# Patient Record
Sex: Male | Born: 1960 | Race: Black or African American | Hispanic: No | Smoking: Current every day smoker
Health system: Southern US, Community
[De-identification: ages and names within clinical notes are randomized; demographics above are authoritative.]

## PROBLEM LIST (undated history)

## (undated) DIAGNOSIS — G959 Disease of spinal cord, unspecified: Secondary | ICD-10-CM

## (undated) DIAGNOSIS — R945 Abnormal results of liver function studies: Secondary | ICD-10-CM

## (undated) DIAGNOSIS — K859 Acute pancreatitis without necrosis or infection, unspecified: Secondary | ICD-10-CM

## (undated) DIAGNOSIS — N281 Cyst of kidney, acquired: Secondary | ICD-10-CM

## (undated) DIAGNOSIS — K824 Cholesterolosis of gallbladder: Secondary | ICD-10-CM

## (undated) DIAGNOSIS — R4020 Unspecified coma: Secondary | ICD-10-CM

## (undated) DIAGNOSIS — F1010 Alcohol abuse, uncomplicated: Secondary | ICD-10-CM

## (undated) DIAGNOSIS — R7989 Other specified abnormal findings of blood chemistry: Secondary | ICD-10-CM

## (undated) HISTORY — DX: Abnormal results of liver function studies: R94.5

## (undated) HISTORY — DX: Other specified abnormal findings of blood chemistry: R79.89

## (undated) HISTORY — PX: NO PAST SURGERIES: SHX2092

---

## 1998-06-02 ENCOUNTER — Emergency Department (HOSPITAL_COMMUNITY): Admission: EM | Admit: 1998-06-02 | Discharge: 1998-06-02 | Payer: Self-pay | Admitting: Emergency Medicine

## 1998-06-02 ENCOUNTER — Encounter: Payer: Self-pay | Admitting: Emergency Medicine

## 1999-12-15 ENCOUNTER — Encounter: Payer: Self-pay | Admitting: Emergency Medicine

## 1999-12-15 ENCOUNTER — Emergency Department (HOSPITAL_COMMUNITY): Admission: EM | Admit: 1999-12-15 | Discharge: 1999-12-15 | Payer: Self-pay | Admitting: Emergency Medicine

## 2004-04-25 ENCOUNTER — Emergency Department (HOSPITAL_COMMUNITY): Admission: EM | Admit: 2004-04-25 | Discharge: 2004-04-25 | Payer: Self-pay | Admitting: Family Medicine

## 2004-06-22 ENCOUNTER — Emergency Department (HOSPITAL_COMMUNITY): Admission: EM | Admit: 2004-06-22 | Discharge: 2004-06-22 | Payer: Self-pay

## 2005-07-18 ENCOUNTER — Emergency Department (HOSPITAL_COMMUNITY): Admission: EM | Admit: 2005-07-18 | Discharge: 2005-07-18 | Payer: Self-pay | Admitting: Emergency Medicine

## 2007-04-27 ENCOUNTER — Inpatient Hospital Stay (HOSPITAL_COMMUNITY): Admission: EM | Admit: 2007-04-27 | Discharge: 2007-04-30 | Payer: Self-pay | Admitting: Emergency Medicine

## 2007-12-22 ENCOUNTER — Inpatient Hospital Stay (HOSPITAL_COMMUNITY): Admission: EM | Admit: 2007-12-22 | Discharge: 2007-12-25 | Payer: Self-pay | Admitting: Emergency Medicine

## 2007-12-22 ENCOUNTER — Ambulatory Visit: Payer: Self-pay | Admitting: Internal Medicine

## 2009-07-01 ENCOUNTER — Inpatient Hospital Stay (HOSPITAL_COMMUNITY): Admission: EM | Admit: 2009-07-01 | Discharge: 2009-07-02 | Payer: Self-pay | Admitting: Emergency Medicine

## 2009-07-04 ENCOUNTER — Emergency Department (HOSPITAL_COMMUNITY): Admission: EM | Admit: 2009-07-04 | Discharge: 2009-07-05 | Payer: Self-pay | Admitting: Emergency Medicine

## 2010-01-25 ENCOUNTER — Emergency Department (HOSPITAL_COMMUNITY): Admission: EM | Admit: 2010-01-25 | Discharge: 2010-01-25 | Payer: Self-pay | Admitting: Emergency Medicine

## 2010-02-19 ENCOUNTER — Emergency Department (HOSPITAL_COMMUNITY): Admission: EM | Admit: 2010-02-19 | Discharge: 2010-02-20 | Payer: Self-pay | Admitting: Emergency Medicine

## 2010-07-12 ENCOUNTER — Inpatient Hospital Stay (HOSPITAL_COMMUNITY): Admission: EM | Admit: 2010-07-12 | Discharge: 2010-07-14 | Payer: Self-pay | Admitting: Emergency Medicine

## 2010-07-12 DIAGNOSIS — K859 Acute pancreatitis without necrosis or infection, unspecified: Secondary | ICD-10-CM

## 2010-07-12 DIAGNOSIS — F1010 Alcohol abuse, uncomplicated: Secondary | ICD-10-CM

## 2010-07-12 DIAGNOSIS — E871 Hypo-osmolality and hyponatremia: Secondary | ICD-10-CM | POA: Insufficient documentation

## 2010-07-13 LAB — CONVERTED CEMR LAB
Calcium: 8.7 mg/dL
Creatinine, Ser: 0.61 mg/dL
HDL: 56 mg/dL
Hemoglobin: 13.2 g/dL
LDL Cholesterol: 61 mg/dL
Platelets: 238 10*3/uL
RDW: 12.9 %
Total CHOL/HDL Ratio: 2.3
Triglycerides: 56 mg/dL
VLDL: 11 mg/dL
WBC: 5.3 10*3/uL

## 2010-07-14 ENCOUNTER — Encounter (INDEPENDENT_AMBULATORY_CARE_PROVIDER_SITE_OTHER): Payer: Self-pay | Admitting: Nurse Practitioner

## 2010-07-28 ENCOUNTER — Ambulatory Visit: Payer: Self-pay | Admitting: Nurse Practitioner

## 2010-07-28 DIAGNOSIS — G589 Mononeuropathy, unspecified: Secondary | ICD-10-CM | POA: Insufficient documentation

## 2010-07-28 DIAGNOSIS — F17200 Nicotine dependence, unspecified, uncomplicated: Secondary | ICD-10-CM | POA: Insufficient documentation

## 2010-07-28 DIAGNOSIS — E876 Hypokalemia: Secondary | ICD-10-CM

## 2010-07-28 DIAGNOSIS — F1410 Cocaine abuse, uncomplicated: Secondary | ICD-10-CM | POA: Insufficient documentation

## 2010-07-28 LAB — CONVERTED CEMR LAB
ALT: 19 units/L (ref 0–53)
Amylase: 73 units/L (ref 0–105)
BUN: 14 mg/dL (ref 6–23)
Cholesterol, target level: 200 mg/dL
Creatinine, Ser: 0.92 mg/dL (ref 0.40–1.50)
HDL goal, serum: 40 mg/dL
LDL Goal: 130 mg/dL
Lipase: 90 units/L — ABNORMAL HIGH (ref 0–75)
PSA: 0.36 ng/mL (ref 0.10–4.00)
Sodium: 140 meq/L (ref 135–145)
Total Bilirubin: 0.5 mg/dL (ref 0.3–1.2)

## 2010-08-02 ENCOUNTER — Encounter (INDEPENDENT_AMBULATORY_CARE_PROVIDER_SITE_OTHER): Payer: Self-pay | Admitting: Nurse Practitioner

## 2010-11-09 NOTE — Letter (Signed)
Summary: Discharge Summary  Discharge Summary   Imported By: Arta Bruce 07/20/2010 12:23:10  _____________________________________________________________________  External Attachment:    Type:   Image     Comment:   External Document

## 2010-11-09 NOTE — Letter (Signed)
Summary: Handout Printed  Printed Handout:  - Pancreatitis, Acute 

## 2010-11-09 NOTE — Letter (Signed)
Summary: *HSN Results Follow up  [facility]  [address] [place], [place] [address]   Phone: [phone]  Fax: [phone]      08/02/2010   [name] [address] [place], [place]  [address]   Dear  [name],                            ____S.Drinkard,FNP   ____D. Gore,FNP       ____B. McPherson,MD   ____V. Rankins,MD    ____E. Mulberry,MD    __X__N. Daphine Deutscher, FNP  ____D. Reche Dixon, MD    ____K. Philipp Deputy, MD    ____Other     This letter is to inform you that your recent test(s):  _______Pap Smear    ___X____Lab Test     _______X-ray     Comments:  Labs done during recent office visit show that your lipase is still just a little elevated.  However this is much improved from when you were in the hospital.         _________________________________________________________ If you have any questions, please contact our office [phone].                    Sincerely,    Lehman Prom FNP [facility]

## 2010-11-09 NOTE — Letter (Signed)
Summary: PT INFORMATION SHEET  PT INFORMATION SHEET   Imported By: Arta Bruce 07/29/2010 10:03:16  _____________________________________________________________________  External Attachment:    Type:   Image     Comment:   External Document

## 2010-11-09 NOTE — Assessment & Plan Note (Signed)
Summary: NEW - Establish Care   Vital Signs:  Patient profile:   50 year old male Height:      65.50 inches Weight:      129.7 pounds BMI:     21.33 Temp:     97.1 degrees F oral Pulse rate:   72 / minute Pulse rhythm:   regular Resp:     16 per minute BP sitting:   110 / 70  (left arm) Cuff size:   regular  Vitals Entered By: Levon Hedger (July 28, 2010 2:28 PM) CC: follow-up visit [place], Lipid Management Is Patient Diabetic? No Pain Assessment Patient in pain? no       Does patient need assistance? Functional Status Self care Ambulation Normal   CC:  follow-up visit [place] and Lipid Management.  History of Present Illness:  Pt into the office to establish care. No previous [facility]  No signficant PMH No PSH  Recent hospitalization from 07/12/2010 to 07/14/2010 [facility] notes reviewed) Hx of pancreatitis and ETOH abuse who presented to [facility] with abdominal pain 2 weeks prior to admission.  Workup in ED revealed lipase of 1625 and decreased 464.  Pt presents today with medications from the hospital  1.  Acute pancreatitis secondary to ETOH consumption/dependency - pt was admitted and started on IVF, clear liquids and pain medications.  Lipase trended down.  Pt was able to tolerate a regular diet at time of discharge  2.  Hypokalemia and Hyponatremia - probably secondary to alcohol consumption.  pt was hydrated and repleted  3.  ETOH dependence - pt was monitored for delirium and withdrawl.  He was placed on thiamine, MVI and ativan protocal.  Pt did not go through withdrawls.  4. Cocaine use - Urine drug screen positive for cocaine use.    Lipid Management History:      Positive NCEP/ATP III risk factors include male age 65 years old or older and current tobacco user.     Habits & Providers  Alcohol-Tobacco-Diet     Alcohol drinks/day: 2     Alcohol Counseling: to decrease amount and/or frequency of alcohol intake     Alcohol type: beer  Tobacco Status: current     Tobacco Counseling: to quit use of tobacco products     Cigarette Packs/Day: 1/2 pack a day     Year Started: age 52  Exercise-Depression-Behavior     Drug Use: cocaine  Comments: ETOH has decreased since hospitalization on 07/12/2010  Current Medications (verified): 1)  Folic Acid 1 Mg Tabs (Folic Acid) .... Take One Tablet By Mouth Every Day *ripudeep Rap,md 2)  Promethazine Hcl 12.5 Mg Tabs (Promethazine Hcl) .... Take One Tablet By Mouth Every 6 Hours As Needed *ripudeep Rai, Md 3)  Thiamine Hcl 100 Mg Tabs (Thiamine Hcl) .... Take One Tablet By Mouth Everyday *ripudeep Rai, Md 4)  Oxycodone-Acetaminophen 5-325 Mg Tabs (Oxycodone-Acetaminophen) .... Take One Tablet By Mouth Every 6 Hours As Needed *ripudeep Rai,md 5)  Lorazepam 1 Mg Tabs (Lorazepam) .... Take One Tablet By Mouth Every 6 Hours As Needed For Anxiety *ripudeep Rai, Md 6)  Px Complete Senior Multivits  Tabs (Multiple Vitamins-Minerals) .... Take One Tablet By Mouth Every Day  Allergies (verified): No Known Drug Allergies  Family History: mother - diabetes father -  (deceased from MI) brother - diabetes  Social History: 2 children tobacco - 1/2ppd ETOH - 2 beers per day Drug - hx cocaine useSmoking Status:  current Packs/Day:  1/2 pack  a day Drug Use:  cocaine  Review of Systems General:  Denies fever. CV:  Denies chest pain or discomfort. Resp:  Denies cough. GI:  Denies abdominal pain, nausea, and vomiting. Neuro:  Complains of tingling; hands and feet.  Physical Exam  General:  alert.   Head:  normocephalic.   Eyes:  glasses Lungs:  normal breath sounds.   Heart:  normal rate and regular rhythm.   Abdomen:  non-tender and normal bowel sounds.     Impression & Recommendations:  Problem # 1:  Hosp for ACUTE PANCREATITIS (ICD-577.0) will check amylase and lipase today pt advised of the dx and why he should decrease his drinking  Problem # 2:  ALCOHOL ABUSE  (ICD-305.00) advised pt to decrease drinking as this is what causes pancreatitis to reoccur Orders: T-Amylase (16606-30160) T-Lipase (10932-35573) T-PSA (22025-42706)  Problem # 3:  TOBACCO ABUSE (ICD-305.1) advised cessation  Problem # 4:  NEED PROPHYLACTIC VACCINATION&INOCULATION FLU (ICD-V04.81) will given today  Problem # 5:  NEUROPATHY (ICD-355.9) likely from chronic ETOH abuse  Complete Medication List: 1)  Folic Acid 1 Mg Tabs (Folic acid) .... Take one tablet by mouth every day *ripudeep rap,md 2)  Thiamine Hcl 100 Mg Tabs (Thiamine hcl) .... Take one tablet by mouth everyday *ripudeep rai, md 3)  Px Complete Senior Multivits Tabs (Multiple vitamins-minerals) .... Take one tablet by mouth every day  Other Orders: T-Comprehensive Metabolic Panel (23762-83151) Flu Vaccine 83yrs + (76160) Admin 1st Vaccine (73710)  Lipid Assessment/Plan:      Based on NCEP/ATP III, the patient's risk factor category is "0-1 risk factors".  The patient's lipid goals are as follows: Total cholesterol goal is 200; LDL cholesterol goal is 130; HDL cholesterol goal is 40; Triglyceride goal is 150.    Patient Instructions: 1)  You have been given the flu vaccine today. 2)  You will be notified of any abnormal lab results. 3)  Keep up your efforts to limit your alcohol use.  This is the cause for lots of your chronic issues including numbness and tingling in your arms and legs. 4)  Be sure to get an "orange" card so you can continue to be seen here 5)  Continue to take your medications as ordered - vitamins 6)  Follow up as needed   Orders Added: 1)  Est. Patient Level III [62694] 2)  T-Comprehensive Metabolic Panel [80053-22900] 3)  T-Amylase [82150-23210] 4)  T-Lipase [83690-23215] 5)  T-PSA [85462-70350] 6)  Flu Vaccine 82yrs + [90658] 7)  Admin 1st Vaccine [90471]   Immunizations Administered:  Influenza Vaccine # 1:    Vaccine Type: Fluvax 3+    Site: right deltoid    Mfr:  [email or web address]    Dose: 0.5 ml    Route: IM    Given by: Levon Hedger    Exp. Date: 04/09/2011    Lot #: [id number]    VIS given: 05/04/10 version given July 28, 2010.  Flu Vaccine Consent Questions:    Do you have a history of severe allergic reactions to this vaccine? no    Any prior history of allergic reactions to egg and/or gelatin? no    Do you have a sensitivity to the preservative Thimersol? no    Do you have a past history of Guillan-Barre Syndrome? no    Do you currently have an acute febrile illness? no    Have you ever had a severe reaction to latex? no    Vaccine information given and explained to  patient? yes ndc  [phone]  Immunizations Administered:  Influenza Vaccine # 1:    Vaccine Type: Fluvax 3+    Site: right deltoid    Mfr: [email or web address]    Dose: 0.5 ml    Route: IM    Given by: Levon Hedger    Exp. Date: 04/09/2011    Lot #: [id number]    VIS given: 05/04/10 version given July 28, 2010.  Prevention & Chronic Care Immunizations   Influenza vaccine: Fluvax 3+  (07/28/2010)    Tetanus booster: Not documented    Pneumococcal vaccine: Not documented  Colorectal Screening   Hemoccult: Not documented    Colonoscopy: Not documented  Other Screening   PSA: Not documented   PSA ordered.   Smoking status: current  (07/28/2010)  Lipids   Total Cholesterol: 128  (07/13/2010)   LDL: 61  (07/13/2010)   LDL Direct: Not documented   HDL: 56  (07/13/2010)   Triglycerides: 56  (07/13/2010)   Nursing Instructions: Give Flu vaccine today

## 2010-12-23 LAB — BASIC METABOLIC PANEL
BUN: 3 mg/dL — ABNORMAL LOW (ref 6–23)
Creatinine, Ser: 0.61 mg/dL (ref 0.4–1.5)

## 2010-12-23 LAB — RAPID URINE DRUG SCREEN, HOSP PERFORMED
Amphetamines: NOT DETECTED
Barbiturates: NOT DETECTED
Benzodiazepines: NOT DETECTED
Tetrahydrocannabinol: NOT DETECTED

## 2010-12-23 LAB — LIPID PANEL
Cholesterol: 128 mg/dL (ref 0–200)
LDL Cholesterol: 61 mg/dL (ref 0–99)
Triglycerides: 56 mg/dL (ref <150)

## 2010-12-23 LAB — DIFFERENTIAL
Basophils Relative: 0 % (ref 0–1)
Eosinophils Absolute: 0 10*3/uL (ref 0.0–0.7)
Eosinophils Relative: 0 % (ref 0–5)
Lymphocytes Relative: 15 % (ref 12–46)
Lymphs Abs: 1.2 10*3/uL (ref 0.7–4.0)
Monocytes Absolute: 0.8 10*3/uL (ref 0.1–1.0)
Monocytes Relative: 10 % (ref 3–12)
Neutro Abs: 5.9 10*3/uL (ref 1.7–7.7)
Neutrophils Relative %: 75 % (ref 43–77)

## 2010-12-23 LAB — CBC
HCT: 40.2 % (ref 39.0–52.0)
Hemoglobin: 14.2 g/dL (ref 13.0–17.0)
MCH: 33 pg (ref 26.0–34.0)
MCHC: 34.9 g/dL (ref 30.0–36.0)
MCV: 93.5 fL (ref 78.0–100.0)
Platelets: 238 10*3/uL (ref 150–400)
RBC: 4.3 MIL/uL (ref 4.22–5.81)

## 2010-12-23 LAB — LIPASE, BLOOD: Lipase: 464 U/L — ABNORMAL HIGH (ref 11–59)

## 2010-12-23 LAB — COMPREHENSIVE METABOLIC PANEL
AST: 38 U/L — ABNORMAL HIGH (ref 0–37)
Albumin: 3.9 g/dL (ref 3.5–5.2)
Alkaline Phosphatase: 44 U/L (ref 39–117)
CO2: 23 mEq/L (ref 19–32)
Calcium: 8.6 mg/dL (ref 8.4–10.5)
Sodium: 128 mEq/L — ABNORMAL LOW (ref 135–145)

## 2010-12-28 LAB — CBC
Hemoglobin: 15.1 g/dL (ref 13.0–17.0)
MCHC: 35.2 g/dL (ref 30.0–36.0)
MCV: 93.5 fL (ref 78.0–100.0)
RBC: 4.6 MIL/uL (ref 4.22–5.81)

## 2010-12-28 LAB — COMPREHENSIVE METABOLIC PANEL
AST: 36 U/L (ref 0–37)
CO2: 24 mEq/L (ref 19–32)
Creatinine, Ser: 0.7 mg/dL (ref 0.4–1.5)
GFR calc Af Amer: >60 mL/min (ref >60)
Glucose, Bld: 110 mg/dL — ABNORMAL HIGH (ref 70–99)
Potassium: 3.9 mEq/L (ref 3.5–5.1)
Total Protein: 8.1 g/dL (ref 6.0–8.3)

## 2010-12-28 LAB — POCT I-STAT, CHEM 8
BUN: 7 mg/dL (ref 6–23)
Calcium, Ion: 1.11 mmol/L — ABNORMAL LOW (ref 1.12–1.32)
Creatinine, Ser: 0.9 mg/dL (ref 0.4–1.5)
Hemoglobin: 15.6 g/dL (ref 13.0–17.0)
Sodium: 141 mEq/L (ref 135–145)
TCO2: 25 mmol/L (ref 0–100)

## 2010-12-28 LAB — DIFFERENTIAL
Eosinophils Absolute: 0 10*3/uL (ref 0.0–0.7)
Eosinophils Relative: 1 % (ref 0–5)
Lymphs Abs: 2.4 10*3/uL (ref 0.7–4.0)
Monocytes Absolute: 0.5 10*3/uL (ref 0.1–1.0)

## 2011-01-14 LAB — DIFFERENTIAL
Basophils Absolute: 0 10*3/uL (ref 0.0–0.1)
Basophils Absolute: 0 10*3/uL (ref 0.0–0.1)
Basophils Relative: 1 % (ref 0–1)
Basophils Relative: 1 % (ref 0–1)
Eosinophils Absolute: 0 10*3/uL (ref 0.0–0.7)
Eosinophils Absolute: 0.1 10*3/uL (ref 0.0–0.7)
Eosinophils Relative: 0 % (ref 0–5)
Lymphocytes Relative: 12 % (ref 12–46)
Lymphs Abs: 0.8 10*3/uL (ref 0.7–4.0)
Monocytes Absolute: 0.4 10*3/uL (ref 0.1–1.0)
Monocytes Absolute: 0.6 10*3/uL (ref 0.1–1.0)
Monocytes Absolute: 0.7 10*3/uL (ref 0.1–1.0)
Monocytes Relative: 11 % (ref 3–12)
Monocytes Relative: 13 % — ABNORMAL HIGH (ref 3–12)
Neutro Abs: 5 10*3/uL (ref 1.7–7.7)
Neutrophils Relative %: 57 % (ref 43–77)
Neutrophils Relative %: 73 % (ref 43–77)

## 2011-01-14 LAB — URINE MICROSCOPIC-ADD ON

## 2011-01-14 LAB — RAPID URINE DRUG SCREEN, HOSP PERFORMED
Barbiturates: NOT DETECTED
Cocaine: NOT DETECTED
Cocaine: POSITIVE — AB
Opiates: NOT DETECTED
Tetrahydrocannabinol: NOT DETECTED

## 2011-01-14 LAB — BASIC METABOLIC PANEL
BUN: 3 mg/dL — ABNORMAL LOW (ref 6–23)
CO2: 22 mEq/L (ref 19–32)
CO2: 27 mEq/L (ref 19–32)
Calcium: 9.1 mg/dL (ref 8.4–10.5)
Chloride: 105 mEq/L (ref 96–112)
Chloride: 106 mEq/L (ref 96–112)
Creatinine, Ser: 0.64 mg/dL (ref 0.4–1.5)
Creatinine, Ser: 0.77 mg/dL (ref 0.4–1.5)
GFR calc Af Amer: >60 mL/min (ref >60)
GFR calc Af Amer: >60 mL/min (ref >60)
GFR calc Af Amer: >60 mL/min (ref >60)
GFR calc non Af Amer: >60 mL/min (ref >60)
Glucose, Bld: 115 mg/dL — ABNORMAL HIGH (ref 70–99)
Glucose, Bld: 98 mg/dL (ref 70–99)
Potassium: 3.4 mEq/L — ABNORMAL LOW (ref 3.5–5.1)
Sodium: 136 mEq/L (ref 135–145)

## 2011-01-14 LAB — URINALYSIS, ROUTINE W REFLEX MICROSCOPIC
Bilirubin Urine: NEGATIVE
Glucose, UA: NEGATIVE mg/dL
Hgb urine dipstick: NEGATIVE
Ketones, ur: 40 mg/dL — AB
Ketones, ur: NEGATIVE mg/dL
Nitrite: NEGATIVE
Specific Gravity, Urine: 1.021 (ref 1.005–1.030)
Specific Gravity, Urine: 1.025 (ref 1.005–1.030)
Urobilinogen, UA: 0.2 mg/dL (ref 0.0–1.0)
pH: 5 (ref 5.0–8.0)
pH: 5.5 (ref 5.0–8.0)

## 2011-01-14 LAB — CBC
HCT: 39 % (ref 39.0–52.0)
Hemoglobin: 12.6 g/dL — ABNORMAL LOW (ref 13.0–17.0)
Hemoglobin: 12.8 g/dL — ABNORMAL LOW (ref 13.0–17.0)
MCHC: 34.9 g/dL (ref 30.0–36.0)
MCHC: 35.2 g/dL (ref 30.0–36.0)
MCHC: 35.5 g/dL (ref 30.0–36.0)
MCV: 97 fL (ref 78.0–100.0)
MCV: 97.3 fL (ref 78.0–100.0)
MCV: 97.6 fL (ref 78.0–100.0)
Platelets: 174 10*3/uL (ref 150–400)
RBC: 3.63 MIL/uL — ABNORMAL LOW (ref 4.22–5.81)
RBC: 3.75 MIL/uL — ABNORMAL LOW (ref 4.22–5.81)
RBC: 3.78 MIL/uL — ABNORMAL LOW (ref 4.22–5.81)
RDW: 13.5 % (ref 11.5–15.5)
RDW: 13.5 % (ref 11.5–15.5)
WBC: 4.9 10*3/uL (ref 4.0–10.5)
WBC: 6.6 10*3/uL (ref 4.0–10.5)

## 2011-01-14 LAB — TROPONIN I
Troponin I: 0.01 ng/mL (ref 0.00–0.06)
Troponin I: 0.01 ng/mL (ref 0.00–0.06)
Troponin I: 0.02 ng/mL (ref 0.00–0.06)
Troponin I: 0.03 ng/mL (ref 0.00–0.06)

## 2011-01-14 LAB — POCT CARDIAC MARKERS
CKMB, poc: 4.2 ng/mL (ref 1.0–8.0)
Troponin i, poc: <0.05 ng/mL (ref 0.00–0.09)

## 2011-01-14 LAB — COMPREHENSIVE METABOLIC PANEL
AST: 91 U/L — ABNORMAL HIGH (ref 0–37)
Albumin: 4.1 g/dL (ref 3.5–5.2)
BUN: 9 mg/dL (ref 6–23)
Calcium: 9.6 mg/dL (ref 8.4–10.5)
Chloride: 102 mEq/L (ref 96–112)
Creatinine, Ser: 0.72 mg/dL (ref 0.4–1.5)
GFR calc Af Amer: >60 mL/min (ref >60)
Total Bilirubin: 1.5 mg/dL — ABNORMAL HIGH (ref 0.3–1.2)
Total Protein: 7.5 g/dL (ref 6.0–8.3)

## 2011-01-14 LAB — PROTIME-INR: INR: 0.9 (ref 0.00–1.49)

## 2011-01-14 LAB — PHOSPHORUS: Phosphorus: 3 mg/dL (ref 2.3–4.6)

## 2011-01-14 LAB — CK TOTAL AND CKMB (NOT AT ARMC)
CK, MB: 3.5 ng/mL (ref 0.3–4.0)
Relative Index: 0.8 (ref 0.0–2.5)
Relative Index: 0.9 (ref 0.0–2.5)
Total CK: 297 U/L — ABNORMAL HIGH (ref 7–232)

## 2011-01-14 LAB — APTT: aPTT: 29 seconds (ref 24–37)

## 2011-01-14 LAB — MAGNESIUM: Magnesium: 1.8 mg/dL (ref 1.5–2.5)

## 2011-01-14 LAB — LIPASE, BLOOD: Lipase: 66 U/L — ABNORMAL HIGH (ref 11–59)

## 2011-02-22 NOTE — Consult Note (Signed)
NAME:  Samuel [name], Samuel [name] NO.:  [id number]   MEDICAL RECORD NO.:  [id number]          PATIENT TYPE:  INP   LOCATION:  5125                         FACILITY:  [facility]   PHYSICIAN:  Lennie Muckle, MD      DATE OF BIRTH:  [birth year]   DATE OF CONSULTATION:  12/24/2007  DATE OF DISCHARGE:                                 CONSULTATION   TIME OF CONSULTATION:  1315.   REQUESTING PHYSICIAN:  Joaquin Courts, MD, with the medical teaching  service team B.   PRIMARY CARE PHYSICIAN:  None.   REASON FOR CONSULTATION:  Pancreatitis and gallbladder polyps.   HISTORY OF PRESENT ILLNESS:  Samuel [name] is a 46-year male patient with  known polysubstance abuse involving regular use of alcohol and tobacco.  He was admitted on December 22, 2007, after several days of epigastric  pain, very severe, associated with nausea.  He presented to the ER.  His  LFTs were normal with a total bilirubin of 0.5, alkaline phosphatase 48,  AST 13, ALT 20.  White count was 11,300.  He was having significant  epigastric pain.  Ultrasound done on December 24, 2007, revealed multiple  tiny nonshadowing foci along the gallbladder wall consistent with  numerous polyps.  No definite gallstones seen.  There was no common bile  duct dilatation.  No gallbladder wall thickening.  His initial lipase  was 2027 at admission and since admission the patient's white count has  decreased to 8500. last checked on March 15.  Triglycerides are normal  at 60.  Because of the ultrasound findings of gallbladder polyps,  internal medicine wanted evaluation for possible biliary etiology to the  pancreatitis.  The patient was hospitalized in July 2008 for problems  related to pancreatitis and associated alcohol abuse.   REVIEW OF SYSTEMS:  As per the history present of illness.  GI:  In  addition, the patient had similar symptoms in July 2008 with the  epigastric pain with that presentation and between these episodes, the  patient  has had no GI symptoms that he can recall such as reflux,  similar or recurrent epigastric pain, no postprandial pain in the  epigastric or right upper quadrant region.  Otherwise, all review of  systems is negative upon examination.   FAMILY MEDICAL HISTORY:  Noncontributory.   SOCIAL HISTORY:  Significant for alcohol and tobacco.  He admits to at  least two 40-ounce beers daily and at least four one time a week on the  weekends.   PAST MEDICAL HISTORY:  1. Alcoholic abuse, regular use.  2. Seizure disorder.  This is presumed secondary to alcohol withdrawal      symptoms since the patient is not on medications.  3. Pancreatitis, at least second episode within the past 12 months.  4. Tobacco abuse.   PAST SURGICAL HISTORY:  None.   ALLERGIES:  No known drug allergies.   MEDICATIONS:  The patient does not take any regular prescribed  medications at home, although he does take Alka-Seltzer, aspirin and  other over-the-counter medications as needed.   Medications while hospitalized  include Protonix IV, thiamine IV, folic  acid IV, nicotine patch.  He has received pneumococcal as well as  influenza vaccine.  He has been receiving Dilaudid for pain.  His  potassium has been repleted orally and he is on IV fluids.   PHYSICAL EXAM:  CONSTITUTIONAL GENERAL:  A pleasant male patient  complaining of recent severe epigastric pain, still somewhat tender but  marked improvement in symptoms since admission.  VITAL SIGNS:  Temperature 989, BP 122/73, pulse 59 and regular,  respirations 19.  EYES:  Sclerae are noninjected.  Conjunctivae are pink.  Pupils are  equal and react to light.  EAR, NOSE, MOUTH AND THROAT:  Ears are symmetrical in appearance without  lesions or deformity.  Nose is without rhinorrhea.  Mouth is pink and  moist.  NECK:  Trachea is midline.  Thyroid is nonpalpable.  There are no  masses.  Respiratory effort is normal.  Bilateral lung sounds are clear to   auscultation.  He is on room air.  CARDIOVASCULAR:  Heart sounds are S1 and S2 without rubs, murmurs,  thrills or gallops.  No JVD noted.  No peripheral edema.  Pulses are 2+  bilaterally, carotid, radial and pedal.  Chest or breast exam was deferred.  GI:  Abdomen is soft, essentially nontender except over the epigastrium.  No tenderness in the right upper quadrant or the left upper quadrant.  Bowel sounds are present in all four quadrants, normoactive.  LYMPHATIC:  Exam was deferred.  MUSCULOSKELETAL:  Extremities are symmetrical in appearance without  clubbing or cyanosis.  No joint effusion or erythema.  SKIN:  Normal appearance without any obvious rashes, lesions or masses.  NEUROLOGIC:  Cranial nerves II-XII are grossly intact.  Sensation in the  upper and lower extremities is grossly intact.  The patient is  ambulatory.  PSYCHIATRIC:  The patient is oriented to person, place, time and  situation.  His affect is appropriate to the situation.   LAB:  Sodium 136, potassium 3.4, CO2 23, glucose 89, BUN 1, creatinine  0.69.  LFTs are normal and have not been repeated since admission.  PTT  is 42, PT 13.7, INR 1.0.  White count on admission was 11,300, yesterday  was down to 8500, hemoglobin 13.1, platelets 184,000.  Lipase on  admission was 2027, has not been repeated but the patient has been  started on a diet with no apparent upper GI symptoms.   DIAGNOSTICS:  Ultrasound as noted.   IMPRESSION:  1. Pancreatitis, findings consistent with alcoholic etiology,      symptoms, resolving.  2. Biliary polyps, currently asymptomatic.  3. Polysubstance abuse involving alcohol and tobacco.   PLAN:  1. The patient appears be tolerating a diet without any GI symptoms      and pancreatitis appears be clinically resolving.  2. Do not feel at this time that the pancreatitis is from a biliary      etiology, more likely alcohol-related.  The patient does not have      any GI or biliary colic  symptoms in between attacks.  His LFTs and      common bile duct were normal on the ultrasound today and there are      no definite stones seen and he does have gallbladder polyps but,      again, he has no GI symptoms in between, no postprandial symptoms,      which means this is less likely any discomfort from a biliary polyp  etiology.  Small gallbladder polyps can be followed by repeat      ultrasound in approximately 6 months.  Usually these do not require      resection unless enlarging.  3. Patient counseled on alcohol cessation and to watch to see if he in      the future develops any GI symptoms that potentially could indicate      symptomatic polyps such as postprandial pain or nausea or      indigestion.  If the patient does develop these symptoms given his      history of alcohol use and use of Alka-Seltzer and other aspirin      products, it would be prudent to send the patient to GI for      evaluation for possible endoscopy first to rule out post problems      as gastritis, esophagitis or ulcer as the etiology of the upper GI      symptoms before pursuing gallbladder polyps as etiology and      potential surgical resection.     ______________________________  Denny Peon. Ulice Brilliant, D.P.M.      Lennie Muckle, MD  Electronically Signed    CMD/MEDQ  D:  12/24/2007  T:  12/24/2007  Job:  [id number]   cc:   Joaquin Courts, MD

## 2011-02-22 NOTE — H&P (Signed)
NAME:  Samuel [name], Samuel [name] NO.:  [id number]   MEDICAL RECORD NO.:  [id number]          PATIENT TYPE:  EMS   LOCATION:  MAJO                         FACILITY:  [facility]   PHYSICIAN:  Marcellus Scott, MD     DATE OF BIRTH:  1961/05/14   DATE OF ADMISSION:  04/27/2007  DATE OF DISCHARGE:                              HISTORY & PHYSICAL   CHIEF COMPLAINT:  Abdominal pain.   HISTORY OF PRESENT ILLNESS:  The patient is a pleasant, 50 year old  African-American male patient with no past medical history who was in  his usual state of health until approximately 4 days ago. Since 4 days  he has noted a gradual onset of sharp, mid-abdominal pain which is  intermittent, 9/10 at its worst, radiating to all quadrants of the  abdomen with no aggravating or relieving factors and associated with 2  episodes of vomiting since it all began. The vomitus did not contain any  coffee-grounds or blood. The patient has had regular BM(s) with soft  brown stools. The patient has, however, taken a laxative yesterday and  also has used Alka-Seltzer Plus.  The patient has also used over the  counter pain medications such as Advil, Tylenol and aspirin. The  patient's appetite, he says is good. He had episodes of feeling hot and  cold. Because the pain was not getting better, the patient has presented  to the emergency room where further evaluation has revealed an elevated  lipase of 660. Thereby, the patient is being admitted for further  evaluation and management.   PAST MEDICAL HISTORY:  None.   PAST SURGICAL HISTORY:  None.   PSYCHIATRIC HISTORY:  None.   ALLERGIES:  THERE ARE NO KNOWN DRUG ALLERGIES.   MEDICATIONS:  No prescription medications. However, the patient has  taken aspirin, Advil, Tylenol, ibuprofen, Alka-Seltzer Plus and a  laxative.   FAMILY HISTORY:  The patient's  mother and brother with history of  diabetes.   SOCIAL HISTORY:  The patient is married and his spouse is by  the  bedside. He is a Visual merchandiser. He has a 32-pack-year smoking history and a  current smoker. The patient consumes two 40-ounce beers daily and twice  a week he consumes four 40-ounce beers. This he has been doing for the  last 10 years. The CAGE questionnaire is positive. The patient rarely  uses hard liquor. The patient denies history of using any other drugs of  recreational use.   REVIEW OF SYSTEMS:  Over 10 systems reviewed and pertinent for  questionable weight loss but otherwise noncontributory.   PHYSICAL EXAMINATION:  GENERAL APPEARANCE: The patient is a moderately-  built African-American male patient in no obvious distress.  VITAL SIGNS: Temperature 97 degrees Fahrenheit, blood pressure 122/79  mmHg, pulse 60 per minute, respirations 16 per minute and saturating at  99% on room air.  HEENT: Normocephalic, atraumatic. Hair is braided. The patient has a  beard. Pupils are equally reacting to light and accommodation. Oral  cavity with a carious tooth on the left side. There is no oropharyngeal  erythema or thrush.  NECK:  Without JVD, carotid bruit, lymphadenopathy or goiter; supple.  GENERAL: No lymphadenopathy.  RESPIRATORY: clear to auscultation bilaterally.  CARDIOVASCULAR: first and second heart sounds heard, no third or fourth  heart sounds, murmurs, rubs, gallops or clicks.  ABDOMEN: not distended. There is tenderness mainly in the periumbilical  or the mid quadrant. However, minimal tenderness in the left quadrants  too. There is no rigidity, guarding or rebound. No organomegaly or mass  palpable. Bowel sounds are normally heard.  CENTRAL NERVOUS SYSTEM: patient is awake, alert, oriented x3 with no  focal neurological deficits.  EXTREMITIES: clubbed fingers. No cyanosis or edema. Peripheral pulses  are symmetrically felt.  SKIN: The skin is without any rashes.   LABORATORY DATA:  Lipase of 662. Blood alcohol level less than 5.  Complete comprehensive metabolic panel  with sodium of 133, potassium  3.1, glucose 164, BUN 6, creatinine 0.69. Hepatic panel remarkable for a  total bilirubin of 1.7, alkaline phosphatase of 47, AST 58, ALT 33,  albumin 3.7. CBC is with a hemoglobin of 12.5, hematocrit of 36, white  blood cells 7.4, platelets of 139.   Urine, microscopy negative for feature of urinary tract infection.   ASSESSMENT AND PLAN:  1. Acute pancreatitis -- probably secondary to alcohol abuse -- will      admit the patient to regular medical floor. Will make the patient      nil per oral except sips and ice chips. Will hydrate the patient      with IV fluids and provide pain medications. Will monitor serial      daily lipases. Will also check fasting lipids.  2. Alcohol dependence/abuse. Will monitor for delirium tremens. Will      provide thiamine, multivitamins, and place the patient on an Ativan      alcohol withdrawal protocol.  3. Hypokalemia. The patient has already received a dose of oral      potassium supplements. Will repeat that and will follow up the      patient's  basic metabolic panel and magnesium.  4. Hyperglycemia, questionable for newly-diagnosed, type 2 diabetes.      Will check hemoglobin A1c and place the patient on sliding-scale      NovoLog insulin.  5. Thrombocytopenia, probably secondary to problem #2. Will follow up      the patient's  CBCs and avoid heparin.  6. Tobacco abuse, for cessation counseling and nicotine patch.  7. Hyponatremia, secondary to alcohol abuse and dehydration - for      normal saline hydration.  8. Abnormal LFTs secondary to problem #2; to follow up hepatic panel.  9. Dental caries, for outpatient dental referral.  10.Anemia, which is mild. Probably will drop further with hydration.      Follow CBC's      Marcellus Scott, MD  Electronically Signed     AH/MEDQ  D:  04/27/2007  T:  04/27/2007  Job:  [id number]

## 2011-02-22 NOTE — Discharge Summary (Signed)
NAME:  Samuel [name], Samuel [name] NO.:  [id number]   MEDICAL RECORD NO.:  [id number]          PATIENT TYPE:  INP   LOCATION:  5729                         FACILITY:  [facility]   PHYSICIAN:  Hillery Aldo, M.D.   DATE OF BIRTH:  Nov 01, 1960   DATE OF ADMISSION:  04/27/2007  DATE OF DISCHARGE:  04/30/2007                               DISCHARGE SUMMARY   PRIMARY CARE PHYSICIAN:  None.   DISCHARGE DIAGNOSES:  1. Acute pancreatitis.  2. Alcohol abuse.  3. Hypokalemia, resolved.  4. Hyperglycemia, resolved.  5. Thrombocytopenia, resolved.  6. Tobacco abuse.  7. Hyponatremia, resolved.  8. Transaminitis/alcohol to alcoholic hepatitis, resolved.  9. Normocytic anemia of chronic disease.  10.Dental caries.  11.Ileus.   DISCHARGE MEDICATIONS:  Zantac 150 mg daily.   CONSULTATIONS:  None.   PROCEDURES AND DIAGNOSTIC STUDIES.:  1. Acute abdominal series on April 27, 2007, showed left lower lobe      atelectasis and an ileus.   BRIEF ADMISSION HISTORY OF PRESENT ILLNESS:  The patient is a 50-year-  old male with alcohol abuse who presented to the hospital for evaluation  secondary to abdominal pain.  On initial evaluation in the emergency  department, he was noted to have a lipase of 660 with active nausea and  vomiting and therefore was admitted for stabilization and evaluation.   DISCHARGE LABORATORY VALUES:  Sodium was 135, potassium 3.7, chloride  103, bicarb 24, BUN 5, creatinine 0.59, glucose 111.  White blood cell  count was 5.9, hemoglobin 10.8, hematocrit 30.8, platelets 165.  Lipase  was 111.   HOSPITAL COURSE:  #1 - ABDOMINAL PAIN SECONDARY TO ACUTE PANCREATITIS:  The patient's elevated lipase supported a diagnosis of acute  pancreatitis.  The patient had no evidence of cholecystitis and his  alkaline phosphatase levels were never elevated.  His pancreatitis was  thought to be secondary to alcohol abuse.  He was initially kept n.p.o.  and put on pain medications  p.r.n..  Received IV fluid rehydration as  well.  His abdominal pain gradually improved and his diet was advanced.  At this point, he is tolerating a regular diet and has not had any  complaints of abdominal pain or nausea for the past 24 hours.  He will  be discharged on a bland diet with instructions to limit his p.o. intake  for the next 2-3 days and to return to the emergency department if he  should develop any reoccurrence of his abdominal pain, nausea or  vomiting.   #2 - HYPOKALEMIA: The patient was appropriately repleted and potassium  level at discharge 3.7.   #3 - ALCOHOL ABUSE: The patient was put on an Ativan taper as per the  CIWA protocol.  He did not experience any delirium tremors.  He was  provided with the number of alcohol drug services on discharge for help  with quitting.  He was advised to discontinue alcohol intake.   #3 - HYPERGLYCEMIA: The patient's hyperglycemia was felt to be secondary  to his acute pancreatitis.  His sugars normalized over the course of his  hospitalization.   #  4 - THROMBOCYTOPENIA: The patient's thrombocytopenia was thought to be  secondary to alcohol abuse.  It was resolved on the time of discharge.   #5 - TOBACCO ABUSE: The patient was kept on nicotine patch.  He was  counseled on the importance of tobacco cessation.   #6 - HYPONATREMIA: The patient's hyponatremia was thought to be  secondary to alcoholism.  It was resolved on discharge.   #7 - TRANSAMINITIS/ALCOHOLIC HEPATITIS:  The patient's transaminases  normalized by discharge.   #8 - NORMOCYTIC ANEMIA: The patient did have an anemia panel done.  His  iron was 24 with a total iron binding capacity of 175, 14% saturation.  His ferritin was found to be 313.  These findings are consistent with  anemia of chronic disease.  His B12 and serum folate levels were within  normal limits.  Stools were guaiac and were negative x2.   #9 - DENTAL CARIES:  The patient was advised to follow  up with a  dentist.   DISPOSITION:  The patient is stable for discharge home.  He was provided  with number for [facility] to follow up with them by  obtaining a primary care physician through their office.      Hillery Aldo, M.D.  Electronically Signed     CR/MEDQ  D:  04/30/2007  T:  04/30/2007  Job:  [id number]

## 2011-02-22 NOTE — Discharge Summary (Signed)
NAME:  Samuel [name], Samuel [name] NO.:  [id number]   MEDICAL RECORD NO.:  [id number]          PATIENT TYPE:  INP   LOCATION:  5125                         FACILITY:  [facility]   PHYSICIAN:  Madaline Guthrie, M.D.    DATE OF BIRTH:  1961/09/05   DATE OF ADMISSION:  12/22/2007  DATE OF DISCHARGE:  12/25/2007                               DISCHARGE SUMMARY   DISCHARGE DIAGNOSES:  1. Acute pancreatitis.  2. Gallbladder polyps.  3. Alcohol use.  4. Hypokalemia.  5. Tobacco dependent.   DISCHARGE MEDICATIONS:  1. Cyomin 1.5 mg p.o. daily.  2. Folic acid 0.4 mg p.o. daily.   DISPOSITION:  The patient is to follow up with Joaquin Courts, M.D. at  [facility] on January 15, 2008, at 9:15 a.m.  The patient  also needs a repeat right upper quadrant ultrasound in six months after  discharge to evaluate gallbladder polyps.   PROCEDURE:  No procedures were performed.   CONSULTATIONS:  Surgery was consulted secondary to gallbladder polyps  and recommended repeat ultrasound of the right upper quadrant in six  months and did not feel like surgical intervention was indicated at this  time.   HISTORY OF PRESENT ILLNESS:  The patient is a 50 year old male with a  past medical history of alcohol abuse and pancreatitis presenting to the  emergency room with significant diffuse abdominal pain.  The pain  started approximately one week ago, but acutely worsened the morning of  admission.  The night before admission, he had two 40 ounce beers.  He  denies any nausea, vomiting, and diarrhea along with fever, chills,  shortness of breath, and chest pain.  He notes that the pain is similar  to what he had in July of 2008.   PHYSICAL EXAMINATION:  VITAL SIGNS:  Temperature 97.7, blood pressure  97/72, pulse 55, respiratory rate 16, O2 saturation 99% on room air.  GENERAL:  The patient is thin, pleasant, and in no distress.  HEENT:  Eyes; anicteric.  ENT; poor dentition.  NECK:   Supple.  LUNGS:  Clear to auscultation bilaterally with good air movement.  CARDIOVASCULAR:  Regular rate and rhythm with no murmurs, rubs, or  gallops.  ABDOMEN:  Good bowel sounds, soft, moderately tender to palpation  globally with voluntary guarding in the entire abdomen without rebound.  No hepatosplenomegaly.  EXTREMITIES:  Digital clubbing present and no cyanosis or edema.   ADMISSION LABS:  BMET; sodium 133, potassium 3.7, chloride 101, bicarb  24, BUN 5, creatinine 0.69, glucose 137, bilirubin 0.5, alkaline  phosphatase 98, AST 30, ALT 20, total protein 7.5, albumin 3.8, calcium  9.0. White count 11.3 with absolute neutrophil count of 9.2, hemoglobin  15.1 with MCV of 95, platelets 193.  Alcohol level less than 5.  Lipase  2027.  Urinalysis significant only for 30 of protein.   DIAGNOSTIC IMAGING:  Acute abdominal series without any acute cardiac or  pulmonary disease and a normal bowel gas pattern with no free air.   Right upper quadrant ultrasound showed multiple gallbladder polyps  without any evidence of gallstones,  negative for biliary dilatation and  pancreas is mildly hypoechoic diffusely and slightly prominent which is  consistent with pancreatitis and a simple cyst was noted in the lower  pole of the left kidney.   HOSPITAL COURSE:  Problem 1.  Acute pancreatitis.  Patient complained of  severe epigastric pain and his lipase was greater than 2,000 and this  was all consistent with acute pancreatitis.  This was felt to be  secondary to alcohol and the patient was counseled extensively on  quitting completely alcohol.  However, given the presence of gallbladder  polyps, surgery was consulted to evaluate the need for cholecystectomy  and they felt like it was safe to follow up with a right upper quadrant  ultrasound in six months to evaluate gallbladder polyps to make sure  they are not getting any larger.  The patient was kept NPO and given  intravenous pain  medicine.  The patient's diet was advanced and he was  switched to p.o. pain medicine and did not require any pain medicine at  the time of discharge.   Problem 2.  Gallbladder polyps.  Surgery was consulted and felt like no  surgical intervention was indicated at this time and recommended a  follow-up ultrasound in six months to evaluate polyps.   Problem 3.  Alcohol use.  The patient was started on thiamine and folic  acid and put on the CIWA protocol.  He did not demonstrate any evidence  of withdrawal during this hospitalization and extensive counseling on  cessation was provided.   Problem 4.  Hypokalemia.  The patient's potassium was 3.3 and he was  provided p.o. supplementation without any problems.   Problem 5.  Tobacco dependence.  The patient was counseled on tobacco  cessation.   DISCHARGE VITALS:  Temperature 97.6, blood pressure 98/74, pulse 72,  respiratory rate 20, O2 saturation 99% on room air.      Joaquin Courts, MD  Electronically Signed      Madaline Guthrie, M.D.  Electronically Signed    VW/MEDQ  D:  12/25/2007  T:  12/26/2007  Job:  [id number]

## 2011-05-17 IMAGING — CR DG ABDOMEN ACUTE W/ 1V CHEST
3 series · 3 of 3 positions shown · non-contrast
Comparison: Chest x-ray 07/01/2009

CLINICAL DATA: Abdominal pain.  History of pancreatitis.

ACUTE ABDOMEN SERIES (ABDOMEN 2 VIEW & CHEST 1 VIEW)

[w chest pa]
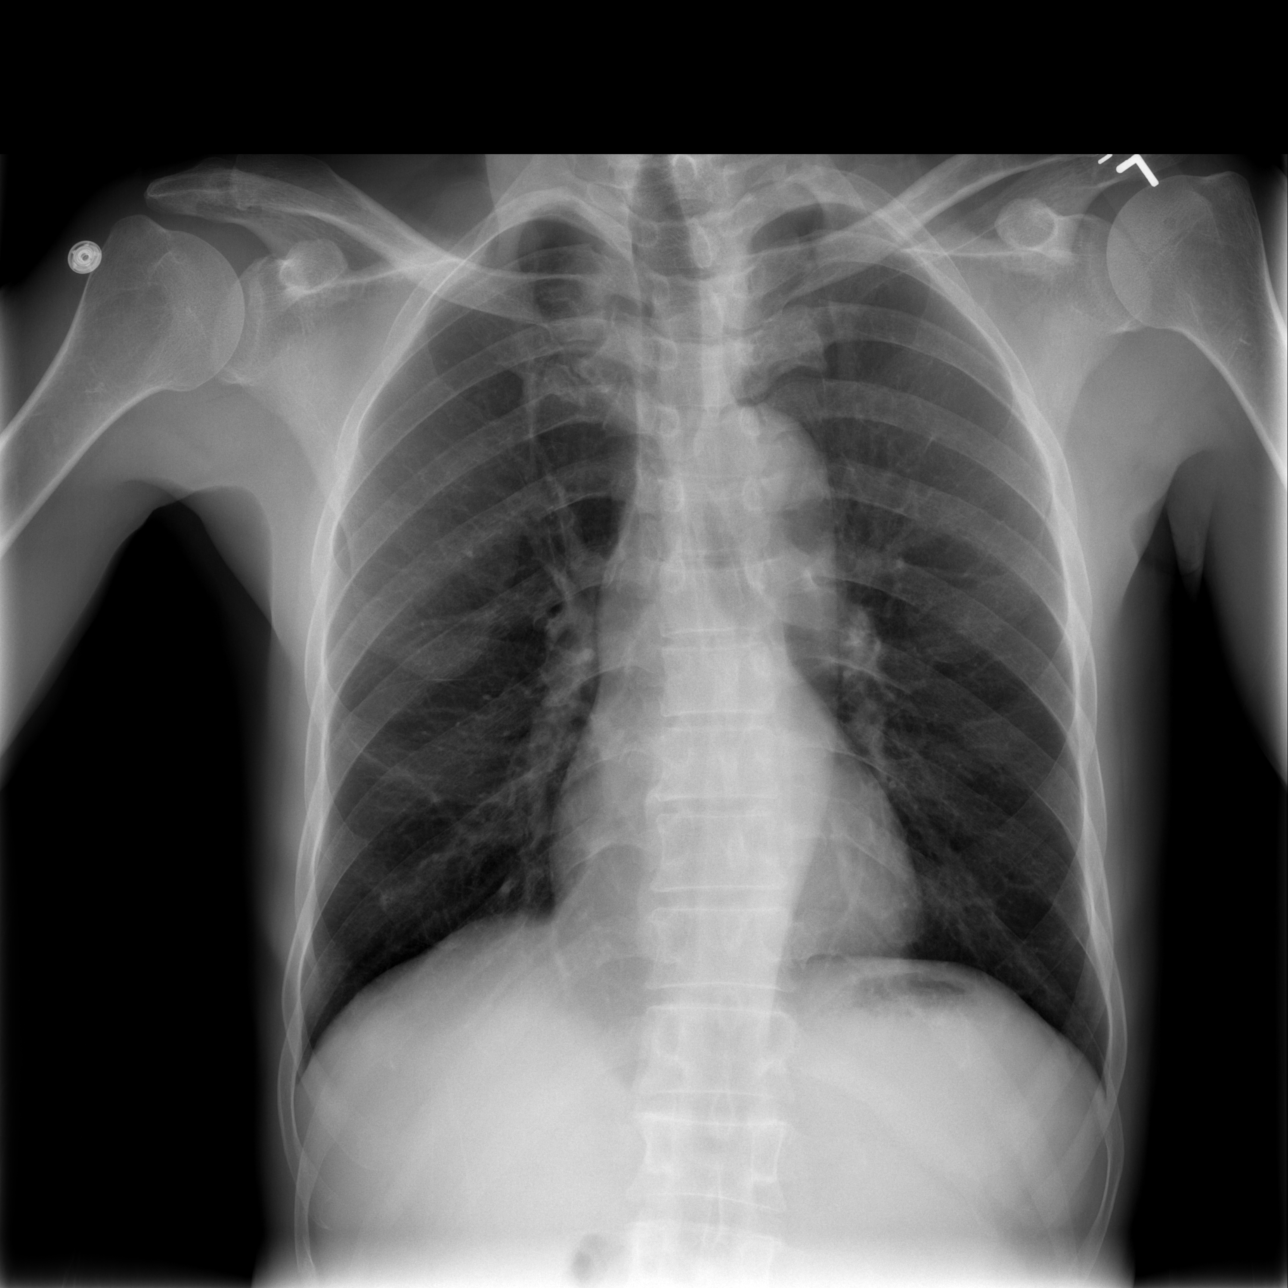

[w abdomen upright]
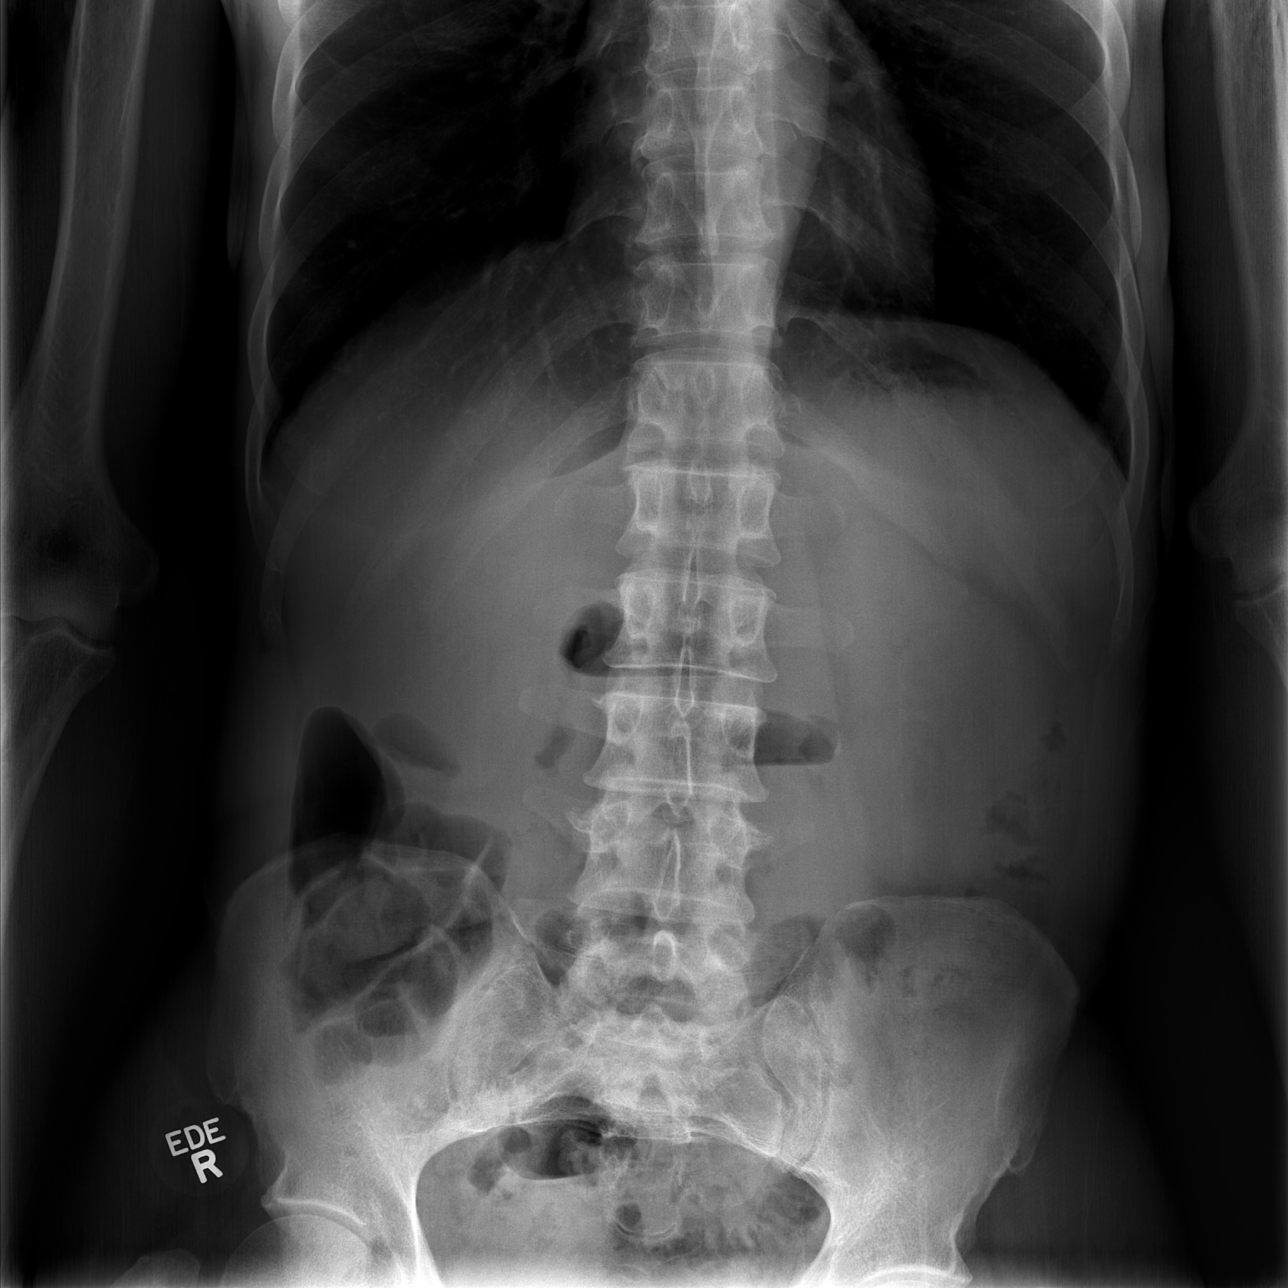

[t abdomen supine]
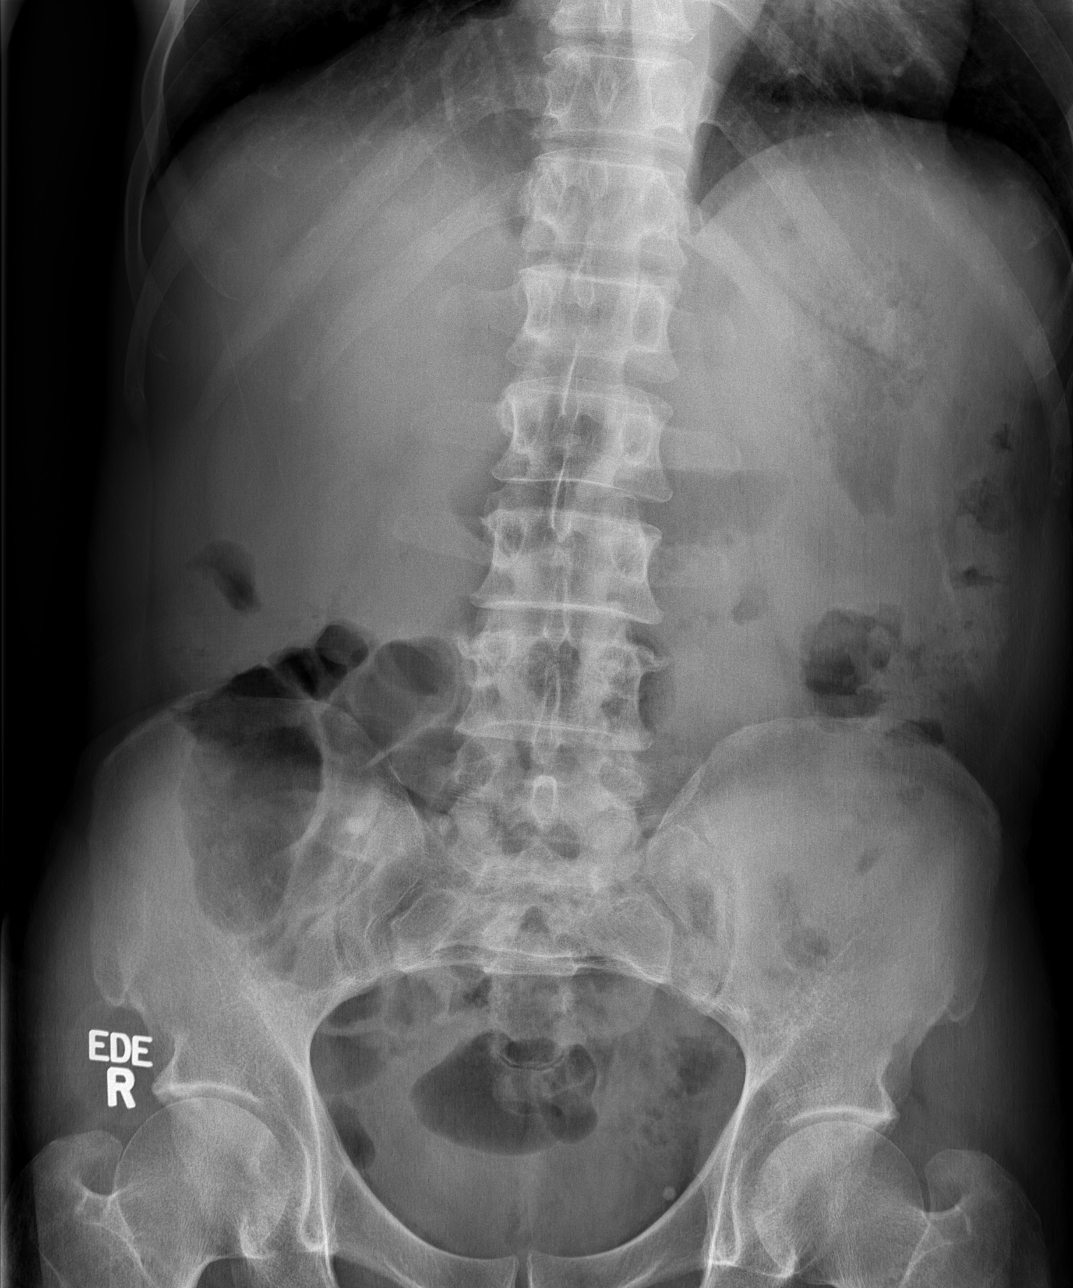

[3 of 3 positions shown; findings below may reference images not displayed]

FINDINGS: The upright chest x-ray demonstrates no acute
cardiopulmonary findings.

Two views of the abdomen demonstrate a nonspecific bowel gas
pattern.  There is scattered air and stool in the colon along with
nondilated air filled small bowel loops.  The soft tissue shadows
of the abdomen are grossly maintained.  No free air.  The bony
structures are intact.
IMPRESSION: 1.  No acute cardiopulmonary findings.
2.  Nonobstructive bowel gas pattern.

## 2011-05-17 IMAGING — US US ABDOMEN COMPLETE
1 series · 13 of 25 positions shown · non-contrast
Comparison: 12/23/2007 and CT dated 07/01/2009.

CLINICAL DATA: Abdominal pain.  Pancreatitis.

COMPLETE ABDOMINAL ULTRASOUND

[Series 1: us abdomen complete · 0.26mm/px · 13 of 95 slices shown]
[im 1/95]
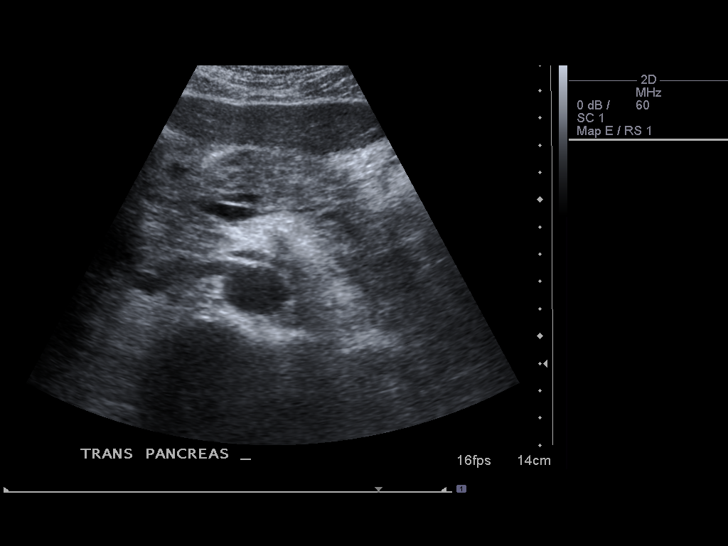
[im 8/95]
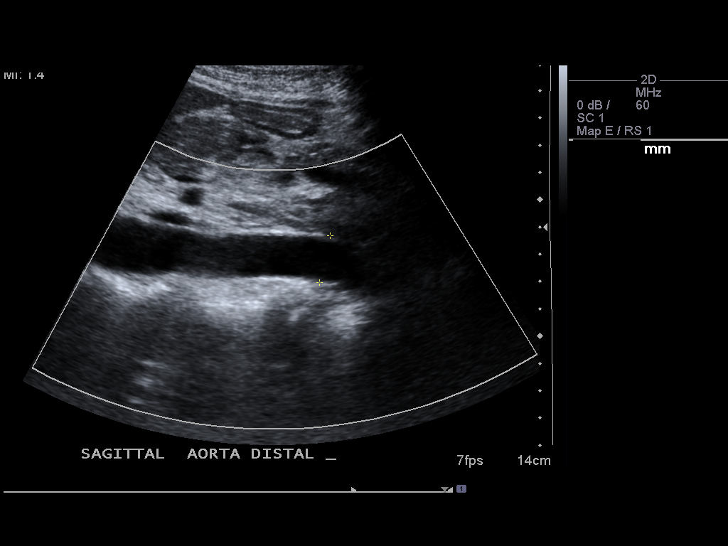
[im 16/95]
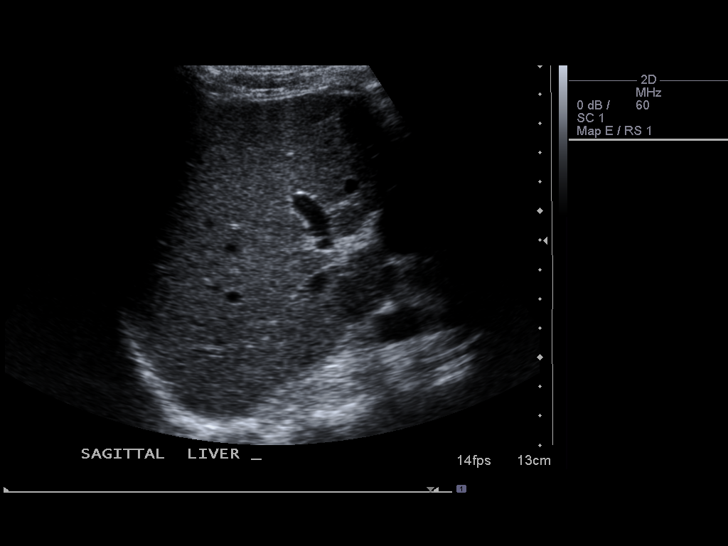
[im 24/95]
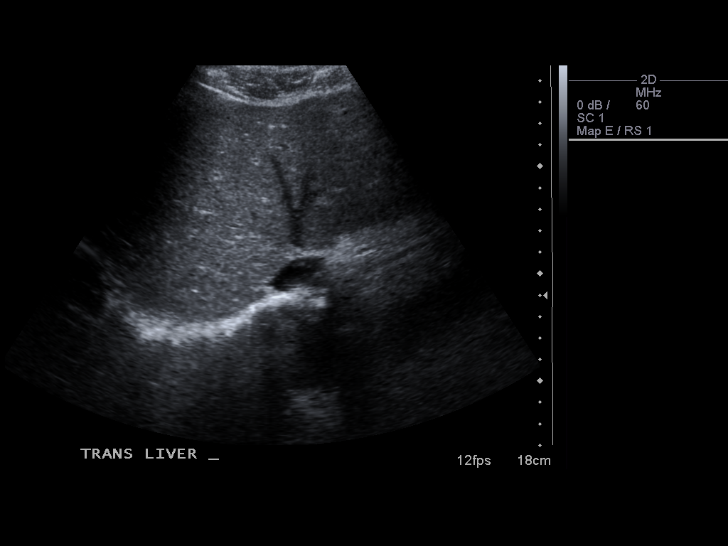
[im 32/95]
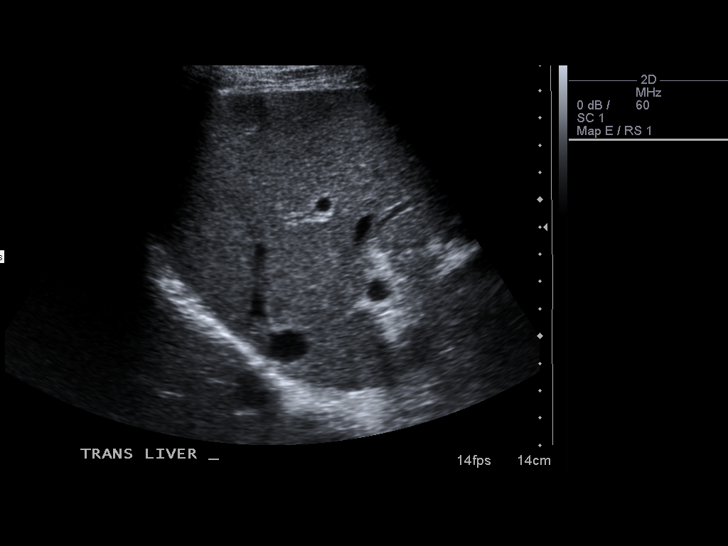
[im 40/95]
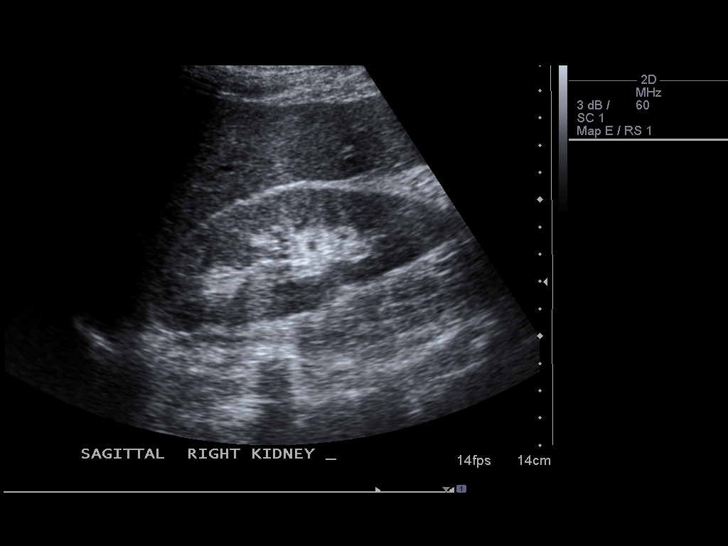
[im 48/95]
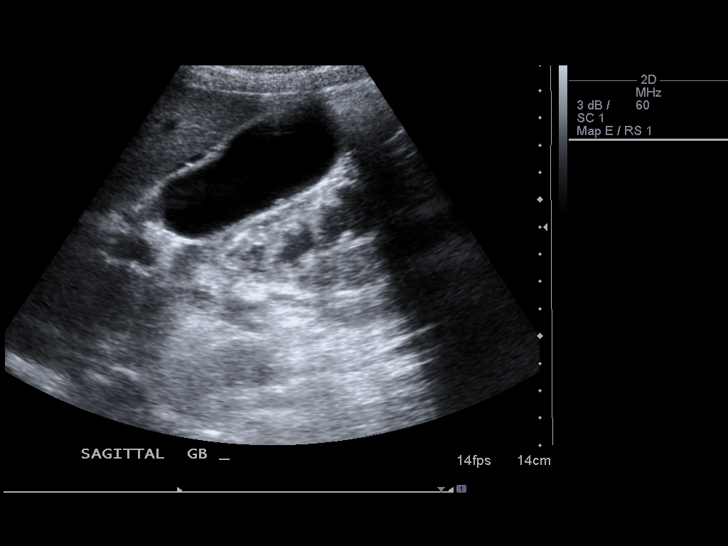
[im 55/95]
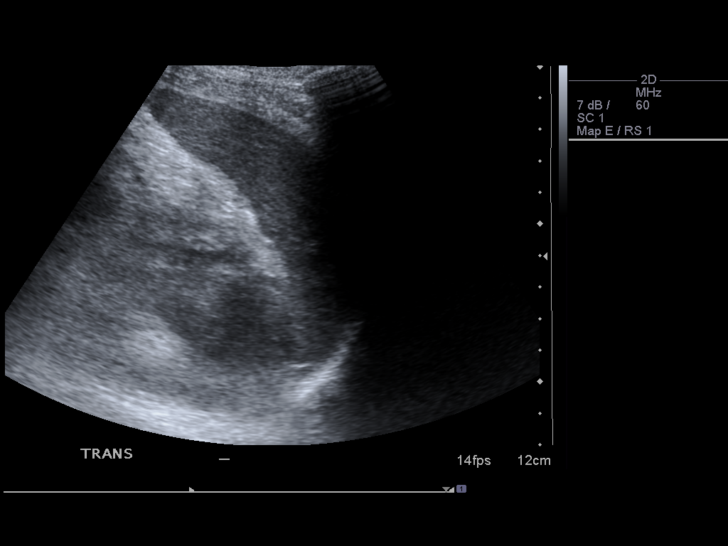
[im 63/95]
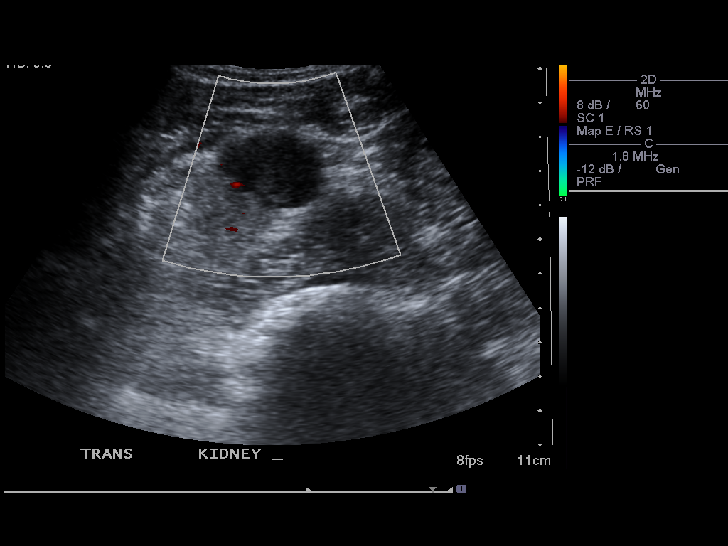
[im 71/95]
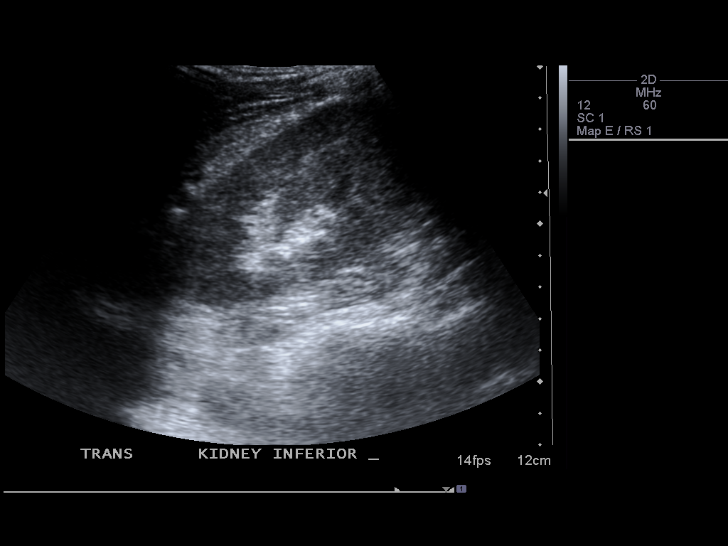
[im 79/95]
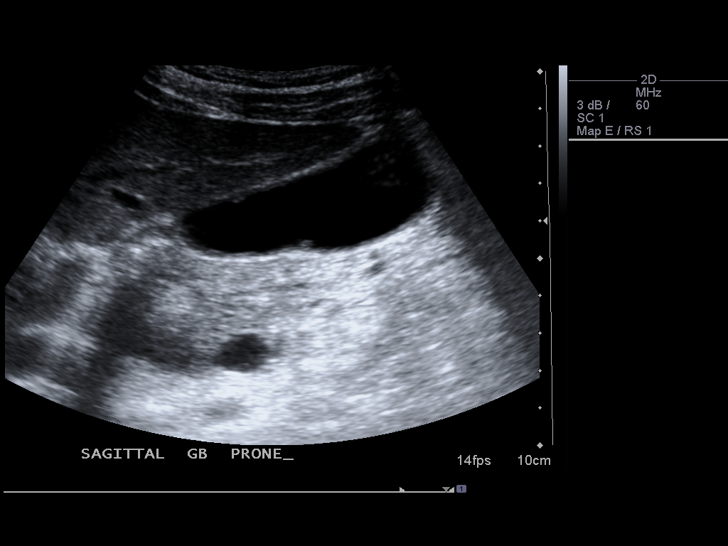
[im 87/95]
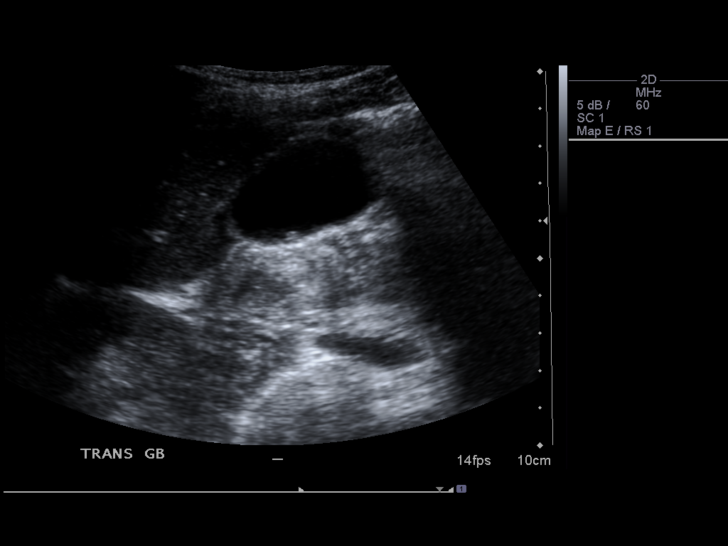
[im 95/95]
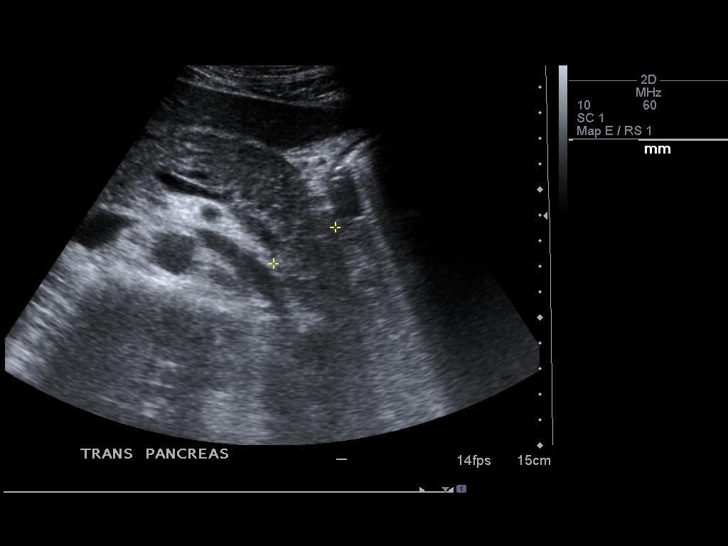

[13 of 25 positions shown; findings below may reference images not displayed]

FINDINGS: Gallbladder:  Small polyps.  No gallstones or wall thickening.
Minimal pericholecystic fluid.  The patient was not tender over the
gallbladder.

Common bile duct:  Normal, measuring 5.3 mm in diameter proximally.

Liver:  Normal.

IVC:  Normal.

Pancreas:  Mildly enlarged and hypo with a echoic, unchanged.

Spleen:  Normal, measuring 6.4 cm in length.

Right Kidney:  Normal, measuring 11.6 cm in length.

Left Kidney:  3.5 x 3.4 x 2.6 cm cyst with low-level internal
echoes in the lower pole.  No internal blood flow with color
Doppler. This measured 2.9 cm in maximum diameter on the CT on
07/01/2009 and 2.2 cm in maximum diameter on the ultrasound on
12/23/2007, simple at that time.

Abdominal aorta:  Normal in caliber.

No free peritoneal fluid seen other than the minimal
pericholecystic fluid.
IMPRESSION: 1.  Stable mildly enlarged and hypoechoic pancreas, compatible with
the history of pancreatitis.
2.  Minimal pericholecystic fluid, most likely related to the
patient's pancreatitis.
3.  Stable small gallbladder polyps without gallstones.
4.  Increased size of a left renal cyst with interval internal
echoes.

## 2011-05-26 ENCOUNTER — Emergency Department (HOSPITAL_COMMUNITY)
Admission: EM | Admit: 2011-05-26 | Discharge: 2011-05-26 | Disposition: A | Discharge status: Home / Self Care | Payer: Self-pay | Attending: Emergency Medicine | Admitting: Emergency Medicine

## 2011-05-26 DIAGNOSIS — K859 Acute pancreatitis without necrosis or infection, unspecified: Secondary | ICD-10-CM | POA: Insufficient documentation

## 2011-05-26 DIAGNOSIS — I498 Other specified cardiac arrhythmias: Secondary | ICD-10-CM | POA: Insufficient documentation

## 2011-05-26 DIAGNOSIS — F1010 Alcohol abuse, uncomplicated: Secondary | ICD-10-CM | POA: Insufficient documentation

## 2011-05-26 DIAGNOSIS — G40909 Epilepsy, unspecified, not intractable, without status epilepticus: Secondary | ICD-10-CM | POA: Insufficient documentation

## 2011-05-26 DIAGNOSIS — R12 Heartburn: Secondary | ICD-10-CM | POA: Insufficient documentation

## 2011-05-26 DIAGNOSIS — R1013 Epigastric pain: Secondary | ICD-10-CM | POA: Insufficient documentation

## 2011-05-26 LAB — DIFFERENTIAL
Eosinophils Absolute: 0 10*3/uL (ref 0.0–0.7)
Eosinophils Relative: 1 % (ref 0–5)
Lymphs Abs: 1.6 10*3/uL (ref 0.7–4.0)
Monocytes Absolute: 1 10*3/uL (ref 0.1–1.0)
Monocytes Relative: 16 % — ABNORMAL HIGH (ref 3–12)

## 2011-05-26 LAB — CBC
MCH: 33.5 pg (ref 26.0–34.0)
MCHC: 35.7 g/dL (ref 30.0–36.0)
MCV: 94 fL (ref 78.0–100.0)
Platelets: 183 10*3/uL (ref 150–400)
RBC: 3.82 MIL/uL — ABNORMAL LOW (ref 4.22–5.81)
RDW: 13.8 % (ref 11.5–15.5)

## 2011-05-26 LAB — URINALYSIS, ROUTINE W REFLEX MICROSCOPIC
Glucose, UA: NEGATIVE mg/dL
Hgb urine dipstick: NEGATIVE
Ketones, ur: 15 mg/dL — AB
Protein, ur: NEGATIVE mg/dL

## 2011-05-26 LAB — RAPID URINE DRUG SCREEN, HOSP PERFORMED
Amphetamines: NOT DETECTED
Opiates: NOT DETECTED

## 2011-05-26 LAB — ETHANOL: Alcohol, Ethyl (B): <11 mg/dL (ref 0–11)

## 2011-05-26 LAB — COMPREHENSIVE METABOLIC PANEL
AST: 69 U/L — ABNORMAL HIGH (ref 0–37)
CO2: 25 mEq/L (ref 19–32)
Calcium: 9.4 mg/dL (ref 8.4–10.5)
Creatinine, Ser: 0.5 mg/dL (ref 0.50–1.35)
GFR calc non Af Amer: >60 mL/min (ref >60)

## 2011-07-04 LAB — BASIC METABOLIC PANEL
BUN: 1 — ABNORMAL LOW
CO2: 23
Calcium: 8.5
Chloride: 107
Creatinine, Ser: 0.69
GFR calc Af Amer: >60
GFR calc non Af Amer: >60
Glucose, Bld: 122 — ABNORMAL HIGH

## 2011-07-04 LAB — CBC
HCT: 43.6
HCT: 37.8 — ABNORMAL LOW
MCV: 95.2
Platelets: 193
Platelets: 184
RBC: 4.58
RDW: 13.8
WBC: 11.3 — ABNORMAL HIGH

## 2011-07-04 LAB — COMPREHENSIVE METABOLIC PANEL
BUN: 5 — ABNORMAL LOW
CO2: 24
Chloride: 101
Creatinine, Ser: 0.69
GFR calc non Af Amer: >60
Glucose, Bld: 137 — ABNORMAL HIGH
Total Bilirubin: 0.5

## 2011-07-04 LAB — DIFFERENTIAL
Basophils Absolute: 0
Lymphocytes Relative: 10 — ABNORMAL LOW
Neutro Abs: 9.2 — ABNORMAL HIGH
Neutrophils Relative %: 82 — ABNORMAL HIGH

## 2011-07-04 LAB — URINALYSIS, ROUTINE W REFLEX MICROSCOPIC
Nitrite: NEGATIVE
Protein, ur: 30 — AB
Specific Gravity, Urine: 1.025
Urobilinogen, UA: 1

## 2011-07-04 LAB — LIPID PANEL
LDL Cholesterol: 81
Triglycerides: 60

## 2011-07-04 LAB — URINE MICROSCOPIC-ADD ON

## 2011-07-04 LAB — PROTIME-INR: INR: 1

## 2011-07-04 LAB — LIPASE, BLOOD: Lipase: 2027 — ABNORMAL HIGH

## 2011-07-25 LAB — LIPASE, BLOOD
Lipase: 111 — ABNORMAL HIGH
Lipase: 662 — ABNORMAL HIGH

## 2011-07-25 LAB — RETICULOCYTES
RBC.: 3.51 — ABNORMAL LOW
Retic Count, Absolute: 77.2

## 2011-07-25 LAB — CBC
HCT: 30.8 — ABNORMAL LOW
HCT: 32.4 — ABNORMAL LOW
HCT: 36 — ABNORMAL LOW
Hemoglobin: 11.3 — ABNORMAL LOW
Hemoglobin: 11.5 — ABNORMAL LOW
Hemoglobin: 12.5 — ABNORMAL LOW
MCHC: 34.6
MCHC: 35
MCV: 94.9
MCV: 95.3
Platelets: 137 — ABNORMAL LOW
Platelets: 165
RBC: 3.26 — ABNORMAL LOW
RBC: 3.4 — ABNORMAL LOW
RBC: 3.49 — ABNORMAL LOW
RBC: 3.8 — ABNORMAL LOW
RDW: 12.9
WBC: 5.7
WBC: 5.9

## 2011-07-25 LAB — COMPREHENSIVE METABOLIC PANEL
ALT: 22
AST: 40 — ABNORMAL HIGH
Albumin: 2.8 — ABNORMAL LOW
Alkaline Phosphatase: 48
Alkaline Phosphatase: 49
BUN: 4 — ABNORMAL LOW
BUN: 6
CO2: 23
CO2: 27
Calcium: 8.3 — ABNORMAL LOW
Calcium: 8.9
Chloride: 104
Chloride: 99
Creatinine, Ser: 0.52
Creatinine, Ser: 0.69
GFR calc Af Amer: >60
GFR calc non Af Amer: >60
GFR calc non Af Amer: >60
Glucose, Bld: 114 — ABNORMAL HIGH
Glucose, Bld: 164 — ABNORMAL HIGH
Glucose, Bld: 94
Potassium: 3.9
Potassium: 4
Sodium: 135
Total Bilirubin: 1
Total Bilirubin: 1.7 — ABNORMAL HIGH
Total Protein: 6.1

## 2011-07-25 LAB — VITAMIN B12: Vitamin B-12: 293 (ref 211–911)

## 2011-07-25 LAB — URINE MICROSCOPIC-ADD ON

## 2011-07-25 LAB — DIFFERENTIAL
Basophils Absolute: 0
Eosinophils Absolute: 0
Lymphocytes Relative: 8 — ABNORMAL LOW
Lymphs Abs: 0.6 — ABNORMAL LOW
Neutrophils Relative %: 83 — ABNORMAL HIGH

## 2011-07-25 LAB — URINALYSIS, ROUTINE W REFLEX MICROSCOPIC
Nitrite: POSITIVE — AB
Specific Gravity, Urine: 1.029
Urobilinogen, UA: 1

## 2011-07-25 LAB — LIPID PANEL
Cholesterol: 120
LDL Cholesterol: 63
Total CHOL/HDL Ratio: 2.6

## 2011-07-25 LAB — HEMOGLOBIN A1C: Hgb A1c MFr Bld: 5.6

## 2011-07-25 LAB — BASIC METABOLIC PANEL
BUN: 5 — ABNORMAL LOW
Chloride: 103
Creatinine, Ser: 0.59
GFR calc Af Amer: >60
GFR calc non Af Amer: >60
Potassium: 3.7

## 2011-07-25 LAB — FOLATE: Folate: 14.5

## 2011-07-25 LAB — MAGNESIUM: Magnesium: 2.1

## 2011-07-25 LAB — IRON AND TIBC
Saturation Ratios: 14 — ABNORMAL LOW
TIBC: 175 — ABNORMAL LOW

## 2011-07-25 LAB — APTT: aPTT: 35

## 2011-07-25 LAB — PHOSPHORUS: Phosphorus: 1.9 — ABNORMAL LOW

## 2011-07-25 LAB — PROTIME-INR
INR: 1
Prothrombin Time: 13.7

## 2013-04-13 ENCOUNTER — Emergency Department (HOSPITAL_COMMUNITY)
Admission: EM | Admit: 2013-04-13 | Discharge: 2013-04-13 | Disposition: A | Discharge status: Home / Self Care | Payer: Self-pay | Attending: Emergency Medicine | Admitting: Emergency Medicine

## 2013-04-13 ENCOUNTER — Encounter (HOSPITAL_COMMUNITY): Payer: Self-pay | Admitting: Emergency Medicine

## 2013-04-13 DIAGNOSIS — R110 Nausea: Secondary | ICD-10-CM | POA: Insufficient documentation

## 2013-04-13 DIAGNOSIS — F17200 Nicotine dependence, unspecified, uncomplicated: Secondary | ICD-10-CM | POA: Insufficient documentation

## 2013-04-13 DIAGNOSIS — K859 Acute pancreatitis without necrosis or infection, unspecified: Secondary | ICD-10-CM | POA: Insufficient documentation

## 2013-04-13 LAB — CBC WITH DIFFERENTIAL/PLATELET
Hemoglobin: 14.1 g/dL (ref 13.0–17.0)
Lymphocytes Relative: 34 % (ref 12–46)
Lymphs Abs: 1.3 10*3/uL (ref 0.7–4.0)
Monocytes Relative: 13 % — ABNORMAL HIGH (ref 3–12)
Neutrophils Relative %: 52 % (ref 43–77)
Platelets: 167 10*3/uL (ref 150–400)
RBC: 4.16 MIL/uL — ABNORMAL LOW (ref 4.22–5.81)
WBC: 3.9 10*3/uL — ABNORMAL LOW (ref 4.0–10.5)

## 2013-04-13 LAB — URINALYSIS, ROUTINE W REFLEX MICROSCOPIC
Bilirubin Urine: NEGATIVE
Ketones, ur: 15 mg/dL — AB
Nitrite: NEGATIVE
Protein, ur: NEGATIVE mg/dL
Urobilinogen, UA: 0.2 mg/dL (ref 0.0–1.0)
pH: 5 (ref 5.0–8.0)

## 2013-04-13 LAB — COMPREHENSIVE METABOLIC PANEL
ALT: 42 U/L (ref 0–53)
Alkaline Phosphatase: 69 U/L (ref 39–117)
CO2: 23 mEq/L (ref 19–32)
Chloride: 101 mEq/L (ref 96–112)
GFR calc Af Amer: >90 mL/min (ref >90)
GFR calc non Af Amer: >90 mL/min (ref >90)
Glucose, Bld: 98 mg/dL (ref 70–99)
Potassium: 3.7 mEq/L (ref 3.5–5.1)
Sodium: 140 mEq/L (ref 135–145)
Total Bilirubin: 0.5 mg/dL (ref 0.3–1.2)

## 2013-04-13 MED ORDER — OXYCODONE-ACETAMINOPHEN 5-325 MG PO TABS
1.0000 | ORAL_TABLET | Freq: Once | ORAL | Status: AC
  Administered 2013-04-13: 1 via ORAL
  Filled 2013-04-13: qty 1

## 2013-04-13 MED ORDER — HYDROMORPHONE HCL PF 1 MG/ML IJ SOLN
1.0000 mg | Freq: Once | INTRAMUSCULAR | Status: AC
  Administered 2013-04-13: 1 mg via INTRAVENOUS
  Filled 2013-04-13: qty 1

## 2013-04-13 MED ORDER — SODIUM CHLORIDE 0.9 % IV SOLN
INTRAVENOUS | Status: DC
  Administered 2013-04-13 (×2): via INTRAVENOUS

## 2013-04-13 MED ORDER — OXYCODONE-ACETAMINOPHEN 5-325 MG PO TABS: 1.0000 | ORAL_TABLET | Freq: Four times a day (QID) | ORAL | Status: DC | PRN

## 2013-04-13 MED ORDER — ONDANSETRON 4 MG PO TBDP: 4.0000 mg | ORAL_TABLET | Freq: Three times a day (TID) | ORAL | Status: DC | PRN

## 2013-04-13 MED ORDER — ONDANSETRON HCL 4 MG/2ML IJ SOLN
4.0000 mg | Freq: Once | INTRAMUSCULAR | Status: AC
  Administered 2013-04-13: 4 mg via INTRAVENOUS
  Filled 2013-04-13: qty 2

## 2013-04-13 NOTE — ED Notes (Signed)
Report to Allan, RN.

## 2013-04-13 NOTE — ED Provider Notes (Signed)
History    CSN: [id number] Arrival date & time 04/13/13  4098  First MD Initiated Contact with Patient 04/13/13 [phone]     Chief Complaint  Patient presents with  . Abdominal Pain   (Consider location/radiation/quality/duration/timing/severity/associated sxs/prior Treatment) HPI Comments: Patient with history of alcoholic pancreatitis presents with worsening upper abdominal pain since this morning. Patient admits to drinking alcohol for the past several days. He denies vomiting and states he has been keeping down fluids. Pain does not radiate. He denies fever, chest pain, shortness of breath, diarrhea, urinary symptoms. No treatments prior to arrival. Onset of symptoms acute. Course is constant. Palpation makes pain worse. Nothing makes it better.  The history is provided by the patient and medical records.   History reviewed. No pertinent past medical history. History reviewed. No pertinent past surgical history. No family history on file. History  Substance Use Topics  . Smoking status: Current Every Day Smoker    Types: Cigarettes  . Smokeless tobacco: Not on file  . Alcohol Use: Yes    Review of Systems  Constitutional: Negative for fever.  HENT: Negative for sore throat and rhinorrhea.   Eyes: Negative for redness.  Respiratory: Negative for cough.   Cardiovascular: Negative for chest pain.  Gastrointestinal: Positive for nausea and abdominal pain. Negative for vomiting and diarrhea.  Genitourinary: Negative for dysuria.  Musculoskeletal: Negative for myalgias.  Skin: Negative for rash.  Neurological: Negative for headaches.    Allergies  Review of patient's allergies indicates not on file.  Home Medications   Current Outpatient Rx  Name  Route  Sig  Dispense  Refill  . ibuprofen (ADVIL,MOTRIN) 200 MG tablet   Oral   Take 400 mg by mouth every 6 (six) hours as needed for pain.         . naproxen sodium (ANAPROX) 220 MG tablet   Oral   Take 440 mg by mouth 2  (two) times daily with a meal.         . ondansetron (ZOFRAN ODT) 4 MG disintegrating tablet   Oral   Take 1 tablet (4 mg total) by mouth every 8 (eight) hours as needed for nausea.   6 tablet   0   . oxyCODONE-acetaminophen (PERCOCET/ROXICET) 5-325 MG per tablet   Oral   Take 1-2 tablets by mouth every 6 (six) hours as needed for pain.   10 tablet   0    BP 122/73  Pulse 51  Temp(Src) 97.5 F (36.4 C) (Oral)  Resp 20  SpO2 98% Physical Exam  Nursing note and vitals reviewed. Constitutional: He appears well-developed and well-nourished.  HENT:  Head: Normocephalic and atraumatic.  Eyes: Conjunctivae are normal. Right eye exhibits no discharge. Left eye exhibits no discharge.  Neck: Normal range of motion. Neck supple.  Cardiovascular: Normal rate, regular rhythm and normal heart sounds.   Pulmonary/Chest: Effort normal and breath sounds normal.  Abdominal: Soft. Bowel sounds are normal. He exhibits no distension. There is tenderness in the epigastric area, periumbilical area and left upper quadrant. There is no rebound, no guarding and no CVA tenderness.    Neurological: He is alert.  Skin: Skin is warm and dry.  Psychiatric: He has a normal mood and affect.    ED Course  Procedures (including critical care time) Labs Reviewed  LIPASE, BLOOD - Abnormal; Notable for the following:    Lipase 417 (*)    All other components within normal limits  CBC WITH DIFFERENTIAL - Abnormal; Notable  for the following:    WBC 3.9 (*)    RBC 4.16 (*)    Monocytes Relative 13 (*)    All other components within normal limits  COMPREHENSIVE METABOLIC PANEL - Abnormal; Notable for the following:    AST 123 (*)    All other components within normal limits  URINALYSIS, ROUTINE W REFLEX MICROSCOPIC   No results found. 1. Pancreatitis     7:29 AM Patient seen and examined. Work-up initiated. Medications ordered.   Vital signs reviewed and are as follows: Filed Vitals:   04/13/13  0707  BP: 122/73  Pulse: 51  Temp: 97.5 F (36.4 C)  Resp: 20   10:21 AM Patient doing well. Pain to 2/10 after dilaudid. Percocet was ordered. Patient given some water which he has had without nausea or vomiting.   Patient d/w and seen by Dr. Radford Pax. Patient wants to go home and appears well. Will discharge.   The patient was urged to return to the Emergency Department immediately with worsening of current symptoms, worsening abdominal pain, persistent vomiting, blood noted in stools, fever, or any other concerns. The patient verbalized understanding.   Patient counseled on use of narcotic pain medications. Counseled not to combine these medications with others containing tylenol. Urged not to drink alcohol, drive, or perform any other activities that requires focus while taking these medications. The patient verbalizes understanding and agrees with the plan.    MDM  Patient with symptoms and history c/w acute pancreatitis 2/2 EtOH. Patient's symptoms are very well controlled. Tolerating fluids. No vomiting. He wants to go home. He is low risk per RANSON criteria. Feel outpt trial is warranted. Return instructions given.   Renne Crigler, PA-C 04/13/13 1024

## 2013-04-13 NOTE — ED Notes (Signed)
Pt. Stated, Pain after waking up in stomach.sharp , no N/V, and I'm thirsty a lot.

## 2013-04-13 NOTE — ED Notes (Signed)
Gave pt Urnial for a sample.8;12am JG.

## 2013-04-13 NOTE — ED Provider Notes (Signed)
Medical screening examination/treatment/procedure(s) were conducted as a shared visit with non-physician practitioner(s) and myself.  I personally evaluated the patient during the encounter   .Face to face Exam:  General:  A&Ox3 HEENT:  Atraumatic Resp:  Normal effort Abd:  Nondistended Neuro:No focal deficits     Nelia Shi, MD 04/13/13 1026

## 2014-05-24 ENCOUNTER — Encounter (HOSPITAL_COMMUNITY): Payer: Self-pay | Admitting: Emergency Medicine

## 2014-05-24 ENCOUNTER — Emergency Department (HOSPITAL_COMMUNITY): Payer: No Typology Code available for payment source

## 2014-05-24 ENCOUNTER — Emergency Department (HOSPITAL_COMMUNITY)
Admission: EM | Admit: 2014-05-24 | Discharge: 2014-05-24 | Disposition: A | Discharge status: Home / Self Care | Payer: No Typology Code available for payment source | Attending: Emergency Medicine | Admitting: Emergency Medicine

## 2014-05-24 DIAGNOSIS — F1010 Alcohol abuse, uncomplicated: Secondary | ICD-10-CM | POA: Insufficient documentation

## 2014-05-24 DIAGNOSIS — S0100XA Unspecified open wound of scalp, initial encounter: Secondary | ICD-10-CM | POA: Insufficient documentation

## 2014-05-24 DIAGNOSIS — S0101XA Laceration without foreign body of scalp, initial encounter: Secondary | ICD-10-CM

## 2014-05-24 DIAGNOSIS — S0003XA Contusion of scalp, initial encounter: Secondary | ICD-10-CM | POA: Diagnosis not present

## 2014-05-24 DIAGNOSIS — Z23 Encounter for immunization: Secondary | ICD-10-CM | POA: Insufficient documentation

## 2014-05-24 DIAGNOSIS — Y9289 Other specified places as the place of occurrence of the external cause: Secondary | ICD-10-CM | POA: Insufficient documentation

## 2014-05-24 DIAGNOSIS — W108XXA Fall (on) (from) other stairs and steps, initial encounter: Secondary | ICD-10-CM | POA: Insufficient documentation

## 2014-05-24 DIAGNOSIS — S1093XA Contusion of unspecified part of neck, initial encounter: Secondary | ICD-10-CM | POA: Diagnosis not present

## 2014-05-24 DIAGNOSIS — F17200 Nicotine dependence, unspecified, uncomplicated: Secondary | ICD-10-CM | POA: Insufficient documentation

## 2014-05-24 DIAGNOSIS — Y9389 Activity, other specified: Secondary | ICD-10-CM | POA: Diagnosis not present

## 2014-05-24 DIAGNOSIS — S0083XA Contusion of other part of head, initial encounter: Secondary | ICD-10-CM | POA: Insufficient documentation

## 2014-05-24 LAB — URINALYSIS, ROUTINE W REFLEX MICROSCOPIC
BILIRUBIN URINE: NEGATIVE
GLUCOSE, UA: NEGATIVE mg/dL
HGB URINE DIPSTICK: NEGATIVE
KETONES UR: NEGATIVE mg/dL
Leukocytes, UA: NEGATIVE
Nitrite: NEGATIVE
PROTEIN: NEGATIVE mg/dL
Specific Gravity, Urine: 1.007 (ref 1.005–1.030)
Urobilinogen, UA: 0.2 mg/dL (ref 0.0–1.0)
pH: 5 (ref 5.0–8.0)

## 2014-05-24 LAB — CBC WITH DIFFERENTIAL/PLATELET
Basophils Absolute: 0 10*3/uL (ref 0.0–0.1)
Basophils Relative: 1 % (ref 0–1)
Eosinophils Absolute: 0 10*3/uL (ref 0.0–0.7)
Eosinophils Relative: 0 % (ref 0–5)
HEMATOCRIT: 41.1 % (ref 39.0–52.0)
Hemoglobin: 14.7 g/dL (ref 13.0–17.0)
LYMPHS ABS: 1.8 10*3/uL (ref 0.7–4.0)
LYMPHS PCT: 38 % (ref 12–46)
MCH: 34 pg (ref 26.0–34.0)
MCHC: 35.8 g/dL (ref 30.0–36.0)
MCV: 95.1 fL (ref 78.0–100.0)
MONO ABS: 0.5 10*3/uL (ref 0.1–1.0)
Monocytes Relative: 12 % (ref 3–12)
NEUTROS ABS: 2.3 10*3/uL (ref 1.7–7.7)
Neutrophils Relative %: 49 % (ref 43–77)
PLATELETS: 167 10*3/uL (ref 150–400)
RBC: 4.32 MIL/uL (ref 4.22–5.81)
RDW: 13.8 % (ref 11.5–15.5)
WBC: 4.7 10*3/uL (ref 4.0–10.5)

## 2014-05-24 LAB — ETHANOL: ALCOHOL ETHYL (B): 385 mg/dL — AB (ref 0–11)

## 2014-05-24 LAB — COMPREHENSIVE METABOLIC PANEL
ALT: 52 U/L (ref 0–53)
AST: 137 U/L — ABNORMAL HIGH (ref 0–37)
Albumin: 4.2 g/dL (ref 3.5–5.2)
Alkaline Phosphatase: 84 U/L (ref 39–117)
Anion gap: 17 — ABNORMAL HIGH (ref 5–15)
BILIRUBIN TOTAL: 0.4 mg/dL (ref 0.3–1.2)
BUN: 5 mg/dL — ABNORMAL LOW (ref 6–23)
CHLORIDE: 99 meq/L (ref 96–112)
CO2: 24 meq/L (ref 19–32)
Calcium: 9.4 mg/dL (ref 8.4–10.5)
Creatinine, Ser: 0.53 mg/dL (ref 0.50–1.35)
GFR calc Af Amer: >90 mL/min (ref >90)
Glucose, Bld: 92 mg/dL (ref 70–99)
Potassium: 4.3 mEq/L (ref 3.7–5.3)
SODIUM: 140 meq/L (ref 137–147)
Total Protein: 8.2 g/dL (ref 6.0–8.3)

## 2014-05-24 MED ORDER — TETANUS-DIPHTH-ACELL PERTUSSIS 5-2.5-18.5 LF-MCG/0.5 IM SUSP
0.5000 mL | Freq: Once | INTRAMUSCULAR | Status: AC
  Administered 2014-05-24: 0.5 mL via INTRAMUSCULAR
  Filled 2014-05-24: qty 0.5

## 2014-05-24 NOTE — Discharge Instructions (Signed)
Concussion °A concussion is a brain injury. It is caused by: °· A hit to the head. °· A quick and sudden movement (jolt) of the head or neck. °A concussion is usually not life threatening. Even so, it can cause serious problems. If you had a concussion before, you may have concussion-like problems after a hit to your head. °HOME CARE °General Instructions °· Follow your doctor's directions carefully. °· Take medicines only as told by your doctor. °· Only take medicines your doctor says are safe. °· Do not drink alcohol until your doctor says it is okay. Alcohol and some drugs can slow down healing. They can also put you at risk for further injury. °· If you are having trouble remembering things, write them down. °· Try to do one thing at a time if you get distracted easily. For example, do not watch TV while making dinner. °· Talk to your family members or close friends when making important decisions. °· Follow up with your doctor as told. °· Watch your symptoms. Tell others to do the same. Serious problems can sometimes happen after a concussion. Older adults are more likely to have these problems. °· Tell your teachers, school nurse, school counselor, coach, athletic trainer, or work manager about your concussion. Tell them about what you can or cannot do. They should watch to see if: °¨ It gets even harder for you to pay attention or concentrate. °¨ It gets even harder for you to remember things or learn new things. °¨ You need more time than normal to finish things. °¨ You become annoyed (irritable) more than before. °¨ You are not able to deal with stress as well. °¨ You have more problems than before. °· Rest. Make sure you: °¨ Get plenty of sleep at night. °¨ Go to sleep early. °¨ Go to bed at the same time every day. Try to wake up at the same time. °¨ Rest during the day. °¨ Take naps when you feel tired. °· Limit activities where you have to think a lot or concentrate. These include: °¨ Doing  homework. °¨ Doing work related to a job. °¨ Watching TV. °¨ Using the computer. °Returning To Your Regular Activities °Return to your normal activities slowly, not all at once. You must give your body and brain enough time to heal.  °· Do not play sports or do other athletic activities until your doctor says it is okay. °· Ask your doctor when you can drive, ride a bicycle, or work other vehicles or machines. Never do these things if you feel dizzy. °· Ask your doctor about when you can return to work or school. °Preventing Another Concussion °It is very important to avoid another brain injury, especially before you have healed. In rare cases, another injury can lead to permanent brain damage, brain swelling, or death. The risk of this is greatest during the first 7-10 days after your injury. Avoid injuries by:  °· Wearing a seat belt when riding in a car. °· Not drinking too much alcohol. °· Avoiding activities that could lead to a second concussion (such as contact sports). °· Wearing a helmet when doing activities like: °¨ Biking. °¨ Skiing. °¨ Skateboarding. °¨ Skating. °· Making your home safer by: °¨ Removing things from the floor or stairways that could make you trip. °¨ Using grab bars in bathrooms and handrails by stairs. °¨ Placing non-slip mats on floors and in bathtubs. °¨ Improve lighting in dark areas. °GET HELP IF: °· It   gets even harder for you to pay attention or concentrate. °· It gets even harder for you to remember things or learn new things. °· You need more time than normal to finish things. °· You become annoyed (irritable) more than before. °· You are not able to deal with stress as well. °· You have more problems than before. °· You have problems keeping your balance. °· You are not able to react quickly when you should. °Get help if you have any of these problems for more than 2 weeks:  °· Lasting (chronic) headaches. °· Dizziness or trouble balancing. °· Feeling sick to your stomach  (nausea). °· Seeing (vision) problems. °· Being affected by noises or light more than normal. °· Feeling sad, low, down in the dumps, blue, gloomy, or empty (depressed). °· Mood changes (mood swings). °· Feeling of fear or nervousness about what may happen (anxiety). °· Feeling annoyed. °· Memory problems. °· Problems concentrating or paying attention. °· Sleep problems. °· Feeling tired all the time. °GET HELP RIGHT AWAY IF:  °· You have bad headaches or your headaches get worse. °· You have weakness (even if it is in one hand, leg, or part of the face). °· You have loss of feeling (numbness). °· You feel off balance. °· You keep throwing up (vomiting). °· You feel tired. °· One black center of your eye (pupil) is larger than the other. °· You twitch or shake violently (convulse). °· Your speech is not clear (slurred). °· You are more confused, easily angered (agitated), or annoyed than before. °· You have more trouble resting than before. °· You are unable to recognize people or places. °· You have neck pain. °· It is difficult to wake you up. °· You have unusual behavior changes. °· You pass out (lose consciousness). °MAKE SURE YOU:  °· Understand these instructions. °· Will watch your condition. °· Will get help right away if you are not doing well or get worse. °Document Released: 09/14/2009 Document Revised: 02/10/2014 Document Reviewed: 04/18/2013 °[employer]® Patient Information ©2015 [employer]. This information is not intended to replace advice given to you by your health care provider. Make sure you discuss any questions you have with your health care provider. ° °

## 2014-05-24 NOTE — ED Provider Notes (Signed)
CSN: [id number]     Arrival date & time 05/24/14  1629 History   First MD Initiated Contact with Patient 05/24/14 1633     Chief Complaint  Patient presents with  . Alcohol Intoxication  . Fall  . Laceration   Samuel [name] is a 53 yo AAM w/PMH of alcohol use who presents w/intoxication and head injury. Pt admits to drinking 3 beers today and slipping while walking downstairs. Hit the back of his head and sustained a scalp laceration. Denies any LOC. Has a mild headache. Denies neck pain, back pain, or extremity pain.  He denies NSAID or blood thinner use, CP, SOB, fever, chills, N/V, diarrhea, constipation, hematemesis, dysuria, hematuria, sick contacts, or recent travel. Tetanus unknown.    (Consider location/radiation/quality/duration/timing/severity/associated sxs/prior Treatment) Patient is a 53 y.o. male presenting with intoxication, fall, skin laceration, and scalp laceration. The history is provided by the patient.  Alcohol Intoxication This is a new problem. The current episode started today. The problem has been unchanged. Pertinent negatives include no abdominal pain, chest pain, chills, coughing, diaphoresis, fever, headaches, nausea, neck pain, numbness, vomiting or weakness.  Fall This is a new problem. The current episode started today. The problem has been unchanged. Pertinent negatives include no abdominal pain, chest pain, chills, coughing, diaphoresis, fever, headaches, nausea, neck pain, numbness, vomiting or weakness. Nothing aggravates the symptoms. He has tried nothing for the symptoms.  Laceration Head Laceration This is a new problem. The current episode started today. The problem has been unchanged. Pertinent negatives include no abdominal pain, chest pain, chills, coughing, diaphoresis, fever, headaches, nausea, neck pain, numbness, vomiting or weakness.    History reviewed. No pertinent past medical history. History reviewed. No pertinent past surgical  history. History reviewed. No pertinent family history. History  Substance Use Topics  . Smoking status: Current Every Day Smoker    Types: Cigarettes  . Smokeless tobacco: Not on file  . Alcohol Use: Yes    Review of Systems  Constitutional: Negative for fever, chills and diaphoresis.  Respiratory: Negative for cough and shortness of breath.   Cardiovascular: Negative for chest pain, palpitations and leg swelling.  Gastrointestinal: Negative for nausea, vomiting, abdominal pain, diarrhea, constipation and abdominal distention.  Genitourinary: Negative for dysuria, frequency, flank pain and decreased urine volume.  Musculoskeletal: Negative for neck pain.  Neurological: Negative for dizziness, speech difficulty, weakness, light-headedness, numbness and headaches.  All other systems reviewed and are negative.     Allergies  Review of patient's allergies indicates no known allergies.  Home Medications   Prior to Admission medications   Medication Sig Start Date End Date Taking? Authorizing Provider  acetaminophen (TYLENOL) 325 MG tablet Take 650 mg by mouth every 6 (six) hours as needed for mild pain, fever or headache.   Yes Historical Provider, MD   BP 118/83  Pulse 72  Temp(Src) 97.5 F (36.4 C) (Oral)  Resp 18  SpO2 99% Physical Exam  Nursing note and vitals reviewed. Constitutional: He appears well-developed and well-nourished. No distress.  HENT:  Head: Normocephalic and atraumatic.    Eyes: Pupils are equal, round, and reactive to light.  Neck: Normal range of motion.  Cardiovascular: Normal rate, regular rhythm, normal heart sounds and intact distal pulses.  Exam reveals no gallop and no friction rub.   No murmur heard. Pulmonary/Chest: Effort normal and breath sounds normal. No respiratory distress. He has no wheezes. He has no rales. He exhibits no tenderness.  Abdominal: Soft. Bowel sounds are normal.  He exhibits no distension and no mass. There is no  tenderness. There is no rebound and no guarding.  Musculoskeletal: Normal range of motion.  Lymphadenopathy:    He has no cervical adenopathy.  Skin: Skin is warm and dry. He is not diaphoretic.    ED Course  LACERATION REPAIR Date/Time: 05/24/2014 6:18 PM Performed by: Rachelle HoraSMITH, Holly Iannaccone Authorized by: Rachelle HoraSMITH, Lindberg Zenon Consent: Verbal consent obtained. Patient identity confirmed: verbally with patient Body area: head/neck Location details: scalp Laceration length: 1 cm Foreign bodies: no foreign bodies Tendon involvement: none Nerve involvement: none Vascular damage: no Irrigation solution: saline Irrigation method: syringe Amount of cleaning: standard Debridement: none Degree of undermining: none Number of sutures: 1 staple. Dressing: gauze roll Patient tolerance: Patient tolerated the procedure well with no immediate complications.   (including critical care time) Labs Review Labs Reviewed  ETHANOL - Abnormal; Notable for the following:    Alcohol, Ethyl (B) 385 (*)    All other components within normal limits  COMPREHENSIVE METABOLIC PANEL - Abnormal; Notable for the following:    BUN 5 (*)    Anion gap 17 (*)    All other components within normal limits  CBC WITH DIFFERENTIAL  URINALYSIS, ROUTINE W REFLEX MICROSCOPIC    Imaging Review Ct Head Wo Contrast  05/24/2014   CLINICAL DATA:  Status post fall. Laceration posterior aspect of the head.  EXAM: CT HEAD WITHOUT CONTRAST  CT CERVICAL SPINE WITHOUT CONTRAST  TECHNIQUE: Multidetector CT imaging of the head and cervical spine was performed following the standard protocol without intravenous contrast. Multiplanar CT image reconstructions of the cervical spine were also generated.  COMPARISON:  None.  FINDINGS: CT HEAD FINDINGS  Scalp laceration and extensive hematoma are seen about the posterior aspect of the calvarium. Although somewhat degraded by motion, no evidence of acute intracranial abnormality including infarct,  hemorrhage, mass lesion, mass effect, midline shift or abnormal extra-axial fluid collection is identified. The calvarium is intact. Imaged paranasal sinuses and mastoid air cells are clear.  CT CERVICAL SPINE FINDINGS  No fracture or malalignment of the cervical spine is identified. Intervertebral disc space height is maintained with some anterior endplate spurring noted. Lung apices are clear.  IMPRESSION: Scalp laceration without underlying fracture or acute intracranial abnormality.  No acute abnormality cervical spine.   Electronically Signed   By: Drusilla Kannerhomas  Dalessio M.D.   On: 05/24/2014 17:53   Ct Cervical Spine Wo Contrast  05/24/2014   CLINICAL DATA:  Status post fall. Laceration posterior aspect of the head.  EXAM: CT HEAD WITHOUT CONTRAST  CT CERVICAL SPINE WITHOUT CONTRAST  TECHNIQUE: Multidetector CT imaging of the head and cervical spine was performed following the standard protocol without intravenous contrast. Multiplanar CT image reconstructions of the cervical spine were also generated.  COMPARISON:  None.  FINDINGS: CT HEAD FINDINGS  Scalp laceration and extensive hematoma are seen about the posterior aspect of the calvarium. Although somewhat degraded by motion, no evidence of acute intracranial abnormality including infarct, hemorrhage, mass lesion, mass effect, midline shift or abnormal extra-axial fluid collection is identified. The calvarium is intact. Imaged paranasal sinuses and mastoid air cells are clear.  CT CERVICAL SPINE FINDINGS  No fracture or malalignment of the cervical spine is identified. Intervertebral disc space height is maintained with some anterior endplate spurring noted. Lung apices are clear.  IMPRESSION: Scalp laceration without underlying fracture or acute intracranial abnormality.  No acute abnormality cervical spine.   Electronically Signed   By: Drusilla Kannerhomas  Dalessio M.D.  On: 05/24/2014 17:53     EKG Interpretation None      MDM   53 yo AAM w/fall and scalp  bleed. Please see HPI for details. On exam, pt in NAD, appearingly intoxicated, AFVSS. He since hardboard and c-collar. Rolled off the board and no injuries noted over her spine or extremity is. Remains in c-collar as cannot be cleared as patient appears inebriated. Will obtain CT head and C-spine, as well as CBC, BMP, and EtOH level.   Pt adamant to take C-collar off. Explained to pt the risk of doing so, however, pt still chose to keep collar off. Pt compliant w/laying still on the bed, flat on his back.   CT head and C-spine negative for any injury or bleed. Pt's head cleaned and reveals a small laceration to right posterior scalp, no active bleeding. 1 staple placed. Please see procedure details above. Tdap given. Labs wnl aside from [facility] over 300. Pt able to ambulate easily despite intoxication. Conversing easily.  Stable for DC home. Follow up to PCP for wound check in 1 week. Strict return precautions include focal neural deficits, slurred speech, severe headache, or AMS.  Final diagnoses:  Scalp laceration, initial encounter  Hematoma of scalp, initial encounter    Pt was seen under the supervision of Dr. Rubin Payor.     Rachelle Hora, MD 05/24/14 1901  Rachelle Hora, MD 05/24/14 1902

## 2014-05-24 NOTE — ED Notes (Signed)
Pt presents to department via [facility]. Pt admits to heavy ETOH use today, states he accidentally fell down several steps today. Laceration noted to back of head, bleeding controlled. Pt cussing and yelling upon arrival. c-collar and LSB present per EMS. Pt is alert and oriented x4.

## 2014-05-24 NOTE — ED Provider Notes (Signed)
  Physical Exam  BP 118/83  Pulse 72  Temp(Src) 97.5 F (36.4 C) (Oral)  Resp 18  SpO2 99%  Physical Exam  ED Course  Procedures  MDM Patient with apparent alcohol intoxication and fall. Does have laceration of scalp. He was initially somewhat agitated but was able to allow the workup. Head CT reassuring. Will close laceration.      Juliet RudeNathan R. Rubin PayorPickering, MD 05/24/14 [phone]

## 2014-05-25 NOTE — ED Provider Notes (Signed)
I saw and evaluated the patient, reviewed the resident's note and I agree with the findings and plan.   EKG Interpretation None       Juliet RudeNathan R. Rubin PayorPickering, MD 05/25/14 [phone]

## 2014-06-03 ENCOUNTER — Emergency Department (HOSPITAL_COMMUNITY)
Admission: EM | Admit: 2014-06-03 | Discharge: 2014-06-03 | Disposition: A | Discharge status: Home / Self Care | Payer: No Typology Code available for payment source | Attending: Emergency Medicine | Admitting: Emergency Medicine

## 2014-06-03 DIAGNOSIS — Z4802 Encounter for removal of sutures: Secondary | ICD-10-CM | POA: Insufficient documentation

## 2014-06-03 DIAGNOSIS — F17200 Nicotine dependence, unspecified, uncomplicated: Secondary | ICD-10-CM | POA: Diagnosis not present

## 2014-06-03 NOTE — ED Notes (Signed)
States he is here today to have sutures removed from his head. No problems noted.

## 2014-06-03 NOTE — Discharge Instructions (Signed)
Suture Removal, Care After Refer to this sheet in the next few weeks. These instructions provide you with information on caring for yourself after your procedure. Your health care provider may also give you more specific instructions. Your treatment has been planned according to current medical practices, but problems sometimes occur. Call your health care provider if you have any problems or questions after your procedure. WHAT TO EXPECT AFTER THE PROCEDURE After your stitches (sutures) are removed, it is typical to have the following:  Some discomfort and swelling in the wound area.  Slight redness in the area. HOME CARE INSTRUCTIONS   If you have skin adhesive strips over the wound area, do not take the strips off. They will fall off on their own in a few days. If the strips remain in place after 14 days, you may remove them.  Change any bandages (dressings) at least once a day or as directed by your health care provider. If the bandage sticks, soak it off with warm, soapy water.  Apply cream or ointment only as directed by your health care provider. If using cream or ointment, wash the area with soap and water 2 times a day to remove all the cream or ointment. Rinse off the soap and pat the area dry with a clean towel.  Keep the wound area dry and clean. If the bandage becomes wet or dirty, or if it develops a bad smell, change it as soon as possible.  Continue to protect the wound from injury.  Use sunscreen when out in the sun. New scars become sunburned easily. SEEK MEDICAL CARE IF:  You have increasing redness, swelling, or pain in the wound.  You see pus coming from the wound.  You have a fever.  You notice a bad smell coming from the wound or dressing.  Your wound breaks open (edges not staying together). Document Released: 06/21/2001 Document Revised: 07/17/2013 Document Reviewed: 05/08/2013 [facility] Patient Information 2015 [place], [place]. This information is not  intended to replace advice given to you by your health care provider. Make sure you discuss any questions you have with your health care provider.  Staple Removal, Care After The staples that were used to close your skin have been removed. The care described here will need to continue until the wound is completely healed and your health care provider confirms that wound care can be stopped. HOME CARE INSTRUCTIONS   Keep the wound site dry and clean. Do not soak it in water.  If skin adhesive strips were applied after the staples were removed, they will begin to peel off in a few days. Allow them to remain in place until they fall off on their own.  If you still have a bandage (dressing), change it at least once a day or as directed by your health care provider. If the dressing sticks, pour warm, sterile water over it until it loosens and can be removed without pulling apart the wound edges. Pat dry with a clean towel.  Apply cream or ointment that stops the growth of bacteria (antibacterial cream or ointment) only if your health care provider has directed you to do so. Place a nonstick bandage over the wound to prevent the dressing from sticking.  Cover the nonstick bandage with a new dressing as directed by your health care provider.  If the bandage becomes wet, dirty, or develops a bad smell, change it as soon as possible.  New scars become sunburned easily. Use sunscreens with a sun protection  factor (SPF) of at least 15 when out in the sun. Reapply the SPF every 2 hours.  Only take medicines as directed by your health care provider. SEEK IMMEDIATE MEDICAL CARE IF:   You have redness, swelling, or increasing pain in the wound.  You have pus coming from the wound.  You have a fever.  You notice a bad smell coming from the wound or dressing.  Your wound edges open up after staples have been removed. MAKE SURE YOU:   Understand these instructions.  Will watch your  condition.  Will get help right away if you are not doing well or get worse. Document Released: 09/08/2008 Document Revised: 10/01/2013 Document Reviewed: 09/08/2008 [facility] Patient Information 2015 [place], [place]. This information is not intended to replace advice given to you by your health care provider. Make sure you discuss any questions you have with your health care provider.

## 2014-06-03 NOTE — ED Provider Notes (Signed)
CSN: [id number]     Arrival date & time 06/03/14  1225 History   First MD Initiated Contact with Patient 06/03/14 1357     Chief Complaint  Patient presents with  . Suture / Staple Removal     (Consider location/radiation/quality/duration/timing/severity/associated sxs/prior Treatment) HPI Comments: Patient is a 53 year old male who presents to the emergency department for staple removal. Patient was seen in the emergency department on August 15, 10 days ago after a fall when he had one staple placed in his scalp. Patient states he has not had any issues since staple placement. Denies any pain, fevers, chills or swelling.  The history is provided by the patient.    No past medical history on file. No past surgical history on file. No family history on file. History  Substance Use Topics  . Smoking status: Current Every Day Smoker    Types: Cigarettes  . Smokeless tobacco: Not on file  . Alcohol Use: Yes    Review of Systems  Constitutional: Negative for fever.  HENT: Negative.   Eyes: Negative.   Skin: Positive for wound.  Neurological: Negative for headaches.  All other systems reviewed and are negative.     Allergies  Review of patient's allergies indicates no known allergies.  Home Medications   Prior to Admission medications   Medication Sig Start Date End Date Taking? Authorizing Provider  acetaminophen (TYLENOL) 325 MG tablet Take 650 mg by mouth every 6 (six) hours as needed for mild pain, fever or headache.    Historical Provider, MD   BP 159/87  Pulse 57  Temp(Src) 98.1 F (36.7 C) (Oral)  Resp 18  SpO2 100% Physical Exam  Nursing note and vitals reviewed. Constitutional: He is oriented to person, place, and time. He appears well-developed and well-nourished. No distress.  HENT:  Head: Normocephalic and atraumatic.    Eyes: Conjunctivae and EOM are normal.  Neck: Normal range of motion. Neck supple.  Cardiovascular: Normal rate, regular rhythm and  normal heart sounds.   Pulmonary/Chest: Effort normal and breath sounds normal.  Musculoskeletal: Normal range of motion. He exhibits no edema.  Neurological: He is alert and oriented to person, place, and time.  Skin: Skin is warm and dry.  Psychiatric: He has a normal mood and affect. His behavior is normal.    ED Course  Procedures (including critical care time) Labs Review Labs Reviewed - No data to display  Imaging Review No results found.   EKG Interpretation None      MDM   Final diagnoses:  Encounter for staple removal   1 staple removed. Wound healing well. Stable for d/c. Return precautions given. Patient states understanding of treatment care plan and is agreeable.  Trevor Mace, PA-C 06/03/14 1430

## 2014-06-04 NOTE — ED Provider Notes (Signed)
Medical screening examination/treatment/procedure(s) were performed by non-physician practitioner and as supervising physician I was immediately available for consultation/collaboration.   EKG Interpretation None        Audree Camel, MD 06/04/14 [phone]

## 2015-05-01 ENCOUNTER — Inpatient Hospital Stay (HOSPITAL_COMMUNITY)
Admission: EM | Admit: 2015-05-01 | Discharge: 2015-05-04 | DRG: 439 | Disposition: A | Discharge status: Home / Self Care | Payer: No Typology Code available for payment source | Attending: Internal Medicine | Admitting: Internal Medicine

## 2015-05-01 ENCOUNTER — Encounter (HOSPITAL_COMMUNITY): Payer: Self-pay | Admitting: *Deleted

## 2015-05-01 ENCOUNTER — Emergency Department (HOSPITAL_COMMUNITY): Payer: No Typology Code available for payment source

## 2015-05-01 DIAGNOSIS — E876 Hypokalemia: Secondary | ICD-10-CM | POA: Diagnosis present

## 2015-05-01 DIAGNOSIS — F17210 Nicotine dependence, cigarettes, uncomplicated: Secondary | ICD-10-CM | POA: Diagnosis present

## 2015-05-01 DIAGNOSIS — K852 Alcohol induced acute pancreatitis without necrosis or infection: Secondary | ICD-10-CM

## 2015-05-01 DIAGNOSIS — E860 Dehydration: Secondary | ICD-10-CM | POA: Diagnosis present

## 2015-05-01 DIAGNOSIS — F1010 Alcohol abuse, uncomplicated: Secondary | ICD-10-CM | POA: Diagnosis not present

## 2015-05-01 DIAGNOSIS — E872 Acidosis: Secondary | ICD-10-CM | POA: Diagnosis present

## 2015-05-01 DIAGNOSIS — K859 Acute pancreatitis without necrosis or infection, unspecified: Secondary | ICD-10-CM | POA: Diagnosis present

## 2015-05-01 DIAGNOSIS — Z79899 Other long term (current) drug therapy: Secondary | ICD-10-CM | POA: Diagnosis not present

## 2015-05-01 DIAGNOSIS — F1020 Alcohol dependence, uncomplicated: Secondary | ICD-10-CM | POA: Diagnosis present

## 2015-05-01 DIAGNOSIS — D696 Thrombocytopenia, unspecified: Secondary | ICD-10-CM | POA: Diagnosis present

## 2015-05-01 DIAGNOSIS — Z791 Long term (current) use of non-steroidal anti-inflammatories (NSAID): Secondary | ICD-10-CM | POA: Diagnosis not present

## 2015-05-01 HISTORY — DX: Acute pancreatitis without necrosis or infection, unspecified: K85.90

## 2015-05-01 LAB — COMPREHENSIVE METABOLIC PANEL
ALT: 43 U/L (ref 17–63)
AST: 120 U/L — ABNORMAL HIGH (ref 15–41)
Albumin: 4.3 g/dL (ref 3.5–5.0)
Alkaline Phosphatase: 68 U/L (ref 38–126)
Anion gap: 19 — ABNORMAL HIGH (ref 5–15)
BILIRUBIN TOTAL: 1.6 mg/dL — AB (ref 0.3–1.2)
BUN: 7 mg/dL (ref 6–20)
CO2: 20 mmol/L — ABNORMAL LOW (ref 22–32)
Calcium: 10 mg/dL (ref 8.9–10.3)
Chloride: 94 mmol/L — ABNORMAL LOW (ref 101–111)
Creatinine, Ser: 0.75 mg/dL (ref 0.61–1.24)
GFR calc Af Amer: >60 mL/min (ref >60)
GLUCOSE: 97 mg/dL (ref 65–99)
Potassium: 3.9 mmol/L (ref 3.5–5.1)
SODIUM: 133 mmol/L — AB (ref 135–145)
Total Protein: 8.6 g/dL — ABNORMAL HIGH (ref 6.5–8.1)

## 2015-05-01 LAB — CBC
HCT: 37.3 % — ABNORMAL LOW (ref 39.0–52.0)
HEMOGLOBIN: 13 g/dL (ref 13.0–17.0)
MCH: 31.7 pg (ref 26.0–34.0)
MCHC: 34.9 g/dL (ref 30.0–36.0)
MCV: 91 fL (ref 78.0–100.0)
Platelets: 99 10*3/uL — ABNORMAL LOW (ref 150–400)
RBC: 4.1 MIL/uL — AB (ref 4.22–5.81)
RDW: 16.8 % — ABNORMAL HIGH (ref 11.5–15.5)
WBC: 6 10*3/uL (ref 4.0–10.5)

## 2015-05-01 LAB — URINALYSIS, ROUTINE W REFLEX MICROSCOPIC
Glucose, UA: NEGATIVE mg/dL
Leukocytes, UA: NEGATIVE
Nitrite: NEGATIVE
PH: 5 (ref 5.0–8.0)
Protein, ur: 100 mg/dL — AB
Specific Gravity, Urine: 1.037 — ABNORMAL HIGH (ref 1.005–1.030)
Urobilinogen, UA: 1 mg/dL (ref 0.0–1.0)

## 2015-05-01 LAB — URINE MICROSCOPIC-ADD ON

## 2015-05-01 LAB — LIPASE, BLOOD: LIPASE: 1067 U/L — AB (ref 22–51)

## 2015-05-01 MED ORDER — MORPHINE SULFATE 4 MG/ML IJ SOLN
4.0000 mg | Freq: Once | INTRAMUSCULAR | Status: AC
  Administered 2015-05-01: 4 mg via INTRAVENOUS
  Filled 2015-05-01: qty 1

## 2015-05-01 MED ORDER — IOHEXOL 300 MG/ML  SOLN
25.0000 mL | Freq: Once | INTRAMUSCULAR | Status: AC | PRN
  Administered 2015-05-01: 25 mL via ORAL

## 2015-05-01 MED ORDER — FENTANYL CITRATE (PF) 100 MCG/2ML IJ SOLN
50.0000 ug | Freq: Once | INTRAMUSCULAR | Status: AC
  Administered 2015-05-01: 50 ug via INTRAVENOUS
  Filled 2015-05-01: qty 2

## 2015-05-01 MED ORDER — SODIUM CHLORIDE 0.9 % IV BOLUS (SEPSIS)
1000.0000 mL | Freq: Once | INTRAVENOUS | Status: AC
  Administered 2015-05-01: 1000 mL via INTRAVENOUS

## 2015-05-01 MED ORDER — IOHEXOL 300 MG/ML  SOLN
100.0000 mL | Freq: Once | INTRAMUSCULAR | Status: AC | PRN
  Administered 2015-05-01: 100 mL via INTRAVENOUS

## 2015-05-01 NOTE — ED Notes (Signed)
Pt given urinal and instructed to provide urine sample; pt verbalized understanding 
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
## 2015-05-01 NOTE — ED Notes (Signed)
Patient transported to CT 

## 2015-05-01 NOTE — ED Provider Notes (Signed)
CSN: [id number]     Arrival date & time 05/01/15  1818 History   First MD Initiated Contact with Patient 05/01/15 1838     Chief Complaint  Patient presents with  . Abdominal Pain     (Consider location/radiation/quality/duration/timing/severity/associated sxs/prior Treatment) HPI Samuel [name] is a 54 y.o. male with a history of alcohol abuse and pancreatitis comes in for evaluation of abdominal pain. Patient states he has had abdominal pain for the past 2 days. States it feels similar to when he had pancreatitis. Pain was gradual in onset. Reports he will typically have one 40 every day, but yesterday only had half of a 40 and began to have abdominal discomfort. Denies fevers, chills, nausea, vomiting, diarrhea, bloody or dark stools, urinary symptoms. History reviewed. No pertinent past medical history. History reviewed. No pertinent past surgical history. History reviewed. No pertinent family history. History  Substance Use Topics  . Smoking status: Current Every Day Smoker    Types: Cigarettes  . Smokeless tobacco: Not on file  . Alcohol Use: Yes    Review of Systems A 10 point review of systems was completed and was negative except for pertinent positives and negatives as mentioned in the history of present illness     Allergies  Review of patient's allergies indicates no known allergies.  Home Medications   Prior to Admission medications   Medication Sig Start Date End Date Taking? Authorizing Provider  acetaminophen (TYLENOL) 325 MG tablet Take 650 mg by mouth every 6 (six) hours as needed for mild pain, fever or headache.   Yes Historical Provider, MD  ibuprofen (ADVIL,MOTRIN) 200 MG tablet Take 200 mg by mouth every 6 (six) hours as needed for moderate pain.   Yes Historical Provider, MD   BP 142/80 mmHg  Pulse 54  Temp(Src) 97.5 F (36.4 C) (Oral)  Resp 20  Ht  (1.651 m)  Wt 112 lb (50.803 kg)  BMI 18.64 kg/m2  SpO2 99% Physical Exam  Constitutional:  He is oriented to person, place, and time. He appears well-developed and well-nourished.  HENT:  Head: Normocephalic and atraumatic.  Mouth/Throat: Oropharynx is clear and moist.  Eyes: Conjunctivae are normal. Pupils are equal, round, and reactive to light. Right eye exhibits no discharge. Left eye exhibits no discharge. No scleral icterus.  Neck: Neck supple.  Cardiovascular: Normal rate, regular rhythm and normal heart sounds.   Pulmonary/Chest: Effort normal and breath sounds normal. No respiratory distress. He has no wheezes. He has no rales.  Abdominal: Soft. There is no tenderness.  Exquisite tenderness to palpation throughout abdomen. Abdomen is otherwise soft and nondistended. No lesions or deformities. No palpable masses.  Musculoskeletal: He exhibits no tenderness.  Neurological: He is alert and oriented to person, place, and time.  Cranial Nerves II-XII grossly intact  Skin: Skin is warm and dry. No rash noted.  Psychiatric: He has a normal mood and affect.  Nursing note and vitals reviewed.   ED Course  Procedures (including critical care time) Labs Review Labs Reviewed  LIPASE, BLOOD - Abnormal; Notable for the following:    Lipase 1067 (*)    All other components within normal limits  COMPREHENSIVE METABOLIC PANEL - Abnormal; Notable for the following:    Sodium 133 (*)    Chloride 94 (*)    CO2 20 (*)    Total Protein 8.6 (*)    AST 120 (*)    Total Bilirubin 1.6 (*)    Anion gap 19 (*)  All other components within normal limits  CBC - Abnormal; Notable for the following:    RBC 4.10 (*)    HCT 37.3 (*)    RDW 16.8 (*)    Platelets 99 (*)    All other components within normal limits  URINALYSIS, ROUTINE W REFLEX MICROSCOPIC (NOT AT [facility]) - Abnormal; Notable for the following:    Color, Urine AMBER (*)    Specific Gravity, Urine 1.037 (*)    Hgb urine dipstick SMALL (*)    Bilirubin Urine MODERATE (*)    Ketones, ur >80 (*)    Protein, ur 100 (*)    All  other components within normal limits  URINE MICROSCOPIC-ADD ON - Abnormal; Notable for the following:    Bacteria, UA FEW (*)    All other components within normal limits    Imaging Review Ct Abdomen Pelvis W Contrast  05/01/2015   CLINICAL DATA:  Diffuse abdominal pain for 2.5 days. Prior history of pancreatitis.  EXAM: CT ABDOMEN AND PELVIS WITH CONTRAST  TECHNIQUE: Multidetector CT imaging of the abdomen and pelvis was performed using the standard protocol following bolus administration of intravenous contrast.  CONTRAST:  OMNIPAQUE IOHEXOL 300 MG/ML  SOLN  COMPARISON:  07/01/2009, abdominal ultrasound 07/12/2010  FINDINGS: Lower chest: Motion degradation at the lung bases obscures detail. No gross evidence for focal abnormality.  Hepatobiliary: Motion artifact obscures detail throughout the upper abdomen. Hepatic hypodensity suggests steatosis without focal abnormality. Mild gallbladder distention without other abnormality identified.  Pancreas: There is fullness of the pancreatic distal body and tail with a small amount of peripancreatic fluid as well as peripancreatic stranding. No rim enhancing fluid collection to suggest pseudocyst or abscess. Pancreatic duct is normal in caliber. Coarse calcifications at the pancreatic head are compatible with previous pancreatitis.  Spleen: Grossly normal.  Adrenals/Urinary Tract: Adrenal glands are poorly visualized. Exophytic left mid renal cortical lesion is smaller in size than previously with rim calcification likely indicating interval decrease in size with possible internal hemorrhage. Too small to characterize bilateral renal cortical hypodense lesions are noted measuring smaller than 5 mm. No perinephric fluid or hydroureteronephrosis. No radiopaque ureteral or bladder calculus.  Stomach/Bowel: Appendix is normal. Mild stool burden. No bowel wall thickening or focal segmental dilatation is identified.  Vascular/Lymphatic: No lymphadenopathy. A few  prominent retroperitoneal nodes are identified including periaortic 0.6 cm short axis diameter lymph node image 26.  Other: Moderate free fluid is noted tracking to the pelvis.  Musculoskeletal: Lumbar spine disc degenerative change identified. Probable artifactual template deformity at L1 related to motion at this level.  IMPRESSION: Constellation of findings compatible with acute pancreatitis. Peripancreatic fluid is identified but no evidence for pseudocyst formation or other complications such as bowel obstruction.   Electronically Signed   By: Christiana Pellant M.D.   On: 05/01/2015 21:20     EKG Interpretation None     Meds given in ED:  Medications  iohexol (OMNIPAQUE) 300 MG/ML solution 25 mL (25 mLs Oral Contrast Given 05/01/15 1931)  fentaNYL (SUBLIMAZE) injection 50 mcg (50 mcg Intravenous Given 05/01/15 1934)  sodium chloride 0.9 % bolus 1,000 mL (0 mLs Intravenous Stopped 05/01/15 2208)  iohexol (OMNIPAQUE) 300 MG/ML solution 100 mL (100 mLs Intravenous Contrast Given 05/01/15 2045)  morphine 4 MG/ML injection 4 mg (4 mg Intravenous Given 05/01/15 2206)    New Prescriptions   No medications on file   Filed Vitals:   05/01/15 1825 05/01/15 1915 05/01/15 2104 05/01/15 2203  BP:  137/87 131/82 135/91 142/80  Pulse: 87 52 54 54  Temp: 97.5 F (36.4 C)     TempSrc: Oral     Resp: 20     Height: 5\' 5"  (1.651 m)     Weight: 112 lb (50.803 kg)     SpO2: 98% 100% 100% 99%    MDM  Vitals stable - WNL -afebrile Pt resting comfortably in ED. pain controlled in ED. PE--diffuse tenderness throughout abdomen, worse in epigastrium. Labwork--lipase 1067, anion gap 19, bicarbonate 20 Imaging--CT abdomen consistent with pancreatitis  Patient here for likely alcohol-induced pancreatitis. Pain is controlled and vitals are stable. No evidence of other acute infectious process Discussed patient presentation with my attending, Dr. Alena Bills who agrees with plan for admission to medical service  for further evaluation and treatment of pancreatitis. Consult to hospital service, patient admitted to MedSurg bed. .  Final diagnoses:  Alcohol-induced acute pancreatitis       Joycie Peek, PA-C 05/02/15 8295  Pricilla Loveless, MD 05/02/15 [phone]

## 2015-05-01 NOTE — Progress Notes (Signed)
New Admission Note:   Arrival Method: via stretcher from ED Mental Orientation: Alert and Oriented x4 Telemetry: N/A Assessment: Completed Skin: intact, warm, and dry IV: Left Antecubital Peripheral IV with Normal Saline at 125 mL/hr per MD order Pain: 2/10 Tubes: N/A Safety Measures: Educated on fall prevention safety plan, patient acknowledged and understood. Admission: Completed [address] [place] Orientation: Patient has been orientated to the room, unit and staff.  Family: N/A  Orders have been reviewed and implemented. Will continue to monitor the patient. Call light has been placed within reach and bed alarm has been activated.    Billy Fischer, RN  Phone number: [phone]

## 2015-05-02 ENCOUNTER — Encounter (HOSPITAL_COMMUNITY): Payer: Self-pay | Admitting: Internal Medicine

## 2015-05-02 DIAGNOSIS — F1010 Alcohol abuse, uncomplicated: Secondary | ICD-10-CM

## 2015-05-02 DIAGNOSIS — K852 Alcohol induced acute pancreatitis: Principal | ICD-10-CM

## 2015-05-02 LAB — COMPREHENSIVE METABOLIC PANEL
ALT: 31 U/L (ref 17–63)
AST: 79 U/L — AB (ref 15–41)
Albumin: 3.2 g/dL — ABNORMAL LOW (ref 3.5–5.0)
Alkaline Phosphatase: 57 U/L (ref 38–126)
Anion gap: 13 (ref 5–15)
BUN: 6 mg/dL (ref 6–20)
CALCIUM: 8.9 mg/dL (ref 8.9–10.3)
CO2: 21 mmol/L — ABNORMAL LOW (ref 22–32)
CREATININE: 0.56 mg/dL — AB (ref 0.61–1.24)
Chloride: 99 mmol/L — ABNORMAL LOW (ref 101–111)
Glucose, Bld: 77 mg/dL (ref 65–99)
Potassium: 3.1 mmol/L — ABNORMAL LOW (ref 3.5–5.1)
Sodium: 133 mmol/L — ABNORMAL LOW (ref 135–145)
TOTAL PROTEIN: 6.5 g/dL (ref 6.5–8.1)
Total Bilirubin: 1.3 mg/dL — ABNORMAL HIGH (ref 0.3–1.2)

## 2015-05-02 LAB — GLUCOSE, CAPILLARY
GLUCOSE-CAPILLARY: 85 mg/dL (ref 65–99)
Glucose-Capillary: 72 mg/dL (ref 65–99)
Glucose-Capillary: 70 mg/dL (ref 65–99)
Glucose-Capillary: 78 mg/dL (ref 65–99)
Glucose-Capillary: 103 mg/dL — ABNORMAL HIGH (ref 65–99)

## 2015-05-02 LAB — CBC WITH DIFFERENTIAL/PLATELET
BASOS PCT: 0 % (ref 0–1)
Basophils Absolute: 0 10*3/uL (ref 0.0–0.1)
EOS PCT: 1 % (ref 0–5)
Eosinophils Absolute: 0 10*3/uL (ref 0.0–0.7)
HEMATOCRIT: 31.7 % — AB (ref 39.0–52.0)
Hemoglobin: 10.9 g/dL — ABNORMAL LOW (ref 13.0–17.0)
Lymphocytes Relative: 17 % (ref 12–46)
Lymphs Abs: 1.1 10*3/uL (ref 0.7–4.0)
MCH: 31 pg (ref 26.0–34.0)
MCHC: 34.4 g/dL (ref 30.0–36.0)
MCV: 90.1 fL (ref 78.0–100.0)
MONO ABS: 0.9 10*3/uL (ref 0.1–1.0)
MONOS PCT: 14 % — AB (ref 3–12)
NEUTROS PCT: 69 % (ref 43–77)
Neutro Abs: 4.4 10*3/uL (ref 1.7–7.7)
Platelets: 84 10*3/uL — ABNORMAL LOW (ref 150–400)
RBC: 3.52 MIL/uL — AB (ref 4.22–5.81)
RDW: 17.1 % — ABNORMAL HIGH (ref 11.5–15.5)
WBC: 6.4 10*3/uL (ref 4.0–10.5)

## 2015-05-02 LAB — LIPASE, BLOOD: LIPASE: 651 U/L — AB (ref 22–51)

## 2015-05-02 MED ORDER — VITAMIN B-1 100 MG PO TABS
100.0000 mg | ORAL_TABLET | Freq: Every day | ORAL | Status: DC
Start: 2015-05-02 — End: 2015-05-04
  Administered 2015-05-02 – 2015-05-04 (×3): 100 mg via ORAL
  Filled 2015-05-02 (×3): qty 1

## 2015-05-02 MED ORDER — LORAZEPAM 2 MG/ML IJ SOLN: 0.0000 mg | Freq: Four times a day (QID) | INTRAMUSCULAR | Status: DC

## 2015-05-02 MED ORDER — ADULT MULTIVITAMIN W/MINERALS CH
1.0000 | ORAL_TABLET | Freq: Every day | ORAL | Status: DC
Start: 2015-05-02 — End: 2015-05-04
  Administered 2015-05-02 – 2015-05-04 (×3): 1 via ORAL
  Filled 2015-05-02 (×3): qty 1

## 2015-05-02 MED ORDER — SODIUM CHLORIDE 0.9 % IV SOLN
INTRAVENOUS | Status: AC
Start: 2015-05-02 — End: 2015-05-03
  Administered 2015-05-02: 18:00:00 via INTRAVENOUS
  Administered 2015-05-03: 125 mL/h via INTRAVENOUS

## 2015-05-02 MED ORDER — LORAZEPAM 1 MG PO TABS: 1.0000 mg | ORAL_TABLET | Freq: Four times a day (QID) | ORAL | Status: DC | PRN

## 2015-05-02 MED ORDER — FOLIC ACID 1 MG PO TABS
1.0000 mg | ORAL_TABLET | Freq: Every day | ORAL | Status: DC
  Administered 2015-05-02 – 2015-05-04 (×3): 1 mg via ORAL
  Filled 2015-05-02 (×3): qty 1

## 2015-05-02 MED ORDER — LORAZEPAM 2 MG/ML IJ SOLN: 1.0000 mg | Freq: Four times a day (QID) | INTRAMUSCULAR | Status: DC | PRN

## 2015-05-02 MED ORDER — ACETAMINOPHEN 325 MG PO TABS
650.0000 mg | ORAL_TABLET | Freq: Four times a day (QID) | ORAL | Status: DC | PRN
Start: 2015-05-02 — End: 2015-05-04

## 2015-05-02 MED ORDER — LORAZEPAM 2 MG/ML IJ SOLN: 0.0000 mg | Freq: Two times a day (BID) | INTRAMUSCULAR | Status: DC

## 2015-05-02 MED ORDER — ONDANSETRON HCL 4 MG/2ML IJ SOLN: 4.0000 mg | Freq: Four times a day (QID) | INTRAMUSCULAR | Status: DC | PRN

## 2015-05-02 MED ORDER — HYDROMORPHONE HCL 1 MG/ML IJ SOLN: 1.0000 mg | INTRAMUSCULAR | Status: DC | PRN

## 2015-05-02 MED ORDER — ONDANSETRON HCL 4 MG PO TABS: 4.0000 mg | ORAL_TABLET | Freq: Four times a day (QID) | ORAL | Status: DC | PRN

## 2015-05-02 MED ORDER — SODIUM CHLORIDE 0.9 % IV SOLN
INTRAVENOUS | Status: DC
Start: 2015-05-02 — End: 2015-05-02
  Administered 2015-05-02 (×2): via INTRAVENOUS

## 2015-05-02 MED ORDER — THIAMINE HCL 100 MG/ML IJ SOLN
100.0000 mg | Freq: Every day | INTRAMUSCULAR | Status: DC
  Filled 2015-05-02 (×3): qty 1

## 2015-05-02 MED ORDER — ACETAMINOPHEN 650 MG RE SUPP: 650.0000 mg | Freq: Four times a day (QID) | RECTAL | Status: DC | PRN

## 2015-05-02 MED ORDER — ENOXAPARIN SODIUM 40 MG/0.4ML ~~LOC~~ SOLN
40.0000 mg | Freq: Every day | SUBCUTANEOUS | Status: DC
  Filled 2015-05-02: qty 0.4

## 2015-05-02 NOTE — Progress Notes (Signed)
Pt admitted after midnight, seen and examined this AM. Please see earlier admission note by Dr. Toniann Fail. Pt admitted for management of alcohol induced acute pancreatitis. Conservative management with IVF, analgesia and antiemetics as needed. Wants to have diet advanced to clears. Repeat CMET, lipase in am.  Debbora Presto, MD  [facility] Pager [phone]  If 7PM-7AM, please contact night-coverage [email or web address] Password [email or web address]

## 2015-05-02 NOTE — Progress Notes (Signed)
Utilization Review completed. Kao Conry RN BSN CM 

## 2015-05-02 NOTE — H&P (Signed)
[facility] History and Physical  Samuel [name] UXN:[id number] DOB: 1960-11-15 DOA: 05/01/2015  Referring physician: Mr. Varney Baas. PCP: No PCP Per Patient none. Specialists: None.  Chief Complaint: Abdominal pain.  HPI: Samuel [name] is a 54 y.o. male with history of alcohol abuse presents to the ER because of abdominal pain. Patient has been experiencing abdominal pain over the last 2 days. Patient's pain is generalized. Denies any associated nausea vomiting or diarrhea. In the ER patient's lipase was elevated and CT abdomen and pelvis done shows features concerning for pancreatitis. On exam patient has tenderness over the epigastric area. Denies any chest pain or shortness of breath. Patient's last drink of alcohol was yesterday.   Review of Systems: As presented in the history of presenting illness, rest negative.  Past Medical History  Diagnosis Date  . Pancreatitis    Past Surgical History  Procedure Laterality Date  . No past surgeries     Social History:  reports that he has been smoking Cigarettes.  He does not have any smokeless tobacco history on file. He reports that he drinks alcohol. He reports that he does not use illicit drugs. Where does patient live home. Can patient participate in ADLs? Yes.  No Known Allergies  Family History:  Family History  Problem Relation Age of Onset  . Pancreatitis Neg Hx       Prior to Admission medications   Medication Sig Start Date End Date Taking? Authorizing Provider  acetaminophen (TYLENOL) 325 MG tablet Take 650 mg by mouth every 6 (six) hours as needed for mild pain, fever or headache.   Yes Historical Provider, MD  ibuprofen (ADVIL,MOTRIN) 200 MG tablet Take 200 mg by mouth every 6 (six) hours as needed for moderate pain.   Yes Historical Provider, MD    Physical Exam: Filed Vitals:   05/01/15 1915 05/01/15 2104 05/01/15 2203 05/01/15 2251  BP: 131/82 135/91 142/80 147/78  Pulse: 52 54 54 59  Temp:    98 F  (36.7 C)  TempSrc:    Oral  Resp:    18  Height:    5\' 5"  (1.651 m)  Weight:    50.758 kg (111 lb 14.4 oz)  SpO2: 100% 100% 99% 100%     General:  Moderately built and poorly nourished.  Eyes: Anicteric no pallor.  ENT: No discharge from the ears eyes nose or mouth.  Neck: No mass felt.  Cardiovascular: S1-S2 heard.  Respiratory: No rhonchi or crepitations.  Abdomen: Mild epigastric tenderness no guarding or rigidity.  Skin: No rash.  Musculoskeletal: No edema.  Psychiatric: Appears normal.  Neurologic: Alert awake oriented to time place and person. Moves all extremities.  Labs on Admission:  Basic Metabolic Panel:  Recent Labs Lab 05/01/15 1835  NA 133*  K 3.9  CL 94*  CO2 20*  GLUCOSE 97  BUN 7  CREATININE 0.75  CALCIUM 10.0   Liver Function Tests:  Recent Labs Lab 05/01/15 1835  AST 120*  ALT 43  ALKPHOS 68  BILITOT 1.6*  PROT 8.6*  ALBUMIN 4.3    Recent Labs Lab 05/01/15 1835  LIPASE 1067*   No results for input(s): AMMONIA in the last 168 hours. CBC:  Recent Labs Lab 05/01/15 1835  WBC 6.0  HGB 13.0  HCT 37.3*  MCV 91.0  PLT 99*   Cardiac Enzymes: No results for input(s): CKTOTAL, CKMB, CKMBINDEX, TROPONINI in the last 168 hours.  BNP (last 3 results) No results for input(s): BNP in  the last 8760 hours.  ProBNP (last 3 results) No results for input(s): PROBNP in the last 8760 hours.  CBG: No results for input(s): GLUCAP in the last 168 hours.  Radiological Exams on Admission: Ct Abdomen Pelvis W Contrast  05/01/2015   CLINICAL DATA:  Diffuse abdominal pain for 2.5 days. Prior history of pancreatitis.  EXAM: CT ABDOMEN AND PELVIS WITH CONTRAST  TECHNIQUE: Multidetector CT imaging of the abdomen and pelvis was performed using the standard protocol following bolus administration of intravenous contrast.  CONTRAST:  OMNIPAQUE IOHEXOL 300 MG/ML  SOLN  COMPARISON:  07/01/2009, abdominal ultrasound 07/12/2010  FINDINGS:  Lower chest: Motion degradation at the lung bases obscures detail. No gross evidence for focal abnormality.  Hepatobiliary: Motion artifact obscures detail throughout the upper abdomen. Hepatic hypodensity suggests steatosis without focal abnormality. Mild gallbladder distention without other abnormality identified.  Pancreas: There is fullness of the pancreatic distal body and tail with a small amount of peripancreatic fluid as well as peripancreatic stranding. No rim enhancing fluid collection to suggest pseudocyst or abscess. Pancreatic duct is normal in caliber. Coarse calcifications at the pancreatic head are compatible with previous pancreatitis.  Spleen: Grossly normal.  Adrenals/Urinary Tract: Adrenal glands are poorly visualized. Exophytic left mid renal cortical lesion is smaller in size than previously with rim calcification likely indicating interval decrease in size with possible internal hemorrhage. Too small to characterize bilateral renal cortical hypodense lesions are noted measuring smaller than 5 mm. No perinephric fluid or hydroureteronephrosis. No radiopaque ureteral or bladder calculus.  Stomach/Bowel: Appendix is normal. Mild stool burden. No bowel wall thickening or focal segmental dilatation is identified.  Vascular/Lymphatic: No lymphadenopathy. A few prominent retroperitoneal nodes are identified including periaortic 0.6 cm short axis diameter lymph node image 26.  Other: Moderate free fluid is noted tracking to the pelvis.  Musculoskeletal: Lumbar spine disc degenerative change identified. Probable artifactual template deformity at L1 related to motion at this level.  IMPRESSION: Constellation of findings compatible with acute pancreatitis. Peripancreatic fluid is identified but no evidence for pseudocyst formation or other complications such as bowel obstruction.   Electronically Signed   By: Christiana Pellant M.D.   On: 05/01/2015 21:20     Assessment/Plan Principal Problem:   Acute  pancreatitis Active Problems:   Alcohol abuse   1. Acute alcoholic pancreatitis - for now Patient nothing by mouth and in a.m. if patient's pain is improved and may try clear liquids and advance. Continue with daily medications and gently hydrate. Patient advised to quit alcohol. 2. Alcohol abuse - patient advised to quit alcohol and patient has been placed on CIWA protocol. 3. Thrombocytopenia probably from alcoholism. Follow CBC. 4. Metabolic acidosis probably from dehydration and alcoholism. Gently hydrate him closely follow metabolic panel.   DVT Prophylaxis SCDs due to thrombocytopenia. Code Status: Full code.  Family Communication: Discussed with patient.  Disposition Plan: Admit to inpatient.    Nature Vogelsang N. [facility] Pager [phone].  If 7PM-7AM, please contact night-coverage [email or web address] Password [email or web address] 05/02/2015, 1:04 AM

## 2015-05-03 LAB — COMPREHENSIVE METABOLIC PANEL
ALT: 24 U/L (ref 17–63)
ANION GAP: 8 (ref 5–15)
AST: 58 U/L — AB (ref 15–41)
Albumin: 2.8 g/dL — ABNORMAL LOW (ref 3.5–5.0)
Alkaline Phosphatase: 51 U/L (ref 38–126)
BILIRUBIN TOTAL: 1.2 mg/dL (ref 0.3–1.2)
BUN: <5 mg/dL — ABNORMAL LOW (ref 6–20)
CALCIUM: 8.3 mg/dL — AB (ref 8.9–10.3)
CHLORIDE: 99 mmol/L — AB (ref 101–111)
CO2: 23 mmol/L (ref 22–32)
CREATININE: 0.48 mg/dL — AB (ref 0.61–1.24)
GFR calc Af Amer: >60 mL/min (ref >60)
GFR calc non Af Amer: >60 mL/min (ref >60)
Glucose, Bld: 99 mg/dL (ref 65–99)
Potassium: 3.1 mmol/L — ABNORMAL LOW (ref 3.5–5.1)
Sodium: 130 mmol/L — ABNORMAL LOW (ref 135–145)
Total Protein: 6 g/dL — ABNORMAL LOW (ref 6.5–8.1)

## 2015-05-03 LAB — GLUCOSE, CAPILLARY
GLUCOSE-CAPILLARY: 109 mg/dL — AB (ref 65–99)
GLUCOSE-CAPILLARY: 110 mg/dL — AB (ref 65–99)
GLUCOSE-CAPILLARY: 177 mg/dL — AB (ref 65–99)
Glucose-Capillary: 107 mg/dL — ABNORMAL HIGH (ref 65–99)
Glucose-Capillary: 130 mg/dL — ABNORMAL HIGH (ref 65–99)
Glucose-Capillary: 148 mg/dL — ABNORMAL HIGH (ref 65–99)

## 2015-05-03 LAB — CBC
HCT: 31.4 % — ABNORMAL LOW (ref 39.0–52.0)
Hemoglobin: 11 g/dL — ABNORMAL LOW (ref 13.0–17.0)
MCH: 31.6 pg (ref 26.0–34.0)
MCHC: 35 g/dL (ref 30.0–36.0)
MCV: 90.2 fL (ref 78.0–100.0)
PLATELETS: 80 10*3/uL — AB (ref 150–400)
RBC: 3.48 MIL/uL — ABNORMAL LOW (ref 4.22–5.81)
RDW: 17 % — ABNORMAL HIGH (ref 11.5–15.5)
WBC: 5.5 10*3/uL (ref 4.0–10.5)

## 2015-05-03 LAB — LIPASE, BLOOD: LIPASE: 557 U/L — AB (ref 22–51)

## 2015-05-03 LAB — MAGNESIUM: Magnesium: 1.3 mg/dL — ABNORMAL LOW (ref 1.7–2.4)

## 2015-05-03 MED ORDER — LORAZEPAM 1 MG PO TABS: 0.0000 mg | ORAL_TABLET | Freq: Two times a day (BID) | ORAL | Status: DC

## 2015-05-03 MED ORDER — LORAZEPAM 1 MG PO TABS: 0.0000 mg | ORAL_TABLET | Freq: Four times a day (QID) | ORAL | Status: AC

## 2015-05-03 MED ORDER — POTASSIUM CHLORIDE CRYS ER 20 MEQ PO TBCR
40.0000 meq | EXTENDED_RELEASE_TABLET | Freq: Once | ORAL | Status: AC
  Administered 2015-05-03: 40 meq via ORAL
  Filled 2015-05-03: qty 2

## 2015-05-03 MED ORDER — MAGNESIUM SULFATE 2 GM/50ML IV SOLN
2.0000 g | Freq: Once | INTRAVENOUS | Status: AC
  Administered 2015-05-03: 2 g via INTRAVENOUS
  Filled 2015-05-03: qty 50

## 2015-05-03 MED ORDER — SODIUM CHLORIDE 0.9 % IV SOLN
INTRAVENOUS | Status: DC
  Administered 2015-05-03: 100 mL/h via INTRAVENOUS
  Administered 2015-05-03: 15:00:00 via INTRAVENOUS
  Administered 2015-05-04: 100 mL/h via INTRAVENOUS

## 2015-05-03 NOTE — Progress Notes (Signed)
Patient ID: Samuel [name], male   DOB: 1961/01/18, 54 y.o.   MRN: [id number]  [facility] PROGRESS NOTE  Samuel [name]:[id number] DOB: Jul 20, 1961 DOA: 05/01/2015 PCP: No PCP Per Patient   Brief narrative:    Pt admitted for evaluation of N/V, secondary to acute alcohol induced pancreatitis.   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  Family Communication:  plan of care discussed with the patient Disposition Plan: Home when stable.   IV access:  Peripheral IV  Procedures and diagnostic studies:    Ct Abdomen Pelvis W Contrast 05/01/2015  Constellation of findings compatible with acute pancreatitis. Peripancreatic fluid is identified but no evidence for pseudocyst formation or other complications such as bowel obstruction.     Medical Consultants:  None  Other Consultants:  None   IAnti-Infectives:   None   Debbora Presto, MD  [facility] Pager [phone]  If 7PM-7AM, please contact night-coverage [email or web address] Password [email or web address] 05/03/2015, 8:00 AM   LOS: 2 days   HPI/Subjective: No events overnight.   Objective: Filed Vitals:   05/02/15 2008 05/03/15 0413 05/03/15 0500 05/03/15 0731  BP: 128/85 126/77  131/76  Pulse: 76 52  65  Temp: 99.7 F (37.6 C) 98.8 F (37.1 C)  98.4 F (36.9 C)  TempSrc: Oral Oral  Oral  Resp: Height:      Weight: 86.57 kg (114 lb)  51.7 kg (113 lb 15.7 oz)   SpO2: 98% 99%  98%    Intake/Output Summary (Last 24 hours) at 05/03/15 0800 Last data filed at  05/03/15 8469  Gross per 24 hour  Intake 4731.25 ml  Output   1800 ml  Net 2931.25 ml    Exam:   General:  Pt is alert, follows commands appropriately, not in acute distress  Cardiovascular: Regular rate and rhythm, S1/S2, no murmurs, no rubs, no gallops  Respiratory: Clear to auscultation bilaterally, no wheezing, no crackles, no rhonchi  Abdomen: Soft, non tender, non distended, bowel sounds present, no guarding  Extremities: No edema, pulses DP and PT palpable bilaterally  Neuro: Grossly nonfocal  Data Reviewed: Basic Metabolic Panel:  Recent Labs Lab 05/01/15 1835 05/02/15 0317 05/03/15 0510  NA 133* 133* 130*  K 3.9 3.1* 3.1*  CL 94* 99* 99*  CO2 20* 21* 23  GLUCOSE 97 77 99  BUN 7 6 <5*  CREATININE 0.75 0.56* 0.48*  CALCIUM 10.0 8.9 8.3*  MG  --   --  1.3*   Liver Function Tests:  Recent Labs Lab 05/01/15 1835 05/02/15 0317 05/03/15 0510  AST 120* 79* 58*  ALT 43 31 24  ALKPHOS 68 57 51  BILITOT 1.6* 1.3* 1.2  PROT 8.6* 6.5 6.0*  ALBUMIN 4.3 3.2* 2.8*    Recent Labs Lab 05/01/15 1835 05/02/15 0317 05/03/15 0510  LIPASE 1067* 651* 557*   No results for input(s): AMMONIA in the last 168 hours. CBC:  Recent Labs Lab 05/01/15 1835 05/02/15 0317 05/03/15 0510  WBC 6.0 6.4 5.5  NEUTROABS  --  4.4  --   HGB 13.0 10.9* 11.0*  HCT 37.3* 31.7* 31.4*  MCV 91.0 90.1 90.2  PLT 99* 84* 80*   CBG:  Recent Labs Lab 05/02/15 1107 05/02/15 1626 05/02/15 2006 05/02/15 2356 05/03/15 0407  GLUCAP 78 85 103* 110* 107*    No results found for this or any previous visit (from the past 240 hour(s)).   Scheduled Meds: . folic acid  1 mg Oral Daily  . LORazepam  0-4 mg Intravenous 4 times per day   Followed by  . [START ON 05/04/2015] LORazepam  0-4 mg Intravenous Q12H  . multivitamin with minerals  1 tablet Oral Daily  . thiamine  100 mg Oral Daily   Or  . thiamine  100 mg Intravenous Daily   Continuous Infusions: . sodium chloride 125  mL/hr (05/03/15 0347)

## 2015-05-04 LAB — COMPREHENSIVE METABOLIC PANEL
ALBUMIN: 2.7 g/dL — AB (ref 3.5–5.0)
ALT: 26 U/L (ref 17–63)
ANION GAP: 7 (ref 5–15)
AST: 58 U/L — ABNORMAL HIGH (ref 15–41)
Alkaline Phosphatase: 57 U/L (ref 38–126)
BUN: <5 mg/dL — ABNORMAL LOW (ref 6–20)
CO2: 22 mmol/L (ref 22–32)
Calcium: 8.4 mg/dL — ABNORMAL LOW (ref 8.9–10.3)
Chloride: 103 mmol/L (ref 101–111)
Creatinine, Ser: 0.5 mg/dL — ABNORMAL LOW (ref 0.61–1.24)
GFR calc Af Amer: >60 mL/min (ref >60)
GFR calc non Af Amer: >60 mL/min (ref >60)
Glucose, Bld: 114 mg/dL — ABNORMAL HIGH (ref 65–99)
POTASSIUM: 3.3 mmol/L — AB (ref 3.5–5.1)
Sodium: 132 mmol/L — ABNORMAL LOW (ref 135–145)
Total Bilirubin: 1.1 mg/dL (ref 0.3–1.2)
Total Protein: 6 g/dL — ABNORMAL LOW (ref 6.5–8.1)

## 2015-05-04 LAB — CBC
HCT: 29.6 % — ABNORMAL LOW (ref 39.0–52.0)
Hemoglobin: 10.5 g/dL — ABNORMAL LOW (ref 13.0–17.0)
MCH: 32.3 pg (ref 26.0–34.0)
MCHC: 35.5 g/dL (ref 30.0–36.0)
MCV: 91.1 fL (ref 78.0–100.0)
Platelets: 95 10*3/uL — ABNORMAL LOW (ref 150–400)
RBC: 3.25 MIL/uL — ABNORMAL LOW (ref 4.22–5.81)
RDW: 16.9 % — ABNORMAL HIGH (ref 11.5–15.5)
WBC: 4.7 10*3/uL (ref 4.0–10.5)

## 2015-05-04 LAB — GLUCOSE, CAPILLARY
GLUCOSE-CAPILLARY: 103 mg/dL — AB (ref 65–99)
Glucose-Capillary: 121 mg/dL — ABNORMAL HIGH (ref 65–99)
Glucose-Capillary: 130 mg/dL — ABNORMAL HIGH (ref 65–99)
Glucose-Capillary: 92 mg/dL (ref 65–99)

## 2015-05-04 LAB — LIPASE, BLOOD: LIPASE: 720 U/L — AB (ref 22–51)

## 2015-05-04 MED ORDER — POTASSIUM CHLORIDE CRYS ER 20 MEQ PO TBCR
40.0000 meq | EXTENDED_RELEASE_TABLET | Freq: Once | ORAL | Status: AC
  Administered 2015-05-04: 40 meq via ORAL
  Filled 2015-05-04: qty 2

## 2015-05-04 MED ORDER — TRAMADOL HCL 50 MG PO TABS: 50.0000 mg | ORAL_TABLET | Freq: Four times a day (QID) | ORAL | Status: DC | PRN

## 2015-05-04 MED ORDER — ONDANSETRON HCL 4 MG PO TABS: 4.0000 mg | ORAL_TABLET | Freq: Four times a day (QID) | ORAL | Status: DC | PRN

## 2015-05-04 NOTE — Discharge Summary (Signed)
Physician Discharge Summary  Samuel [name] WJX:[id number] DOB: 1960/12/16 DOA: 05/01/2015  PCP: No PCP Per Patient  Admit date: 05/01/2015 Discharge date: 05/04/2015  Recommendations for Outpatient Follow-up:  1. Pt will need to follow up with PCP in 1 week post discharge 2. Please obtain BMP to evaluate electrolytes and kidney function, Mg and K level 3. Mg and K both supplemented prior to discharge and unable to check blood tests as pt insisted on going home 4. Pt made aware that lipase is trending up but he was very adamant  about going home despite being told he is at high risk for re hospitalization  5. Please also check CBC to evaluate Hg and Hct levels  Discharge Diagnoses:  Principal Problem:   Acute pancreatitis Active Problems:   Alcohol abuse    Discharge Condition: Stable  Diet recommendation: Heart healthy diet discussed in details    Brief narrative:    Pt admitted for evaluation of N/V, secondary to acute alcohol induced pancreatitis.   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  Family Communication: plan of care discussed with the patient Disposition Plan: Home    IV access:  Peripheral IV  Procedures and diagnostic studies:   Ct Abdomen Pelvis W Contrast 05/01/2015 Constellation of findings compatible with acute pancreatitis. Peripancreatic fluid is identified but no evidence for pseudocyst  formation or other complications such as bowel obstruction.   Medical Consultants:  None  Other Consultants:  None   IAnti-Infectives:   None       Discharge Exam: Filed Vitals:   05/04/15 0743  BP: 149/83  Pulse: 60  Temp: 98 F (36.7 C)  Resp: 17   Filed Vitals:   05/03/15 2017 05/04/15 0430 05/04/15 0500 05/04/15 0743  BP: 134/85 140/75  149/83  Pulse: 69 62  60  Temp: 99.2 F (37.3 C) 98.3 F (36.8 C)  98 F (36.7 C)  TempSrc: Oral Oral  Oral  Resp: 18 16  17   Height:      Weight: 52.1 kg (114 lb 13.8 oz)  52.1 kg (114 lb 13.8 oz)   SpO2: 99% 100%  100%    General: Pt is alert, follows commands appropriately, not in acute distress Cardiovascular: Regular rate and rhythm, S1/S2 +, no murmurs, no rubs, no gallops Respiratory: Clear to auscultation bilaterally, no wheezing, no crackles, no rhonchi Abdominal: Soft, non tender, non distended, bowel sounds +, no guarding Extremities: no edema, no cyanosis, pulses palpable bilaterally DP and PT Neuro: Grossly nonfocal  Discharge Instructions  Discharge Instructions    Diet - low sodium heart healthy    Complete by:  As directed      Increase activity slowly    Complete by:  As directed             Medication List    STOP taking these medications        ibuprofen 200 MG tablet  Commonly known as:  ADVIL,MOTRIN      TAKE these medications        acetaminophen 325 MG tablet  Commonly known as:  TYLENOL  Take 650 mg by mouth every 6 (six) hours as needed for mild pain, fever or headache.     ondansetron 4 MG tablet  Commonly known as:  ZOFRAN  Take 1 tablet (4 mg total) by mouth every 6 (six) hours as needed for nausea.     traMADol 50 MG tablet  Commonly known as:  ULTRAM  Take 1 tablet (50 mg total) by mouth every 6 (six) hours as needed.           Follow-up Information    Call Debbora Presto, MD.   Specialty:  Internal Medicine   Why:  As needed call my cell [phone]    Contact information:   [address] [place] [address] [phone]        The results of significant diagnostics from this hospitalization (including imaging, microbiology, ancillary and laboratory) are listed below for reference.     Microbiology: No results found for this or any previous visit (from the past 240 hour(s)).   Labs: Basic Metabolic Panel:  Recent Labs Lab 05/01/15 1835 05/02/15 0317 05/03/15 0510 05/04/15 0522  NA 133* 133* 130* 132*  K 3.9 3.1* 3.1* 3.3*  CL 94* 99* 99* 103  CO2 20* 21* 23 22  GLUCOSE 97 77 99 114*  BUN 7 6 <5* <5*  CREATININE 0.75 0.56* 0.48* 0.50*  CALCIUM 10.0 8.9 8.3* 8.4*  MG  --   --  1.3*  --    Liver Function Tests:  Recent Labs Lab 05/01/15 1835 05/02/15 0317 05/03/15 0510 05/04/15 0522  AST 120* 79* 58* 58*  ALT 43 31 24 26   ALKPHOS 68 57 51 57  BILITOT 1.6* 1.3* 1.2 1.1  PROT 8.6* 6.5 6.0* 6.0*  ALBUMIN 4.3 3.2* 2.8* 2.7*    Recent Labs Lab 05/01/15 1835 05/02/15 0317 05/03/15 0510 05/04/15 0522  LIPASE 1067* 651* 557* 720*   CBC:  Recent Labs Lab 05/01/15 1835 05/02/15 0317 05/03/15 0510 05/04/15 0522  WBC 6.0 6.4 5.5 4.7  NEUTROABS  --  4.4  --   --   HGB 13.0 10.9* 11.0* 10.5*  HCT 37.3* 31.7* 31.4* 29.6*  MCV 91.0 90.1 90.2 91.1  PLT 99* 84* 80* 95*    CBG:  Recent Labs Lab 05/03/15 1602 05/03/15 2006 05/03/15 2355 05/04/15 0401 05/04/15 0740  GLUCAP 130* 148* 130* 121* 103*     SIGNED: Time coordinating discharge: 30 minutes  MAGICK-Sowmya Partridge, MD  [facility] 05/04/2015, 9:36 AM Pager [phone]  If 7PM-7AM, please contact night-coverage [email or web address] Password [email or web address]

## 2015-05-04 NOTE — Progress Notes (Signed)
Pt d/c per MD order. Pt received d/c/packet and educational information on acute pancreatitis, etoh withdrawal, nutrition. IV removed. Patient left on foot with RN.

## 2015-05-04 NOTE — Discharge Instructions (Signed)

## 2015-09-22 ENCOUNTER — Encounter (HOSPITAL_COMMUNITY): Payer: Self-pay

## 2015-09-22 ENCOUNTER — Emergency Department (HOSPITAL_COMMUNITY): Payer: No Typology Code available for payment source

## 2015-09-22 ENCOUNTER — Emergency Department (HOSPITAL_COMMUNITY)
Admission: EM | Admit: 2015-09-22 | Discharge: 2015-09-22 | Disposition: A | Discharge status: Home / Self Care | Payer: No Typology Code available for payment source | Attending: Emergency Medicine | Admitting: Emergency Medicine

## 2015-09-22 DIAGNOSIS — M79641 Pain in right hand: Secondary | ICD-10-CM

## 2015-09-22 DIAGNOSIS — S6991XA Unspecified injury of right wrist, hand and finger(s), initial encounter: Secondary | ICD-10-CM | POA: Insufficient documentation

## 2015-09-22 DIAGNOSIS — X58XXXA Exposure to other specified factors, initial encounter: Secondary | ICD-10-CM | POA: Insufficient documentation

## 2015-09-22 DIAGNOSIS — Y9389 Activity, other specified: Secondary | ICD-10-CM | POA: Insufficient documentation

## 2015-09-22 DIAGNOSIS — F17210 Nicotine dependence, cigarettes, uncomplicated: Secondary | ICD-10-CM | POA: Insufficient documentation

## 2015-09-22 DIAGNOSIS — Y998 Other external cause status: Secondary | ICD-10-CM | POA: Insufficient documentation

## 2015-09-22 DIAGNOSIS — Z8719 Personal history of other diseases of the digestive system: Secondary | ICD-10-CM | POA: Insufficient documentation

## 2015-09-22 DIAGNOSIS — Y9289 Other specified places as the place of occurrence of the external cause: Secondary | ICD-10-CM | POA: Insufficient documentation

## 2015-09-22 DIAGNOSIS — Z7982 Long term (current) use of aspirin: Secondary | ICD-10-CM | POA: Insufficient documentation

## 2015-09-22 MED ORDER — TRAMADOL HCL 50 MG PO TABS
50.0000 mg | ORAL_TABLET | Freq: Four times a day (QID) | ORAL | Status: DC | PRN
Start: 2015-09-22 — End: 2016-11-24

## 2015-09-22 MED ORDER — INDOMETHACIN 25 MG PO CAPS: 25.0000 mg | ORAL_CAPSULE | Freq: Three times a day (TID) | ORAL | Status: DC | PRN

## 2015-09-22 MED ORDER — HYDROCODONE-ACETAMINOPHEN 5-325 MG PO TABS
2.0000 | ORAL_TABLET | Freq: Once | ORAL | Status: AC
  Administered 2015-09-22: 2 via ORAL
  Filled 2015-09-22: qty 2

## 2015-09-22 MED ORDER — NAPROXEN 500 MG PO TABS
500.0000 mg | ORAL_TABLET | Freq: Once | ORAL | Status: AC
  Administered 2015-09-22: 500 mg via ORAL
  Filled 2015-09-22: qty 1

## 2015-09-22 NOTE — ED Notes (Signed)
FAMILY to pick patient up from lobby

## 2015-09-22 NOTE — ED Provider Notes (Signed)
CSN: [id number]     Arrival date & time 09/22/15  2155 History  By signing my name below, I, Emmanuella Mensah, attest that this documentation has been prepared under the direction and in the presence of [employer], PA-C. Electronically Signed: Angelene Giovanni, ED Scribe. 09/22/2015. 10:51 PM.    Chief Complaint  Patient presents with  . Hand Injury   The history is provided by the patient. No language interpreter was used.   HPI Comments: Samuel [name] is a 54 y.o. male who presents to the Emergency Department complaining of gradually worsening constant moderate right hand and right wrist pain after waking up this am s/p possible fall that while he slept. He explains that he is unsure if he fell out of the bed while he was sleeping last night. He reports associated hand swelling and limited ROM secondary to pain. He denies any fever or numbness/tingling in his hand. No alleviating factors noted. 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 He adds that he is right hand dominant. He denies any previous injuries to his wrist hand or wrist. He also denies a hx of cancers or gout. He denies a hx of IV drug use.    Past Medical History  Diagnosis Date  . Pancreatitis    Past Surgical History  Procedure Laterality Date  . No past surgeries     Family History  Problem Relation Age of Onset  . Pancreatitis Neg Hx    Social History  Substance Use Topics  . Smoking status: Current Every Day Smoker    Types: Cigarettes  . Smokeless tobacco: None  . Alcohol Use: Yes    Review of Systems  Constitutional: Negative for fever.  Musculoskeletal: Positive for joint swelling and arthralgias.  Neurological: Negative for weakness and numbness.  All other systems reviewed and are negative.   Allergies  Review of patient's allergies indicates no known allergies.  Home Medications   Prior to Admission medications   Medication Sig  Start Date End Date Taking? Authorizing Provider  aspirin 325 MG tablet Take 975 mg by mouth every 6 (six) hours as needed for mild pain or headache.   Yes Historical Provider, MD  ondansetron (ZOFRAN) 4 MG tablet Take 1 tablet (4 mg total) by mouth every 6 (six) hours as needed for nausea. Patient not taking: Reported on 09/22/2015 05/04/15   Dorothea Ogle, MD  traMADol (ULTRAM) 50 MG tablet Take 1 tablet (50 mg total) by mouth every 6 (six) hours as needed. Patient not taking: Reported on 09/22/2015 05/04/15   Dorothea Ogle, MD   BP 139/76 mmHg  Pulse 66  Temp(Src) 98.2 F (36.8 C) (Oral)  Resp 20  SpO2 98%   Physical Exam  Constitutional: He is oriented to person, place, and time. He appears well-developed and well-nourished. No distress.  Nontoxic/nonseptic appearing  HENT:  Head: Normocephalic and atraumatic.  Eyes: Conjunctivae and EOM are normal. No scleral icterus.  Neck: Normal range of motion.  Cardiovascular: Normal rate, regular rhythm and intact distal pulses.   Distal radial pulse 2+ in the right upper extremity  Pulmonary/Chest: Effort normal. No respiratory distress.  Musculoskeletal: Normal range of motion. He exhibits tenderness.       Right hand: He exhibits tenderness and swelling (mild just proximal to the 2nd and 3rd MCP joints). He exhibits no bony tenderness, normal capillary refill and no deformity. Normal sensation noted. Normal strength noted.  Hands: No erythema or heat to touch noted in the R hand. No pitting edema.  Neurological: He is alert and oriented to person, place, and time. He exhibits normal muscle tone. Coordination normal.  Sensation to light touch intact. Patient able to wiggle all fingers.  Skin: Skin is warm and dry. No rash noted. He is not diaphoretic. No erythema. No pallor.  Psychiatric: He has a normal mood and affect. His behavior is normal.  Nursing note and vitals reviewed.   ED Course  Procedures (including critical care  time) DIAGNOSTIC STUDIES: Oxygen Saturation is 98% on RA, normal by my interpretation.    COORDINATION OF CARE: 10:48 PM- Pt advised of plan for treatment and pt agrees. Informed pt of x-ray results. Will prescribe Naprosyn and Vicodin.   Imaging Review Dg Hand Complete Right  09/22/2015  CLINICAL DATA:  Acute onset of diffuse right hand pain and swelling. Initial encounter. EXAM: RIGHT HAND - COMPLETE 3+ VIEW COMPARISON:  None. FINDINGS: There is no evidence of fracture or dislocation. The joint spaces are preserved. The carpal rows are intact, and demonstrate normal alignment. The soft tissues are unremarkable in appearance. IMPRESSION: No evidence of fracture or dislocation. Electronically Signed   By: Roanna RaiderJeffery  Chang M.D.   On: 09/22/2015 22:46     Antony MaduraKelly Ezel Vallone, PA-C has personally reviewed and evaluated these images and lab results as part of her medical decision-making.   MDM   Final diagnoses:  Right hand pain    Patient presents for right hand pain which occurred this evening. 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 Patient has no history of cancer for pathologic fracture. X-ray shows no fracture or dislocation today. Patient also has no swelling or erythema distinct cleat to a joint to suggest septic joint. He is also afebrile. No history of IV drug use. Doubt hemarthrosis. Patient is neurovascularly intact with good capillary refill. His sensation is grossly intact. Suspect osteoarthritis or occupational overuse. Given that most of his pain and tenderness is located to his thenar eminence and dorsal hand and wrist, will stabilize with thumb spica splint. Patient given NSAIDs as well as tramadol for pain. Patient referred to hand specialist for follow-up should symptoms persist. Return precautions discussed and provided. Patient agreeable to plan with no unaddressed concerns. Patient discharged in good condition.  I  personally performed the services described in this documentation, which was scribed in my presence. The recorded information has been reviewed and is accurate.    Filed Vitals:   09/22/15 2202  BP: 139/76  Pulse: 66  Temp: 98.2 F (36.8 C)  TempSrc: Oral  Resp: 20  SpO2: 98%     Antony MaduraKelly Jeryl Umholtz, PA-C 09/22/15 2313  Nelva Nayobert Beaton, MD 09/23/15 [phone]

## 2015-09-22 NOTE — ED Notes (Signed)
Pt presents with c/o right hand injury. Pt reports that he does not remember how he injured his hand but he "thinks he fell off of the bed".

## 2015-09-22 NOTE — Discharge Instructions (Signed)
Take indomethacin as prescribed and tramadol as needed for severe pain. Wear a wrist brace to try and prevent worsening symptoms. Ice your hands 3-4 times per day for 15-20 minutes each time. Follow-up with a hand specialist if symptoms persist. Return to the emergency Department if you develop fever or worsening pain or swelling despite recommend treatment.  Osteoarthritis Osteoarthritis is a disease that causes soreness and inflammation of a joint. It occurs when the cartilage at the affected joint wears down. Cartilage acts as a cushion, covering the ends of bones where they meet to form a joint. Osteoarthritis is the most common form of arthritis. It often occurs in older people. The joints affected most often by this condition include those in the:  Ends of the fingers.  Thumbs.  Neck.  Lower back.  Knees.  Hips. CAUSES  Over time, the cartilage that covers the ends of bones begins to wear away. This causes bone to rub on bone, producing pain and stiffness in the affected joints.  RISK FACTORS Certain factors can increase your chances of having osteoarthritis, including:  Older age.  Excessive body weight.  Overuse of joints.  Previous joint injury. SIGNS AND SYMPTOMS   Pain, swelling, and stiffness in the joint.  Over time, the joint may lose its normal shape.  Small deposits of bone (osteophytes) may grow on the edges of the joint.  Bits of bone or cartilage can break off and float inside the joint space. This may cause more pain and damage. DIAGNOSIS  Your health care provider will do a physical exam and ask about your symptoms. Various tests may be ordered, such as:  X-rays of the affected joint.  Blood tests to rule out other types of arthritis. Additional tests may be used to diagnose your condition. TREATMENT  Goals of treatment are to control pain and improve joint function. Treatment plans may include:  A prescribed exercise program that allows for rest  and joint relief.  A weight control plan.  Pain relief techniques, such as:  Properly applied heat and cold.  Electric pulses delivered to nerve endings under the skin (transcutaneous electrical nerve stimulation [TENS]).  Massage.  Certain nutritional supplements.  Medicines to control pain, such as:  Acetaminophen.  Nonsteroidal anti-inflammatory drugs (NSAIDs), such as naproxen.  Narcotic or central-acting agents, such as tramadol.  Corticosteroids. These can be given orally or as an injection.  Surgery to reposition the bones and relieve pain (osteotomy) or to remove loose pieces of bone and cartilage. Joint replacement may be needed in advanced states of osteoarthritis. HOME CARE INSTRUCTIONS   Take medicines only as directed by your health care provider.  Maintain a healthy weight. Follow your health care provider's instructions for weight control. This may include dietary instructions.  Exercise as directed. Your health care provider can recommend specific types of exercise. These may include:  Strengthening exercises. These are done to strengthen the muscles that support joints affected by arthritis. They can be performed with weights or with exercise bands to add resistance.  Aerobic activities. These are exercises, such as brisk walking or low-impact aerobics, that get your heart pumping.  Range-of-motion activities. These keep your joints limber.  Balance and agility exercises. These help you maintain daily living skills.  Rest your affected joints as directed by your health care provider.  Keep all follow-up visits as directed by your health care provider. SEEK MEDICAL CARE IF:   Your skin turns red.  You develop a rash in addition to  your joint pain.  You have worsening joint pain.  You have a fever along with joint or muscle aches. SEEK IMMEDIATE MEDICAL CARE IF:  You have a significant loss of weight or appetite.  You have night sweats. FOR  MORE INFORMATION   National Institute of Arthritis and Musculoskeletal and Skin Diseases: www.niams.http://www.myers.net/  [employer] on Aging: https://walker.com/  American College of Rheumatology: www.rheumatology.org   This information is not intended to replace advice given to you by your health care provider. Make sure you discuss any questions you have with your health care provider.   Document Released: 09/26/2005 Document Revised: 10/17/2014 Document Reviewed: 06/03/2013 Elsevier Interactive Patient Education 2016 Elsevier Inc.  Cryotherapy Cryotherapy means treatment with cold. Ice or gel packs can be used to reduce both pain and swelling. Ice is the most helpful within the first 24 to 48 hours after an injury or flare-up from overusing a muscle or joint. Sprains, strains, spasms, burning pain, shooting pain, and aches can all be eased with ice. Ice can also be used when recovering from surgery. Ice is effective, has very few side effects, and is safe for most people to use. PRECAUTIONS  Ice is not a safe treatment option for people with:  Raynaud phenomenon. This is a condition affecting small blood vessels in the extremities. Exposure to cold may cause your problems to return.  Cold hypersensitivity. There are many forms of cold hypersensitivity, including:  Cold urticaria. Red, itchy hives appear on the skin when the tissues begin to warm after being iced.  Cold erythema. This is a red, itchy rash caused by exposure to cold.  Cold hemoglobinuria. Red blood cells break down when the tissues begin to warm after being iced. The hemoglobin that carry oxygen are passed into the urine because they cannot combine with blood proteins fast enough.  Numbness or altered sensitivity in the area being iced. If you have any of the following conditions, do not use ice until you have discussed cryotherapy with your caregiver:  Heart conditions, such as arrhythmia, angina, or chronic heart  disease.  High blood pressure.  Healing wounds or open skin in the area being iced.  Current infections.  Rheumatoid arthritis.  Poor circulation.  Diabetes. Ice slows the blood flow in the region it is applied. This is beneficial when trying to stop inflamed tissues from spreading irritating chemicals to surrounding tissues. However, if you expose your skin to cold temperatures for too long or without the proper protection, you can damage your skin or nerves. Watch for signs of skin damage due to cold. HOME CARE INSTRUCTIONS Follow these tips to use ice and cold packs safely.  Place a dry or damp towel between the ice and skin. A damp towel will cool the skin more quickly, so you may need to shorten the time that the ice is used.  For a more rapid response, add gentle compression to the ice.  Ice for no more than 10 to 20 minutes at a time. The bonier the area you are icing, the less time it will take to get the benefits of ice.  Check your skin after 5 minutes to make sure there are no signs of a poor response to cold or skin damage.  Rest 20 minutes or more between uses.  Once your skin is numb, you can end your treatment. You can test numbness by very lightly touching your skin. The touch should be so light that you do not see the skin dimple  from the pressure of your fingertip. When using ice, most people will feel these normal sensations in this order: cold, burning, aching, and numbness.  Do not use ice on someone who cannot communicate their responses to pain, such as small children or people with dementia. HOW TO MAKE AN ICE PACK Ice packs are the most common way to use ice therapy. Other methods include ice massage, ice baths, and cryosprays. Muscle creams that cause a cold, tingly feeling do not offer the same benefits that ice offers and should not be used as a substitute unless recommended by your caregiver. To make an ice pack, do one of the following:  Place crushed  ice or a bag of frozen vegetables in a sealable plastic bag. Squeeze out the excess air. Place this bag inside another plastic bag. Slide the bag into a pillowcase or place a damp towel between your skin and the bag.  Mix 3 parts water with 1 part rubbing alcohol. Freeze the mixture in a sealable plastic bag. When you remove the mixture from the freezer, it will be slushy. Squeeze out the excess air. Place this bag inside another plastic bag. Slide the bag into a pillowcase or place a damp towel between your skin and the bag. SEEK MEDICAL CARE IF:  You develop white spots on your skin. This may give the skin a blotchy (mottled) appearance.  Your skin turns blue or pale.  Your skin becomes waxy or hard.  Your swelling gets worse. MAKE SURE YOU:   Understand these instructions.  Will watch your condition.  Will get help right away if you are not doing well or get worse.   This information is not intended to replace advice given to you by your health care provider. Make sure you discuss any questions you have with your health care provider.   Document Released: 05/23/2011 Document Revised: 10/17/2014 Document Reviewed: 05/23/2011 Elsevier Interactive Patient Education [employer]2016 Elsevier Inc.

## 2016-07-12 ENCOUNTER — Encounter (HOSPITAL_COMMUNITY): Payer: Self-pay | Admitting: *Deleted

## 2016-07-12 ENCOUNTER — Emergency Department (HOSPITAL_COMMUNITY)
Admission: EM | Admit: 2016-07-12 | Discharge: 2016-07-12 | Disposition: A | Discharge status: Home / Self Care | Payer: No Typology Code available for payment source | Attending: Emergency Medicine | Admitting: Emergency Medicine

## 2016-07-12 ENCOUNTER — Emergency Department (HOSPITAL_COMMUNITY): Payer: Self-pay

## 2016-07-12 DIAGNOSIS — Z7982 Long term (current) use of aspirin: Secondary | ICD-10-CM | POA: Insufficient documentation

## 2016-07-12 DIAGNOSIS — F17210 Nicotine dependence, cigarettes, uncomplicated: Secondary | ICD-10-CM | POA: Insufficient documentation

## 2016-07-12 DIAGNOSIS — R55 Syncope and collapse: Secondary | ICD-10-CM | POA: Insufficient documentation

## 2016-07-12 LAB — CBC WITH DIFFERENTIAL/PLATELET
BASOS PCT: 1 %
Basophils Absolute: 0.1 10*3/uL (ref 0.0–0.1)
EOS PCT: 1 %
Eosinophils Absolute: 0 10*3/uL (ref 0.0–0.7)
HEMATOCRIT: 40.5 % (ref 39.0–52.0)
Hemoglobin: 13.5 g/dL (ref 13.0–17.0)
Lymphocytes Relative: 27 %
Lymphs Abs: 1.6 10*3/uL (ref 0.7–4.0)
MCH: 31.4 pg (ref 26.0–34.0)
MCHC: 33.3 g/dL (ref 30.0–36.0)
MCV: 94.2 fL (ref 78.0–100.0)
MONO ABS: 0.6 10*3/uL (ref 0.1–1.0)
MONOS PCT: 10 %
NEUTROS ABS: 3.7 10*3/uL (ref 1.7–7.7)
Neutrophils Relative %: 61 %
Platelets: 197 10*3/uL (ref 150–400)
RBC: 4.3 MIL/uL (ref 4.22–5.81)
RDW: 13.1 % (ref 11.5–15.5)
WBC: 6 10*3/uL (ref 4.0–10.5)

## 2016-07-12 LAB — COMPREHENSIVE METABOLIC PANEL
ALK PHOS: 54 U/L (ref 38–126)
ALT: 30 U/L (ref 17–63)
ANION GAP: 5 (ref 5–15)
AST: 74 U/L — ABNORMAL HIGH (ref 15–41)
Albumin: 3.9 g/dL (ref 3.5–5.0)
BILIRUBIN TOTAL: 1.6 mg/dL — AB (ref 0.3–1.2)
BUN: 6 mg/dL (ref 6–20)
CO2: 28 mmol/L (ref 22–32)
CREATININE: 0.77 mg/dL (ref 0.61–1.24)
Calcium: 8.7 mg/dL — ABNORMAL LOW (ref 8.9–10.3)
Chloride: 106 mmol/L (ref 101–111)
GFR calc non Af Amer: >60 mL/min (ref >60)
GLUCOSE: 94 mg/dL (ref 65–99)
Potassium: 3.7 mmol/L (ref 3.5–5.1)
SODIUM: 139 mmol/L (ref 135–145)
TOTAL PROTEIN: 7.1 g/dL (ref 6.5–8.1)

## 2016-07-12 LAB — I-STAT TROPONIN, ED: Troponin i, poc: 0.01 ng/mL (ref 0.00–0.08)

## 2016-07-12 LAB — ETHANOL: Alcohol, Ethyl (B): 223 mg/dL — ABNORMAL HIGH (ref <5)

## 2016-07-12 NOTE — Care Management Note (Signed)
Case Management Note  Patient Details  Name: Samuel [name] MRN: [id number] Date of Birth: 1961-05-30  Subjective/Objective:                  55 y.o. male. He is here complaining of symptoms of lightheadedness. He endorses multiple episodes of lightheadedness and syncopal symptoms which seem to last about 2 hours each episode. From home.  Action/Plan: Follow for disposition needs. Fermin Schwab/Provide list of PCPs.   Expected Discharge Date:  07/12/16               Expected Discharge Plan:  Home/Self Care  In-House Referral:  NA  Discharge planning Services  CM Consult  Post Acute Care Choice:  NA Choice offered to:  NA  DME Arranged:  N/A DME Agency:  NA  HH Arranged:  NA HH Agency:  NA  Status of Service:  Completed, signed off  If discussed at Long Length of Stay Meetings, dates discussed:    Additional Comments: EDCM consulted to assist pt with PCP establishment.  Pt has [employer] insurance; NCM provided instructions on calling number on back of card to obtain list of PCPs in area accepting his insurance along with a list of some known physician's offices accepting new pts.  No further needs communicated at this time. Oletta CohnWood, Jamyron Redd, RN 07/12/2016, 10:30 AM

## 2016-07-12 NOTE — ED Triage Notes (Signed)
Patient comes in with c/o dizziness that started yesterday. Patient states he feels fine when he's sitting but feels dizzy when he stands. No significant hx. He states this has happened a few times before. He's unsure if he has HTN. Denies h/a. Only states he's got chronic right shoulder pain.

## 2016-07-12 NOTE — ED Provider Notes (Signed)
[facility]PT Provider Note   CSN: [id number] Arrival date & time: 07/12/16  0747     History   Chief Complaint Chief Complaint  Patient presents with  . Dizziness    HPI Samuel [name] is a 55 y.o. male. He is here complaining of symptoms of lightheadedness. He endorses multiple episodes of lightheadedness and syncopal symptoms which seem to last about 2 hours each episode. Episodes began approximately 2 months ago. Most recent episode occurred yesterday while he was at work. At that time he endorses lightheadedness and the feeling as though he was going to lose consciousness. He endorses some coldness and clamminess. He states his coworkers had informed him that he "looked a different color". He denies any nausea, vomiting, chest pain, or spinning sensation during these episodes. He states that he has some shortness of breath but this appears to be relatively normal for him. Episodes seem to subside after approximately 2 hours of laying down. Her symptoms worsen the longer he persists on his feet once symptoms initiate. He denies any recent fever, chills, weight loss, weight gain, peripheral edema, abdominal pain, diarrhea, constipation, vision changes, cough, sputum production, numbness, weakness, paresthesias, or headache.  Patient states that he currently is working 2 jobs and regularly works approximately 4 hours a day. He is also spending much time caring for his elderly parents. He endorses drinking approximately one 6 pack daily of beer. He is a current smoker with a ~30-pack-year history.   Dizziness  Associated symptoms: shortness of breath   Associated symptoms: no chest pain, no diarrhea, no headaches, no palpitations, no vomiting and no weakness     Past Medical History:  Diagnosis Date  . Pancreatitis     Patient Active Problem List   Diagnosis Date Noted  . Pancreatitis 05/01/2015  . HYPOKALEMIA 07/28/2010  . TOBACCO ABUSE 07/28/2010  . COCAINE ABUSE  07/28/2010  . NEUROPATHY 07/28/2010  . HYPONATREMIA 07/12/2010  . Alcohol abuse 07/12/2010  . Acute pancreatitis 07/12/2010    Past Surgical History:  Procedure Laterality Date  . NO PAST SURGERIES         Home Medications    Prior to Admission medications   Medication Sig Start Date End Date Taking? Authorizing Provider  aspirin 325 MG tablet Take 975 mg by mouth every 6 (six) hours as needed for mild pain or headache.    Historical Provider, MD  indomethacin (INDOCIN) 25 MG capsule Take 1 capsule (25 mg total) by mouth 3 (three) times daily as needed. 09/22/15   Antony Madura, PA-C  ondansetron (ZOFRAN) 4 MG tablet Take 1 tablet (4 mg total) by mouth every 6 (six) hours as needed for nausea. Patient not taking: Reported on 09/22/2015 05/04/15   Dorothea Ogle, MD  traMADol (ULTRAM) 50 MG tablet Take 1 tablet (50 mg total) by mouth every 6 (six) hours as needed. 09/22/15   Antony Madura, PA-C    Family History Family History  Problem Relation Age of Onset  . Pancreatitis Neg Hx     Social History Social History  Substance Use Topics  . Smoking status: Current Every Day Smoker    Packs/day: 1.00    Types: Cigarettes  . Smokeless tobacco: Current User  . Alcohol use Yes     Allergies   Review of patient's allergies indicates no known allergies.   Review of Systems Review of Systems  Constitutional: Positive for activity change and diaphoresis. Negative for chills, fever and unexpected weight change.  HENT: Negative  for congestion and rhinorrhea.   Eyes: Negative for photophobia and visual disturbance.  Respiratory: Positive for shortness of breath. Negative for cough, chest tightness and wheezing.   Cardiovascular: Negative for chest pain, palpitations and leg swelling.  Gastrointestinal: Negative for abdominal pain, diarrhea and vomiting.  Genitourinary: Negative for dysuria and flank pain.  Neurological: Positive for light-headedness. Negative for dizziness,  tremors, syncope, weakness, numbness and headaches.     Physical Exam Updated Vital Signs BP 116/81 (BP Location: Right Arm) Comment: Simultaneous filing. User may not have seen previous data.  Pulse 64 Comment: Simultaneous filing. User may not have seen previous data.  Resp 16 Comment: Simultaneous filing. User may not have seen previous data.  Ht 5\' 5"  (1.651 m)   Wt 61.2 kg   SpO2 100% Comment: Simultaneous filing. User may not have seen previous data.  BMI 22.47 kg/m   Physical Exam  Constitutional: He is oriented to person, place, and time. He appears well-developed. No distress.  HENT:  Head: Normocephalic and atraumatic.  Right Ear: External ear normal.  Left Ear: External ear normal.  Nose: Nose normal.  Mouth/Throat: Oropharynx is clear and moist.  Eyes: Conjunctivae and EOM are normal. Pupils are equal, round, and reactive to light. No scleral icterus.  Neck: Normal range of motion. No JVD present. No thyromegaly present.  Cardiovascular: Normal rate, regular rhythm, normal heart sounds and intact distal pulses.   No murmur heard. Distant heart sounds  Pulmonary/Chest: Effort normal and breath sounds normal. No stridor. No respiratory distress. He has no wheezes. He has no rales.  Prolonged expiratory phase  Abdominal: Soft. Bowel sounds are normal. He exhibits no distension. There is no tenderness. There is no rebound and no guarding.  Musculoskeletal: Normal range of motion. He exhibits no edema, tenderness or deformity.  Digital clubbing present bilaterally  Lymphadenopathy:    He has no cervical adenopathy.  Neurological: He is alert and oriented to person, place, and time. He has normal strength and normal reflexes. He is not disoriented. No cranial nerve deficit or sensory deficit. He exhibits normal muscle tone. He displays a negative Romberg sign. Coordination normal. GCS eye subscore is 4. GCS verbal subscore is 5. GCS motor subscore is 6.  Normal/symmetric  rapid alternating movements. Normal/symmetric finger to nose test.  Skin: Skin is warm and dry. Capillary refill takes less than 2 seconds. No rash noted. He is not diaphoretic.  Psychiatric: He has a normal mood and affect. His behavior is normal. Thought content normal.     ED Treatments / Results  Labs (all labs ordered are listed, but only abnormal results are displayed) Labs Reviewed  COMPREHENSIVE METABOLIC PANEL - Abnormal; Notable for the following:       Result Value   Calcium 8.7 (*)    AST 74 (*)    Total Bilirubin 1.6 (*)    All other components within normal limits  ETHANOL - Abnormal; Notable for the following:    Alcohol, Ethyl (B) 223 (*)    All other components within normal limits  CBC WITH DIFFERENTIAL/PLATELET  Rosezena Sensor, ED    EKG  EKG Interpretation  Date/Time:  Tuesday July 12 2016 10:00:07 EDT Ventricular Rate:  60 PR Interval:    QRS Duration: 86 QT Interval:  410 QTC Calculation: 410 R Axis:   -54 Text Interpretation:  Ectopic atrial rhythm Left anterior fascicular block Anterior infarct, old ST elevation,  early repolarization No significant change since last tracing Confirmed by [facility]  MD, Alphonzo LemmingsWHITNEY [phone]([id number]) on 07/12/2016 10:04:37 AM       Radiology Dg Chest 2 View  Result Date: 07/12/2016 CLINICAL DATA:  Pre-syncope.  Weakness. EXAM: CHEST  2 VIEW COMPARISON:  07/01/2009 chest radiograph. FINDINGS: Stable cardiomediastinal silhouette with normal heart size. No pneumothorax. No pleural effusion. Lungs appear clear, with no acute consolidative airspace disease and no pulmonary edema. IMPRESSION: No active cardiopulmonary disease. Electronically Signed   By: Delbert PhenixJason A Poff M.D.   On: 07/12/2016 09:24    Procedures Procedures (including critical care time)  Medications Ordered in ED Medications - No data to display   Initial Impression / Assessment and Plan / ED Course  I have reviewed the triage vital signs and the nursing  notes.  Pertinent labs & imaging results that were available during my care of the patient were reviewed by me and considered in my medical decision making (see chart for details).  Clinical Course   Patient's lightheadedness seems to be likely related to dehydration and exhaustion. Patient is currently asymptomatic and denies any symptoms concerning for cardiac etiology like chest pain/pressure, dyspnea, dyspnea on exertion, nausea/vomiting, or peripheral edema. Patient has been working 2 jobs and caring for his parents. Both jobs require significant physical activity. He endorses significant perspiration during work secondary to the activity required. He also endorses regular alcohol consumption and has a elevated ethanol level of 223 this morning. CMP yielded AST of 74 and total bilirubin of 1.6, suggesting underlying liver dysfunction. All other CMP values were normal. CBC and i-STAT troponin were unremarkable. EKG obtained today yielded some elevated electrical activity likely secondary to his thin body type, and had no evidence of acute ischemia. Chest x-ray showed significantly expanded pleural fields suggesting COPD, without signs of cardiomegaly or vascular congestion.  Patient instructed to increase fluid consumption with water. He was also informed of the dehydrating effects of alcohol consumption reducing his overall alcohol use may likely help prevent future episodes of dehydration. Case management provided information on available sites to establish with a PCP. Return precautions provided prior to discharge.    Final Clinical Impressions(s) / ED Diagnoses   Final diagnoses:  Near syncope    New Prescriptions New Prescriptions   No medications on file     Kathee DeltonIan D Fabion Gatson, MD 07/12/16 1032    Gwyneth SproutWhitney Plunkett, MD 07/12/16 2159

## 2016-10-30 ENCOUNTER — Encounter (HOSPITAL_COMMUNITY): Payer: Self-pay | Admitting: Emergency Medicine

## 2016-10-30 ENCOUNTER — Emergency Department (HOSPITAL_COMMUNITY): Payer: Self-pay

## 2016-10-30 ENCOUNTER — Emergency Department (HOSPITAL_COMMUNITY)
Admission: EM | Admit: 2016-10-30 | Discharge: 2016-10-30 | Disposition: A | Discharge status: Home / Self Care | Payer: Self-pay | Attending: Emergency Medicine | Admitting: Emergency Medicine

## 2016-10-30 DIAGNOSIS — Z720 Tobacco use: Secondary | ICD-10-CM

## 2016-10-30 DIAGNOSIS — Z79899 Other long term (current) drug therapy: Secondary | ICD-10-CM | POA: Insufficient documentation

## 2016-10-30 DIAGNOSIS — J209 Acute bronchitis, unspecified: Secondary | ICD-10-CM | POA: Insufficient documentation

## 2016-10-30 DIAGNOSIS — R197 Diarrhea, unspecified: Secondary | ICD-10-CM | POA: Insufficient documentation

## 2016-10-30 DIAGNOSIS — F17210 Nicotine dependence, cigarettes, uncomplicated: Secondary | ICD-10-CM | POA: Insufficient documentation

## 2016-10-30 DIAGNOSIS — R112 Nausea with vomiting, unspecified: Secondary | ICD-10-CM | POA: Insufficient documentation

## 2016-10-30 DIAGNOSIS — F1010 Alcohol abuse, uncomplicated: Secondary | ICD-10-CM | POA: Insufficient documentation

## 2016-10-30 LAB — COMPREHENSIVE METABOLIC PANEL
ALK PHOS: 48 U/L (ref 38–126)
ALT: 46 U/L (ref 17–63)
ANION GAP: 10 (ref 5–15)
AST: 50 U/L — ABNORMAL HIGH (ref 15–41)
Albumin: 3.8 g/dL (ref 3.5–5.0)
BUN: 10 mg/dL (ref 6–20)
CALCIUM: 8.7 mg/dL — AB (ref 8.9–10.3)
CO2: 23 mmol/L (ref 22–32)
Chloride: 103 mmol/L (ref 101–111)
Creatinine, Ser: 0.72 mg/dL (ref 0.61–1.24)
GFR calc non Af Amer: >60 mL/min (ref >60)
Glucose, Bld: 102 mg/dL — ABNORMAL HIGH (ref 65–99)
POTASSIUM: 4 mmol/L (ref 3.5–5.1)
SODIUM: 136 mmol/L (ref 135–145)
TOTAL PROTEIN: 7.2 g/dL (ref 6.5–8.1)
Total Bilirubin: 0.7 mg/dL (ref 0.3–1.2)

## 2016-10-30 LAB — RAPID URINE DRUG SCREEN, HOSP PERFORMED
AMPHETAMINES: NOT DETECTED
BENZODIAZEPINES: NOT DETECTED
Barbiturates: NOT DETECTED
COCAINE: NOT DETECTED
OPIATES: NOT DETECTED
Tetrahydrocannabinol: NOT DETECTED

## 2016-10-30 LAB — URINALYSIS, ROUTINE W REFLEX MICROSCOPIC
Bilirubin Urine: NEGATIVE
Glucose, UA: NEGATIVE mg/dL
Hgb urine dipstick: NEGATIVE
Ketones, ur: NEGATIVE mg/dL
Leukocytes, UA: NEGATIVE
NITRITE: NEGATIVE
Protein, ur: NEGATIVE mg/dL
SPECIFIC GRAVITY, URINE: 1.015 (ref 1.005–1.030)
pH: 5 (ref 5.0–8.0)

## 2016-10-30 LAB — CBC
HCT: 37.3 % — ABNORMAL LOW (ref 39.0–52.0)
HEMOGLOBIN: 13.1 g/dL (ref 13.0–17.0)
MCH: 32 pg (ref 26.0–34.0)
MCHC: 35.1 g/dL (ref 30.0–36.0)
MCV: 91.2 fL (ref 78.0–100.0)
Platelets: 195 10*3/uL (ref 150–400)
RBC: 4.09 MIL/uL — AB (ref 4.22–5.81)
RDW: 12.9 % (ref 11.5–15.5)
WBC: 5.8 10*3/uL (ref 4.0–10.5)

## 2016-10-30 LAB — ETHANOL: Alcohol, Ethyl (B): 202 mg/dL — ABNORMAL HIGH (ref <5)

## 2016-10-30 LAB — LIPASE, BLOOD: Lipase: 24 U/L (ref 11–51)

## 2016-10-30 MED ORDER — AEROCHAMBER PLUS W/MASK MISC
1.0000 | Freq: Once | Status: AC
  Administered 2016-10-30: 1
  Filled 2016-10-30: qty 1

## 2016-10-30 MED ORDER — KETOROLAC TROMETHAMINE 30 MG/ML IJ SOLN
30.0000 mg | Freq: Once | INTRAMUSCULAR | Status: AC
  Administered 2016-10-30: 30 mg via INTRAVENOUS
  Filled 2016-10-30: qty 1

## 2016-10-30 MED ORDER — ALBUTEROL SULFATE HFA 108 (90 BASE) MCG/ACT IN AERS
1.0000 | INHALATION_SPRAY | RESPIRATORY_TRACT | Status: DC | PRN
  Administered 2016-10-30: 2 via RESPIRATORY_TRACT
  Filled 2016-10-30: qty 6.7

## 2016-10-30 MED ORDER — SODIUM CHLORIDE 0.9 % IV BOLUS (SEPSIS)
1000.0000 mL | Freq: Once | INTRAVENOUS | Status: AC
  Administered 2016-10-30: 1000 mL via INTRAVENOUS

## 2016-10-30 MED ORDER — IPRATROPIUM-ALBUTEROL 0.5-2.5 (3) MG/3ML IN SOLN
3.0000 mL | Freq: Once | RESPIRATORY_TRACT | Status: AC
  Administered 2016-10-30: 3 mL via RESPIRATORY_TRACT
  Filled 2016-10-30: qty 3

## 2016-10-30 MED ORDER — PROMETHAZINE HCL 25 MG PO TABS: 25.0000 mg | ORAL_TABLET | Freq: Four times a day (QID) | ORAL | 0 refills | Status: DC | PRN

## 2016-10-30 NOTE — ED Provider Notes (Signed)
[facility] Provider Note   CSN: [id number] Arrival date & time: 10/30/16  1444     History   Chief Complaint Chief Complaint  Patient presents with  . Diarrhea  . Generalized Body Aches  . Cough    HPI Samuel [name] is a 56 y.o. male.  Pt presents to the ED with a 1 week hx of cough, sob, fevers, body aches, and diarrhea.  Pt has not taken any of his meds for sx.  He has not been eating or drinking much.  What he does eat, goes right through him.  He denies any recent abx.      Past Medical History:  Diagnosis Date  . Pancreatitis     Patient Active Problem List   Diagnosis Date Noted  . Pancreatitis 05/01/2015  . HYPOKALEMIA 07/28/2010  . TOBACCO ABUSE 07/28/2010  . COCAINE ABUSE 07/28/2010  . NEUROPATHY 07/28/2010  . HYPONATREMIA 07/12/2010  . Alcohol abuse 07/12/2010  . Acute pancreatitis 07/12/2010    Past Surgical History:  Procedure Laterality Date  . NO PAST SURGERIES         Home Medications    Prior to Admission medications   Medication Sig Start Date End Date Taking? Authorizing Provider  aspirin 325 MG tablet Take 975 mg by mouth every 6 (six) hours as needed for mild pain or headache.    Historical Provider, MD  indomethacin (INDOCIN) 25 MG capsule Take 1 capsule (25 mg total) by mouth 3 (three) times daily as needed. 09/22/15   Antony Madura, PA-C  ondansetron (ZOFRAN) 4 MG tablet Take 1 tablet (4 mg total) by mouth every 6 (six) hours as needed for nausea. Patient not taking: Reported on 09/22/2015 05/04/15   Dorothea Ogle, MD  promethazine (PHENERGAN) 25 MG tablet Take 1 tablet (25 mg total) by mouth every 6 (six) hours as needed for nausea or vomiting. 10/30/16   Jacalyn Lefevre, MD  traMADol (ULTRAM) 50 MG tablet Take 1 tablet (50 mg total) by mouth every 6 (six) hours as needed. 09/22/15   Antony Madura, PA-C    Family History Family History  Problem Relation Age of Onset  . Pancreatitis Neg Hx     Social History Social  History  Substance Use Topics  . Smoking status: Current Every Day Smoker    Packs/day: 1.00    Types: Cigarettes  . Smokeless tobacco: Current User  . Alcohol use Yes     Allergies   Patient has no known allergies.   Review of Systems Review of Systems  Constitutional: Positive for appetite change, chills and fever.  Respiratory: Positive for cough and wheezing.   Gastrointestinal: Positive for diarrhea.  Neurological: Positive for weakness.  All other systems reviewed and are negative.    Physical Exam Updated Vital Signs BP 118/82   Pulse (!) 55   Temp 97.9 F (36.6 C) (Oral)   Resp 18   Ht 5\' 5"  (1.651 m)   Wt 142 lb (64.4 kg)   SpO2 98%   BMI 23.63 kg/m   Physical Exam  Constitutional: He is oriented to person, place, and time. He appears well-developed and well-nourished.  HENT:  Head: Normocephalic and atraumatic.  Right Ear: External ear normal.  Left Ear: External ear normal.  Nose: Nose normal.  Mouth/Throat: Oropharynx is clear and moist.  Eyes: Conjunctivae and EOM are normal. Pupils are equal, round, and reactive to light.  Neck: Normal range of motion. Neck supple.  Cardiovascular: Normal rate, regular rhythm,  normal heart sounds and intact distal pulses.   Pulmonary/Chest: Effort normal. He has wheezes.  Abdominal: Soft. Bowel sounds are normal.  Musculoskeletal: Normal range of motion.  Neurological: He is alert and oriented to person, place, and time.  Skin: Skin is warm.  Psychiatric: He has a normal mood and affect. His behavior is normal. Judgment and thought content normal.  Nursing note and vitals reviewed.    ED Treatments / Results  Labs (all labs ordered are listed, but only abnormal results are displayed) Labs Reviewed  COMPREHENSIVE METABOLIC PANEL - Abnormal; Notable for the following:       Result Value   Glucose, Bld 102 (*)    Calcium 8.7 (*)    AST 50 (*)    All other components within normal limits  CBC - Abnormal;  Notable for the following:    RBC 4.09 (*)    HCT 37.3 (*)    All other components within normal limits  ETHANOL - Abnormal; Notable for the following:    Alcohol, Ethyl (B) 202 (*)    All other components within normal limits  LIPASE, BLOOD  URINALYSIS, ROUTINE W REFLEX MICROSCOPIC  RAPID URINE DRUG SCREEN, HOSP PERFORMED    EKG  EKG Interpretation None       Radiology Dg Chest 2 View  Result Date: 10/30/2016 CLINICAL DATA:  Productive cough EXAM: CHEST  2 VIEW COMPARISON:  07/12/2016 FINDINGS: Hyperinflated lungs. Tortuous ectatic thoracic aorta without aneurysm. Probable tiny pulmonary vessel seen on end versus granuloma at the right lung base. All Normal sized cardiac silhouette. No acute osseous abnormality. IMPRESSION: Hyperinflated lungs without acute pneumonic consolidation or effusion. Electronically Signed   By: Tollie Ethavid  Kwon M.D.   On: 10/30/2016 19:47    Procedures Procedures (including critical care time)  Medications Ordered in ED Medications  albuterol (PROVENTIL HFA;VENTOLIN HFA) 108 (90 Base) MCG/ACT inhaler 1-2 puff (not administered)  AEROCHAMBER PLUS FLO-VU MEDIUM MISC 1 each (not administered)  sodium chloride 0.9 % bolus 1,000 mL (0 mLs Intravenous Stopped 10/30/16 2002)  ipratropium-albuterol (DUONEB) 0.5-2.5 (3) MG/3ML nebulizer solution 3 mL (3 mLs Nebulization Given 10/30/16 1833)  ketorolac (TORADOL) 30 MG/ML injection 30 mg (30 mg Intravenous Given 10/30/16 1833)     Initial Impression / Assessment and Plan / ED Course  I have reviewed the triage vital signs and the nursing notes.  Pertinent labs & imaging results that were available during my care of the patient were reviewed by me and considered in my medical decision making (see chart for details).    Pt is feeling much better.  He is given an albuterol inhaler prior to d/c.  He is told to try to stop smoking and drinking.  He knows to return if worse.  Final Clinical Impressions(s) / ED  Diagnoses   Final diagnoses:  Acute bronchitis, unspecified organism  Diarrhea, unspecified type  Non-intractable vomiting with nausea, unspecified vomiting type  Tobacco abuse  Alcohol abuse    New Prescriptions New Prescriptions   PROMETHAZINE (PHENERGAN) 25 MG TABLET    Take 1 tablet (25 mg total) by mouth every 6 (six) hours as needed for nausea or vomiting.     Jacalyn LefevreJulie Shenell Rogalski, MD 10/30/16 2051

## 2016-10-30 NOTE — ED Notes (Signed)
Patient taken to Xray  

## 2016-10-30 NOTE — Discharge Instructions (Signed)
Try to stop smoking.  Try to stop drinking alcohol.

## 2016-10-30 NOTE — ED Triage Notes (Signed)
C/o productive cough with yellow phlegm, generalized body aches, nausea, diarrhea, decreased appetite, fever, and chills x 1 week.

## 2016-11-13 ENCOUNTER — Emergency Department (HOSPITAL_COMMUNITY): Payer: Self-pay

## 2016-11-13 ENCOUNTER — Emergency Department (HOSPITAL_COMMUNITY)
Admission: EM | Admit: 2016-11-13 | Discharge: 2016-11-13 | Disposition: A | Discharge status: Home / Self Care | Payer: Self-pay | Attending: Emergency Medicine | Admitting: Emergency Medicine

## 2016-11-13 ENCOUNTER — Encounter (HOSPITAL_COMMUNITY): Payer: Self-pay | Admitting: Emergency Medicine

## 2016-11-13 DIAGNOSIS — F17210 Nicotine dependence, cigarettes, uncomplicated: Secondary | ICD-10-CM | POA: Insufficient documentation

## 2016-11-13 DIAGNOSIS — R197 Diarrhea, unspecified: Secondary | ICD-10-CM | POA: Insufficient documentation

## 2016-11-13 DIAGNOSIS — R5383 Other fatigue: Secondary | ICD-10-CM | POA: Insufficient documentation

## 2016-11-13 DIAGNOSIS — Z79899 Other long term (current) drug therapy: Secondary | ICD-10-CM | POA: Insufficient documentation

## 2016-11-13 DIAGNOSIS — Z7982 Long term (current) use of aspirin: Secondary | ICD-10-CM | POA: Insufficient documentation

## 2016-11-13 LAB — COMPREHENSIVE METABOLIC PANEL WITH GFR
ALT: 29 U/L (ref 17–63)
AST: 35 U/L (ref 15–41)
Albumin: 3.8 g/dL (ref 3.5–5.0)
Alkaline Phosphatase: 49 U/L (ref 38–126)
Anion gap: 11 (ref 5–15)
BUN: 8 mg/dL (ref 6–20)
CO2: 21 mmol/L — ABNORMAL LOW (ref 22–32)
Calcium: 8.7 mg/dL — ABNORMAL LOW (ref 8.9–10.3)
Chloride: 106 mmol/L (ref 101–111)
Creatinine, Ser: 0.63 mg/dL (ref 0.61–1.24)
GFR calc Af Amer: >60 mL/min
GFR calc non Af Amer: >60 mL/min
Glucose, Bld: 87 mg/dL (ref 65–99)
Potassium: 3.7 mmol/L (ref 3.5–5.1)
Sodium: 138 mmol/L (ref 135–145)
Total Bilirubin: 0.5 mg/dL (ref 0.3–1.2)
Total Protein: 7.1 g/dL (ref 6.5–8.1)

## 2016-11-13 LAB — RAPID URINE DRUG SCREEN, HOSP PERFORMED
AMPHETAMINES: NOT DETECTED
BENZODIAZEPINES: NOT DETECTED
Barbiturates: NOT DETECTED
COCAINE: NOT DETECTED
OPIATES: NOT DETECTED
Tetrahydrocannabinol: NOT DETECTED

## 2016-11-13 LAB — URINALYSIS, ROUTINE W REFLEX MICROSCOPIC
BILIRUBIN URINE: NEGATIVE
Glucose, UA: NEGATIVE mg/dL
Hgb urine dipstick: NEGATIVE
Ketones, ur: NEGATIVE mg/dL
LEUKOCYTES UA: NEGATIVE
NITRITE: NEGATIVE
PH: 5 (ref 5.0–8.0)
Protein, ur: NEGATIVE mg/dL
SPECIFIC GRAVITY, URINE: 1.003 — AB (ref 1.005–1.030)

## 2016-11-13 LAB — CBC
HCT: 36.9 % — ABNORMAL LOW (ref 39.0–52.0)
Hemoglobin: 12.5 g/dL — ABNORMAL LOW (ref 13.0–17.0)
MCH: 31.6 pg (ref 26.0–34.0)
MCHC: 33.9 g/dL (ref 30.0–36.0)
MCV: 93.2 fL (ref 78.0–100.0)
Platelets: 178 10*3/uL (ref 150–400)
RBC: 3.96 MIL/uL — ABNORMAL LOW (ref 4.22–5.81)
RDW: 13.2 % (ref 11.5–15.5)
WBC: 5 10*3/uL (ref 4.0–10.5)

## 2016-11-13 LAB — I-STAT TROPONIN, ED: Troponin i, poc: 0 ng/mL (ref 0.00–0.08)

## 2016-11-13 LAB — LIPASE, BLOOD: Lipase: 19 U/L (ref 11–51)

## 2016-11-13 NOTE — ED Notes (Signed)
Pt understood dc material. NAD noted. Work note given

## 2016-11-13 NOTE — ED Triage Notes (Signed)
Pt. Stated, I've had diarrhea and vomiting for over a month

## 2016-11-13 NOTE — ED Notes (Signed)
Patient transported to X-ray 

## 2016-11-13 NOTE — ED Provider Notes (Signed)
[facility] Provider Note   CSN: [id number] Arrival date & time: 11/13/16  1324     History   Chief Complaint Chief Complaint  Patient presents with  . Diarrhea  . Emesis  . Nausea    HPI Samuel [name] is a 56 y.o. male.  Patient with past medical history remarkable for pancreatitis, alcohol abuse, presents to the emergency department with chief complaint of diarrhea. In triage, the patient also complained of nausea and vomiting 1 month, however he denies vomiting on my history. He states that he has had "plenty of diarrhea." He reports also feeling feverish and having chills. He states that he has had a cough, and reports some right side pain and some shortness of breath. He denies any other associated symptoms. He states that he has been drinking several cans of beer per day.   The history is provided by the patient. No language interpreter was used.    Past Medical History:  Diagnosis Date  . Pancreatitis     Patient Active Problem List   Diagnosis Date Noted  . Pancreatitis 05/01/2015  . HYPOKALEMIA 07/28/2010  . TOBACCO ABUSE 07/28/2010  . COCAINE ABUSE 07/28/2010  . NEUROPATHY 07/28/2010  . HYPONATREMIA 07/12/2010  . Alcohol abuse 07/12/2010  . Acute pancreatitis 07/12/2010    Past Surgical History:  Procedure Laterality Date  . NO PAST SURGERIES         Home Medications    Prior to Admission medications   Medication Sig Start Date End Date Taking? Authorizing Provider  aspirin 325 MG tablet Take 975 mg by mouth every 6 (six) hours as needed for mild pain or headache.    Historical Provider, MD  indomethacin (INDOCIN) 25 MG capsule Take 1 capsule (25 mg total) by mouth 3 (three) times daily as needed. 09/22/15   Antony MaduraKelly Humes, PA-C  ondansetron (ZOFRAN) 4 MG tablet Take 1 tablet (4 mg total) by mouth every 6 (six) hours as needed for nausea. Patient not taking: Reported on 09/22/2015 05/04/15   Dorothea OgleIskra M Myers, MD  promethazine (PHENERGAN) 25 MG  tablet Take 1 tablet (25 mg total) by mouth every 6 (six) hours as needed for nausea or vomiting. 10/30/16   Jacalyn LefevreJulie Haviland, MD  traMADol (ULTRAM) 50 MG tablet Take 1 tablet (50 mg total) by mouth every 6 (six) hours as needed. 09/22/15   Antony MaduraKelly Humes, PA-C    Family History Family History  Problem Relation Age of Onset  . Pancreatitis Neg Hx     Social History Social History  Substance Use Topics  . Smoking status: Current Every Day Smoker    Packs/day: 1.00    Types: Cigarettes  . Smokeless tobacco: Current User  . Alcohol use Yes     Allergies   Patient has no known allergies.   Review of Systems Review of Systems  Respiratory: Positive for cough and shortness of breath.   Gastrointestinal: Positive for diarrhea.  All other systems reviewed and are negative.    Physical Exam Updated Vital Signs BP 108/82 (BP Location: Right Arm)   Pulse 61   Temp 97.7 F (36.5 C) (Oral)   Resp 17   Ht 5\' 4"  (1.626 m)   Wt 60.8 kg   SpO2 100%   BMI 23.00 kg/m   Physical Exam  Constitutional: He is oriented to person, place, and time. He appears well-developed and well-nourished.  HENT:  Head: Normocephalic and atraumatic.  Eyes: Conjunctivae and EOM are normal. Pupils are equal, round, and  reactive to light. Right eye exhibits no discharge. Left eye exhibits no discharge. No scleral icterus.  Neck: Normal range of motion. Neck supple. No JVD present.  Cardiovascular: Normal rate, regular rhythm and normal heart sounds.  Exam reveals no gallop and no friction rub.   No murmur heard. Pulmonary/Chest: Effort normal and breath sounds normal. No respiratory distress. He has no wheezes. He has no rales. He exhibits no tenderness.  Abdominal: Soft. He exhibits no distension and no mass. There is no tenderness. There is no rebound and no guarding.  No focal abdominal tenderness, no RLQ tenderness or pain at McBurney's point, no RUQ tenderness or Murphy's sign, no left-sided abdominal  tenderness, no fluid wave, or signs of peritonitis   Musculoskeletal: Normal range of motion. He exhibits no edema or tenderness.  Neurological: He is alert and oriented to person, place, and time.  Skin: Skin is warm and dry.  Psychiatric: He has a normal mood and affect. His behavior is normal. Judgment and thought content normal.  Nursing note and vitals reviewed.    ED Treatments / Results  Labs (all labs ordered are listed, but only abnormal results are displayed) Labs Reviewed  COMPREHENSIVE METABOLIC PANEL - Abnormal; Notable for the following:       Result Value   CO2 21 (*)    Calcium 8.7 (*)    All other components within normal limits  CBC - Abnormal; Notable for the following:    RBC 3.96 (*)    Hemoglobin 12.5 (*)    HCT 36.9 (*)    All other components within normal limits  LIPASE, BLOOD  URINALYSIS, ROUTINE W REFLEX MICROSCOPIC  ETHANOL  ACETAMINOPHEN LEVEL  SALICYLATE LEVEL  RAPID URINE DRUG SCREEN, HOSP PERFORMED  I-STAT TROPOININ, ED    EKG  EKG Interpretation None       Radiology No results found.  Procedures Procedures (including critical care time)  Medications Ordered in ED Medications - No data to display   Initial Impression / Assessment and Plan / ED Course  I have reviewed the triage vital signs and the nursing notes.  Pertinent labs & imaging results that were available during my care of the patient were reviewed by me and considered in my medical decision making (see chart for details).     Patient with complaints of fatigue, and diarrhea 1 month. He initially told the triage nurse that he had some nausea and vomiting, but he denied any vomiting on my history. Vital signs are stable. The patient does not appear toxic. He does not have a primary care doctor, I have urged him that he needs to seek primary care follow-up. I have encouraged him to drink more fluids. His abdominal exam is benign, he has no focal tenderness. Laboratory  workup is reassuring.  Return precautions given.  Final Clinical Impressions(s) / ED Diagnoses   Final diagnoses:  Diarrhea, unspecified type  Fatigue, unspecified type    New Prescriptions New Prescriptions   No medications on file     Roxy Horseman, PA-C 11/13/16 2004    Tilden Fossa, MD 11/15/16 1044

## 2016-11-24 ENCOUNTER — Ambulatory Visit (HOSPITAL_COMMUNITY)
Admission: EM | Admit: 2016-11-24 | Discharge: 2016-11-24 | Disposition: A | Discharge status: Home / Self Care | Payer: Self-pay | Attending: Internal Medicine | Admitting: Internal Medicine

## 2016-11-24 DIAGNOSIS — Z720 Tobacco use: Secondary | ICD-10-CM

## 2016-11-24 DIAGNOSIS — B349 Viral infection, unspecified: Secondary | ICD-10-CM

## 2016-11-24 DIAGNOSIS — B9789 Other viral agents as the cause of diseases classified elsewhere: Secondary | ICD-10-CM

## 2016-11-24 DIAGNOSIS — J069 Acute upper respiratory infection, unspecified: Secondary | ICD-10-CM

## 2016-11-24 MED ORDER — ALBUTEROL SULFATE HFA 108 (90 BASE) MCG/ACT IN AERS: 2.0000 | INHALATION_SPRAY | RESPIRATORY_TRACT | 0 refills | Status: DC | PRN

## 2016-11-24 NOTE — Discharge Instructions (Signed)
Sudafed PE 10 mg every 4 to 6 hours as needed for congestion Allegra or Zyrtec daily as needed for drainage and runny nose. For stronger antihistamine may take Chlor-Trimeton 2 to 4 mg every 4 to 6 hours, may cause drowsiness. Saline nasal spray used frequently. Ibuprofen 600 mg every 6 hours as needed for pain, discomfort or fever. Drink plenty of fluids and stay well-hydrated. Stop smoking, No alcohol.

## 2016-11-24 NOTE — ED Provider Notes (Signed)
CSN: [id number]     Arrival date & time 11/24/16  1311 History   First MD Initiated Contact with Patient 11/24/16 1509     Chief Complaint  Patient presents with  . Influenza   (Consider location/radiation/quality/duration/timing/severity/associated sxs/prior Treatment) 56 year old male presents to the urgent care with complaints of no energy cough and occasional chest pain associated with cough. Also has a runny nose, sore throat, PND and chronic diarrhea. Denies fever or chills. He has been taking ibuprofen. Patient is well-known to the emergency department for frequent visits over the past 3 years for pancreatitis, alcohol abuse and intoxication, cocaine abuse, peripheral neuropathy with a history of smoking one pack per day, abdominal pain nausea and vomiting. Patient is requesting a note for being out of work this week and to be out for another couple of days.      Past Medical History:  Diagnosis Date  . Pancreatitis    Past Surgical History:  Procedure Laterality Date  . NO PAST SURGERIES     Family History  Problem Relation Age of Onset  . Pancreatitis Neg Hx    Social History  Substance Use Topics  . Smoking status: Current Every Day Smoker    Packs/day: 1.00    Types: Cigarettes  . Smokeless tobacco: Current User  . Alcohol use Yes    Review of Systems  Constitutional: Positive for activity change and fatigue. Negative for diaphoresis and fever.  HENT: Positive for congestion, postnasal drip and sore throat. Negative for ear pain, facial swelling, rhinorrhea and trouble swallowing.   Eyes: Negative for pain, discharge and redness.  Respiratory: Positive for cough. Negative for choking, chest tightness, shortness of breath and stridor.   Cardiovascular:       Anterior chest soreness and pain associated with coughing only.  Gastrointestinal: Positive for diarrhea. Negative for abdominal pain.  Genitourinary: Negative.   Musculoskeletal: Negative.  Negative for neck  pain and neck stiffness.  Skin: Negative.   Neurological: Negative.  Negative for facial asymmetry and speech difficulty.  All other systems reviewed and are negative.   Allergies  Patient has no known allergies.  Home Medications   Prior to Admission medications   Medication Sig Start Date End Date Taking? Authorizing Provider  aspirin 325 MG tablet Take 975 mg by mouth every 6 (six) hours as needed for mild pain or headache.   Yes Historical Provider, MD  promethazine (PHENERGAN) 25 MG tablet Take 1 tablet (25 mg total) by mouth every 6 (six) hours as needed for nausea or vomiting. 10/30/16  Yes Jacalyn LefevreJulie Haviland, MD  albuterol (PROVENTIL HFA;VENTOLIN HFA) 108 (90 Base) MCG/ACT inhaler Inhale 2 puffs into the lungs every 4 (four) hours as needed for wheezing or shortness of breath. 11/24/16   Hayden Rasmussenavid Blaine Hari, NP  indomethacin (INDOCIN) 25 MG capsule Take 1 capsule (25 mg total) by mouth 3 (three) times daily as needed. 09/22/15   Antony MaduraKelly Humes, PA-C   Meds Ordered and Administered this Visit  Medications - No data to display  BP 106/77 (BP Location: Right Arm)   Pulse 64   Temp 98.1 F (36.7 C) (Oral)   Resp 16   SpO2 100%  No data found.   Physical Exam  Constitutional: He is oriented to person, place, and time. He appears well-developed and well-nourished. No distress.  HENT:  Right Ear: External ear normal.  Left Ear: External ear normal.  Oropharynx is red with much cobblestoning and clear PND. No exudates or swelling. Lateral TMs are  mildly retracted otherwise normal.  Eyes: EOM are normal. Pupils are equal, round, and reactive to light.  Neck: Normal range of motion. Neck supple.  Cardiovascular: Normal rate, regular rhythm, normal heart sounds and intact distal pulses.   Pulmonary/Chest: Effort normal and breath sounds normal. No respiratory distress. He exhibits tenderness.  Lungs are clear. Fair expansion and air movement. Minimal coarseness with expiration only. No wheezes or  rales are heard.  Abdominal: Soft. There is no tenderness.  Musculoskeletal: Normal range of motion. He exhibits no edema.  Lymphadenopathy:    He has no cervical adenopathy.  Neurological: He is alert and oriented to person, place, and time.  Skin: Skin is warm and dry.  Psychiatric: He has a normal mood and affect.  Nursing note and vitals reviewed.   Urgent Care Course     Procedures (including critical care time)  Labs Review Labs Reviewed - No data to display  Imaging Review No results found.   Visual Acuity Review  Right Eye Distance:   Left Eye Distance:   Bilateral Distance:    Right Eye Near:   Left Eye Near:    Bilateral Near:         MDM   1. Viral syndrome   2. Viral upper respiratory tract infection   3. Tobacco abuse    Sudafed PE 10 mg every 4 to 6 hours as needed for congestion Allegra or Zyrtec daily as needed for drainage and runny nose. For stronger antihistamine may take Chlor-Trimeton 2 to 4 mg every 4 to 6 hours, may cause drowsiness. Saline nasal spray used frequently. Ibuprofen 600 mg every 6 hours as needed for pain, discomfort or fever. Drink plenty of fluids and stay well-hydrated. Stop smoking, No alcohol. Meds ordered this encounter  Medications  . albuterol (PROVENTIL HFA;VENTOLIN HFA) 108 (90 Base) MCG/ACT inhaler    Sig: Inhale 2 puffs into the lungs every 4 (four) hours as needed for wheezing or shortness of breath.    Dispense:  1 Inhaler    Refill:  0    Order Specific Question:   Supervising Provider    Answer:   Eustace Moore [[id number]]       Hayden Rasmussen, NP 11/24/16 [phone]

## 2016-11-24 NOTE — ED Triage Notes (Signed)
C/o flu like sx for three weeks  States he is fatigue, diarrhea, coughing up brown greenish mucous and body ache Ibuprofen taking as tx

## 2017-03-17 ENCOUNTER — Encounter (HOSPITAL_COMMUNITY): Payer: Self-pay | Admitting: Neurology

## 2017-03-17 ENCOUNTER — Emergency Department (HOSPITAL_COMMUNITY)
Admission: EM | Admit: 2017-03-17 | Discharge: 2017-03-17 | Disposition: A | Discharge status: Home / Self Care | Payer: Self-pay | Attending: Emergency Medicine | Admitting: Emergency Medicine

## 2017-03-17 DIAGNOSIS — R112 Nausea with vomiting, unspecified: Secondary | ICD-10-CM | POA: Insufficient documentation

## 2017-03-17 DIAGNOSIS — Z7982 Long term (current) use of aspirin: Secondary | ICD-10-CM | POA: Insufficient documentation

## 2017-03-17 DIAGNOSIS — R109 Unspecified abdominal pain: Secondary | ICD-10-CM | POA: Insufficient documentation

## 2017-03-17 DIAGNOSIS — R630 Anorexia: Secondary | ICD-10-CM | POA: Insufficient documentation

## 2017-03-17 DIAGNOSIS — Z79899 Other long term (current) drug therapy: Secondary | ICD-10-CM | POA: Insufficient documentation

## 2017-03-17 DIAGNOSIS — R197 Diarrhea, unspecified: Secondary | ICD-10-CM | POA: Insufficient documentation

## 2017-03-17 DIAGNOSIS — F17210 Nicotine dependence, cigarettes, uncomplicated: Secondary | ICD-10-CM | POA: Insufficient documentation

## 2017-03-17 LAB — CBC
HCT: 44.9 % (ref 39.0–52.0)
Hemoglobin: 16 g/dL (ref 13.0–17.0)
MCH: 33.1 pg (ref 26.0–34.0)
MCHC: 35.6 g/dL (ref 30.0–36.0)
MCV: 92.8 fL (ref 78.0–100.0)
PLATELETS: 180 10*3/uL (ref 150–400)
RBC: 4.84 MIL/uL (ref 4.22–5.81)
RDW: 12.4 % (ref 11.5–15.5)
WBC: 4.7 10*3/uL (ref 4.0–10.5)

## 2017-03-17 LAB — COMPREHENSIVE METABOLIC PANEL
ALT: 55 U/L (ref 17–63)
ANION GAP: 11 (ref 5–15)
AST: 150 U/L — ABNORMAL HIGH (ref 15–41)
Albumin: 4 g/dL (ref 3.5–5.0)
Alkaline Phosphatase: 68 U/L (ref 38–126)
BUN: 13 mg/dL (ref 6–20)
CALCIUM: 9 mg/dL (ref 8.9–10.3)
CO2: 24 mmol/L (ref 22–32)
CREATININE: 0.8 mg/dL (ref 0.61–1.24)
Chloride: 101 mmol/L (ref 101–111)
Glucose, Bld: 92 mg/dL (ref 65–99)
Potassium: 4.2 mmol/L (ref 3.5–5.1)
Sodium: 136 mmol/L (ref 135–145)
Total Bilirubin: 1.2 mg/dL (ref 0.3–1.2)
Total Protein: 8 g/dL (ref 6.5–8.1)

## 2017-03-17 LAB — LIPASE, BLOOD: LIPASE: 17 U/L (ref 11–51)

## 2017-03-17 MED ORDER — ONDANSETRON HCL 4 MG/2ML IJ SOLN
4.0000 mg | Freq: Once | INTRAMUSCULAR | Status: AC
  Administered 2017-03-17: 4 mg via INTRAVENOUS
  Filled 2017-03-17: qty 2

## 2017-03-17 MED ORDER — DIPHENOXYLATE-ATROPINE 2.5-0.025 MG PO TABS: 1.0000 | ORAL_TABLET | Freq: Four times a day (QID) | ORAL | 0 refills | Status: DC | PRN

## 2017-03-17 MED ORDER — SODIUM CHLORIDE 0.9 % IV BOLUS (SEPSIS)
1000.0000 mL | Freq: Once | INTRAVENOUS | Status: AC
  Administered 2017-03-17: 1000 mL via INTRAVENOUS

## 2017-03-17 MED ORDER — ONDANSETRON 4 MG PO TBDP: 4.0000 mg | ORAL_TABLET | Freq: Three times a day (TID) | ORAL | 0 refills | Status: DC | PRN

## 2017-03-17 NOTE — ED Triage Notes (Signed)
Pt reports since Sunday, n/v/d, muscle cramps, spasms all over body. He thinks something might have bitten him on his right lower leg?

## 2017-03-17 NOTE — ED Notes (Signed)
Care handoff to Jamie, RN. 

## 2017-03-17 NOTE — Discharge Instructions (Signed)
Take Zofran for nausea Take Lomotil for diarrhea Follow up with primary care and GI

## 2017-03-17 NOTE — ED Provider Notes (Signed)
[facility]-EMERGENCY DEPT Provider Note   CSN: [id number] Arrival date & time: 03/17/17  7829     History   Chief Complaint Chief Complaint  Patient presents with  . Emesis  . Diarrhea  . Spasms    HPI Samuel [name] is a 56 y.o. male who presents with diffuse body cramping and spasms, nausea, vomiting, diarrhea. Past medical history significant for pancreatitis and alcohol abuse, and chronic diarrhea. He states that he has had issues with diarrhea for several years. It usually occurs right after he eats he'll need to go to the bathroom several times a day. About 5 days ago his diarrhea has worsened and occurs several times a day no matter whether he eats or not. It is sometimes watery sometimes loose. No blood in or mucus in the stool. He has tried Pepto-Bismol and Imodium with no relief. He denies any fever, chills, chest pain or shortness of breath. He has diffuse abdominal cramping and nausea with several episodes of vomiting as well. He endorses daily alcohol use - mostly beer (he tells me half a six pack daily). He denies prior abdominal surgeries or history of colonoscopy. No recent antibiotics or travel. He also notes that he's noticed several bites on his legs. They're not painful or itchy but wonders if this is related. He does not have a PCP.  HPI  Past Medical History:  Diagnosis Date  . Pancreatitis     Patient Active Problem List   Diagnosis Date Noted  . HYPOKALEMIA 07/28/2010  . TOBACCO ABUSE 07/28/2010  . COCAINE ABUSE 07/28/2010  . NEUROPATHY 07/28/2010  . HYPONATREMIA 07/12/2010  . Alcohol abuse 07/12/2010  . Acute pancreatitis 07/12/2010    Past Surgical History:  Procedure Laterality Date  . NO PAST SURGERIES         Home Medications    Prior to Admission medications   Medication Sig Start Date End Date Taking? Authorizing Provider  albuterol (PROVENTIL HFA;VENTOLIN HFA) 108 (90 Base) MCG/ACT inhaler Inhale 2 puffs into the lungs every 4 (four) hours  as needed for wheezing or shortness of breath. 11/24/16   Hayden Rasmussen, NP  aspirin 325 MG tablet Take 975 mg by mouth every 6 (six) hours as needed for mild pain or headache.    [provider]  indomethacin (INDOCIN) 25 MG capsule Take 1 capsule (25 mg total) by mouth 3 (three) times daily as needed. 09/22/15   Antony Madura, PA-C  promethazine (PHENERGAN) 25 MG tablet Take 1 tablet (25 mg total) by mouth every 6 (six) hours as needed for nausea or vomiting. 10/30/16   Jacalyn Lefevre, MD    Family History Family History  Problem Relation Age of Onset  . Pancreatitis Neg Hx     Social History Social History  Substance Use Topics  . Smoking status: Current Every Day Smoker    Packs/day: 1.00    Types: Cigarettes  . Smokeless tobacco: Current User  . Alcohol use Yes     Allergies   Patient has no known allergies.   Review of Systems Review of Systems  Constitutional: Positive for appetite change. Negative for chills and fever.  Respiratory: Negative for shortness of breath.   Cardiovascular: Negative for chest pain.  Gastrointestinal: Positive for abdominal pain, diarrhea, nausea and vomiting. Negative for blood in stool.  Musculoskeletal: Positive for myalgias (+cramping and spasms).  Skin:       +bite marks  All other systems reviewed and are negative.    Physical Exam Updated  Vital Signs BP (!) 143/93 (BP Location: Left Arm)   Pulse 83   Temp 97.8 F (36.6 C) (Oral)   Resp 16   Ht 5\' 5"  (1.651 m)   Wt 61.2 kg (135 lb)   SpO2 99%   BMI 22.47 kg/m   Physical Exam  Constitutional: He is oriented to person, place, and time. He appears well-developed and well-nourished. No distress.  Thin, calm, cooperative  HENT:  Head: Normocephalic and atraumatic.  Eyes: Conjunctivae are normal. Pupils are equal, round, and reactive to light. Right eye exhibits no discharge. Left eye exhibits no discharge. No scleral icterus.  Neck: Normal range of motion.    Cardiovascular: Normal rate and regular rhythm.  Exam reveals no gallop and no friction rub.   No murmur heard. Pulmonary/Chest: Effort normal and breath sounds normal. No respiratory distress. He has no wheezes. He has no rales. He exhibits no tenderness.  Abdominal: Soft. Bowel sounds are normal. He exhibits no distension and no mass. There is tenderness (diffuse). There is guarding (voluntary). There is no rebound. No hernia.  Neurological: He is alert and oriented to person, place, and time.  Skin: Skin is warm and dry.  Psychiatric: He has a normal mood and affect. His behavior is normal.  Nursing note and vitals reviewed.    ED Treatments / Results  Labs (all labs ordered are listed, but only abnormal results are displayed) Labs Reviewed  COMPREHENSIVE METABOLIC PANEL - Abnormal; Notable for the following:       Result Value   AST 150 (*)    All other components within normal limits  LIPASE, BLOOD  CBC  URINALYSIS, ROUTINE W REFLEX MICROSCOPIC    EKG  EKG Interpretation None       Radiology No results found.  Procedures Procedures (including critical care time)  Medications Ordered in ED Medications  ondansetron (ZOFRAN) injection 4 mg (4 mg Intravenous Given 03/17/17 0943)  sodium chloride 0.9 % bolus 1,000 mL (0 mLs Intravenous Stopped 03/17/17 1027)     Initial Impression / Assessment and Plan / ED Course  I have reviewed the triage vital signs and the nursing notes.  Pertinent labs & imaging results that were available during my care of the patient were reviewed by me and considered in my medical decision making (see chart for details).  56 year old with diffuse abdominal cramping, N/V/D. Vitals are normal. Abdomen is non-tender with voluntary guarding. Will give fluids and nausea medicine and reassess.  CBC unremarkable. CMP remarkable for elevated AST - likely from ETOH use. Discussed cessation of alcohol. Lipase is normal. He tolerated PO and feels mildly  better on reexamination. We talked about establishing care with a PCP to further evaluate his chronic diarrhea and setting up appt with GI for a colonoscopy. Rx for Zofran and Lomotil given. He verbalized understanding. Return precautions given.  Final Clinical Impressions(s) / ED Diagnoses   Final diagnoses:  Nausea vomiting and diarrhea    New Prescriptions New Prescriptions   DIPHENOXYLATE-ATROPINE (LOMOTIL) 2.5-0.025 MG TABLET    Take 1 tablet by mouth 4 (four) times daily as needed for diarrhea or loose stools.   ONDANSETRON (ZOFRAN ODT) 4 MG DISINTEGRATING TABLET    Take 1 tablet (4 mg total) by mouth every 8 (eight) hours as needed for nausea or vomiting.     Bethel BornGekas, Krysteena Stalker Marie, PA-C 03/17/17 1114    Linwood DibblesKnapp, Jon, MD 03/18/17 [phone]

## 2017-03-17 NOTE — ED Notes (Signed)
Pa  at bedside. 

## 2017-05-28 ENCOUNTER — Encounter (HOSPITAL_COMMUNITY): Payer: Self-pay | Admitting: Emergency Medicine

## 2017-05-28 ENCOUNTER — Emergency Department (HOSPITAL_COMMUNITY): Payer: Self-pay

## 2017-05-28 ENCOUNTER — Emergency Department (HOSPITAL_COMMUNITY)
Admission: EM | Admit: 2017-05-28 | Discharge: 2017-05-28 | Disposition: A | Discharge status: Home / Self Care | Payer: Self-pay | Attending: Emergency Medicine | Admitting: Emergency Medicine

## 2017-05-28 DIAGNOSIS — M79641 Pain in right hand: Secondary | ICD-10-CM | POA: Insufficient documentation

## 2017-05-28 DIAGNOSIS — F17210 Nicotine dependence, cigarettes, uncomplicated: Secondary | ICD-10-CM | POA: Insufficient documentation

## 2017-05-28 NOTE — ED Provider Notes (Signed)
[facility] Provider Note   CSN: [id number] Arrival date & time: 05/28/17  4098     History   Chief Complaint Chief Complaint  Patient presents with  . Arm Swelling    HPI Samuel [name] is a 56 y.o. 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 Patient believes that he may have gotten bitten by something. He reports diffuse swelling and tenderness. He reports similar episode in the past and was told that he had arthritis. He has been taking ibuprofen with mild relief. Denies any numbness, fever, chills or other symptoms.    HPI  Past Medical History:  Diagnosis Date  . Pancreatitis     Patient Active Problem List   Diagnosis Date Noted  . HYPOKALEMIA 07/28/2010  . TOBACCO ABUSE 07/28/2010  . COCAINE ABUSE 07/28/2010  . NEUROPATHY 07/28/2010  . HYPONATREMIA 07/12/2010  . Alcohol abuse 07/12/2010  . Acute pancreatitis 07/12/2010    Past Surgical History:  Procedure Laterality Date  . NO PAST SURGERIES         Home Medications    Prior to Admission medications   Medication Sig Start Date End Date Taking? Authorizing Provider  albuterol (PROVENTIL HFA;VENTOLIN HFA) 108 (90 Base) MCG/ACT inhaler Inhale 2 puffs into the lungs every 4 (four) hours as needed for wheezing or shortness of breath. 11/24/16   Hayden Rasmussen, NP  aspirin 325 MG tablet Take 975 mg by mouth every 6 (six) hours as needed for mild pain or headache.    [provider]  diphenoxylate-atropine (LOMOTIL) 2.5-0.025 MG tablet Take 1 tablet by mouth 4 (four) times daily as needed for diarrhea or loose stools. 03/17/17   Bethel Born, PA-C  ibuprofen (ADVIL,MOTRIN) 200 MG tablet Take 200 mg by mouth every 6 (six) hours as needed for mild pain.    [provider]  indomethacin (INDOCIN) 25 MG capsule Take 1 capsule (25 mg total) by mouth 3 (three) times daily as needed. Patient not taking: Reported on 03/17/2017 09/22/15    Antony Madura, PA-C  ondansetron (ZOFRAN ODT) 4 MG disintegrating tablet Take 1 tablet (4 mg total) by mouth every 8 (eight) hours as needed for nausea or vomiting. 03/17/17   Bethel Born, PA-C  promethazine (PHENERGAN) 25 MG tablet Take 1 tablet (25 mg total) by mouth every 6 (six) hours as needed for nausea or vomiting. Patient not taking: Reported on 03/17/2017 10/30/16   Jacalyn Lefevre, MD    Family History Family History  Problem Relation Age of Onset  . Pancreatitis Neg Hx     Social History Social History  Substance Use Topics  . Smoking status: Current Every Day Smoker    Packs/day: 1.00    Types: Cigarettes  . Smokeless tobacco: Current User  . Alcohol use Yes     Allergies   Patient has no known allergies.   Review of Systems Review of Systems  Constitutional: Negative for chills and fever.  Respiratory: Negative for cough, shortness of breath, wheezing and stridor.   Cardiovascular: Negative for chest pain and palpitations.  Gastrointestinal: Negative for abdominal pain, nausea and vomiting.  Musculoskeletal: Positive for arthralgias, joint swelling and myalgias. Negative for back pain, neck pain and neck stiffness.  Skin: Negative for color change, pallor, rash and wound.  Neurological: Negative for seizures, syncope, weakness and numbness.     Physical Exam Updated Vital Signs BP 109/80 (BP Location: Right Arm)   Pulse 62  Temp 97.7 F (36.5 C) (Oral)   Resp 18   Ht 5\' 4"  (1.626 m)   Wt 62.6 kg (138 lb)   SpO2 99%   BMI 23.69 kg/m   Physical Exam  Constitutional: He appears well-developed and well-nourished. No distress.  Afebrile, nontoxic-appearing, lying comfortably in bed in no acute distress.  HENT:  Head: Normocephalic and atraumatic.  Eyes: Conjunctivae are normal.  Neck: Normal range of motion.  Cardiovascular: Normal rate, regular rhythm and normal heart sounds.   No murmur heard. Pulmonary/Chest: Effort normal and breath sounds  normal. No respiratory distress. He has no wheezes. He has no rales.  Abdominal: He exhibits no distension.  Musculoskeletal: Normal range of motion. He exhibits edema and tenderness. He exhibits no deformity.  Mild diffuse edema the right hand. Diffuse tenderness to the palm of the hand and dorsum over the first and second metacarpals. No single whole digit swelling or erythema tracking  Neurological: He is alert. No sensory deficit.  Skin: Skin is warm and dry. Capillary refill takes less than 2 seconds. No rash noted. He is not diaphoretic. No erythema. No pallor.  Psychiatric: He has a normal mood and affect.  Nursing note and vitals reviewed.    ED Treatments / Results  Labs (all labs ordered are listed, but only abnormal results are displayed) Labs Reviewed - No data to display  EKG  EKG Interpretation None       Radiology Dg Hand Complete Right  Result Date: 05/28/2017 CLINICAL DATA:  Right hand pain and swelling. EXAM: RIGHT HAND - COMPLETE 3+ VIEW COMPARISON:  09/22/2015 FINDINGS: There is no evidence of fracture or dislocation. There is no evidence of arthropathy or other focal bone abnormality. Soft tissues are unremarkable. IMPRESSION: No acute osseous injury of the right hand. Electronically Signed   By: Elige Ko   On: 05/28/2017 12:00    Procedures Procedures (including critical care time)  Medications Ordered in ED Medications - No data to display   Initial Impression / Assessment and Plan / ED Course  I have reviewed the triage vital signs and the nursing notes.  Pertinent labs & imaging results that were available during my care of the patient were reviewed by me and considered in my medical decision making (see chart for details).    Patient presenting with vague complaints and not cooperative with history gathering.   Inconsistent exam findings. He reports hx of similar symptoms and arthritis of the right hand. No evidence of cellulitis or  tenosynovitis. No systemic symptoms. She is otherwise well-appearing with normal vital signs and stable. Xray: negative  Will discharge home with rice protocol and over-the-counter anti-inflammatories. Advised follow up with a primary care provider and provided resources.  Discussed strict return precautions and advised to return to the emergency department if experiencing any new or worsening symptoms. Instructions were understood and patient agreed with discharge plan. Final Clinical Impressions(s) / ED Diagnoses   Final diagnoses:  Hand pain, right    New Prescriptions New Prescriptions   No medications on file     Gregary Cromer 05/28/17 1235    Arby Barrette, MD 06/06/17 1740

## 2017-05-28 NOTE — ED Triage Notes (Signed)
Pt states x2 days c/o R hand swelling. No redness noted, R hand is visibly more swollen than left. Pt states "i think something bit". Sensation intact, cap refill <3 seconds.

## 2017-05-28 NOTE — ED Notes (Signed)
Declined W/C at D/C and was escorted to lobby by RN. 

## 2017-05-28 NOTE — Discharge Instructions (Signed)
As discussed, apply ice to the area and ibuprofen, tylenol or naproxen for pain. Follow up with the wellness center as soon as possible to establish primary care and follow up.  Return if swelling returns, redness, warmth, fever, chills or other concerning symptoms in the meantime.

## 2017-11-21 ENCOUNTER — Emergency Department (HOSPITAL_COMMUNITY): Payer: Self-pay

## 2017-11-21 ENCOUNTER — Encounter (HOSPITAL_COMMUNITY): Payer: Self-pay | Admitting: *Deleted

## 2017-11-21 ENCOUNTER — Emergency Department (HOSPITAL_COMMUNITY)
Admission: EM | Admit: 2017-11-21 | Discharge: 2017-11-21 | Disposition: A | Discharge status: Home / Self Care | Payer: Self-pay | Attending: Emergency Medicine | Admitting: Emergency Medicine

## 2017-11-21 DIAGNOSIS — Y998 Other external cause status: Secondary | ICD-10-CM | POA: Insufficient documentation

## 2017-11-21 DIAGNOSIS — Y939 Activity, unspecified: Secondary | ICD-10-CM | POA: Insufficient documentation

## 2017-11-21 DIAGNOSIS — W19XXXA Unspecified fall, initial encounter: Secondary | ICD-10-CM | POA: Insufficient documentation

## 2017-11-21 DIAGNOSIS — Y929 Unspecified place or not applicable: Secondary | ICD-10-CM | POA: Insufficient documentation

## 2017-11-21 DIAGNOSIS — F17210 Nicotine dependence, cigarettes, uncomplicated: Secondary | ICD-10-CM | POA: Insufficient documentation

## 2017-11-21 DIAGNOSIS — S20212A Contusion of left front wall of thorax, initial encounter: Secondary | ICD-10-CM | POA: Insufficient documentation

## 2017-11-21 MED ORDER — OXYCODONE-ACETAMINOPHEN 5-325 MG PO TABS: 1.0000 | ORAL_TABLET | Freq: Four times a day (QID) | ORAL | 0 refills | Status: DC | PRN

## 2017-11-21 NOTE — ED Provider Notes (Signed)
[facility] Provider Note   CSN: [id number] Arrival date & time: 11/21/17  0840     History   Chief Complaint Chief Complaint  Patient presents with  . Fall    HPI Samuel [name] is a 57 y.o. male.  HPI Patient presents with left-sided chest pain.  States he fell Sunday night with today being Tuesday.  Has pain in his left chest since then.  States he broke some ribs.  Worse with movement and worse with deep breaths.  No cough.  No hemoptysis.  No fevers.  He smokes about 10 cigarettes a day.  States he is never been diagnosed with lung problems.  No fevers.  No hemoptysis. Past Medical History:  Diagnosis Date  . Pancreatitis     Patient Active Problem List   Diagnosis Date Noted  . HYPOKALEMIA 07/28/2010  . TOBACCO ABUSE 07/28/2010  . COCAINE ABUSE 07/28/2010  . NEUROPATHY 07/28/2010  . HYPONATREMIA 07/12/2010  . Alcohol abuse 07/12/2010  . Acute pancreatitis 07/12/2010    Past Surgical History:  Procedure Laterality Date  . NO PAST SURGERIES         Home Medications    Prior to Admission medications   Medication Sig Start Date End Date Taking? Authorizing Provider  albuterol (PROVENTIL HFA;VENTOLIN HFA) 108 (90 Base) MCG/ACT inhaler Inhale 2 puffs into the lungs every 4 (four) hours as needed for wheezing or shortness of breath. 11/24/16   Hayden RasmussenMabe, David, NP  aspirin 325 MG tablet Take 975 mg by mouth every 6 (six) hours as needed for mild pain or headache.    [provider]  diphenoxylate-atropine (LOMOTIL) 2.5-0.025 MG tablet Take 1 tablet by mouth 4 (four) times daily as needed for diarrhea or loose stools. 03/17/17   Bethel BornGekas, Kelly Marie, PA-C  ibuprofen (ADVIL,MOTRIN) 200 MG tablet Take 200 mg by mouth every 6 (six) hours as needed for mild pain.    [provider]  indomethacin (INDOCIN) 25 MG capsule Take 1 capsule (25 mg total) by mouth 3 (three) times daily as needed. Patient not taking: Reported on  03/17/2017 09/22/15   Antony MaduraHumes, Kelly, PA-C  ondansetron (ZOFRAN ODT) 4 MG disintegrating tablet Take 1 tablet (4 mg total) by mouth every 8 (eight) hours as needed for nausea or vomiting. 03/17/17   Bethel BornGekas, Kelly Marie, PA-C  oxyCODONE-acetaminophen (PERCOCET/ROXICET) 5-325 MG tablet Take 1-2 tablets by mouth every 6 (six) hours as needed for severe pain. 11/21/17   Benjiman CorePickering, Cerra Eisenhower, MD  promethazine (PHENERGAN) 25 MG tablet Take 1 tablet (25 mg total) by mouth every 6 (six) hours as needed for nausea or vomiting. Patient not taking: Reported on 03/17/2017 10/30/16   Jacalyn LefevreHaviland, Julie, MD    Family History Family History  Problem Relation Age of Onset  . Pancreatitis Neg Hx     Social History Social History   Tobacco Use  . Smoking status: Current Every Day Smoker    Packs/day: 1.00    Types: Cigarettes  . Smokeless tobacco: Current User  Substance Use Topics  . Alcohol use: Yes  . Drug use: No     Allergies   Patient has no known allergies.   Review of Systems Review of Systems  Constitutional: Negative for appetite change.  HENT: Negative for congestion.   Respiratory: Positive for shortness of breath.   Cardiovascular: Positive for chest pain.  Gastrointestinal: Negative for abdominal pain and anal bleeding.  Genitourinary: Negative for flank pain.  Musculoskeletal: Negative for back pain.  Neurological: Negative for weakness.  Psychiatric/Behavioral: Negative for confusion.     Physical Exam Updated Vital Signs BP 123/78 (BP Location: Right Arm)   Pulse (!) 57   Temp 98.1 F (36.7 C) (Oral)   Resp 18   SpO2 100%   Physical Exam  Constitutional: He appears well-developed.  HENT:  Head: Normocephalic.  Neck: Neck supple.  Cardiovascular: Normal rate.  Pulmonary/Chest: Effort normal. He has no wheezes. He exhibits tenderness.  No tenderness to left lateral chest wall.  No subcu emphysema.  No crepitance but does have point tenderness over several lateral mid ribs.      Abdominal: Soft.  Musculoskeletal: He exhibits no edema.  Neurological: He is alert.  Skin: Skin is warm. Capillary refill takes less than 2 seconds.     ED Treatments / Results  Labs (all labs ordered are listed, but only abnormal results are displayed) Labs Reviewed - No data to display  EKG  EKG Interpretation None       Radiology Dg Ribs Unilateral W/chest Left  Result Date: 11/21/2017 CLINICAL DATA:  Pain following fall EXAM: LEFT RIBS AND CHEST - 3+ VIEW COMPARISON:  Chest radiograph November 13, 2016 FINDINGS: Frontal chest as well as oblique and cone-down rib images were obtained. There is no edema or consolidation. Heart size and pulmonary vascularity are normal. No adenopathy. No pneumothorax or pleural effusion evident. No evident rib fracture. IMPRESSION: No rib fracture.  No edema or consolidation. Electronically Signed   By: Bretta Bang III M.D.   On: 11/21/2017 09:36    Procedures Procedures (including critical care time)  Medications Ordered in ED Medications - No data to display   Initial Impression / Assessment and Plan / ED Course  I have reviewed the triage vital signs and the nursing notes.  Pertinent labs & imaging results that were available during my care of the patient were reviewed by me and considered in my medical decision making (see chart for details).    Patient with left-sided chest pain.  She likely rib fractures but none seen on x-ray.  No pneumonia or pneumothorax no respiratory distress.  Will discharge home with pain medicine and inspiratory spirometer. Final Clinical Impressions(s) / ED Diagnoses   Final diagnoses:  Chest wall contusion, left, initial encounter    ED Discharge Orders        Ordered    oxyCODONE-acetaminophen (PERCOCET/ROXICET) 5-325 MG tablet  Every 6 hours PRN     11/21/17 1038       Benjiman Core, MD 11/21/17 1042

## 2017-11-21 NOTE — ED Triage Notes (Signed)
Pt reprots a fall on Sunday night. Pt states that he has left rib pain since. Pt states that he has some difficulty breathing. Bilateral lung sounds present

## 2018-05-12 ENCOUNTER — Other Ambulatory Visit: Payer: Self-pay

## 2018-05-12 ENCOUNTER — Encounter (HOSPITAL_COMMUNITY): Payer: Self-pay | Admitting: Emergency Medicine

## 2018-05-12 ENCOUNTER — Emergency Department (HOSPITAL_COMMUNITY): Payer: Self-pay

## 2018-05-12 ENCOUNTER — Emergency Department (HOSPITAL_COMMUNITY)
Admission: EM | Admit: 2018-05-12 | Discharge: 2018-05-13 | Disposition: A | Discharge status: Home / Self Care | Payer: Self-pay | Attending: Emergency Medicine | Admitting: Emergency Medicine

## 2018-05-12 DIAGNOSIS — Z7982 Long term (current) use of aspirin: Secondary | ICD-10-CM | POA: Insufficient documentation

## 2018-05-12 DIAGNOSIS — Z79899 Other long term (current) drug therapy: Secondary | ICD-10-CM | POA: Insufficient documentation

## 2018-05-12 DIAGNOSIS — B349 Viral infection, unspecified: Secondary | ICD-10-CM | POA: Insufficient documentation

## 2018-05-12 DIAGNOSIS — M79672 Pain in left foot: Secondary | ICD-10-CM | POA: Insufficient documentation

## 2018-05-12 DIAGNOSIS — M79675 Pain in left toe(s): Secondary | ICD-10-CM

## 2018-05-12 DIAGNOSIS — F17210 Nicotine dependence, cigarettes, uncomplicated: Secondary | ICD-10-CM | POA: Insufficient documentation

## 2018-05-12 LAB — CBC WITH DIFFERENTIAL/PLATELET
Abs Immature Granulocytes: 0 10*3/uL (ref 0.0–0.1)
Basophils Absolute: 0.1 10*3/uL (ref 0.0–0.1)
Basophils Relative: 1 %
EOS ABS: 0.1 10*3/uL (ref 0.0–0.7)
EOS PCT: 1 %
HCT: 42.9 % (ref 39.0–52.0)
Hemoglobin: 14.5 g/dL (ref 13.0–17.0)
Immature Granulocytes: 0 %
Lymphocytes Relative: 34 %
Lymphs Abs: 2.6 10*3/uL (ref 0.7–4.0)
MCH: 33.2 pg (ref 26.0–34.0)
MCHC: 33.8 g/dL (ref 30.0–36.0)
MCV: 98.2 fL (ref 78.0–100.0)
MONOS PCT: 8 %
Monocytes Absolute: 0.6 10*3/uL (ref 0.1–1.0)
Neutro Abs: 4.3 10*3/uL (ref 1.7–7.7)
Neutrophils Relative %: 56 %
Platelets: 192 10*3/uL (ref 150–400)
RBC: 4.37 MIL/uL (ref 4.22–5.81)
RDW: 12.6 % (ref 11.5–15.5)
WBC: 7.7 10*3/uL (ref 4.0–10.5)

## 2018-05-12 LAB — URINALYSIS, ROUTINE W REFLEX MICROSCOPIC
Bilirubin Urine: NEGATIVE
Glucose, UA: NEGATIVE mg/dL
Hgb urine dipstick: NEGATIVE
Ketones, ur: NEGATIVE mg/dL
LEUKOCYTES UA: NEGATIVE
Nitrite: NEGATIVE
Protein, ur: NEGATIVE mg/dL
Specific Gravity, Urine: 1.006 (ref 1.005–1.030)
pH: 5 (ref 5.0–8.0)

## 2018-05-12 LAB — BASIC METABOLIC PANEL
Anion gap: 11 (ref 5–15)
BUN: 8 mg/dL (ref 6–20)
CO2: 26 mmol/L (ref 22–32)
Calcium: 9.2 mg/dL (ref 8.9–10.3)
Chloride: 103 mmol/L (ref 98–111)
Creatinine, Ser: 0.7 mg/dL (ref 0.61–1.24)
GFR calc Af Amer: >60 mL/min (ref >60)
GFR calc non Af Amer: >60 mL/min (ref >60)
Glucose, Bld: 110 mg/dL — ABNORMAL HIGH (ref 70–99)
Potassium: 3.8 mmol/L (ref 3.5–5.1)
Sodium: 140 mmol/L (ref 135–145)

## 2018-05-12 NOTE — ED Triage Notes (Signed)
Pt states he had a "black widow" in his shoe last week that bit his left second toe. Toenail is black.  Pt has had fatigue, diarrhea, and general feeling unwell since Monday.  Has not been able to work.

## 2018-05-12 NOTE — ED Notes (Signed)
Patient transported to X-ray 

## 2018-05-12 NOTE — ED Notes (Signed)
ED Provider at bedside. 

## 2018-05-13 NOTE — ED Provider Notes (Signed)
[facility] EMERGENCY DEPARTMENT Provider Note  CSN: [id number] Arrival date & time: 05/12/18  1924    History   Chief Complaint Chief Complaint  Patient presents with  . Insect Bite    HPI AISON [name] is a 57 y.o. male with a medical history of alcohol use, substance and pancreatitis who presented to the ED with concerns of black widow spider bite. Patient states he thinks he was bitten 1 week ago because he saw the spider in his shoe and states that a toenail on his left foot is black. He also endorses fatigue and diarrhea. Denies fever, chills, chest pain, SOB, upper respiratory symptoms, abdominal pain, skin rashes/lesions or arthralgias. Patient has tried nothing prior to coming to the ED.   Past Medical History:  Diagnosis Date  . Pancreatitis     Patient Active Problem List   Diagnosis Date Noted  . HYPOKALEMIA 07/28/2010  . TOBACCO ABUSE 07/28/2010  . COCAINE ABUSE 07/28/2010  . NEUROPATHY 07/28/2010  . HYPONATREMIA 07/12/2010  . Alcohol abuse 07/12/2010  . Acute pancreatitis 07/12/2010    Past Surgical History:  Procedure Laterality Date  . NO PAST SURGERIES          Home Medications    Prior to Admission medications   Medication Sig Start Date End Date Taking? Authorizing Provider  albuterol (PROVENTIL HFA;VENTOLIN HFA) 108 (90 Base) MCG/ACT inhaler Inhale 2 puffs into the lungs every 4 (four) hours as needed for wheezing or shortness of breath. 11/24/16   Hayden Rasmussen, NP  aspirin 325 MG tablet Take 975 mg by mouth every 6 (six) hours as needed for mild pain or headache.    [provider]  diphenoxylate-atropine (LOMOTIL) 2.5-0.025 MG tablet Take 1 tablet by mouth 4 (four) times daily as needed for diarrhea or loose stools. 03/17/17   Bethel Born, PA-C  ibuprofen (ADVIL,MOTRIN) 200 MG tablet Take 200 mg by mouth every 6 (six) hours as needed for mild pain.    [provider]  indomethacin (INDOCIN) 25 MG capsule  Take 1 capsule (25 mg total) by mouth 3 (three) times daily as needed. Patient not taking: Reported on 03/17/2017 09/22/15   Antony Madura, PA-C  ondansetron (ZOFRAN ODT) 4 MG disintegrating tablet Take 1 tablet (4 mg total) by mouth every 8 (eight) hours as needed for nausea or vomiting. 03/17/17   Bethel Born, PA-C  oxyCODONE-acetaminophen (PERCOCET/ROXICET) 5-325 MG tablet Take 1-2 tablets by mouth every 6 (six) hours as needed for severe pain. 11/21/17   Benjiman Core, MD  promethazine (PHENERGAN) 25 MG tablet Take 1 tablet (25 mg total) by mouth every 6 (six) hours as needed for nausea or vomiting. Patient not taking: Reported on 03/17/2017 10/30/16   Jacalyn Lefevre, MD    Family History Family History  Problem Relation Age of Onset  . Pancreatitis Neg Hx     Social History Social History   Tobacco Use  . Smoking status: Current Every Day Smoker    Packs/day: 1.00    Types: Cigarettes  . Smokeless tobacco: Current User  Substance Use Topics  . Alcohol use: Yes  . Drug use: No     Allergies   Patient has no known allergies.   Review of Systems Review of Systems  Constitutional: Positive for fatigue. Negative for activity change, appetite change, chills, diaphoresis and fever.  HENT: Negative.   Eyes: Negative.   Respiratory: Negative for cough, chest tightness and shortness of breath.   Cardiovascular: Negative  for chest pain, palpitations and leg swelling.  Gastrointestinal: Positive for diarrhea. Negative for abdominal pain, constipation, nausea and vomiting.  Genitourinary: Negative.   Musculoskeletal: Negative for arthralgias, gait problem and joint swelling.  Skin: Negative for color change, rash and wound.  Neurological: Negative for dizziness, weakness, light-headedness, numbness and headaches.  Hematological: Negative.      Physical Exam Updated Vital Signs BP 133/83 (BP Location: Right Arm)   Pulse 62   Temp 97.7 F (36.5 C) (Oral)   Resp 18    SpO2 99%   Physical Exam  Constitutional: Vital signs are normal. He appears well-developed and well-nourished. He is cooperative. He does not have a sickly appearance.  HENT:  Head: Normocephalic.  Eyes: Pupils are equal, round, and reactive to light. EOM and lids are normal.  Neck: Normal range of motion and full passive range of motion without pain. Neck supple.  Cardiovascular: Normal rate, regular rhythm, normal heart sounds and normal pulses.  Pulses:      Dorsalis pedis pulses are 2+ on the right side, and 2+ on the left side.       Posterior tibial pulses are 2+ on the right side, and 2+ on the left side.  Pulmonary/Chest: Effort normal and breath sounds normal.  Abdominal: Soft. Normal appearance and bowel sounds are normal. There is no tenderness.  Musculoskeletal: Normal range of motion.       Left foot: There is bony tenderness. There is normal range of motion, no tenderness, no swelling and no deformity.  Ecchymosis seen under left 2nd toe. Bony tenderness along the 2nd toe. Able to bear weight and ambulate without issue or pain. No bite marks appreciated on left lower extremities. Feet are non-erythematous and equal sizes bilaterally.  Feet:  Left Foot:  Skin Integrity: Negative for ulcer, blister, erythema or warmth.  Neurological: He is alert. He has normal strength. No cranial nerve deficit or sensory deficit. He exhibits normal muscle tone.  Patient staggers when he stands.  Skin: Skin is warm. Capillary refill takes 2 to 3 seconds. Ecchymosis noted. No rash noted.  Psychiatric: He has a normal mood and affect. Thought content normal. His speech is slurred. He is slowed.  Appears intoxicated. Slurred speech and delay movements.     ED Treatments / Results  Labs (all labs ordered are listed, but only abnormal results are displayed) Labs Reviewed  BASIC METABOLIC PANEL - Abnormal; Notable for the following components:      Result Value   Glucose, Bld 110 (*)    All  other components within normal limits  CBC WITH DIFFERENTIAL/PLATELET  URINALYSIS, ROUTINE W REFLEX MICROSCOPIC    EKG None  Radiology Dg Foot Complete Left  Result Date: 05/12/2018 CLINICAL DATA:  57 y/o  M; left second toe insect bite. EXAM: LEFT FOOT - COMPLETE 3+ VIEW COMPARISON:  None. FINDINGS: There is no evidence of fracture or dislocation. There is no evidence of arthropathy or other focal bone abnormality. Soft tissues are unremarkable. IMPRESSION: No acute osseous abnormality identified. Electronically Signed   By: Mitzi Hansen M.D.   On: 05/12/2018 22:50    Procedures Procedures (including critical care time)  Medications Ordered in ED Medications - No data to display   Initial Impression / Assessment and Plan / ED Course  Triage vital signs and the nursing notes have been reviewed.  Pertinent labs & imaging results that were available during care of the patient were reviewed and considered in medical decision making (see chart  for details).  Patient presents afebrile and with normal vital signs. It is suspected that patient is intoxicated as the odor of EtOH is detectable in the room and patient's speech is slurred and has delayed responses to questions. No focal neuro deficits on exam. Patient is concerned about spider bite, but there are no derm abnormalities on exam. No rashes, erythema, edema or bite marks appreciated. Upper and lower extremities have full active and passive ROM and are generally non-tender to palpation and with movement.  Labs evaluated due to patient's complaints of fatigue and diarrhea. No abnormalities seen there. This is possibly to EtOH use. He reports daily alcohol intake and reports increased stress at work and with caring for his ill mother which could be contributing to fatigue as well.    Clinical Course as of May 13 1714  Sat May 12, 2018  2300 No acute findings on foot x-ray. No fractures, dislocations or signs of erosion.    [GM]  2300 Prelim labs normal.   [GM]  2320 UA not suggestive of UTI.   [GM]    Clinical Course User Index [GM] Mortis, Sharyon MedicusGabrielle I, PA-C   Final Clinical Impressions(s) / ED Diagnoses  1. Left Foot Pain. Ecchymosis underneath 2nd toe indicative of past trauma or injury given pt's occupation. No signs of insect bite or skin/soft tissue infection. Education on OTC and supportive relief for pain given. 2. Viral Illness vs Alcohol Intoxication. Patient intoxicated in the ED as evidenced by presentation and the detectable odor of alcohol.   Dispo: Home. After thorough clinical evaluation, this patient is determined to be medically stable and can be safely discharged with the previously mentioned treatment and/or outpatient follow-up/referral(s). At this time, there are no other apparent medical conditions that require further screening, evaluation or treatment.   Final diagnoses:  Pain of toe of left foot  Viral illness    ED Discharge Orders    None        Reva BoresMortis, Gabrielle I, PA-C 05/13/18 1722    Linwood DibblesKnapp, Jon, MD 05/15/18 [id number]

## 2018-05-13 NOTE — Discharge Instructions (Signed)
Your foot x-ray was normal. There are no fractures or dislocations. Also the area around the toe is not consistent with a spider or insect bite. It is a good thing that there is not any rashes, redness, ulcers or bite marks seen.  I believe your fatigue and diarrhea could be symptoms from a viral illness. Blood work looked good today. Continue to rest and stay hydrated!

## 2018-09-10 ENCOUNTER — Observation Stay (HOSPITAL_COMMUNITY)
Admission: EM | Admit: 2018-09-10 | Discharge: 2018-09-12 | Disposition: A | Discharge status: Home / Self Care | Payer: BLUE CROSS/BLUE SHIELD | Attending: Family Medicine | Admitting: Family Medicine

## 2018-09-10 ENCOUNTER — Other Ambulatory Visit: Payer: Self-pay

## 2018-09-10 ENCOUNTER — Encounter (HOSPITAL_COMMUNITY): Payer: Self-pay | Admitting: *Deleted

## 2018-09-10 DIAGNOSIS — R7401 Elevation of levels of liver transaminase levels: Secondary | ICD-10-CM | POA: Diagnosis present

## 2018-09-10 DIAGNOSIS — E876 Hypokalemia: Secondary | ICD-10-CM | POA: Insufficient documentation

## 2018-09-10 DIAGNOSIS — K760 Fatty (change of) liver, not elsewhere classified: Secondary | ICD-10-CM | POA: Diagnosis not present

## 2018-09-10 DIAGNOSIS — D696 Thrombocytopenia, unspecified: Secondary | ICD-10-CM | POA: Diagnosis not present

## 2018-09-10 DIAGNOSIS — R935 Abnormal findings on diagnostic imaging of other abdominal regions, including retroperitoneum: Secondary | ICD-10-CM

## 2018-09-10 DIAGNOSIS — K860 Alcohol-induced chronic pancreatitis: Secondary | ICD-10-CM | POA: Insufficient documentation

## 2018-09-10 DIAGNOSIS — Z23 Encounter for immunization: Secondary | ICD-10-CM | POA: Insufficient documentation

## 2018-09-10 DIAGNOSIS — E871 Hypo-osmolality and hyponatremia: Secondary | ICD-10-CM | POA: Diagnosis not present

## 2018-09-10 DIAGNOSIS — R05 Cough: Secondary | ICD-10-CM

## 2018-09-10 DIAGNOSIS — E875 Hyperkalemia: Secondary | ICD-10-CM | POA: Insufficient documentation

## 2018-09-10 DIAGNOSIS — J111 Influenza due to unidentified influenza virus with other respiratory manifestations: Secondary | ICD-10-CM

## 2018-09-10 DIAGNOSIS — K8689 Other specified diseases of pancreas: Secondary | ICD-10-CM | POA: Diagnosis not present

## 2018-09-10 DIAGNOSIS — R059 Cough, unspecified: Secondary | ICD-10-CM

## 2018-09-10 DIAGNOSIS — F17210 Nicotine dependence, cigarettes, uncomplicated: Secondary | ICD-10-CM | POA: Insufficient documentation

## 2018-09-10 DIAGNOSIS — R52 Pain, unspecified: Secondary | ICD-10-CM | POA: Diagnosis present

## 2018-09-10 DIAGNOSIS — R197 Diarrhea, unspecified: Secondary | ICD-10-CM | POA: Diagnosis not present

## 2018-09-10 DIAGNOSIS — K861 Other chronic pancreatitis: Secondary | ICD-10-CM | POA: Insufficient documentation

## 2018-09-10 DIAGNOSIS — D649 Anemia, unspecified: Secondary | ICD-10-CM | POA: Diagnosis not present

## 2018-09-10 DIAGNOSIS — F1010 Alcohol abuse, uncomplicated: Secondary | ICD-10-CM | POA: Diagnosis not present

## 2018-09-10 DIAGNOSIS — R69 Illness, unspecified: Secondary | ICD-10-CM

## 2018-09-10 DIAGNOSIS — R740 Nonspecific elevation of levels of transaminase and lactic acid dehydrogenase [LDH]: Secondary | ICD-10-CM | POA: Diagnosis not present

## 2018-09-10 DIAGNOSIS — N2889 Other specified disorders of kidney and ureter: Secondary | ICD-10-CM | POA: Insufficient documentation

## 2018-09-10 NOTE — ED Triage Notes (Signed)
Pt reports feeling feverish, body aches and cough for 2 days, took asa for symptoms just PTA.

## 2018-09-11 ENCOUNTER — Encounter (HOSPITAL_COMMUNITY): Payer: Self-pay | Admitting: General Practice

## 2018-09-11 ENCOUNTER — Observation Stay (HOSPITAL_COMMUNITY): Payer: BLUE CROSS/BLUE SHIELD

## 2018-09-11 ENCOUNTER — Emergency Department (HOSPITAL_COMMUNITY): Payer: BLUE CROSS/BLUE SHIELD

## 2018-09-11 DIAGNOSIS — R7401 Elevation of levels of liver transaminase levels: Secondary | ICD-10-CM | POA: Diagnosis present

## 2018-09-11 DIAGNOSIS — R945 Abnormal results of liver function studies: Secondary | ICD-10-CM

## 2018-09-11 DIAGNOSIS — F1020 Alcohol dependence, uncomplicated: Secondary | ICD-10-CM

## 2018-09-11 DIAGNOSIS — K861 Other chronic pancreatitis: Secondary | ICD-10-CM

## 2018-09-11 DIAGNOSIS — R740 Nonspecific elevation of levels of transaminase and lactic acid dehydrogenase [LDH]: Secondary | ICD-10-CM | POA: Diagnosis not present

## 2018-09-11 LAB — INFLUENZA PANEL BY PCR (TYPE A & B)
INFLAPCR: NEGATIVE
Influenza B By PCR: NEGATIVE

## 2018-09-11 LAB — CBC WITH DIFFERENTIAL/PLATELET
Abs Immature Granulocytes: 0.02 10*3/uL (ref 0.00–0.07)
Basophils Absolute: 0.1 10*3/uL (ref 0.0–0.1)
Basophils Relative: 1 %
EOS ABS: 0 10*3/uL (ref 0.0–0.5)
EOS PCT: 0 %
HEMATOCRIT: 37.7 % — AB (ref 39.0–52.0)
HEMOGLOBIN: 13 g/dL (ref 13.0–17.0)
Immature Granulocytes: 0 %
LYMPHS ABS: 1.4 10*3/uL (ref 0.7–4.0)
LYMPHS PCT: 26 %
MCH: 31.4 pg (ref 26.0–34.0)
MCHC: 34.5 g/dL (ref 30.0–36.0)
MCV: 91.1 fL (ref 80.0–100.0)
MONO ABS: 0.4 10*3/uL (ref 0.1–1.0)
MONOS PCT: 7 %
NRBC: 0 % (ref 0.0–0.2)
Neutro Abs: 3.5 10*3/uL (ref 1.7–7.7)
Neutrophils Relative %: 66 %
Platelets: 129 10*3/uL — ABNORMAL LOW (ref 150–400)
RBC: 4.14 MIL/uL — ABNORMAL LOW (ref 4.22–5.81)
RDW: 14.7 % (ref 11.5–15.5)
WBC: 5.3 10*3/uL (ref 4.0–10.5)

## 2018-09-11 LAB — URINALYSIS, ROUTINE W REFLEX MICROSCOPIC
BILIRUBIN URINE: NEGATIVE
Bacteria, UA: NONE SEEN
Glucose, UA: NEGATIVE mg/dL
HGB URINE DIPSTICK: NEGATIVE
KETONES UR: 5 mg/dL — AB
Nitrite: NEGATIVE
Protein, ur: NEGATIVE mg/dL
Specific Gravity, Urine: >1.046 — ABNORMAL HIGH (ref 1.005–1.030)
pH: 5 (ref 5.0–8.0)

## 2018-09-11 LAB — CBC
HCT: 34.6 % — ABNORMAL LOW (ref 39.0–52.0)
Hemoglobin: 12 g/dL — ABNORMAL LOW (ref 13.0–17.0)
MCH: 31.9 pg (ref 26.0–34.0)
MCHC: 34.7 g/dL (ref 30.0–36.0)
MCV: 92 fL (ref 80.0–100.0)
PLATELETS: 87 10*3/uL — AB (ref 150–400)
RBC: 3.76 MIL/uL — ABNORMAL LOW (ref 4.22–5.81)
RDW: 15.1 % (ref 11.5–15.5)
WBC: 4.9 10*3/uL (ref 4.0–10.5)
nRBC: 0 % (ref 0.0–0.2)

## 2018-09-11 LAB — COMPREHENSIVE METABOLIC PANEL
ALT: 256 U/L — ABNORMAL HIGH (ref 0–44)
AST: 699 U/L — ABNORMAL HIGH (ref 15–41)
Albumin: 3 g/dL — ABNORMAL LOW (ref 3.5–5.0)
Alkaline Phosphatase: 125 U/L (ref 38–126)
Anion gap: 11 (ref 5–15)
BILIRUBIN TOTAL: 4 mg/dL — AB (ref 0.3–1.2)
BUN: 10 mg/dL (ref 6–20)
CHLORIDE: 97 mmol/L — AB (ref 98–111)
CO2: 23 mmol/L (ref 22–32)
CREATININE: 0.68 mg/dL (ref 0.61–1.24)
Calcium: 8.4 mg/dL — ABNORMAL LOW (ref 8.9–10.3)
GFR calc Af Amer: >60 mL/min (ref >60)
GLUCOSE: 103 mg/dL — AB (ref 70–99)
POTASSIUM: 5.2 mmol/L — AB (ref 3.5–5.1)
Sodium: 131 mmol/L — ABNORMAL LOW (ref 135–145)
Total Protein: 6.6 g/dL (ref 6.5–8.1)

## 2018-09-11 LAB — PROTIME-INR
INR: 1.36
Prothrombin Time: 16.6 seconds — ABNORMAL HIGH (ref 11.4–15.2)

## 2018-09-11 LAB — RAPID URINE DRUG SCREEN, HOSP PERFORMED
Amphetamines: NOT DETECTED
Barbiturates: NOT DETECTED
Benzodiazepines: NOT DETECTED
Cocaine: NOT DETECTED
Opiates: NOT DETECTED
Tetrahydrocannabinol: NOT DETECTED

## 2018-09-11 LAB — CREATININE, SERUM
Creatinine, Ser: 0.84 mg/dL (ref 0.61–1.24)
GFR calc Af Amer: >60 mL/min (ref >60)
GFR calc non Af Amer: >60 mL/min (ref >60)

## 2018-09-11 LAB — LIPASE, BLOOD: LIPASE: 20 U/L (ref 11–51)

## 2018-09-11 LAB — ACETAMINOPHEN LEVEL: Acetaminophen (Tylenol), Serum: <10 ug/mL — ABNORMAL LOW (ref 10–30)

## 2018-09-11 MED ORDER — VITAMIN B-1 100 MG PO TABS
100.0000 mg | ORAL_TABLET | Freq: Every day | ORAL | Status: DC
  Administered 2018-09-11 – 2018-09-12 (×2): 100 mg via ORAL
  Filled 2018-09-11 (×2): qty 1

## 2018-09-11 MED ORDER — PANCRELIPASE (LIP-PROT-AMYL) 12000-38000 UNITS PO CPEP
24000.0000 [IU] | ORAL_CAPSULE | Freq: Three times a day (TID) | ORAL | Status: DC
  Administered 2018-09-11 – 2018-09-12 (×2): 24000 [IU] via ORAL
  Filled 2018-09-11 (×4): qty 2

## 2018-09-11 MED ORDER — IOHEXOL 300 MG/ML  SOLN
100.0000 mL | Freq: Once | INTRAMUSCULAR | Status: AC | PRN
  Administered 2018-09-11: 100 mL via INTRAVENOUS

## 2018-09-11 MED ORDER — OSELTAMIVIR PHOSPHATE 75 MG PO CAPS
75.0000 mg | ORAL_CAPSULE | Freq: Once | ORAL | Status: AC
  Administered 2018-09-11: 75 mg via ORAL
  Filled 2018-09-11: qty 1

## 2018-09-11 MED ORDER — ENOXAPARIN SODIUM 40 MG/0.4ML ~~LOC~~ SOLN
40.0000 mg | SUBCUTANEOUS | Status: DC
  Administered 2018-09-11: 40 mg via SUBCUTANEOUS
  Filled 2018-09-11: qty 0.4

## 2018-09-11 MED ORDER — ALBUTEROL SULFATE (2.5 MG/3ML) 0.083% IN NEBU: 3.0000 mL | INHALATION_SOLUTION | RESPIRATORY_TRACT | Status: DC | PRN

## 2018-09-11 MED ORDER — ONDANSETRON HCL 4 MG/2ML IJ SOLN
4.0000 mg | Freq: Once | INTRAMUSCULAR | Status: AC
  Administered 2018-09-11: 4 mg via INTRAVENOUS
  Filled 2018-09-11: qty 2

## 2018-09-11 MED ORDER — INFLUENZA VAC SPLIT QUAD 0.5 ML IM SUSY
0.5000 mL | PREFILLED_SYRINGE | INTRAMUSCULAR | Status: AC
  Administered 2018-09-12: 0.5 mL via INTRAMUSCULAR
  Filled 2018-09-11 (×2): qty 0.5

## 2018-09-11 MED ORDER — ONDANSETRON HCL 4 MG/2ML IJ SOLN: 4.0000 mg | Freq: Four times a day (QID) | INTRAMUSCULAR | Status: DC | PRN

## 2018-09-11 MED ORDER — SODIUM CHLORIDE 0.9 % IV SOLN
INTRAVENOUS | Status: DC
  Administered 2018-09-11: 18:00:00 via INTRAVENOUS

## 2018-09-11 MED ORDER — ONDANSETRON HCL 4 MG PO TABS: 4.0000 mg | ORAL_TABLET | Freq: Four times a day (QID) | ORAL | Status: DC | PRN

## 2018-09-11 MED ORDER — SODIUM CHLORIDE 0.9 % IV BOLUS
1000.0000 mL | Freq: Once | INTRAVENOUS | Status: AC
  Administered 2018-09-11: 1000 mL via INTRAVENOUS

## 2018-09-11 MED ORDER — FOLIC ACID 1 MG PO TABS
1.0000 mg | ORAL_TABLET | Freq: Every day | ORAL | Status: DC
  Administered 2018-09-11 – 2018-09-12 (×2): 1 mg via ORAL
  Filled 2018-09-11 (×2): qty 1

## 2018-09-11 NOTE — ED Provider Notes (Signed)
Patient signed out to me by Fayrene HelperBowie Tran, PA-C with consult to GI pending.  Patient with transaminitis and a obstructed pancreatic stone on CT.  Briefly this is a 57 year old male with a history of substance abuse, alcohol abuse, recurrent pancreatitis presenting for subjective fevers, chills, body aches, headache, sinus congestion, sneezing, coughing, nausea, vomiting, diarrhea, abdominal discomfort.  Lab work was obtained and flu is negative.  Patient without leukocytosis.  His lipase was within normal limits.  CMP was significant for transaminitis with AST of 629, ALT of 256 and elevated bilirubin at 4.  Tylenol, PT-INR, and hepatitis panel were obtained.  CT scan was ordered.  GI was consulted and recommended following up after results.  CT scan shows a diffusely dilated main pancreatic duct with moderate atrophy of the gland likely secondary to a obstructing stone at the ampulla.  MRI is recommended.  Will consult GI. Has been NPO since last night.   Gen: afebrile, VSS HEENT: Atraumatic, EOMI. No scleral icterus Resp: no resp distress CV: RRR Abd: soft with mild tenderness to the epigastric area.  No right upper quadrant tenderness.  Negative Murphy sign.  No rebound, rigidity or guarding.  No significant jaundice MsK: moving all extremities well Neuro: A&O x4  7:41 AM Discussed case with GI, Dr. Rhea BeltonPyrtle who will come see the patient. Patient made aware and states understanding. Pain and nausea controlled at this time.   1:26 PM Reconsulted GI for update.  2:17 PM I discussed case with GI.  They do not think the patient's transaminitis is related to the stone.  Likely an incidental finding.  Recommend admission for observation to observe transaminitis (trend) and also to make sure symptoms do not worsen.  Will admit to medicine.  2:39 PM I appreciate family medicine for agreeing to admit the patient to their service.   Jacinto HalimMaczis, Korby Ratay M, PA-C 09/11/18 1439    Gilda CreasePollina, Christopher J,  MD 09/12/18 [phone]

## 2018-09-11 NOTE — ED Notes (Signed)
Jennye MoccasinSarah gribbin GI  Pa into sxee pt  Pt may eat diet ordered

## 2018-09-11 NOTE — Progress Notes (Signed)
I saw and examined Mr. Weldin.  I discussed with the resident team and we agreed upon a plan.  I will co-sign the H&PE when it is available.  Briefly 57 yo male with a story that is difficult to put together fully. 6 months of diarrhea 1 month of increased cough 3-4 days of anorexia, decreased PO intake and generalized weakness. No fever documented.  Has been taking ASA so fever could be masked Known EtOH abuse Known tobacco abuse. Eval in ER reveals sig transaminitis and 7 mm pancreatic duct and or CBD duct stone.  Lipase normal  Issues: Unclear to me how much of this is an acute versus subacute illness. Transaminitis may be from EtOHism or some acute viral illnesses including the typical infectious hepatitises.  An obstructing CBD stone could also explain the sx of the last 3-4 days. Cough x 1 month - I favor previously undiagnosed COPD.  No acute wheezing now.  Needs CXR.  Likely benefit from LAMA at DC.  Don't feel he needs steroids or antibiotics (do not treat as COPD exac) unless the CXR surprises me. Diarrhea x 6 months.  Pancreatic insufficiency?  Agree with PO pancreatic enzymes.    Admit for Obs.  If tolerating PO and no additional surprises, likely DC tomorrow to continue WU as outpatient.

## 2018-09-11 NOTE — ED Notes (Signed)
Pt eating meal tray 

## 2018-09-11 NOTE — Consult Note (Addendum)
[facility] Consult: 9:38 AM 09/11/2018  LOS: 0 days    Referring Provider: Dr Betsey Holiday  Primary Care Physician: Patient does not have a PCP. Primary Gastroenterologist:  unassigned   Reason for Consultation:  Pancreatic duct stone, obstruction.    HPI: Samuel [name] is a 57 y.o. male.  PMH cocaine and alcohol abuse.  Occurrences of alcoholic pancreatitis eating to previous hospital admissions the latest was in 04/2015.   In 04/2015 he had peripancreatic fluid without clear pseudocyst. 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  Trauma related to intoxication induced falls.  Fatty liver.   For a few to several months patient's been having loose stools which he treats with bismuth and Imodium.  He does not have abdominal pain.  Appetite is good.  Weight stable.  No abdominal distention or swelling. Over the weekend the patient developed acute respiratory symptoms: Cough with thick yellow sputum, left-sided trapezius neck pain malaise.  More frequent watery, nonbloody bowel movements.  Felt some fevers but no sweats or Rigors.  He self treated at home with a couple of aspirin a day and has taken a couple of "Airborne" OTC cold medications.  Presented to the ED today for evaluation.  Incidental findings of elevated LFTs noted which led to a CT scan.  Lipase 20.  T bili 4.0.  Alk phos 125.  AST/ALT 699/256. Sodium 131.  Potassium 5.2.  BUN/creatinine normal. PT/INR 16.6/1.3. Pap level less than 10. CBC normal Flu test negative CT of the abdomen pelvis shows calcification at the pancreatic uncinate, likely sequela of chronic pancreatitis.  7 mm stone in the pancreatic duct at the level of the ampulla.  Main pancreatic duct up to 9 mm diameter.  MRI suggested for further evaluation.  Fatty  liver.  Prostamegaly.  Patient drinks about 36 ounces of beer a day.  On his days off, usually once a week, he drinks 6 beers a day area no additional alcoholic consumption.  No use of Tylenol/acetaminophen Works on the [employer] at [employer] and other products.  Lives with his longtime girlfriend.  Past Medical History:  Diagnosis Date  . Pancreatitis     Past Surgical History:  Procedure Laterality Date  . NO PAST SURGERIES      Prior to Admission medications   Medication Sig Start Date End Date Taking? Authorizing Provider  albuterol (PROVENTIL HFA;VENTOLIN HFA) 108 (90 Base) MCG/ACT inhaler Inhale 2 puffs into the lungs every 4 (four) hours as needed for wheezing or shortness of breath. 11/24/16  Yes Janne Napoleon, NP  aspirin 325 MG tablet Take 325 mg by mouth every 6 (six) hours as needed for mild pain or headache.    Yes [provider]  diphenoxylate-atropine (LOMOTIL) 2.5-0.025 MG tablet Take 1 tablet by mouth 4 (four) times daily as needed for diarrhea or loose stools. Patient not taking: Reported on 09/11/2018 03/17/17   Recardo Evangelist, PA-C  indomethacin (INDOCIN) 25 MG capsule Take 1 capsule (25 mg total) by mouth 3 (three) times daily as needed.  Patient not taking: Reported on 03/17/2017 09/22/15   Antonietta Breach, PA-C  ondansetron (ZOFRAN ODT) 4 MG disintegrating tablet Take 1 tablet (4 mg total) by mouth every 8 (eight) hours as needed for nausea or vomiting. Patient not taking: Reported on 09/11/2018 03/17/17   Recardo Evangelist, PA-C  oxyCODONE-acetaminophen (PERCOCET/ROXICET) 5-325 MG tablet Take 1-2 tablets by mouth every 6 (six) hours as needed for severe pain. Patient not taking: Reported on 09/11/2018 11/21/17   Davonna Belling, MD  promethazine (PHENERGAN) 25 MG tablet Take 1 tablet (25 mg total) by mouth every 6 (six) hours as needed for nausea or vomiting. Patient not taking: Reported on 03/17/2017 10/30/16   Isla Pence, MD      Scheduled Meds:  Infusions:  PRN Meds:    Allergies as of 09/10/2018  . (No Known Allergies)    Family History  Problem Relation Age of Onset  . Pancreatitis Neg Hx     Social History   Socioeconomic History  . Marital status: Divorced    Spouse name: Not on file  . Number of children: Not on file  . Years of education: Not on file  . Highest education level: Not on file  Occupational History  . Not on file  Social Needs  . Financial resource strain: Not on file  . Food insecurity:    Worry: Not on file    Inability: Not on file  . Transportation needs:    Medical: Not on file    Non-medical: Not on file  Tobacco Use  . Smoking status: Current Every Day Smoker    Packs/day: 1.00    Types: Cigarettes  . Smokeless tobacco: Current User  Substance and Sexual Activity  . Alcohol use: Yes  . Drug use: No  . Sexual activity: Not on file  Lifestyle  . Physical activity:    Days per week: Not on file    Minutes per session: Not on file  . Stress: Not on file  Relationships  . Social connections:    Talks on phone: Not on file    Gets together: Not on file    Attends religious service: Not on file    Active member of club or organization: Not on file    Attends meetings of clubs or organizations: Not on file    Relationship status: Not on file  . Intimate partner violence:    Fear of current or ex partner: Not on file    Emotionally abused: Not on file    Physically abused: Not on file    Forced sexual activity: Not on file  Other Topics Concern  . Not on file  Social History Narrative  . Not on file    REVIEW OF SYSTEMS: Constitutional: Fatigue, malaise. ENT:  No nose bleeds Pulm: Cough with sputum as per HPI. CV:  No palpitations, no LE edema.  No chest pain GU:  No hematuria, no frequency GI: No dysphagia, no pyrosis, no nausea or vomiting. Heme: No excessive or unusual bleeding or bruising. Transfusions: No previous blood transfusions or  anemia. Neuro:  No headaches, no peripheral tingling or numbness Derm:  No itching, no rash or sores.  Endocrine:  No sweats or chills.  No polyuria or dysuria Immunization: No recent flu shot. Travel:  None beyond local counties in last few months.    PHYSICAL EXAM: Vital signs in last 24 hours: Vitals:   09/11/18 0800 09/11/18 0830  BP: (!) 137/97 128/73  Pulse: (!) 53 (!) 49  Resp: (!) 22 19  Temp:    SpO2: 100% 98%   Wt Readings from Last 3 Encounters:  09/10/18 60.8 kg  05/28/17 62.6 kg  03/17/17 61.2 kg    General: Patient looks well.  He is comfortable, alert. Head: No facial asymmetry or swelling.  No signs of head trauma. Eyes: Scleral icterus.  No conjunctival pallor.  EOMI. Ears: Not hard of hearing. Nose: No congestion or discharge. Mouth: Oropharynx moist, clear, pink.  Tongue midline. Neck: JVD, no masses, no thyromegaly. Lungs: Productive cough.  Lungs clear bilaterally.  No respiratory distress. Heart: RRR.  No MRG.  S1, S2 present Abdomen: Soft without masses.  No distention, bruits, hernias, organomegaly.  Active bowel sounds..   Rectal: Deferred rectal exam. Musc/Skeltl: No joint redness or swelling.  No gross abnormalities Extremities: CCE. Neurologic: Alert.  Oriented x3.  Moves all 4 limbs with full strength.  No tremors.  No gross deficits. Skin: No rashes, no sores Tattoos: None observed. Nodes: No cervical adenopathy. Psych: Pleasant, calm, cooperative.  Fluid speech.  Intake/Output from previous day: No intake/output data recorded. Intake/Output this shift: Total I/O In: -  Out: 350 [Urine:350]  LAB RESULTS: Recent Labs    09/11/18 0330  WBC 5.3  HGB 13.0  HCT 37.7*  PLT 129*   BMET Lab Results  Component Value Date   NA 131 (L) 09/11/2018   NA 140 05/12/2018   NA 136 03/17/2017   K 5.2 (H) 09/11/2018   K 3.8 05/12/2018   K 4.2 03/17/2017   CL 97 (L) 09/11/2018   CL 103 05/12/2018   CL 101 03/17/2017   CO2 23 09/11/2018     CO2 26 05/12/2018   CO2 24 03/17/2017   GLUCOSE 103 (H) 09/11/2018   GLUCOSE 110 (H) 05/12/2018   GLUCOSE 92 03/17/2017   BUN 10 09/11/2018   BUN 8 05/12/2018   BUN 13 03/17/2017   CREATININE 0.68 09/11/2018   CREATININE 0.70 05/12/2018   CREATININE 0.80 03/17/2017   CALCIUM 8.4 (L) 09/11/2018   CALCIUM 9.2 05/12/2018   CALCIUM 9.0 03/17/2017   LFT Recent Labs    09/11/18 0330  PROT 6.6  ALBUMIN 3.0*  AST 699*  ALT 256*  ALKPHOS 125  BILITOT 4.0*   PT/INR Lab Results  Component Value Date   INR 1.36 09/11/2018   INR 0.9 07/01/2009   INR 1.0 12/24/2007   Hepatitis Panel No results for input(s): HEPBSAG, HCVAB, HEPAIGM, HEPBIGM in the last 72 hours. C-Diff No components found for: CDIFF Lipase     Component Value Date/Time   LIPASE 20 09/11/2018 0330    Drugs of Abuse     Component Value Date/Time   LABOPIA NONE DETECTED 11/13/2016 1734   COCAINSCRNUR NONE DETECTED 11/13/2016 1734   LABBENZ NONE DETECTED 11/13/2016 1734   AMPHETMU NONE DETECTED 11/13/2016 1734   THCU NONE DETECTED 11/13/2016 1734   LABBARB NONE DETECTED 11/13/2016 1734     RADIOLOGY STUDIES: Ct Abdomen Pelvis W Contrast  Result Date: 09/11/2018 CLINICAL DATA:  57 year old male with abdominal pain. EXAM: CT ABDOMEN AND PELVIS WITH CONTRAST TECHNIQUE: Multidetector CT imaging of the abdomen and pelvis was performed using the standard protocol following bolus administration of intravenous contrast. CONTRAST:  147m OMNIPAQUE IOHEXOL 300 MG/ML  SOLN COMPARISON:  CT of the abdomen pelvis dated 05/01/2015 FINDINGS: Lower chest: The visualized lung bases are clear. No intra-abdominal free air or free fluid. Hepatobiliary: There is diffuse fatty infiltration of the liver. No intrahepatic biliary  ductal dilatation. The gallbladder is distended otherwise unremarkable. Pancreas: There are coarse calcifications of the uncinate process of the pancreas, likely sequela of chronic pancreatitis. There is  diffuse dilatation of the main pancreatic duct measuring up to 9 mm in diameter. There is a 7 mm stone in the pancreatic duct at the ampulla. No definite active inflammatory changes. Further evaluation with MRI without and with contrast is recommended to exclude an obstructing mass. Spleen: Normal in size without focal abnormality. Adrenals/Urinary Tract: The adrenal glands are unremarkable. There is no hydronephrosis on either side. There is symmetric enhancement and excretion of contrast by both kidneys. A 1 cm exophytic hypodense lesion with layering curvilinear high attenuating content along the inferolateral aspect of the left kidney is not well characterized. Additional subcentimeter bilateral renal hypodense lesions are not characterized. The visualized ureters and urinary bladder appear unremarkable. Stomach/Bowel: There is no bowel obstruction or active inflammation. Normal appendix. Vascular/Lymphatic: The abdominal aorta and IVC appear unremarkable. No portal venous gas. There is no adenopathy. Reproductive: Enlarged prostate gland measuring 4.8 cm in transverse axial diameter. Other: None Musculoskeletal: No acute or significant osseous findings. IMPRESSION: 1. Diffusely dilatation of the main pancreatic duct with moderate atrophy of the gland likely sequela of an obstructing stone at the ampulla. Further evaluation with MRI without and with contrast is recommended to exclude underlying lesion. No definite active inflammatory changes. 2. Fatty liver. 3. No bowel obstruction or active inflammation. Normal appendix. Electronically Signed   By: Anner Crete M.D.   On: 09/11/2018 06:35     IMPRESSION:   *   Chronic pancreatitis.  Now with pancreatic duct stone The CBD and intrahepatic ducts are not dilated/obstructed,  No symptoms other than diarrhea which are referrable to chronic pancreatitis.  Suspect his diarrhea is from malabsorption.  *   Elevated LFTs.  The AST is greater than ALT but  the transaminase numbers are quite high for simply alcoholic hepatitis.  Suspect there is a component of alcoholic hepatitis. Acute hepatitis serologies drawn and results pending. Airborne over-the-counter supplement contains multiple vitamins and minerals as well as several herbal extracts, amino acids.  Possibly some of the ingredients in this medication could have caused drug-induced liver injury.  *   Hyperkalemia.    *    Chronic alcoholism.  *    Bilateral renal lesions.  Not well defined by CT scan.  *   Noncritical thrombocytopenia.  History of same, probably due to alcohol.   PLAN:     *   No plans for further work-up of the pancreatic duct stone. Would admit the patient for observation and recheck of his LFTs in the morning.  Await the results of hepatitis serologies. Should can eat and I ordered a regular diet.  Patient might benefit from Creon so have added this to see if it helps the diarrhea.  *   Watch for signs of alcohol withdrawal.  *   CMET in AM.     Azucena Freed  09/11/2018, 9:38 AM Phone [phone]  GI ATTENDING  History, laboratories, x-rays reviewed.  Patient seen and examined.  Agree with comprehensive consultation note as outlined above.  Patient has chronic alcoholism and drug abuse.  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  Pancreatic ductal stone noted.  This does not need addressed at this time.  Abnormal liver tests are likely combination of alcohol and an unidentified toxin (was taking some over-the-counter anti-flu or cold medicine).  Agree with checking  for acute viral etiologies.  Trend liver test.  Watch for alcohol withdrawal.  STOP DRINKING ALCOHOL FOREVER.  Docia Chuck. Geri Seminole., M.D. [facility] Division of Gastroenterology

## 2018-09-11 NOTE — H&P (Addendum)
[facility] Admission History and Physical Service Pager: [phone]  Patient name: Samuel [name] Medical record number: [id number] Date of birth: 12-22-1960 Age: 57 y.o. Gender: male  Primary Care Provider: Patient, No Pcp Per Consultants: GI  Code Status: Full   Chief Complaint: malaise, cough   Assessment and Plan: Samuel [name] is a 57 y.o. male presenting with a few day history of malaise, subjective fever, and found to have a transaminitis and a 69m pancreatitic stone. PMH is significant for alcohol use, previous cocaine use, and chronic/recurrent pancreatitis.   Transaminitis  In setting of alcohol use and PD stone: Unknown chronicity, likely acute.    Etiology includes chronic vs acute on chronic insult to liver. Hepatitis vs chronic pancreatitis with viral insult also in differential. Patient reports a 3-day history of general malaise, subjective fever, and poor appetite.  No endorsement abdominal pain, however has some minimal diffuse "soreness" with palpation of his abdomen. VSS, afebrile, mentating well.  AST 699, ALT 2093in an alcoholic pattern, bilirubin 4.0, alk phos WNL. No leukocytosis. Synthetic function preserved, however platelets 129, thought to be may be secondary to alcoholic suppression. CT abdomen showing fatty infiltration of the liver and a 9 mm dilation in the pancreatic duct, with a 7 mm stone at the ampulla. Concern for hepatobiliary obstruction with the pancreatic duct stone in the ampulla given the hepatocellular pattern of his liver function abnormalities, however would suspect dilation in his CBD as well. Also high on the differential includes alcoholic hepatitis given his long-standing alcohol use and the alcoholic pattern of his LFTs with AST 2x > ALT. Could consider additional toxins within the OTC medications he has been taking Considered Tylenol use, however acetaminophen level <10.  Unlikely viral hepatitis, but panel pending.   --Admit to observation, attending Dr. HAndria Frames- GI consulted in the ED, no surgical intervention at this time and recommending trending liver tests - Follow-up viral hepatitis panel - Trend LFTs, monitor CMP and CBC - mIVF NS 100 ml/hr  - Vitals per routine - Monitor for signs of alcohol withdrawal as below - Consult to case management for PCP  Pancreatic Duct Stone: 7 mm stone within the ampulla of the main pancreatic duct.  Coarse calcifications noted on the pancreas consistent with chronic pancreatitis.  GI has already evaluated and not recommending any surgical intervention at this point. The stone was likely incidental, as would expect acute inflammation within his pancreas and an elevation in his lipase, however this is within normal limits (20).  However we will continue to monitor, especially given transaminitis as above. -GI on board, appreciate recommendations -Obtain lipase in a.m. -CMP and CBC -Monitor symptoms  Thrombocytopenia: Chronic.  Platelets 129 on admit. Intermittently low in the past.  Question bone marrow suppression from alcoholism vs lowering hepatic function.  No splenomegaly noted on exam or CT to suggest sequestration.  -Monitor CBC   Productive Cough: Subacute.  6-week history of cough with clear-yellow sputum production without any shortness of breath.  Extensive smoking history, continues to smoke 3 packs/week per report.  Lungs are generally clear on exam, some coarse breath sounds in the anterior fields.  Satting well on RA.  Influenza negative. could consider URI versus bronchitis versus undiagnosed COPD.  Unlikely pneumonia as this is subacute in nature and he is afebrile without a leukocytosis. -Obtain CXR  -Consider PFT's outpatient  -Albuterol neb every 4 PRN  -May add cough suppressant if becomes bothersome   Hypokalemia: Acute  Most likely due to poor hydration status and decreased po intake over several days - monitor BMP daily - If >5.5 give  Kayexalate 15g and recheck BMP  Hyponatremia: Acute Most likely due to poor hydration status - IV fluids NS at maintenance rate - daily BMP  Chronic pancreatitis, in setting of long-standing alcohol use: Patient reports a 67-monthhistory of diarrhea that has been unchanged in severity or frequency since onset. This is most likely consistent with poor absorption given his history of chronic pancreatitis without supplementation.  Unlikely viral or bacterial etiology given chronicity.  -Started on Creon per GI -Monitor BM  Alcohol abuse: Chronic. Reports drinking 3 beers daily, " since I was a teenager."  No history of hospitalizations for withdrawal.  -CIWA protocol -Provide folate and thiamine  Cocaine abuse: Reported on problem list, however patient denies any current or previous use of cocaine. -UDS  FEN/GI: Regular diet, IV fluids Prophylaxis: Lovenox  Disposition: Admit to observation, Attending Dr. HAndria Frames  History of Present Illness:  Samuel STEERSis a 57y.o. male presenting with a few day history of generalized malaise, subjective fever, and poor appetite.  He also endorses a 641-monthistory of diarrhea and a few week history of productive cough with yellow sputum.  He denies progressive generalized fatigue starting this past Sunday and is felt more hot than usual at home.  Has not eaten much since Sunday as well, minimally drinking some fluids.  He is tried Airborne and cough syrup over-the-counter to help with his symptoms, however this is not helped.  Was not able to go to work yesterday due to feeling sick.  He has had a productive cough for several weeks now, that is not progressed in frequency or severity.  He denies any chills, sore throat, sinus congestion, rhinorrhea.  In terms of his 6-40-monthstory of diarrhea, he states this is not progressed since onset.  He denies any melena, however states his bowel movements have been anywhere from orange, green, to brown.  Denies  any concurrent nausea or vomiting.  He usually has a very good appetite and diet, and it is only been the last few days that he has not felt like eating as above.  He endorses drinking three 12 ounce cans of beer daily since he was a teenager and smokes about 3 packs of cigarettes a week.  He denies any current or previous use of illicit drugs including marijuana, cocaine, or heroin.  Has no primary care physician or specialist that he sees outpatient.  ED Course: On arrival, he was hemodynamically stable and in no acute distress.  Labs notable sodium 131, potassium 5.2, chloride 97, glucose 103, creatinine 0.68, alk phos 125, albumin 3, lipase 20, AST 699 and ALT 256, bilirubin 4, platelets 129.  Tylenol level under 10.  CT abdomen showing diffusely dilated main pancreatic duct with obstructing stone at the ampulla and fatty liver.  While in the ED he received 1 L normal saline bolus and Tamiflu.  GI evaluated him while in the ED, recommending no surgical intervention at this time.  Review Of Systems: Per HPI with the following additions:   Review of Systems  Constitutional: Positive for malaise/fatigue. Negative for chills, fever and weight loss.  HENT: Negative for congestion, sinus pain and sore throat.   Eyes: Negative for blurred vision.  Respiratory: Positive for cough and sputum production. Negative for hemoptysis, shortness of breath and wheezing.   Cardiovascular: Negative for chest pain, palpitations, orthopnea, claudication  and leg swelling.  Gastrointestinal: Positive for diarrhea. Negative for abdominal pain, blood in stool, constipation, melena, nausea and vomiting.  Genitourinary: Negative for dysuria.  Skin: Negative for rash.  Neurological: Negative for dizziness, focal weakness and seizures.    Patient Active Problem List   Diagnosis Date Noted  . HYPOKALEMIA 07/28/2010  . TOBACCO ABUSE 07/28/2010  . COCAINE ABUSE 07/28/2010  . NEUROPATHY 07/28/2010  . HYPONATREMIA  07/12/2010  . Alcohol abuse 07/12/2010  . Acute pancreatitis 07/12/2010    Past Medical History: Past Medical History:  Diagnosis Date  . Pancreatitis     Past Surgical History: Past Surgical History:  Procedure Laterality Date  . NO PAST SURGERIES      Social History: Social History   Tobacco Use  . Smoking status: Current Every Day Smoker    Packs/day: 1.00    Types: Cigarettes  . Smokeless tobacco: Current User  Substance Use Topics  . Alcohol use: Yes  . Drug use: No   Please also refer to relevant sections of EMR.  Family History: Family History  Problem Relation Age of Onset  . Pancreatitis Neg Hx     Allergies and Medications: No Known Allergies No current facility-administered medications on file prior to encounter.    Current Outpatient Medications on File Prior to Encounter  Medication Sig Dispense Refill  . albuterol (PROVENTIL HFA;VENTOLIN HFA) 108 (90 Base) MCG/ACT inhaler Inhale 2 puffs into the lungs every 4 (four) hours as needed for wheezing or shortness of breath. 1 Inhaler 0  . aspirin 325 MG tablet Take 325 mg by mouth every 6 (six) hours as needed for mild pain or headache.     . diphenoxylate-atropine (LOMOTIL) 2.5-0.025 MG tablet Take 1 tablet by mouth 4 (four) times daily as needed for diarrhea or loose stools. (Patient not taking: Reported on 09/11/2018) 30 tablet 0  . indomethacin (INDOCIN) 25 MG capsule Take 1 capsule (25 mg total) by mouth 3 (three) times daily as needed. (Patient not taking: Reported on 03/17/2017) 30 capsule 0  . ondansetron (ZOFRAN ODT) 4 MG disintegrating tablet Take 1 tablet (4 mg total) by mouth every 8 (eight) hours as needed for nausea or vomiting. (Patient not taking: Reported on 09/11/2018) 8 tablet 0  . oxyCODONE-acetaminophen (PERCOCET/ROXICET) 5-325 MG tablet Take 1-2 tablets by mouth every 6 (six) hours as needed for severe pain. (Patient not taking: Reported on 09/11/2018) 15 tablet 0  . promethazine (PHENERGAN)  25 MG tablet Take 1 tablet (25 mg total) by mouth every 6 (six) hours as needed for nausea or vomiting. (Patient not taking: Reported on 03/17/2017) 10 tablet 0    Objective: BP 113/66   Pulse (!) 48   Temp 98.2 F (36.8 C) (Oral)   Resp 15   Ht 5' 5"  (1.651 m)   Wt 60.8 kg   SpO2 98%   BMI 22.30 kg/m  Exam: General: Alert, NAD, thin older appearing than stated age gentleman HEENT: NCAT, MM dry, no scleral icterus Cardiac: RRR no m/g/r Lungs: Clear bilaterally with the exception of few coarse breath sounds heard anteriorly, no increased WOB, on RA Abdomen: soft, non-distended, normoactive BS, voluntary guarding with palpation of abdomen however states from cold hands, mentions some diffuse soreness with palpation.  No hepatosplenomegaly palpated. Msk: Moves all extremities spontaneously  Ext: Warm, dry, 2+ distal pulses, no edema  Derm: No rash or jaundice noted, no spider angiomas noted Neuro: Alert and oriented x3, speech appropriate, no asterixis   Labs and Imaging:  CBC BMET  Recent Labs  Lab 09/11/18 0330  WBC 5.3  HGB 13.0  HCT 37.7*  PLT 129*   Recent Labs  Lab 09/11/18 0330  NA 131*  K 5.2*  CL 97*  CO2 23  BUN 10  CREATININE 0.68  GLUCOSE 103*  CALCIUM 8.4*     Ct Abdomen Pelvis W Contrast  Result Date: 09/11/2018 CLINICAL DATA:  57 year old male with abdominal pain. EXAM: CT ABDOMEN AND PELVIS WITH CONTRAST TECHNIQUE: Multidetector CT imaging of the abdomen and pelvis was performed using the standard protocol following bolus administration of intravenous contrast. CONTRAST:  181m OMNIPAQUE IOHEXOL 300 MG/ML  SOLN COMPARISON:  CT of the abdomen pelvis dated 05/01/2015 FINDINGS: Lower chest: The visualized lung bases are clear. No intra-abdominal free air or free fluid. Hepatobiliary: There is diffuse fatty infiltration of the liver. No intrahepatic biliary ductal dilatation. The gallbladder is distended otherwise unremarkable. Pancreas: There are coarse  calcifications of the uncinate process of the pancreas, likely sequela of chronic pancreatitis. There is diffuse dilatation of the main pancreatic duct measuring up to 9 mm in diameter. There is a 7 mm stone in the pancreatic duct at the ampulla. No definite active inflammatory changes. Further evaluation with MRI without and with contrast is recommended to exclude an obstructing mass. Spleen: Normal in size without focal abnormality. Adrenals/Urinary Tract: The adrenal glands are unremarkable. There is no hydronephrosis on either side. There is symmetric enhancement and excretion of contrast by both kidneys. A 1 cm exophytic hypodense lesion with layering curvilinear high attenuating content along the inferolateral aspect of the left kidney is not well characterized. Additional subcentimeter bilateral renal hypodense lesions are not characterized. The visualized ureters and urinary bladder appear unremarkable. Stomach/Bowel: There is no bowel obstruction or active inflammation. Normal appendix. Vascular/Lymphatic: The abdominal aorta and IVC appear unremarkable. No portal venous gas. There is no adenopathy. Reproductive: Enlarged prostate gland measuring 4.8 cm in transverse axial diameter. Other: None Musculoskeletal: No acute or significant osseous findings. IMPRESSION: 1. Diffusely dilatation of the main pancreatic duct with moderate atrophy of the gland likely sequela of an obstructing stone at the ampulla. Further evaluation with MRI without and with contrast is recommended to exclude underlying lesion. No definite active inflammatory changes. 2. Fatty liver. 3. No bowel obstruction or active inflammation. Normal appendix. Electronically Signed   By: AAnner CreteM.D.   On: 09/11/2018 06:35    BPatriciaann Clan DO 09/11/2018, 2:39 PM PGY-1, [place] City[place] pager: [phone] text pages welcome  Resident Attestation  I saw and evaluated the patient, performing the key  elements of the service. I personally performed or re-performed the history, physical exam, and medical decision making activities of this service and have verified that the service and findings are accurately documented in the note.I developed the management plan that is described in the note, and I agree with the content, with my edits above in red.   THarolyn Rutherford DO PGY-2, [place]

## 2018-09-11 NOTE — ED Provider Notes (Signed)
[facility] Provider Note   CSN: [id number] Arrival date & time: 09/10/18  2229     History   Chief Complaint Chief Complaint  Patient presents with  . Generalized Body Aches    HPI Samuel [name] is a 56 y.o. male.  The history is provided by the patient. No language interpreter was used.     57 year old male with history of cocaine abuse, alcohol abuse, recurrent pancreatitis presenting complaining of flulike symptoms.  Patient report for the past 2 days he has had subjective fever, chills, body aches, headache, sinus congestion, sneezing, coughing, nausea, vomiting, diarrhea, abdominal discomfort and chest achiness.  He felt that he may have the flu.  He has not had his flu shot.  He denies any recent sick contact.  He has no appetite, he report feeling weak and tired.  Denies any recent travel.  Past Medical History:  Diagnosis Date  . Pancreatitis     Patient Active Problem List   Diagnosis Date Noted  . HYPOKALEMIA 07/28/2010  . TOBACCO ABUSE 07/28/2010  . COCAINE ABUSE 07/28/2010  . NEUROPATHY 07/28/2010  . HYPONATREMIA 07/12/2010  . Alcohol abuse 07/12/2010  . Acute pancreatitis 07/12/2010    Past Surgical History:  Procedure Laterality Date  . NO PAST SURGERIES          Home Medications    Prior to Admission medications   Medication Sig Start Date End Date Taking? Authorizing Provider  albuterol (PROVENTIL HFA;VENTOLIN HFA) 108 (90 Base) MCG/ACT inhaler Inhale 2 puffs into the lungs every 4 (four) hours as needed for wheezing or shortness of breath. 11/24/16   Janne Napoleon, NP  aspirin 325 MG tablet Take 975 mg by mouth every 6 (six) hours as needed for mild pain or headache.    [provider]  diphenoxylate-atropine (LOMOTIL) 2.5-0.025 MG tablet Take 1 tablet by mouth 4 (four) times daily as needed for diarrhea or loose stools. 03/17/17   Recardo Evangelist, PA-C  ibuprofen (ADVIL,MOTRIN) 200 MG tablet Take  200 mg by mouth every 6 (six) hours as needed for mild pain.    [provider]  indomethacin (INDOCIN) 25 MG capsule Take 1 capsule (25 mg total) by mouth 3 (three) times daily as needed. Patient not taking: Reported on 03/17/2017 09/22/15   Antonietta Breach, PA-C  ondansetron (ZOFRAN ODT) 4 MG disintegrating tablet Take 1 tablet (4 mg total) by mouth every 8 (eight) hours as needed for nausea or vomiting. 03/17/17   Recardo Evangelist, PA-C  oxyCODONE-acetaminophen (PERCOCET/ROXICET) 5-325 MG tablet Take 1-2 tablets by mouth every 6 (six) hours as needed for severe pain. 11/21/17   Davonna Belling, MD  promethazine (PHENERGAN) 25 MG tablet Take 1 tablet (25 mg total) by mouth every 6 (six) hours as needed for nausea or vomiting. Patient not taking: Reported on 03/17/2017 10/30/16   Isla Pence, MD    Family History Family History  Problem Relation Age of Onset  . Pancreatitis Neg Hx     Social History Social History   Tobacco Use  . Smoking status: Current Every Day Smoker    Packs/day: 1.00    Types: Cigarettes  . Smokeless tobacco: Current User  Substance Use Topics  . Alcohol use: Yes  . Drug use: No     Allergies   Patient has no known allergies.   Review of Systems Review of Systems  All other systems reviewed and are negative.    Physical Exam Updated Vital Signs  BP 136/90 (BP Location: Left Arm)   Pulse 93   Temp 97.8 F (36.6 C) (Oral)   Resp 14   Ht _0  (1.651 m)   Wt 60.8 kg   SpO2 99%   BMI 22.30 kg/m   Physical Exam  Constitutional: He is oriented to person, place, and time. He appears well-developed and well-nourished. No distress.  HENT:  Head: Atraumatic.  Ears: Normal TMs bilaterally Nose: Normal nares Throat: Uvula midline no tonsillar enlargement or exudate  Eyes: Conjunctivae are normal.  Neck: Normal range of motion. Neck supple.  No nuchal rigidity  Cardiovascular: Normal rate and regular rhythm.  Pulmonary/Chest: Effort  normal and breath sounds normal.  Abdominal: Soft. Bowel sounds are normal. He exhibits no distension. There is no tenderness.  Lymphadenopathy:    He has no cervical adenopathy.  Neurological: He is alert and oriented to person, place, and time.  Skin: No rash noted.  Psychiatric: He has a normal mood and affect.  Nursing note and vitals reviewed.    ED Treatments / Results  Labs (all labs ordered are listed, but only abnormal results are displayed) Labs Reviewed  CBC WITH DIFFERENTIAL/PLATELET - Abnormal; Notable for the following components:      Result Value   RBC 4.14 (*)    HCT 37.7 (*)    Platelets 129 (*)    All other components within normal limits  COMPREHENSIVE METABOLIC PANEL - Abnormal; Notable for the following components:   Sodium 131 (*)    Potassium 5.2 (*)    Chloride 97 (*)    Glucose, Bld 103 (*)    Calcium 8.4 (*)    Albumin 3.0 (*)    AST 699 (*)    ALT 256 (*)    Total Bilirubin 4.0 (*)    All other components within normal limits  LIPASE, BLOOD  INFLUENZA PANEL BY PCR (TYPE A & B)  URINALYSIS, ROUTINE W REFLEX MICROSCOPIC  ACETAMINOPHEN LEVEL  HEPATITIS PANEL, ACUTE  PROTIME-INR    EKG None  Radiology No results found.  Procedures Procedures (including critical care time)  Medications Ordered in ED Medications  sodium chloride 0.9 % bolus 1,000 mL (0 mLs Intravenous Stopped 09/11/18 0351)  oseltamivir (TAMIFLU) capsule 75 mg (75 mg Oral Given 09/11/18 0351)  ondansetron (ZOFRAN) injection 4 mg (4 mg Intravenous Given 09/11/18 0344)  iohexol (OMNIPAQUE) 300 MG/ML solution 100 mL (100 mLs Intravenous Contrast Given 09/11/18 0557)     Initial Impression / Assessment and Plan / ED Course  I have reviewed the triage vital signs and the nursing notes.  Pertinent labs & imaging results that were available during my care of the patient were reviewed by me and considered in my medical decision making (see chart for details).     BP 139/82    Pulse (!) 48   Temp 97.8 F (36.6 C) (Oral)   Resp (!) 22   Ht _1  (1.651 m)   Wt 60.8 kg   SpO2 100%   BMI 22.30 kg/m  Bradycardia, pt is not symptomatic from that stand point.   Final Clinical Impressions(s) / ED Diagnoses   Final diagnoses:  Influenza-like illness  Transaminitis    ED Discharge Orders    None      Patient here with flulike symptoms.  He is still within the window of treatment for influenza.  Will obtain flu PCR.  Since patient also endorsed nausea vomiting diarrhea, basic labs will be checked and IV fluid given.  Lab is remarkable for evidence of transaminitis with AST 6099, ALT 256, total bili of 4, normal alk phos at 125.  Lipase is normal, influenza test is normal, normal WBC and normal H&H.  On reexamination, no significant abdominal tenderness.  Patient does drink alcohol on a regular basis, quantified as 4 beers a day which may contribute to his transaminitis however he does not have evidence of transaminitis from prior lab values.  He will need further evaluation of his condition.  He denies taking Tylenol on a regular basis, and also denies IV drug use.   5:47 AM Appreciate consultation with GI specialist, Dr. Hilarie Fredrickson, who agrees with hepatitis panel, and Tylenol level.  He also recommend checking INR.  If abdominal pelvis CT scan and labs are unremarkable, patient will need close follow-up with GI for further care.  6:40 AM Abd/pelvis CT shows obstructive pancreatic stone, recommend MRI for further evaluation.  Pt sign out to Alferd Apa, PA-C who agrees to consult GI and help determine appropriate disposition.     Domenic Moras, PA-C 09/11/18 3734    Orpah Greek, MD 09/12/18 [phone]

## 2018-09-11 NOTE — Progress Notes (Signed)
CSW acknowledges consult  for PCP. CSW will speak with RNCM to see pt can be established with a PCP.    Claude MangesKierra S. Lonzy Mato, MSW, LCSW-A Emergency Department Clinical Social Worker [phone]

## 2018-09-12 ENCOUNTER — Other Ambulatory Visit: Payer: Self-pay | Admitting: Physician Assistant

## 2018-09-12 ENCOUNTER — Encounter (HOSPITAL_COMMUNITY): Payer: Self-pay | Admitting: Emergency Medicine

## 2018-09-12 ENCOUNTER — Encounter: Payer: Self-pay | Admitting: Nurse Practitioner

## 2018-09-12 DIAGNOSIS — R945 Abnormal results of liver function studies: Secondary | ICD-10-CM

## 2018-09-12 DIAGNOSIS — R740 Nonspecific elevation of levels of transaminase and lactic acid dehydrogenase [LDH]: Secondary | ICD-10-CM | POA: Diagnosis not present

## 2018-09-12 DIAGNOSIS — D649 Anemia, unspecified: Secondary | ICD-10-CM

## 2018-09-12 DIAGNOSIS — R7989 Other specified abnormal findings of blood chemistry: Secondary | ICD-10-CM

## 2018-09-12 LAB — COMPREHENSIVE METABOLIC PANEL
ALBUMIN: 2.3 g/dL — AB (ref 3.5–5.0)
ALT: 154 U/L — ABNORMAL HIGH (ref 0–44)
AST: 317 U/L — ABNORMAL HIGH (ref 15–41)
Alkaline Phosphatase: 172 U/L — ABNORMAL HIGH (ref 38–126)
Anion gap: 7 (ref 5–15)
BUN: <5 mg/dL — ABNORMAL LOW (ref 6–20)
CO2: 28 mmol/L (ref 22–32)
Calcium: 8.2 mg/dL — ABNORMAL LOW (ref 8.9–10.3)
Chloride: 100 mmol/L (ref 98–111)
Creatinine, Ser: 0.76 mg/dL (ref 0.61–1.24)
GFR calc Af Amer: >60 mL/min (ref >60)
GFR calc non Af Amer: >60 mL/min (ref >60)
Glucose, Bld: 139 mg/dL — ABNORMAL HIGH (ref 70–99)
Potassium: 3.7 mmol/L (ref 3.5–5.1)
Sodium: 135 mmol/L (ref 135–145)
Total Bilirubin: 3.4 mg/dL — ABNORMAL HIGH (ref 0.3–1.2)
Total Protein: 5.7 g/dL — ABNORMAL LOW (ref 6.5–8.1)

## 2018-09-12 LAB — HIV ANTIBODY (ROUTINE TESTING W REFLEX): HIV Screen 4th Generation wRfx: NONREACTIVE

## 2018-09-12 LAB — CBC WITH DIFFERENTIAL/PLATELET
Abs Immature Granulocytes: 0.01 10*3/uL (ref 0.00–0.07)
Basophils Absolute: 0 10*3/uL (ref 0.0–0.1)
Basophils Relative: 1 %
Eosinophils Absolute: 0.1 10*3/uL (ref 0.0–0.5)
Eosinophils Relative: 1 %
HCT: 29.1 % — ABNORMAL LOW (ref 39.0–52.0)
HEMOGLOBIN: 10.1 g/dL — AB (ref 13.0–17.0)
Immature Granulocytes: 0 %
Lymphocytes Relative: 31 %
Lymphs Abs: 1.6 10*3/uL (ref 0.7–4.0)
MCH: 31.6 pg (ref 26.0–34.0)
MCHC: 34.7 g/dL (ref 30.0–36.0)
MCV: 90.9 fL (ref 80.0–100.0)
MONO ABS: 0.5 10*3/uL (ref 0.1–1.0)
Monocytes Relative: 9 %
Neutro Abs: 3 10*3/uL (ref 1.7–7.7)
Neutrophils Relative %: 58 %
Platelets: 74 10*3/uL — ABNORMAL LOW (ref 150–400)
RBC: 3.2 MIL/uL — AB (ref 4.22–5.81)
RDW: 14.9 % (ref 11.5–15.5)
WBC: 5.1 10*3/uL (ref 4.0–10.5)
nRBC: 0 % (ref 0.0–0.2)

## 2018-09-12 LAB — HEPATITIS PANEL, ACUTE
HCV Ab: <0.1 s/co ratio (ref 0.0–0.9)
HEP B C IGM: NEGATIVE
Hep A IgM: NEGATIVE
Hepatitis B Surface Ag: NEGATIVE

## 2018-09-12 LAB — LIPASE, BLOOD: Lipase: 20 U/L (ref 11–51)

## 2018-09-12 MED ORDER — PANCRELIPASE (LIP-PROT-AMYL) 24000-76000 UNITS PO CPEP: 24000.0000 [IU] | ORAL_CAPSULE | Freq: Three times a day (TID) | ORAL | 0 refills | Status: DC

## 2018-09-12 NOTE — Discharge Summary (Signed)
Family Medicine Teaching [facility] Discharge Summary  Patient name: Samuel [name] Medical record number: [id number] Date of birth: [birth year] Age: 57 y.o. Gender: male Date of Admission: 09/11/2018  Date of Discharge: 09/12/2018 Admitting Physician: Moses Manners, MD  Primary Care Provider: Patient, No Pcp Per Consultants: GI   Indication for Hospitalization: Transaminitis  Discharge Diagnoses/Problem List:  Transaminitis, improving Pancreatic duct stone Chronic thrombocytopenia Productive cough, ?COPD Hypokalemia, resolved Hyponatremia, resolved Chronic pancreatitis, in the setting of longstanding alcohol use Alcohol abuse Previous cocaine abuse, reported in problem list only  Disposition: Home  Discharge Condition: Stable  Discharge Exam: General: Alert, NAD HEENT: NCAT, MMM, oropharynx nonerythematous  Cardiac: RRR no m/g/r Lungs: Clear bilaterally, no increased WOB on room air Abdomen: soft, some tenderness to his abdomen, mainly in the right upper quadrant and right lower quadrant.  No rebound tenderness. non-distended, normoactive BS Msk: Moves all extremities spontaneously  Ext: Warm, dry, 2+ distal pulses, no edema   Brief Hospital Course:  Mr. Reynaga is a 57 year old gentleman with a history of alcohol abuse and chronic pancreatitis that presented with a few day history of generalized malaise, subjective fever, and poor appetite that was ultimately found to have a transaminitis of AST 699, ALT 256 and a 7 mm pancreatic duct stone in the ampulla.  GI was consulted, recommended no surgical intervention at this time for the duct stone (felt it was incidental and not related to transaminitis) and close monitoring of LFTs, suspected combination of alcohol injury and unidentified toxins precipitating inflammation. Lipase 20.  Influenza negative. Tylenol level<10, acute hepatitis panel negative.  Following fluid rehydration, he endorsed resolution of his malaise and  poor appetite.  At discharge, he was hemodynamically stable with an AST at 317, ALT 154, bilirubin 3.4.  He will follow-up closely with GI outpatient.  His remaining medical conditions were well managed.  Issues for Follow Up:  1. Please follow-up with a CMP to monitor LFTs, bilirubin, alk phos.  2. Also obtain a CBC on follow-up, platelets 74 at discharge.  Appears to have a chronic thrombocytopenia likely from bone marrow suppression with alcoholism, however would monitor this closely. 3. Please ensure he follows up with GI for these concerns, has appointment 12/18.  Started on Creon due to his chronic pancreatitis and history of consistent diarrhea. 4.   Productive cough: Endorse this for few weeks, with extensive pack-year history.  Some minor       coarse breath sounds on admit. CXR without acute pulmonary disease.  Consider further       evaluation for COPD if appropriate.   Significant Procedures: None   Significant Labs and Imaging:  Recent Labs  Lab 09/11/18 1603 09/12/18 0527 09/13/18 1936  WBC 4.9 5.1 5.2  HGB 12.0* 10.1* 11.1*  HCT 34.6* 29.1* 32.6*  PLT 87* 74* 62*   Recent Labs  Lab 09/11/18 0330 09/11/18 1603 09/12/18 0527 09/13/18 1936  NA 131*  --  135 133*  K 5.2*  --  3.7 3.4*  CL 97*  --  100 98  CO2 23  --  28 27  GLUCOSE 103*  --  139* 157*  BUN 10  --  <5* <5*  CREATININE 0.68 0.84 0.76 0.63  CALCIUM 8.4*  --  8.2* 8.6*  ALKPHOS 125  --  172* 132*  AST 699*  --  317* 162*  ALT 256*  --  154* 117*  ALBUMIN 3.0*  --  2.3* 2.9*    Results/Tests Pending at  Time of Discharge: None   Discharge Medications:  Allergies as of 09/12/2018   No Known Allergies     Medication List    STOP taking these medications   diphenoxylate-atropine 2.5-0.025 MG tablet Commonly known as:  LOMOTIL   ondansetron 4 MG disintegrating tablet Commonly known as:  ZOFRAN-ODT   oxyCODONE-acetaminophen 5-325 MG tablet Commonly known as:  PERCOCET/ROXICET    promethazine 25 MG tablet Commonly known as:  PHENERGAN     TAKE these medications   albuterol 108 (90 Base) MCG/ACT inhaler Commonly known as:  PROVENTIL HFA;VENTOLIN HFA Inhale 2 puffs into the lungs every 4 (four) hours as needed for wheezing or shortness of breath.   aspirin 325 MG tablet Take 325 mg by mouth every 6 (six) hours as needed for mild pain or headache.   indomethacin 25 MG capsule Commonly known as:  INDOCIN Take 1 capsule (25 mg total) by mouth 3 (three) times daily as needed.   Pancrelipase (Lip-Prot-Amyl) 24000-76000 units Cpep Take 1 capsule (24,000 Units total) by mouth 3 (three) times daily before meals.       Discharge Instructions: Please refer to Patient Instructions section of EMR for full details.  Patient was counseled important signs and symptoms that should prompt return to medical care, changes in medications, dietary instructions, activity restrictions, and follow up appointments.   Follow-Up Appointments: Follow-up Information    Meredith Pel, NP Follow up on 09/26/2018.   Specialty:  Gastroenterology Why:  9 AM.  follow up with PA regarding liver tests.   Contact information: [address] [place] [address] [phone]        [facility]. Go on 09/24/2018.   Why:  go to lab anytime the previous week or on 12/15 or 12/16 for blood draw.   Hours are 730AM to 530PM Contact information: [address] [place] [address]       [facility]. Go on 09/20/2018.   Why:  at 10:30 am for your hospital follow-up appointment Contact information: [address] [place] [address]          Allayne Stack, [place] 09/14/2018, 5:32 PM PGY-1, [facility]

## 2018-09-12 NOTE — Discharge Instructions (Signed)
You were admitted to [name] for further evaluation of your liver function and your general tiredness.  Following admission, you were given plenty of fluids that helped you feel back to yourself.  You were negative for the flu.  Please make sure you keep yourself hydrated and eat well while you are at home.  Please continue working towards cutting back your alcoholic drinking, your beers nightly.  This is going to help your liver stay healthy.  Please make sure you follow-up with the stomach doctors for further monitoring of your liver function and your new primary care doctor for hospital follow-up.  For your continued cough, you may use cough suppressant over-the-counter.

## 2018-09-12 NOTE — Progress Notes (Addendum)
Daily Rounding Note  09/12/2018, 9:24 AM  LOS: 0 days   SUBJECTIVE:   Chief complaint: hepatitis.  cough    Fitful sleep due to cough.  No abd pain.  + loose stools.  No N/V, eating well of solid food  OBJECTIVE:         Vital signs in last 24 hours:    Temp:  [97.4 F (36.3 C)-98.2 F (36.8 C)] 97.8 F (36.6 C) (12/04 0628) Pulse Rate:  [46-131] 58 (12/04 0628) Resp:  [14-20] 14 (12/04 0628) BP: (93-132)/(60-81) 114/67 (12/04 0628) SpO2:  [95 %-100 %] 99 % (12/04 0628)   Filed Weights   09/10/18 2236  Weight: 60.8 kg   General: looks well, comfortable, NAD   Heart: RRR Chest: clear but reduced BS at bases.  No dyspnea or cough Abdomen: soft, NT, ND.  No mass or HSM  Extremities: no CCE. Neuro/Psych:  Oriented x 3.  No gross tremor, weakness, deficits.  Very pleasant.    Intake/Output from previous day: 12/03 0701 - 12/04 0700 In: 1015.8 [I.V.:1015.8] Out: 350 [Urine:350]  Intake/Output this shift: No intake/output data recorded.  Lab Results: Recent Labs    09/11/18 0330 09/11/18 1603 09/12/18 0527  WBC 5.3 4.9 5.1  HGB 13.0 12.0* 10.1*  HCT 37.7* 34.6* 29.1*  PLT 129* 87* 74*   BMET Recent Labs    09/11/18 0330 09/11/18 1603 09/12/18 0527  NA 131*  --  135  K 5.2*  --  3.7  CL 97*  --  100  CO2 23  --  28  GLUCOSE 103*  --  139*  BUN 10  --  <5*  CREATININE 0.68 0.84 0.76  CALCIUM 8.4*  --  8.2*   LFT Recent Labs    09/11/18 0330 09/12/18 0527  PROT 6.6 5.7*  ALBUMIN 3.0* 2.3*  AST 699* 317*  ALT 256* 154*  ALKPHOS 125 172*  BILITOT 4.0* 3.4*   PT/INR Recent Labs    09/11/18 0800  LABPROT 16.6*  INR 1.36   Hepatitis Panel Recent Labs    09/11/18 0537  HEPBSAG Negative  HCVAB <0.1  HEPAIGM Negative  HEPBIGM Negative    Studies/Results: Dg Chest 2 View  Result Date: 09/11/2018 CLINICAL DATA:  Cough. EXAM: CHEST - 2 VIEW COMPARISON:  Radiographs of November 21, 2017. FINDINGS: The heart size and mediastinal contours are within normal limits. Both lungs are clear. The visualized skeletal structures are unremarkable. IMPRESSION: No active cardiopulmonary disease. Electronically Signed   By: Lupita RaiderJames  Green Jr, M.D.   On: 09/11/2018 17:29   Ct Abdomen Pelvis W Contrast  Result Date: 09/11/2018 CLINICAL DATA:  57 year old male with abdominal pain. EXAM: CT ABDOMEN AND PELVIS WITH CONTRAST TECHNIQUE: Multidetector CT imaging of the abdomen and pelvis was performed using the standard protocol following bolus administration of intravenous contrast. CONTRAST:  100mL OMNIPAQUE IOHEXOL 300 MG/ML  SOLN COMPARISON:  CT of the abdomen pelvis dated 05/01/2015 FINDINGS: Lower chest: The visualized lung bases are clear. No intra-abdominal free air or free fluid. Hepatobiliary: There is diffuse fatty infiltration of the liver. No intrahepatic biliary ductal dilatation. The gallbladder is distended otherwise unremarkable. Pancreas: There are coarse calcifications of the uncinate process of the pancreas, likely sequela of chronic pancreatitis. There is diffuse dilatation of the main pancreatic duct measuring up to 9 mm in diameter. There is a 7 mm stone in the pancreatic duct at the ampulla. No definite active inflammatory  changes. Further evaluation with MRI without and with contrast is recommended to exclude an obstructing mass. Spleen: Normal in size without focal abnormality. Adrenals/Urinary Tract: The adrenal glands are unremarkable. There is no hydronephrosis on either side. There is symmetric enhancement and excretion of contrast by both kidneys. A 1 cm exophytic hypodense lesion with layering curvilinear high attenuating content along the inferolateral aspect of the left kidney is not well characterized. Additional subcentimeter bilateral renal hypodense lesions are not characterized. The visualized ureters and urinary bladder appear unremarkable. Stomach/Bowel: There is no bowel  obstruction or active inflammation. Normal appendix. Vascular/Lymphatic: The abdominal aorta and IVC appear unremarkable. No portal venous gas. There is no adenopathy. Reproductive: Enlarged prostate gland measuring 4.8 cm in transverse axial diameter. Other: None Musculoskeletal: No acute or significant osseous findings. IMPRESSION: 1. Diffusely dilatation of the main pancreatic duct with moderate atrophy of the gland likely sequela of an obstructing stone at the ampulla. Further evaluation with MRI without and with contrast is recommended to exclude underlying lesion. No definite active inflammatory changes. 2. Fatty liver. 3. No bowel obstruction or active inflammation. Normal appendix. Electronically Signed   By: Elgie Collard M.D.   On: 09/11/2018 06:35   Scheduled Meds: . enoxaparin (LOVENOX) injection  40 mg Subcutaneous Q24H  . folic acid  1 mg Oral Daily  . Influenza vac split quadrivalent PF  0.5 mL Intramuscular Tomorrow-1000  . lipase/protease/amylase  24,000 Units Oral TID AC  . thiamine  100 mg Oral Daily   Continuous Infusions: . sodium chloride 100 mL/hr at 09/12/18 0357   PRN Meds:.albuterol, ondansetron **OR** ondansetron (ZOFRAN) IV   ASSESMENT:   *   Elevated LFTs.  Due to ETOH and possibly DILI from OTC cold remedy.  LFTs improved.  Fatty liver, no cirrhosis on CT.   Coags fine.    *   Pancreatic duct stone, chronic calcific pancreatitis from ETOH abuse.  Asymptomatic.  *  Loose stools, non-bloody.  Creon added for suspicion of pancreatic enzyme deficiency related malabsorption, 2 doses so far.    *   URI, tested negative for flu.    *   Normocytic anemia.    *   Thrombocytopenia.       PLAN   *  Ok for discharge. He has ROV with NP at GI office on 12/18 and previsit lab (hepatic fx profile and CBC) work ordered within a week of the visit  *   Stop all ETOH for ever.  dw the patient.    *  Continue Rx with Creon 24,000 units AC and see if it helps the  diarrhea.    *   Needs to establish with a PMD so he can avoid visits to ED for routiine and non-emergency urgent care.    Jennye Moccasin  09/12/2018, 9:24 AM Phone [phone]  GI ATTENDING  Interval history and data reviewed.  Patient seen and examined.  No evidence for acute viral hepatitis.  Suspect combination of acute illness, alcohol, and drug.  Liver tests improving.  These should be trended.  My physician assistant has made arrangements for office follow-up with one of our GI advanced practitioners.  Patient understands the importance of stopping all alcohol, avoiding over-the-counter agents for the time being, and following up.  Wilhemina Bonito. Eda Keys., M.D. [facility] Division of Gastroenterology

## 2018-09-12 NOTE — Care Management Note (Signed)
Case Management Note  Patient Details  Name: CLERENCE GUBSER MRN: [id number] Date of Birth: April 04, 1961  Subjective/Objective: 57 yo male presented with malaise, cough. PMH: ETOH use, previous cocaine use, and chronic/recurrent pancreatitis.  Action/Plan: CM consult acknowledged. CM met with patient to discuss transitional needs. Patient lives at home, independent with ADLs PTA. Patient reported having no established PCP, but agreeable to assistance by CM. Hospital f/u appointment arranged at: [facility] on 09/20/18 @ 1030; AVS updated. Patient has active [employer] to cover pharmacy needs. No further needs from CM.  Expected Discharge Date:                  Expected Discharge Plan:  Home/Self Care  In-House Referral:  NA  Discharge planning Services  CM Consult, Follow-up appt scheduled  Post Acute Care Choice:  NA Choice offered to:  NA  DME Arranged:  N/A DME Agency:  NA  HH Arranged:  NA HH Agency:  NA  Status of Service:  In process, will continue to follow  If discussed at Long Length of Stay Meetings, dates discussed:    Additional Comments:  Midge Minium RN, BSN, NCM-BC, ACM-RN [phone] 09/12/2018, 11:10 AM

## 2018-09-12 NOTE — Progress Notes (Signed)
Family Medicine Teaching Service Daily Progress Note Intern Pager: [phone]  Patient name: Samuel [name] Medical record number: [id number] Date of birth: 05-25-1961 Age: 57 y.o. Gender: male  Primary Care Provider: Patient, No Pcp Per Consultants: GI Code Status: Full  Assessment and Plan: Samuel [name] is a 57 y.o. male presenting with a few day history of malaise, subjective fever, and found to have a transaminitis and a 46m pancreatitic stone. PMH is significant for alcohol use, previous cocaine use, and chronic/recurrent pancreatitis.   Transaminitis  In setting of alcohol use and PD stone: Unknown chronicity. Feeling much better today.  Continues to have voluntary guarding and soreness mainly in his RUQ. AST 317 from 699, and ALT 154 from 256.  Alk phos 172 this a.m., previously W and L.  Bili 3.4, from 4.0.  UDS negative.  Acute hepatitis panel negative.  Differential continues to include intermittent hepatobiliary obstruction due to stone, vs alcohol hepatitis, vs unidentified toxin via OTC medications.  CT abdomen obtained on 12/30 recommending MRI for further exclusion of an obstructing mass. -GI on board, appreciate further recommendations of care, will discuss necessity for ?MRI abdomen  - Monitor CMP and CBC - Continue IVF NS 100 mL/hour -Vitals per routine - Continue monitoring for signs of alcohol withdrawal as below - Case management on board for PCP  Pancreatic Duct Stone: 7 mm stone within the ampulla of the main pancreatic duct.  Coarse calcifications noted on the pancreas consistent with chronic pancreatitis.  Question intermittent obstruction as above.  -GI on board, appreciate recommendations -Obtain lipase this a.m. -CMP and CBC -Monitor symptoms  Thrombocytopenia: Acute on Chronic.  Platelets 74, from 129 on admit. Intermittently low in the past.  Chronically low likely from alcoholism, however question new toxin causing a acute drop vs hemodilution with  fluid resuscitation (this is most likely).  HIV and acute hepatitis negative.  -Monitor CBC, already verified via smear  Productive Cough: Subacute, stable.  Continues to have minor cough without any increased WOB.  Breathing well on RA.  Lungs are clear on exam.  CXR without any acute cardiopulmonary disease.  Continue to consider URI VS bronchitis VS undiagnosed COPD.  -Continue to monitor symptoms -Consider PFTs outpatient - Albuterol neb every 4 PRN  Hyperkalemia: Resolved K3.7 this a.m. -Monitor BMP  Hyponatremia: Resolved NA 135 this a.m. -Daily BMP  Chronic pancreatitis, in setting of long-standing alcohol use:  -Started on Creon per GI -Monitor BM  Alcohol abuse: Chronic. Reports drinking 3 beers daily.  -CIWA protocol -Provide folate and thiamine  Cocaine abuse: Reported on problem list, however patient denies any current or previous use of cocaine. -UDS negative  FEN/GI: Regular diet, IV fluids Prophylaxis: Lovenox  Disposition: Continued care with GI evaluation, likely d/c home with follow up   Subjective:  Doing well this morning, no longer feels tired or fatigued.  However he does continue to have a cough, nonproductive this morning.  Eating and drinking well without concern.  Denying any nausea, vomiting, or abdominal pain.  Objective: Temp:  [97.4 F (36.3 C)-98.2 F (36.8 C)] 97.8 F (36.6 C) (12/04 0628) Pulse Rate:  [44-131] 58 (12/04 0628) Resp:  [14-22] 14 (12/04 0628) BP: (93-142)/(60-97) 114/67 (12/04 0628) SpO2:  [95 %-100 %] 99 % (12/04 01027 Physical Exam: General: Alert, NAD HEENT: NCAT, MMM Cardiac: RRR no m/g/r Lungs: Clear bilaterally, no increased WOB  Abdomen: soft, normoactive BS, tenderness to the RUQ and RLQ with voluntary guarding, states it is sore with  palpation.  No hepato-splenomegaly detected. Msk: Moves all extremities spontaneously  Ext: Warm, dry, 2+ distal pulses, no edema   Laboratory: Recent Labs  Lab  09/11/18 0330 09/11/18 1603  WBC 5.3 4.9  HGB 13.0 12.0*  HCT 37.7* 34.6*  PLT 129* 87*   Recent Labs  Lab 09/11/18 0330 09/11/18 1603  NA 131*  --   K 5.2*  --   CL 97*  --   CO2 23  --   BUN 10  --   CREATININE 0.68 0.84  CALCIUM 8.4*  --   PROT 6.6  --   BILITOT 4.0*  --   ALKPHOS 125  --   ALT 256*  --   AST 699*  --   GLUCOSE 103*  --     Imaging/Diagnostic Tests: Dg Chest 2 View  Result Date: 09/11/2018 CLINICAL DATA:  Cough. EXAM: CHEST - 2 VIEW COMPARISON:  Radiographs of November 21, 2017. FINDINGS: The heart size and mediastinal contours are within normal limits. Both lungs are clear. The visualized skeletal structures are unremarkable. IMPRESSION: No active cardiopulmonary disease. Electronically Signed   By: Marijo Conception, M.D.   On: 09/11/2018 17:29      Patriciaann Clan, DO 09/12/2018, 6:48 AM PGY-1, [facility] Intern pager: [phone], text pages welcome

## 2018-09-13 ENCOUNTER — Emergency Department (HOSPITAL_COMMUNITY)
Admission: EM | Admit: 2018-09-13 | Discharge: 2018-09-13 | Disposition: A | Discharge status: Home / Self Care | Payer: BLUE CROSS/BLUE SHIELD | Attending: Emergency Medicine | Admitting: Emergency Medicine

## 2018-09-13 ENCOUNTER — Encounter (HOSPITAL_COMMUNITY): Payer: Self-pay | Admitting: *Deleted

## 2018-09-13 ENCOUNTER — Emergency Department (HOSPITAL_COMMUNITY): Payer: BLUE CROSS/BLUE SHIELD

## 2018-09-13 DIAGNOSIS — R569 Unspecified convulsions: Secondary | ICD-10-CM

## 2018-09-13 DIAGNOSIS — F17210 Nicotine dependence, cigarettes, uncomplicated: Secondary | ICD-10-CM | POA: Insufficient documentation

## 2018-09-13 LAB — COMPREHENSIVE METABOLIC PANEL
ALT: 117 U/L — ABNORMAL HIGH (ref 0–44)
AST: 162 U/L — ABNORMAL HIGH (ref 15–41)
Albumin: 2.9 g/dL — ABNORMAL LOW (ref 3.5–5.0)
Alkaline Phosphatase: 132 U/L — ABNORMAL HIGH (ref 38–126)
Anion gap: 8 (ref 5–15)
BUN: <5 mg/dL — ABNORMAL LOW (ref 6–20)
CALCIUM: 8.6 mg/dL — AB (ref 8.9–10.3)
CO2: 27 mmol/L (ref 22–32)
Chloride: 98 mmol/L (ref 98–111)
Creatinine, Ser: 0.63 mg/dL (ref 0.61–1.24)
GFR calc non Af Amer: >60 mL/min (ref >60)
Glucose, Bld: 157 mg/dL — ABNORMAL HIGH (ref 70–99)
Potassium: 3.4 mmol/L — ABNORMAL LOW (ref 3.5–5.1)
SODIUM: 133 mmol/L — AB (ref 135–145)
TOTAL PROTEIN: 6.5 g/dL (ref 6.5–8.1)
Total Bilirubin: 2.9 mg/dL — ABNORMAL HIGH (ref 0.3–1.2)

## 2018-09-13 LAB — CBC
HCT: 32.6 % — ABNORMAL LOW (ref 39.0–52.0)
Hemoglobin: 11.1 g/dL — ABNORMAL LOW (ref 13.0–17.0)
MCH: 31.8 pg (ref 26.0–34.0)
MCHC: 34 g/dL (ref 30.0–36.0)
MCV: 93.4 fL (ref 80.0–100.0)
PLATELETS: 62 10*3/uL — AB (ref 150–400)
RBC: 3.49 MIL/uL — ABNORMAL LOW (ref 4.22–5.81)
RDW: 15.3 % (ref 11.5–15.5)
WBC: 5.2 10*3/uL (ref 4.0–10.5)
nRBC: 0 % (ref 0.0–0.2)

## 2018-09-13 LAB — ETHANOL: Alcohol, Ethyl (B): <10 mg/dL (ref <10)

## 2018-09-13 LAB — LIPASE, BLOOD: Lipase: 19 U/L (ref 11–51)

## 2018-09-13 MED ORDER — LORAZEPAM 1 MG PO TABS: 1.0000 mg | ORAL_TABLET | Freq: Once | ORAL | Status: DC

## 2018-09-13 NOTE — ED Notes (Signed)
ED Provider at bedside. 

## 2018-09-13 NOTE — ED Notes (Signed)
Patient verbalizes understanding of discharge instructions. Opportunity for questioning and answers were provided. Armband removed by staff, pt discharged from ED.  

## 2018-09-13 NOTE — ED Triage Notes (Signed)
Per EMS- pt was at work on ground smoking. Pt was witnessed to have a seizure by coworkers. Pt was reported to fall and hit right side of head. postictal. Pt was more responsive upon EMS arrival. Unsure of past seizures. Recent hospital stay. Pt was able to stand and no neuro deficits with EMS

## 2018-09-14 NOTE — ED Provider Notes (Signed)
[facility] Provider Note   CSN: [id number] Arrival date & time: 09/13/18  1848     History   Chief Complaint Chief Complaint  Patient presents with  . Seizures    HPI Samuel [name] is a 57 y.o. male.  HPI 57 year old male with no prior history of seizure disorders presents after seizure-like activity while at work.  He was witnessed by coworkers to have a tonic-clonic seizure.  He reportedly hit his head.  He was postictal for EMS.  Patient reports no tongue biting or urinary incontinence.  No significant headache at this time.  Baseline mental status per family.  Family is concerned that this could be secondary to alcohol withdrawal.  Patient was recently hospitalized in before that he was a daily drinker.  He has not had alcohol since his hospitalization.   Past Medical History:  Diagnosis Date  . Pancreatitis     Patient Active Problem List   Diagnosis Date Noted  . Transaminitis 09/11/2018  . HYPOKALEMIA 07/28/2010  . TOBACCO ABUSE 07/28/2010  . COCAINE ABUSE 07/28/2010  . NEUROPATHY 07/28/2010  . HYPONATREMIA 07/12/2010  . Alcohol abuse 07/12/2010  . Acute pancreatitis 07/12/2010    Past Surgical History:  Procedure Laterality Date  . NO PAST SURGERIES          Home Medications    Prior to Admission medications   Medication Sig Start Date End Date Taking? Authorizing Provider  aspirin 325 MG tablet Take 325 mg by mouth every 6 (six) hours as needed for mild pain or headache.    Yes [provider]  lipase/protease/amylase 24000-76000 units CPEP Take 1 capsule (24,000 Units total) by mouth 3 (three) times daily before meals. 09/12/18  Yes Beard, Samantha N, DO  albuterol (PROVENTIL HFA;VENTOLIN HFA) 108 (90 Base) MCG/ACT inhaler Inhale 2 puffs into the lungs every 4 (four) hours as needed for wheezing or shortness of breath. Patient not taking: Reported on 09/13/2018 11/24/16   Hayden RasmussenMabe, David, NP  indomethacin  (INDOCIN) 25 MG capsule Take 1 capsule (25 mg total) by mouth 3 (three) times daily as needed. Patient not taking: Reported on 03/17/2017 09/22/15   Antony MaduraHumes, Kelly, PA-C    Family History Family History  Problem Relation Age of Onset  . Pancreatitis Neg Hx     Social History Social History   Tobacco Use  . Smoking status: Current Every Day Smoker    Packs/day: 1.00    Years: 45.00    Pack years: 45.00    Types: Cigarettes  . Smokeless tobacco: Current User  Substance Use Topics  . Alcohol use: Yes    Alcohol/week: 3.0 standard drinks    Types: 3 Cans of beer per week    Comment: DAILY  . Drug use: Not Currently     Allergies   Patient has no known allergies.   Review of Systems Review of Systems  All other systems reviewed and are negative.    Physical Exam Updated Vital Signs BP 129/72   Pulse (!) 53   Temp 98.3 F (36.8 C) (Oral)   Resp 16   Ht 5\' 5"  (1.651 m)   Wt 65.8 kg   SpO2 99%   BMI 24.13 kg/m   Physical Exam  Constitutional: He is oriented to person, place, and time. He appears well-developed and well-nourished.  HENT:  Head: Normocephalic and atraumatic.  Eyes: Pupils are equal, round, and reactive to light. EOM are normal.  Neck: Normal range of motion.  Cardiovascular: Normal rate, regular rhythm, normal heart sounds and intact distal pulses.  Pulmonary/Chest: Effort normal and breath sounds normal. No respiratory distress.  Abdominal: Soft. He exhibits no distension. There is no tenderness.  Musculoskeletal: Normal range of motion.  Neurological: He is alert and oriented to person, place, and time.  5/5 strength in major muscle groups of  bilateral upper and lower extremities. Speech normal. No facial asymetry.   Skin: Skin is warm and dry.  Psychiatric: He has a normal mood and affect. Judgment normal.  Nursing note and vitals reviewed.    ED Treatments / Results  Labs (all labs ordered are listed, but only abnormal results are  displayed) Labs Reviewed  CBC - Abnormal; Notable for the following components:      Result Value   RBC 3.49 (*)    Hemoglobin 11.1 (*)    HCT 32.6 (*)    Platelets 62 (*)    All other components within normal limits  COMPREHENSIVE METABOLIC PANEL - Abnormal; Notable for the following components:   Sodium 133 (*)    Potassium 3.4 (*)    Glucose, Bld 157 (*)    BUN <5 (*)    Calcium 8.6 (*)    Albumin 2.9 (*)    AST 162 (*)    ALT 117 (*)    Alkaline Phosphatase 132 (*)    Total Bilirubin 2.9 (*)    All other components within normal limits  ETHANOL  LIPASE, BLOOD    EKG EKG Interpretation  Date/Time:  Thursday September 13 2018 19:15:02 EST Ventricular Rate:  62 PR Interval:    QRS Duration: 95 QT Interval:  414 QTC Calculation: 421 R Axis:   -41 Text Interpretation:  Sinus rhythm Left axis deviation Anterior infarct, old No significant change was found Confirmed by Azalia Bilis ([id number]) on 09/13/2018 10:27:54 PM   Radiology Ct Head Wo Contrast  Result Date: 09/13/2018 CLINICAL DATA:  Seizure EXAM: CT HEAD WITHOUT CONTRAST TECHNIQUE: Contiguous axial images were obtained from the base of the skull through the vertex without intravenous contrast. COMPARISON:  05/24/2014 FINDINGS: Brain: No acute intracranial abnormality. Specifically, no hemorrhage, hydrocephalus, mass lesion, acute infarction, or significant intracranial injury. Vascular: No hyperdense vessel or unexpected calcification. Skull: No acute calvarial abnormality. Sinuses/Orbits: Visualized paranasal sinuses and mastoids clear. Orbital soft tissues unremarkable. Other: None IMPRESSION: No acute intracranial abnormality. Electronically Signed   By: Charlett Nose M.D.   On: 09/13/2018 19:42    Procedures Procedures (including critical care time)  Medications Ordered in ED Medications - No data to display   Initial Impression / Assessment and Plan / ED Course  I have reviewed the triage vital signs and the  nursing notes.  Pertinent labs & imaging results that were available during my care of the patient were reviewed by me and considered in my medical decision making (see chart for details).     Work-up in the emergency department is without significant ab normality.  He is ambulatory.  He feels good at this time.  Standard seizure precautions given.  Outpatient neurology follow-up.  Final Clinical Impressions(s) / ED Diagnoses   Final diagnoses:  Seizure [facility])    ED Discharge Orders    None       Azalia Bilis, MD 09/14/18 0104

## 2018-09-20 ENCOUNTER — Ambulatory Visit: Payer: BLUE CROSS/BLUE SHIELD | Admitting: Family Medicine

## 2018-09-26 ENCOUNTER — Ambulatory Visit: Payer: BLUE CROSS/BLUE SHIELD | Admitting: Nurse Practitioner

## 2018-10-04 ENCOUNTER — Ambulatory Visit (INDEPENDENT_AMBULATORY_CARE_PROVIDER_SITE_OTHER): Payer: BLUE CROSS/BLUE SHIELD | Admitting: Family Medicine

## 2018-10-04 ENCOUNTER — Encounter: Payer: Self-pay | Admitting: Family Medicine

## 2018-10-04 VITALS — BP 123/85 | HR 91 | Resp 17 | Ht 65.0 in | Wt 114.4 lb

## 2018-10-04 DIAGNOSIS — Z7689 Persons encountering health services in other specified circumstances: Secondary | ICD-10-CM

## 2018-10-04 DIAGNOSIS — F17210 Nicotine dependence, cigarettes, uncomplicated: Secondary | ICD-10-CM

## 2018-10-04 DIAGNOSIS — R740 Nonspecific elevation of levels of transaminase and lactic acid dehydrogenase [LDH]: Secondary | ICD-10-CM | POA: Diagnosis not present

## 2018-10-04 DIAGNOSIS — Z13220 Encounter for screening for lipoid disorders: Secondary | ICD-10-CM

## 2018-10-04 DIAGNOSIS — E876 Hypokalemia: Secondary | ICD-10-CM

## 2018-10-04 DIAGNOSIS — R748 Abnormal levels of other serum enzymes: Secondary | ICD-10-CM

## 2018-10-04 DIAGNOSIS — R7401 Elevation of levels of liver transaminase levels: Secondary | ICD-10-CM

## 2018-10-04 DIAGNOSIS — Z1329 Encounter for screening for other suspected endocrine disorder: Secondary | ICD-10-CM

## 2018-10-04 DIAGNOSIS — F1010 Alcohol abuse, uncomplicated: Secondary | ICD-10-CM | POA: Diagnosis not present

## 2018-10-04 DIAGNOSIS — Z131 Encounter for screening for diabetes mellitus: Secondary | ICD-10-CM

## 2018-10-04 MED ORDER — ALBUTEROL SULFATE 108 (90 BASE) MCG/ACT IN AEPB: 2.0000 | INHALATION_SPRAY | RESPIRATORY_TRACT | 1 refills | Status: DC | PRN

## 2018-10-04 NOTE — Patient Instructions (Addendum)
Thank you for choosing Primary Care at [facility] to be your medical home!    Samuel [name] was seen by Joaquin Courts, FNP today.   Samuel [name]'s primary care provider is Bing Neighbors, FNP.   For the best care possible, you should try to see Joaquin Courts, FNP-C whenever you come to the clinic.   We look forward to seeing you again soon!  If you have any questions about your visit today, please call us at [phone] or feel free to reach your primary care provider via MyChart.      Pancreatitis Eating Plan Pancreatitis is when your pancreas becomes irritated and swollen (inflamed). The pancreas is a small organ located behind your stomach. It helps your body digest food and regulate your blood sugar. Pancreatitis can affect how your body digests food, especially foods with fat. You may also have other symptoms such as abdominal pain or nausea. When you have pancreatitis, following a low-fat eating plan may help you manage symptoms and recover more quickly. Work with your health care provider or a diet and nutrition specialist (dietitian) to create an eating plan that is right for you. What are tips for following this plan? Reading food labels Use the information on food labels to help keep track of how much fat you eat:  Check the serving size.  Look for the amount of total fat in grams (g) in one serving. ? Low-fat foods have 3 g of fat or less per serving. ? Fat-free foods have 0.5 g of fat or less per serving.  Keep track of how much fat you eat based on how many servings you eat. ? For example, if you eat two servings, the amount of fat you eat will be two times what is listed on the label. Shopping   Buy low-fat or nonfat foods, such as: ? Fresh, frozen, or canned fruits and vegetables. ? Grains, including pasta, bread, and rice. ? Lean meat, poultry, fish, and other protein foods. ? Low-fat or nonfat dairy.  Avoid buying bakery products and other  sweets made with whole milk, butter, and eggs.  Avoid buying snack foods with added fat, such as anything with butter or cheese flavoring. Cooking  Remove skin from poultry, and remove extra fat from meat.  Limit the amount of fat and oil you use to 6 teaspoons or less per day.  Cook using low-fat methods, such as boiling, broiling, grilling, steaming, or baking.  Use spray oil to cook. Add fat-free chicken broth to add flavor and moisture.  Avoid adding cream to thicken soups or sauces. Use other thickeners such as corn starch or tomato paste. Meal planning   Eat a low-fat diet as told by your dietitian. For most people, this means having no more than 55-65 grams of fat each day.  Eat small, frequent meals throughout the day. For example, you may have 5-6 small meals instead of 3 large meals.  Drink enough fluid to keep your urine pale yellow.  Do not drink alcohol. Talk to your health care provider if you need help stopping.  Limit how much caffeine you have, including black coffee, black and green tea, caffeinated soft drinks, and energy drinks. General information  Let your health care provider or dietitian know if you have unplanned weight loss on this eating plan.  You may be instructed to follow a clear liquid diet during a flare of symptoms. Talk with your health care provider about how to manage your  diet during symptoms of a flare.  Take any vitamins or supplements as told by your health care provider.  Work with a Data processing managerdietitian, especially if you have other conditions such as obesity or diabetes mellitus. What foods should I avoid? Fruits Fried fruits. Fruits served with butter or cream. Vegetables Fried vegetables. Vegetables cooked with butter, cheese, or cream. Grains Biscuits, waffles, donuts, pastries, and croissants. Pies and cookies. Butter-flavored popcorn. Regular crackers. Meats and other protein foods Fatty cuts of meat. Poultry with skin. Organ meats.  Bacon, sausage, and cold cuts. Whole eggs. Nuts and nut butters. Dairy Whole and 2% milk. Whole milk yogurt. Whole milk ice cream. Cream and half-and-half. Cream cheese. Sour cream. Cheese. Beverages Wine, beer, and liquor. The items listed above may not be a complete list of foods and beverages to avoid. Contact a dietitian for more information. Summary  Pancreatitis can affect how your body digests food, especially foods with fat.  When you have pancreatitis, it is recommended that you follow a low-fat eating plan to help you recover more quickly and manage symptoms. For most people, this means limiting fat to no more than 55-65 grams per day.  Do not drink alcohol. Limit the amount of caffeine you have, and drink enough fluid to keep your urine pale yellow. This information is not intended to replace advice given to you by your health care provider. Make sure you discuss any questions you have with your health care provider. Document Released: 01/02/2018 Document Revised: 01/02/2018 Document Reviewed: 01/02/2018 Elsevier Interactive Patient Education  2019 [employer]  Liver Function Tests Why am I having this test? Liver function tests are done to see how well your liver is working. The proteins and enzymes measured in the test can alert your health care provider to inflammation, damage, or disease in your liver. It is common to have liver function tests:  When you are taking certain medicines.  If you have liver disease.  If you drink a lot of alcohol.  When you are not feeling well.  When you have other conditions that may affect your liver.  During annual physical exams.  If you have symptoms such as yellowing of the skin (jaundice), abdominal pain, or nausea and vomiting. What is being tested? These tests measure various substances in your blood. This may include:  Alanine transaminase (ALT). This is an enzyme in the liver.  Aspartate transaminase (AST). This is an  enzyme in the liver, heart, and muscles.  Alkaline phosphatase (ALP). This is a protein in the liver, bile ducts, bone, and other body tissues.  Total bilirubin. This is a yellow pigment in bile.  Albumin. This is a protein in the liver.  Prothrombin time and international normalized ratio (PT and INR). PT measures the time it takes for your blood to clot. INR is a calculation of blood clotting time based on your PT result. It is also calculated based on normal ranges defined by the lab that processed your test.  Total protein. This includes two proteins, albumin and globulin, found in the blood. What kind of sample is taken?  A blood sample is required for this test. It is usually collected by inserting a needle into a blood vessel. How do I prepare for this test? How you prepare will depend on which tests are being done and the reason for doing them. You may need to:  Avoid eating for 4-6 hours before the test, or as told by your health care provider.  Stop taking  certain medicines before your blood test, as told by your health care provider. Tell a health care provider about:  All medicines you are taking, including vitamins, herbs, eye drops, creams, and over-the-counter medicines.  Any medical conditions you have.  Whether you are pregnant or may be pregnant. How are the results reported? Your test results will be reported as values. Your health care provider will compare your results to normal ranges that were established after testing a large group of people (reference ranges). Reference ranges may vary among labs and hospitals. For the substances measured in liver function tests, common reference ranges are: ALT  Infant: 10-40 international units/L.  Child or adult: 4-36 international units/L at 37C or 4-36 units/L (SI units).  Reference ranges may be higher for older adults. AST  Newborn 50-155 days old: 35-140 units/L.  Child younger than 30665 years old: 15-60  units/L.  313-59 years old: 15-50 units/L.  736-59 years old: 10-50 units/L.  8312-32665 years old: 10-40 units/L.  Adult: 0-35 units/L or 0-0.58 ?kat/L (SI units).  Reference ranges may be higher for older adults. ALP  Child younger than 59 years old: 85-235 units/L.  352-59 years old: 65-210 units/L.  739-59 years old: 60-300 units/L.  5316-29 years old: 30-200 units/L.  Adult: 30-120 units/L or 0.5-2.0 ?kat/L (SI units).  Reference ranges may be higher for older adults. Total bilirubin  Newborn: 1.0-12.0 mg/dL or 40.9-81117.1-205 ?mol/L (SI units).  Child or adult: 0.3-1.0 mg/dL or 9.1-475.1-17 ?mol/L. Albumin  Premature infant: 3.0-4.2 g/dL.  Newborn: 3.5-5.4 g/dL.  Infant: 4.4-5.4 g/dL.  Child: 4.0-5.9 g/dL.  Adult: 3.5-5.0 g/dL or 82-9535-50 g/L (SI units). PT  11.0-12.5 seconds; 85%-100%. INR  0.8-1.1. Total protein  Premature infant: 4.2-7.6 g/dL.  Newborn: 4.6-7.4 g/dL.  Infant: 6.0-6.7 g/dL.  Child: 6.2-8.0 g/dL.  Adult: 6.4-8.3 g/dL or 62-1364-83 g/L (SI units). What do the results mean? Results that are within the reference ranges are considered normal. For each substance measured, results outside the reference range can indicate various health issues. ALT  Levels above the normal range may indicate liver disease. AST  Levels above the normal range may indicate liver disease. Sometimes levels also increase after burns, surgery, heart attack, muscle damage, or seizure. ALP  Levels above the normal range may be seen in biliary obstruction, liver diseases, bone disease, thyroid disease, tumors, fractures, leukemia, lymphoma, or several other conditions. People with blood type O or B may show higher levels after a fatty meal.  Levels below the normal range may indicate bone and teeth conditions, malnutrition, protein deficiency, or Wilson's disease. Total bilirubin  Levels above the normal range may indicate problems with the liver, gallbladder, or bile ducts. Albumin  Levels  above the normal range may indicate dehydration. They may also be caused by a diet that is high in protein.  Levels below the normal range may indicate kidney disease, liver disease, or malabsorption of nutrients. PT and INR  Levels above the normal range mean that your blood is clotting slower than normal. This may be due to blood disorders, liver disorders, or low levels of vitamin K. Total protein  Levels above the normal range may be due to infection or other diseases.  Levels below the normal range may be due to an immune system disorder, bleeding, burns, kidney disorder, liver disease, trouble absorbing or getting nutrients, or other conditions that affect the intestines. Talk with your health care provider about what your results mean. Questions to ask your health care provider Ask your  health care provider, or the department that is doing the test:  When will my results be ready?  How will I get my results?  What are my treatment options?  What other tests do I need?  What are my next steps? Summary  Liver function tests are done to see how well your liver is working.  These tests measure various proteins and enzymes in your blood. The results can alert your health care provider to inflammation, damage, or disease in your liver.  Talk with your health care provider about what your results mean. This information is not intended to replace advice given to you by your health care provider. Make sure you discuss any questions you have with your health care provider. Document Released: 10/29/2004 Document Revised: 07/11/2017 Document Reviewed: 07/11/2017 Elsevier Interactive Patient Education  2019 [employer].

## 2018-10-04 NOTE — Progress Notes (Signed)
Samuel [name], is a 57 y.o. male  ZOX:[id number]:[id number]  [id number]:[id number]  DOB - 15-Nov-1960  CC:  Chief Complaint  Patient presents with  . Establish Care  . Follow-up    ED->Hosp 12/2-12/4: transaminitis, chronic pancreatitis, alcohol abuse. returned to ED on 12/5 for seizures due to alcohol withdrawal       HPI: Samuel [name] is a 57 y.o. male , with history chronic smoking, is here today to establish care.   Samuel [name] has HYPONATREMIA; HYPOKALEMIA; Alcohol abuse; TOBACCO ABUSE; COCAINE ABUSE; NEUROPATHY; Acute pancreatitis; and Transaminitis on their problem list.    Today's visit:  Patient presents today for hospital follow-up. History is significant for polysubstance abuse. He was admitted to the hospital for transaminitis on 12/2 through 12/4 secondary to chronic pancreatitis and alcohol abuse. He has not had a primary care provider and has h received primary care mostly during encounters at the ED. During his recent hospital admission his liver enzymes were significantly elevated with a max increase of AST 162,  ALT 117 with albumin decreased at 2.9.  He was scheduled to follow-up at [facility] gastro[facility], however no showed schedule appointment.  Today he reports he was unaware of a follow-up that has been scheduled. He continues to drink alcohol in spite of diagnosis. He also subsequently presented to the ER on 09/13/2018 after experiencing seizure-like activity witnessed by coworkers. He reports that he had been without alcohol for a period of approximately 3 to 4 days and only remembers passing out. He underwent a head CT which is negative of in any abnormal findings. Patient denies new headaches, chest pain, abdominal pain, nausea, new weakness , numbness or tingling, SOB, edema, or worrisome cough. Reports good appetite, regular bowel movements, and negative of nausea or vomiting.  Current medications: Current Outpatient Medications:  .  aspirin 325 MG tablet, Take 325 mg by mouth every  6 (six) hours as needed for mild pain or headache. , Disp: , Rfl:  .  lipase/protease/amylase 24000-76000 units CPEP, Take 1 capsule (24,000 Units total) by mouth 3 (three) times daily before meals., Disp: 90 capsule, Rfl: 0   Pertinent family medical history: family history is not on file.   No Known Allergies  Social History   Socioeconomic History  . Marital status: Divorced    Spouse name: Not on file  . Number of children: Not on file  . Years of education: Not on file  . Highest education level: Not on file  Occupational History  . Not on file  Social Needs  . Financial resource strain: Not on file  . Food insecurity:    Worry: Not on file    Inability: Not on file  . Transportation needs:    Medical: Not on file    Non-medical: Not on file  Tobacco Use  . Smoking status: Current Every Day Smoker    Packs/day: 1.00    Years: 45.00    Pack years: 45.00    Types: Cigarettes  . Smokeless tobacco: Current User  Substance and Sexual Activity  . Alcohol use: Yes    Alcohol/week: 3.0 standard drinks    Types: 3 Cans of beer per week    Comment: DAILY  . Drug use: Not Currently  . Sexual activity: Not on file  Lifestyle  . Physical activity:    Days per week: Not on file    Minutes per session: Not on file  . Stress: Not on file  Relationships  . Social connections:  Talks on phone: Not on file    Gets together: Not on file    Attends religious service: Not on file    Active member of club or organization: Not on file    Attends meetings of clubs or organizations: Not on file    Relationship status: Not on file  . Intimate partner violence:    Fear of current or ex partner: Not on file    Emotionally abused: Not on file    Physically abused: Not on file    Forced sexual activity: Not on file  Other Topics Concern  . Not on file  Social History Narrative  . Not on file    Review of Systems: Constitutional: Negative for fever, chills, diaphoresis,  activity change, appetite change and fatigue. HENT: Negative for ear pain, nosebleeds, congestion, facial swelling, rhinorrhea, neck pain, neck stiffness and ear discharge.  Eyes: Negative for pain, discharge, redness, itching and visual disturbance. Respiratory: Negative for cough, choking, chest tightness, shortness of breath, wheezing and stridor.  Cardiovascular: Negative for chest pain, palpitations and leg swelling. Gastrointestinal: Negative for abdominal distention. Genitourinary: Negative for dysuria, urgency, frequency, hematuria, flank pain, decreased urine volume, difficulty urinating. Musculoskeletal: Negative for back pain, joint swelling, arthralgia and gait problem. Neurological: Negative for dizziness, tremors, seizures, syncope, facial asymmetry, speech difficulty, weakness, light-headedness, numbness and headaches.  Hematological: Negative for adenopathy. Does not bruise/bleed easily. Psychiatric/Behavioral: Negative for hallucinations, behavioral problems, confusion, dysphoric mood, decreased concentration and agitation.    Objective:   Vitals:   10/04/18 0844  BP: 123/85  Pulse: 91  Resp: 17  SpO2: 98%    BP Readings from Last 3 Encounters:  10/04/18 123/85  09/13/18 129/72  09/12/18 (!) 149/89    Filed Weights   10/04/18 0844  Weight: 114 lb 6.4 oz (51.9 kg)      Physical Exam: Constitutional: Patient appears very thin, poorly nourished, and cooperative. HENT: Normocephalic, atraumatic, External right and left ear normal. Oropharynx is clear and moist.  Eyes: Conjunctivae and EOM are normal. PERRLA, no scleral icterus. Neck: Normal ROM. Neck supple. No JVD. No tracheal deviation. No thyromegaly. CVS: RRR, S1/S2 +, no murmurs, no gallops, no carotid bruit.  Pulmonary: Effort and breath sounds normal, no stridor, rhonchi, wheezes, rales.  Abdominal: Soft. BS +, no distension, tenderness, rebound or guarding.  Musculoskeletal: Normal range of motion. No  edema and no tenderness.  Neuro: Alert. Normal muscle tone coordination. Normal gait. BUE and BLE strength 5/5. Bilateral hand grips symmetrical. No cranial nerve deficit. Skin: Skin is warm and dry. No rash noted. Not diaphoretic. No erythema. No pallor. Psychiatric: Normal mood and affect. Behavior, judgment, thought content normal.  Lab Results (prior encounters)  Lab Results  Component Value Date   WBC 5.2 09/13/2018   HGB 11.1 (L) 09/13/2018   HCT 32.6 (L) 09/13/2018   MCV 93.4 09/13/2018   PLT 62 (L) 09/13/2018   Lab Results  Component Value Date   CREATININE 0.63 09/13/2018   BUN <5 (L) 09/13/2018   NA 133 (L) 09/13/2018   K 3.4 (L) 09/13/2018   CL 98 09/13/2018   CO2 27 09/13/2018    Lab Results  Component Value Date   HGBA1C  04/28/2007    5.6 (NOTE)   The ADA recommends the following therapeutic goals for glycemic   control related to Hgb A1C measurement:   Goal of Therapy:   < 7.0% Hgb A1C   Action Suggested:  > 8.0% Hgb A1C   Ref:  Diabetes Care, 22, Suppl. 1, 1999       Component Value Date/Time   CHOL  07/13/2010 0342    128        ATP III CLASSIFICATION:  <200     mg/dL   Desirable  161-096  mg/dL   Borderline High  >=045    mg/dL   High          TRIG 56 07/13/2010 0342   HDL 56 07/13/2010 0342   CHOLHDL 2.3 07/13/2010 0342   VLDL 11 07/13/2010 0342   LDLCALC  07/13/2010 0342    61        Total Cholesterol/HDL:CHD Risk Coronary Heart Disease Risk Table                     Men   Women  1/2 Average Risk   3.4   3.3  Average Risk       5.0   4.4  2 X Average Risk   9.6   7.1  3 X Average Risk  23.4   11.0        Use the calculated Patient Ratio above and the CHD Risk Table to determine the patient's CHD Risk.        ATP III CLASSIFICATION (LDL):  <100     mg/dL   Optimal  409-811  mg/dL   Near or Above                    Optimal  130-159  mg/dL   Borderline  914-782  mg/dL   High  >956     mg/dL   Very High        Assessment and plan:   1. Encounter to establish care  2. Transaminitis, secondary to alcohol abuse and chronic pancreatitis  Work-up negative for  Hepatitis  Repeating: - Comprehensive metabolic panel - CBC with Differential Referring: - Ambulatory referral to Gastroenterology  3. Alcohol abuse Counseled on cessation, patient is interested in cutting back.  4. Hypokalemia -rechecking CMP today  5. Screening for diabetes mellitus - Hemoglobin A1c  6. Screening for thyroid disorder - Thyroid Panel With TSH  7. Screening, lipid - Lipid panel; Future  8. Elevated liver enzymes, hx of pancreatitis  - Ambulatory referral to Gastroenterology   Orders Placed This Encounter  Procedures  . Hemoglobin A1c  . Comprehensive metabolic panel  . CBC with Differential  . Thyroid Panel With TSH  . Lipid panel  . Ambulatory referral to Gastroenterology    Meds ordered this encounter  Medications  . Albuterol Sulfate 108 (90 Base) MCG/ACT AEPB    Sig: Inhale 2 puffs into the lungs every 4 (four) hours as needed.    Dispense:  1 each    Refill:  1      Return in about 3 months (around 01/03/2019).   The patient was given clear instructions to go to ER or return to medical center if symptoms don't improve, worsen or new problems develop. The patient verbalized understanding. The patient was advised  to call and obtain lab results if they haven't heard anything from out office within 7-10 business days.  Joaquin Courts, FNP Primary Care at [facility] [address] [place] [address] [phone]: [phone]    This note has been created with Dragon speech recognition software and Paediatric nurse. Any transcriptional errors are unintentional.

## 2018-10-05 LAB — CBC WITH DIFFERENTIAL/PLATELET
Basophils Absolute: 0.1 10*3/uL (ref 0.0–0.2)
Basos: 1 %
EOS (ABSOLUTE): 0.1 10*3/uL (ref 0.0–0.4)
Eos: 2 %
Hematocrit: 37.3 % — ABNORMAL LOW (ref 37.5–51.0)
Hemoglobin: 12.9 g/dL — ABNORMAL LOW (ref 13.0–17.7)
Immature Grans (Abs): 0 10*3/uL (ref 0.0–0.1)
Immature Granulocytes: 0 %
LYMPHS: 28 %
Lymphocytes Absolute: 1.5 10*3/uL (ref 0.7–3.1)
MCH: 33.8 pg — ABNORMAL HIGH (ref 26.6–33.0)
MCHC: 34.6 g/dL (ref 31.5–35.7)
MCV: 98 fL — AB (ref 79–97)
Monocytes Absolute: 0.5 10*3/uL (ref 0.1–0.9)
Monocytes: 9 %
NEUTROS ABS: 3.2 10*3/uL (ref 1.4–7.0)
Neutrophils: 60 %
Platelets: 236 10*3/uL (ref 150–450)
RBC: 3.82 x10E6/uL — ABNORMAL LOW (ref 4.14–5.80)
RDW: 14.4 % (ref 12.3–15.4)
WBC: 5.3 10*3/uL (ref 3.4–10.8)

## 2018-10-05 LAB — COMPREHENSIVE METABOLIC PANEL
ALT: 31 IU/L (ref 0–44)
AST: 45 IU/L — ABNORMAL HIGH (ref 0–40)
Albumin/Globulin Ratio: 1.4 (ref 1.2–2.2)
Albumin: 4.4 g/dL (ref 3.5–5.5)
Alkaline Phosphatase: 82 IU/L (ref 39–117)
BUN/Creatinine Ratio: 11 (ref 9–20)
BUN: 10 mg/dL (ref 6–24)
Bilirubin Total: 0.7 mg/dL (ref 0.0–1.2)
CO2: 18 mmol/L — ABNORMAL LOW (ref 20–29)
Calcium: 9.5 mg/dL (ref 8.7–10.2)
Chloride: 103 mmol/L (ref 96–106)
Creatinine, Ser: 0.92 mg/dL (ref 0.76–1.27)
GFR calc Af Amer: 105 mL/min/{1.73_m2} (ref >59)
GFR calc non Af Amer: 91 mL/min/{1.73_m2} (ref >59)
Globulin, Total: 3.2 g/dL (ref 1.5–4.5)
Glucose: 164 mg/dL — ABNORMAL HIGH (ref 65–99)
Potassium: 3.9 mmol/L (ref 3.5–5.2)
Sodium: 136 mmol/L (ref 134–144)
Total Protein: 7.6 g/dL (ref 6.0–8.5)

## 2018-10-05 LAB — HEMOGLOBIN A1C
Est. average glucose Bld gHb Est-mCnc: 103 mg/dL
Hgb A1c MFr Bld: 5.2 % (ref 4.8–5.6)

## 2018-10-05 LAB — THYROID PANEL WITH TSH
Free Thyroxine Index: 1.9 (ref 1.2–4.9)
T3 Uptake Ratio: 25 % (ref 24–39)
T4, Total: 7.5 ug/dL (ref 4.5–12.0)
TSH: 1.71 u[IU]/mL (ref 0.450–4.500)

## 2018-10-08 ENCOUNTER — Encounter: Payer: Self-pay | Admitting: Family Medicine

## 2018-10-08 NOTE — Progress Notes (Signed)
Mail lab letter  

## 2018-10-09 ENCOUNTER — Other Ambulatory Visit (HOSPITAL_COMMUNITY): Payer: Self-pay | Admitting: Family Medicine

## 2018-10-11 ENCOUNTER — Encounter: Payer: Self-pay | Admitting: Gastroenterology

## 2018-11-02 ENCOUNTER — Ambulatory Visit (HOSPITAL_COMMUNITY)
Admission: EM | Admit: 2018-11-02 | Discharge: 2018-11-02 | Disposition: A | Discharge status: Home / Self Care | Payer: BLUE CROSS/BLUE SHIELD | Attending: Internal Medicine | Admitting: Internal Medicine

## 2018-11-02 ENCOUNTER — Encounter (HOSPITAL_COMMUNITY): Payer: Self-pay | Admitting: Emergency Medicine

## 2018-11-02 DIAGNOSIS — R1032 Left lower quadrant pain: Secondary | ICD-10-CM | POA: Diagnosis not present

## 2018-11-02 NOTE — ED Provider Notes (Signed)
[facility]    CSN: [id number] Arrival date & time: 11/02/18  [id number]     History   Chief Complaint Chief Complaint  Patient presents with  . Appointment    930  . Arm Pain    HPI Samuel [name] is a 58 y.o. male who has a history of chronic alcohol abuse and recurrent pancreatitis comes to the urgent care department on account of diarrhea and generalized weakness.  Diarrhea started a couple of days ago.  He has had several episodes of loose bowel movements.  Bowel movements were watery, nonbloody and without mucus.  No abdominal pain or cramps.  No relieving factors.  Patient denies any nausea, vomiting, increased thirst and dizziness.  Patient continues to drink alcohol.  His drink of choice is beer.  He drinks about 3 cans of 12 ounce beers.  HPI  Past Medical History:  Diagnosis Date  . Pancreatitis     Patient Active Problem List   Diagnosis Date Noted  . Transaminitis 09/11/2018  . HYPOKALEMIA 07/28/2010  . TOBACCO ABUSE 07/28/2010  . COCAINE ABUSE 07/28/2010  . NEUROPATHY 07/28/2010  . HYPONATREMIA 07/12/2010  . Alcohol abuse 07/12/2010  . Acute pancreatitis 07/12/2010    Past Surgical History:  Procedure Laterality Date  . NO PAST SURGERIES         Home Medications    Prior to Admission medications   Medication Sig Start Date End Date Taking? Authorizing Provider  Albuterol Sulfate 108 (90 Base) MCG/ACT AEPB Inhale 2 puffs into the lungs every 4 (four) hours as needed. 10/04/18   Bing NeighborsHarris, Kimberly S, FNP  aspirin 325 MG tablet Take 325 mg by mouth every 6 (six) hours as needed for mild pain or headache.     [provider]  lipase/protease/amylase 24000-76000 units CPEP Take 1 capsule (24,000 Units total) by mouth 3 (three) times daily before meals. 09/12/18   Allayne StackBeard, Samantha N, DO    Family History Family History  Problem Relation Age of Onset  . Pancreatitis Neg Hx     Social History Social History   Tobacco Use  . Smoking  status: Current Every Day Smoker    Packs/day: 1.00    Years: 45.00    Pack years: 45.00    Types: Cigarettes  . Smokeless tobacco: Current User  Substance Use Topics  . Alcohol use: Yes    Alcohol/week: 3.0 standard drinks    Types: 3 Cans of beer per week    Comment: DAILY  . Drug use: Not Currently     Allergies   Patient has no known allergies.   Review of Systems Review of Systems  Constitutional: Negative for activity change and appetite change.  HENT: Negative for congestion, ear discharge and ear pain.   Gastrointestinal: Negative for abdominal distention and vomiting.  Endocrine: Negative for cold intolerance.  Genitourinary: Negative for dysuria and urgency.  Allergic/Immunologic: Negative for environmental allergies and food allergies.  Neurological: Negative for dizziness, tremors and numbness.     Physical Exam Triage Vital Signs ED Triage Vitals  Enc Vitals Group     BP 11/02/18 0946 (!) 150/70     Pulse Rate 11/02/18 0946 90     Resp 11/02/18 0946 18     Temp 11/02/18 0946 (!) 97.5 F (36.4 C)     Temp src --      SpO2 11/02/18 0946 100 %     Weight --      Height --  Head Circumference --      Peak Flow --      Pain Score 11/02/18 0948 10     Pain Loc --      Pain Edu? --      Excl. in GC? --    No data found.  Updated Vital Signs BP (!) 150/70   Pulse 90   Temp (!) 97.5 F (36.4 C)   Resp 18   SpO2 100%   Visual Acuity Right Eye Distance:   Left Eye Distance:   Bilateral Distance:    Right Eye Near:   Left Eye Near:    Bilateral Near:     Physical Exam Constitutional:      Appearance: Normal appearance.  HENT:     Right Ear: Tympanic membrane normal.     Left Ear: Tympanic membrane normal.     Nose: Nose normal.     Mouth/Throat:     Mouth: Mucous membranes are moist.  Eyes:     Extraocular Movements: Extraocular movements intact.     Pupils: Pupils are equal, round, and reactive to light.  Neck:      Musculoskeletal: Neck supple.  Cardiovascular:     Rate and Rhythm: Normal rate and regular rhythm.     Heart sounds: No murmur. No gallop.   Pulmonary:     Effort: No respiratory distress.     Breath sounds: No wheezing or rales.  Abdominal:     General: Abdomen is flat.     Palpations: Abdomen is soft.  Skin:    General: Skin is warm.     Capillary Refill: Capillary refill takes less than 2 seconds.     Coloration: Skin is not jaundiced or pale.     Findings: No rash.  Neurological:     Mental Status: He is alert.      UC Treatments / Results  Labs (all labs ordered are listed, but only abnormal results are displayed) Labs Reviewed - No data to display  EKG None  Radiology No results found.  Procedures Procedures (including critical care time)  Medications Ordered in UC Medications - No data to display  Initial Impression / Assessment and Plan / UC Course  I have reviewed the triage vital signs and the nursing notes.  Pertinent labs & imaging results that were available during my care of the patient were reviewed by me and considered in my medical decision making (see chart for details).     1.  Acute diarrhea: Encourage oral fluid intake Patient is advised to quit alcohol use Supportive care- antiemetic agents if nausea is persistent Over-the-counter Prilosec if abdominal pain is persistent.  2.  Chronic alcohol use: Alcohol cessation advice was reiterated Continue OTC multivitamin and OTC thiamine Final Clinical Impressions(s) / UC Diagnoses   Final diagnoses:  Abdominal pain, left lower quadrant  Left lower quadrant abdominal pain   Discharge Instructions   None    ED Prescriptions    None     Controlled Substance Prescriptions [place] Controlled Substance Registry consulted? No   Merrilee Jansky, MD 11/02/18 [phone]

## 2018-11-02 NOTE — ED Triage Notes (Signed)
Pt states he is here for arthritis pain, states he was told by his PCP to come here. States "I have cramps everywhere" states its going on x1 week.

## 2018-11-06 ENCOUNTER — Encounter: Payer: Self-pay | Admitting: Gastroenterology

## 2018-11-06 ENCOUNTER — Ambulatory Visit (INDEPENDENT_AMBULATORY_CARE_PROVIDER_SITE_OTHER): Payer: BLUE CROSS/BLUE SHIELD | Admitting: Gastroenterology

## 2018-11-06 VITALS — BP 114/70 | HR 80 | Ht 65.0 in | Wt 112.6 lb

## 2018-11-06 DIAGNOSIS — G8929 Other chronic pain: Secondary | ICD-10-CM

## 2018-11-06 DIAGNOSIS — K529 Noninfective gastroenteritis and colitis, unspecified: Secondary | ICD-10-CM

## 2018-11-06 DIAGNOSIS — R1013 Epigastric pain: Secondary | ICD-10-CM

## 2018-11-06 DIAGNOSIS — K860 Alcohol-induced chronic pancreatitis: Secondary | ICD-10-CM | POA: Diagnosis not present

## 2018-11-06 DIAGNOSIS — R634 Abnormal weight loss: Secondary | ICD-10-CM

## 2018-11-06 DIAGNOSIS — R945 Abnormal results of liver function studies: Secondary | ICD-10-CM

## 2018-11-06 DIAGNOSIS — R7989 Other specified abnormal findings of blood chemistry: Secondary | ICD-10-CM

## 2018-11-06 MED ORDER — PANCRELIPASE (LIP-PROT-AMYL) 24000-76000 UNITS PO CPEP: 72000.0000 [IU] | ORAL_CAPSULE | Freq: Three times a day (TID) | ORAL | 1 refills | Status: DC

## 2018-11-06 NOTE — Patient Instructions (Signed)
If you are age 60 or older, your body mass index should be between 23-30. Your Body mass index is 18.74 kg/m. If this is out of the aforementioned range listed, please consider follow up with your Primary Care Provider.  If you are age 62 or younger, your body mass index should be between 19-25. Your Body mass index is 18.74 kg/m. If this is out of the aformentioned range listed, please consider follow up with your Primary Care Provider.   Please take the pancreatic enzymes as directed. 3 by mouth, 3 times a day with meals.   Use Imodium as needed.   Follow up with Dr. Marina Goodell as scheduled.   It was a pleasure to see you today!  Dr. Myrtie Neither

## 2018-11-06 NOTE — Progress Notes (Signed)
[facility] Progress Note  Chief Complaint: Abdominal pain and diarrhea  Subjective  History:  Mr. Samuel [name] is being seen for the first time in clinic, having recently been admitted and seen in consultation by Dr. Yancey FlemingsJohn Perry. He was admitted to the hospital on December 2 with abdominal pain and a history of alcohol and drug abuse and chronic pancreatitis.  Imaging revealed changes of chronic calcific pancreatitis, suspected to be the cause of his diarrhea.  He had significant elevation of transaminases thought to be combination of alcohol and some other unknown toxin from a cold remedy he was taking.   In addition, CT scan showed pancreatic duct stone with proximal ductal dilatation.  Dr. Marina GoodellPerry felt it did not require intervention at that time. Mr. Samuel [name] was back in the emergency department on December 5 for a seizure, had a primary care office follow-up on December 26, and back in the ED last week for abdominal pain.  He was discharged on pancrelipase 24,000 unit, 1 capsule 3 times daily with meals.  He reports that the diarrhea improved somewhat on that, but he ran out and did not have refills.  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  He has multiple other somatic complaints including generalized fatigue, arthritis in elbows and hands, poor appetite and ongoing weight loss. He believes he has lost 15 to 20 pounds in the last couple of months.  ROS: Cardiovascular:  no chest pain Respiratory: no dyspnea Remainder of systems negative except as above  The patient's Past Medical, Family and Social History were reviewed and are on file in the EMR. He continues to drink alcohol daily  objective:  Med list reviewed  Current Outpatient Medications:  .  aspirin 325 MG tablet, Take 325 mg by mouth every 6 (six) hours as needed for mild pain or headache. , Disp: ,  Rfl:  .  Albuterol Sulfate 108 (90 Base) MCG/ACT AEPB, Inhale 2 puffs into the lungs every 4 (four) hours as needed., Disp: 1 each, Rfl: 1 .  Pancrelipase, Lip-Prot-Amyl, 24000-76000 units CPEP, Take 3 capsules (72,000 Units total) by mouth 3 (three) times daily before meals., Disp: 270 capsule, Rfl: 1   Vital signs in last 24 hrs: Vitals:   11/06/18 0903  BP: 114/70  Pulse: 80    Physical Exam  Chronically ill-appearing man,smells of alcohol  HEENT: sclera anicteric, oral mucosa moist without lesions poor dentition   neck: supple, no thyromegaly, JVD or lymphadenopathy  Cardiac: RRR without murmurs, S1S2 heard, no peripheral edema  Pulm: clear to auscultation bilaterally, normal RR and effort noted  Abdomen: soft, epigastric tenderness, with active bowel sounds. No guarding or palpable hepatosplenomegaly.  Skin; warm and dry, no jaundice or rash No visible inflammation or joint deformity in hands.  Recent Labs:  CMP Latest Ref Rng & Units 10/04/2018 09/13/2018 09/12/2018  Glucose 65 - 99 mg/dL 161(W164(H) 960(A157(H) 540(J139(H)  BUN 6 - 24 mg/dL 10 <8(J<5(L) <1(B<5(L)  Creatinine 0.76 - 1.27 mg/dL 1.470.92 8.290.63 5.620.76  Sodium 134 - 144 mmol/L 136 133(L) 135  Potassium 3.5 - 5.2 mmol/L 3.9 3.4(L) 3.7  Chloride 96 - 106 mmol/L 103 98 100  CO2 20 - 29 mmol/L 18(L) 27 28  Calcium 8.7 - 10.2 mg/dL 9.5 1.3(Y8.6(L) 8.6(V8.2(L)  Total Protein 6.0 - 8.5 g/dL 7.6 6.5 7.8(I5.7(L)  Total Bilirubin  0.0 - 1.2 mg/dL 0.7 2.9(H) 3.4(H)  Alkaline Phos 39 - 117 IU/L 82 132(H) 172(H)  AST 0 - 40 IU/L 45(H) 162(H) 317(H)  ALT 0 - 44 IU/L 31 117(H) 154(H)     Radiologic studies:  CLINICAL DATA:  58 year old male with abdominal pain.   EXAM: CT ABDOMEN AND PELVIS WITH CONTRAST   TECHNIQUE: Multidetector CT imaging of the abdomen and pelvis was performed using the standard protocol following bolus administration of intravenous contrast.   CONTRAST:  OMNIPAQUE IOHEXOL 300 MG/ML  SOLN   COMPARISON:  CT of the abdomen  pelvis dated 05/01/2015   FINDINGS: Lower chest: The visualized lung bases are clear.   No intra-abdominal free air or free fluid.   Hepatobiliary: There is diffuse fatty infiltration of the liver. No intrahepatic biliary ductal dilatation. The gallbladder is distended otherwise unremarkable.   Pancreas: There are coarse calcifications of the uncinate process of the pancreas, likely sequela of chronic pancreatitis. There is diffuse dilatation of the main pancreatic duct measuring up to 9 mm in diameter. There is a 7 mm stone in the pancreatic duct at the ampulla. No definite active inflammatory changes. Further evaluation with MRI without and with contrast is recommended to exclude an obstructing mass.   Spleen: Normal in size without focal abnormality.   Adrenals/Urinary Tract: The adrenal glands are unremarkable. There is no hydronephrosis on either side. There is symmetric enhancement and excretion of contrast by both kidneys. A 1 cm exophytic hypodense lesion with layering curvilinear high attenuating content along the inferolateral aspect of the left kidney is not well characterized. Additional subcentimeter bilateral renal hypodense lesions are not characterized. The visualized ureters and urinary bladder appear unremarkable.   Stomach/Bowel: There is no bowel obstruction or active inflammation. Normal appendix.   Vascular/Lymphatic: The abdominal aorta and IVC appear unremarkable. No portal venous gas. There is no adenopathy.   Reproductive: Enlarged prostate gland measuring 4.8 cm in transverse axial diameter.   Other: None   Musculoskeletal: No acute or significant osseous findings.   IMPRESSION: 1. Diffusely dilatation of the main pancreatic duct with moderate atrophy of the gland likely sequela of an obstructing stone at the ampulla. Further evaluation with MRI without and with contrast is recommended to exclude underlying lesion. No definite  active inflammatory changes. 2. Fatty liver. 3. No bowel obstruction or active inflammation. Normal appendix.     Electronically Signed   By: Elgie Collard M.D.   On: 09/11/2018 06:35   @ASSESSMENTPLANBEGIN @ Assessment: Encounter Diagnoses  Name Primary?  . Chronic diarrhea Yes  . Abnormal loss of weight   . Alcohol-induced chronic pancreatitis (HCC)   . Abdominal pain, chronic, epigastric   . Elevated LFTs     LFTs have almost normalized since hospital discharge, however he is unfortunately continuing to drink alcohol.  I told him, in no uncertain terms, he must do whatever he can to become completely abstinent from alcohol as soon as possible because it has caused him serious chronic problems that will only worsen with ongoing alcohol use.  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  He ports some improvement in the diarrhea when he was on pancreas enzyme supplement, but the dose was too low.  He also has multiple other somatic complaints that I advised him to follow-up with primary care to address, and also has questions on whether he can return to work and what work he might do.  I also asked him to address this with primary  care in the near future.  Plan: Prescribed pancrelipase 24,000 unit, 3 capsules 3 times daily with meals. As needed Imodium to help control diarrhea. No pain medicines were prescribed due to the patient's history of alcohol abuse. Follow-up with his primary gastroenterologist Dr. Marina GoodellPerry in 4 to 6 weeks. If the patient is able to stop alcohol use, and has continued symptoms of malabsorption despite adequate dosing of pancreatic enzymes, intervention on the reported pancreatic duct stone may be necessary.   Total time 40 minutes, over half spent face-to-face with patient in counseling and coordination of care.   Charlie PitterHenry L Danis III

## 2018-11-07 NOTE — Progress Notes (Signed)
Contacted patient for clarification on the work note he was asking for, patient stated he would like note to cover him for yesterday and today. Told patient to contact GI office since he was seen there and not at [facility].

## 2018-11-18 ENCOUNTER — Emergency Department (HOSPITAL_COMMUNITY): Payer: BLUE CROSS/BLUE SHIELD

## 2018-11-18 ENCOUNTER — Inpatient Hospital Stay (HOSPITAL_COMMUNITY): Payer: BLUE CROSS/BLUE SHIELD

## 2018-11-18 ENCOUNTER — Inpatient Hospital Stay (HOSPITAL_COMMUNITY)
Admission: EM | Admit: 2018-11-18 | Discharge: 2018-11-22 | DRG: 439 | Disposition: A | Discharge status: Home Health | Payer: BLUE CROSS/BLUE SHIELD | Attending: Internal Medicine | Admitting: Internal Medicine

## 2018-11-18 ENCOUNTER — Encounter (HOSPITAL_COMMUNITY): Payer: Self-pay

## 2018-11-18 DIAGNOSIS — K7689 Other specified diseases of liver: Secondary | ICD-10-CM | POA: Diagnosis present

## 2018-11-18 DIAGNOSIS — K861 Other chronic pancreatitis: Principal | ICD-10-CM | POA: Diagnosis present

## 2018-11-18 DIAGNOSIS — K828 Other specified diseases of gallbladder: Secondary | ICD-10-CM

## 2018-11-18 DIAGNOSIS — R740 Nonspecific elevation of levels of transaminase and lactic acid dehydrogenase [LDH]: Secondary | ICD-10-CM

## 2018-11-18 DIAGNOSIS — R109 Unspecified abdominal pain: Secondary | ICD-10-CM

## 2018-11-18 DIAGNOSIS — K921 Melena: Secondary | ICD-10-CM | POA: Diagnosis not present

## 2018-11-18 DIAGNOSIS — F1010 Alcohol abuse, uncomplicated: Secondary | ICD-10-CM | POA: Diagnosis not present

## 2018-11-18 DIAGNOSIS — K859 Acute pancreatitis without necrosis or infection, unspecified: Secondary | ICD-10-CM | POA: Diagnosis present

## 2018-11-18 DIAGNOSIS — E876 Hypokalemia: Secondary | ICD-10-CM | POA: Diagnosis present

## 2018-11-18 DIAGNOSIS — F17210 Nicotine dependence, cigarettes, uncomplicated: Secondary | ICD-10-CM | POA: Diagnosis present

## 2018-11-18 DIAGNOSIS — R7401 Elevation of levels of liver transaminase levels: Secondary | ICD-10-CM | POA: Diagnosis present

## 2018-11-18 DIAGNOSIS — Z79899 Other long term (current) drug therapy: Secondary | ICD-10-CM | POA: Diagnosis not present

## 2018-11-18 DIAGNOSIS — K860 Alcohol-induced chronic pancreatitis: Secondary | ICD-10-CM | POA: Diagnosis not present

## 2018-11-18 DIAGNOSIS — F10239 Alcohol dependence with withdrawal, unspecified: Secondary | ICD-10-CM | POA: Diagnosis present

## 2018-11-18 DIAGNOSIS — K8689 Other specified diseases of pancreas: Secondary | ICD-10-CM

## 2018-11-18 DIAGNOSIS — R260 Ataxic gait: Secondary | ICD-10-CM | POA: Diagnosis present

## 2018-11-18 DIAGNOSIS — E871 Hypo-osmolality and hyponatremia: Secondary | ICD-10-CM | POA: Diagnosis present

## 2018-11-18 DIAGNOSIS — E878 Other disorders of electrolyte and fluid balance, not elsewhere classified: Secondary | ICD-10-CM | POA: Diagnosis not present

## 2018-11-18 DIAGNOSIS — Z7982 Long term (current) use of aspirin: Secondary | ICD-10-CM | POA: Diagnosis not present

## 2018-11-18 DIAGNOSIS — K8020 Calculus of gallbladder without cholecystitis without obstruction: Secondary | ICD-10-CM | POA: Diagnosis present

## 2018-11-18 DIAGNOSIS — R197 Diarrhea, unspecified: Secondary | ICD-10-CM | POA: Diagnosis present

## 2018-11-18 DIAGNOSIS — R7989 Other specified abnormal findings of blood chemistry: Secondary | ICD-10-CM

## 2018-11-18 DIAGNOSIS — R569 Unspecified convulsions: Secondary | ICD-10-CM | POA: Diagnosis present

## 2018-11-18 DIAGNOSIS — R945 Abnormal results of liver function studies: Secondary | ICD-10-CM | POA: Diagnosis not present

## 2018-11-18 DIAGNOSIS — D6959 Other secondary thrombocytopenia: Secondary | ICD-10-CM | POA: Diagnosis present

## 2018-11-18 DIAGNOSIS — K909 Intestinal malabsorption, unspecified: Secondary | ICD-10-CM | POA: Diagnosis present

## 2018-11-18 DIAGNOSIS — K709 Alcoholic liver disease, unspecified: Secondary | ICD-10-CM | POA: Diagnosis present

## 2018-11-18 DIAGNOSIS — K8050 Calculus of bile duct without cholangitis or cholecystitis without obstruction: Secondary | ICD-10-CM | POA: Diagnosis not present

## 2018-11-18 DIAGNOSIS — D696 Thrombocytopenia, unspecified: Secondary | ICD-10-CM

## 2018-11-18 DIAGNOSIS — F1099 Alcohol use, unspecified with unspecified alcohol-induced disorder: Secondary | ICD-10-CM

## 2018-11-18 LAB — COMPREHENSIVE METABOLIC PANEL
ALT: 112 U/L — ABNORMAL HIGH (ref 0–44)
AST: 222 U/L — ABNORMAL HIGH (ref 15–41)
Albumin: 2.6 g/dL — ABNORMAL LOW (ref 3.5–5.0)
Alkaline Phosphatase: 165 U/L — ABNORMAL HIGH (ref 38–126)
Anion gap: 9 (ref 5–15)
BUN: <5 mg/dL — ABNORMAL LOW (ref 6–20)
CO2: 26 mmol/L (ref 22–32)
Calcium: 8.2 mg/dL — ABNORMAL LOW (ref 8.9–10.3)
Chloride: 99 mmol/L (ref 98–111)
Creatinine, Ser: 0.6 mg/dL — ABNORMAL LOW (ref 0.61–1.24)
GFR calc Af Amer: >60 mL/min (ref >60)
Glucose, Bld: 130 mg/dL — ABNORMAL HIGH (ref 70–99)
Potassium: 3.4 mmol/L — ABNORMAL LOW (ref 3.5–5.1)
Sodium: 134 mmol/L — ABNORMAL LOW (ref 135–145)
Total Bilirubin: 3.8 mg/dL — ABNORMAL HIGH (ref 0.3–1.2)
Total Protein: 6.7 g/dL (ref 6.5–8.1)

## 2018-11-18 LAB — PROTIME-INR
INR: 1.36
Prothrombin Time: 16.6 seconds — ABNORMAL HIGH (ref 11.4–15.2)

## 2018-11-18 LAB — CBC
HCT: 30.1 % — ABNORMAL LOW (ref 39.0–52.0)
Hemoglobin: 10.7 g/dL — ABNORMAL LOW (ref 13.0–17.0)
MCH: 31.4 pg (ref 26.0–34.0)
MCHC: 35.5 g/dL (ref 30.0–36.0)
MCV: 88.3 fL (ref 80.0–100.0)
Platelets: 43 10*3/uL — ABNORMAL LOW (ref 150–400)
RBC: 3.41 MIL/uL — ABNORMAL LOW (ref 4.22–5.81)
RDW: 14.5 % (ref 11.5–15.5)
WBC: 5 10*3/uL (ref 4.0–10.5)
nRBC: 0 % (ref 0.0–0.2)

## 2018-11-18 LAB — TYPE AND SCREEN
ABO/RH(D): O POS
ANTIBODY SCREEN: NEGATIVE

## 2018-11-18 LAB — LIPASE, BLOOD: Lipase: 19 U/L (ref 11–51)

## 2018-11-18 LAB — PHOSPHORUS: Phosphorus: 3 mg/dL (ref 2.5–4.6)

## 2018-11-18 LAB — POC OCCULT BLOOD, ED: Fecal Occult Bld: NEGATIVE

## 2018-11-18 LAB — MAGNESIUM: MAGNESIUM: 1.3 mg/dL — AB (ref 1.7–2.4)

## 2018-11-18 LAB — APTT: aPTT: 41 seconds — ABNORMAL HIGH (ref 24–36)

## 2018-11-18 LAB — ABO/RH: ABO/RH(D): O POS

## 2018-11-18 MED ORDER — ACETAMINOPHEN 650 MG RE SUPP
650.0000 mg | Freq: Four times a day (QID) | RECTAL | Status: DC | PRN
  Administered 2018-11-19: 650 mg via RECTAL
  Filled 2018-11-18: qty 1

## 2018-11-18 MED ORDER — THIAMINE HCL 100 MG/ML IJ SOLN
100.0000 mg | Freq: Every day | INTRAMUSCULAR | Status: DC
  Administered 2018-11-19 – 2018-11-21 (×3): 100 mg via INTRAVENOUS
  Filled 2018-11-18 (×3): qty 2

## 2018-11-18 MED ORDER — THIAMINE HCL 100 MG/ML IJ SOLN
Freq: Once | INTRAVENOUS | Status: AC
  Administered 2018-11-18: 20:00:00 via INTRAVENOUS
  Filled 2018-11-18: qty 1000

## 2018-11-18 MED ORDER — IOHEXOL 300 MG/ML  SOLN
100.0000 mL | Freq: Once | INTRAMUSCULAR | Status: AC | PRN
  Administered 2018-11-18: 100 mL via INTRAVENOUS

## 2018-11-18 MED ORDER — GADOBUTROL 1 MMOL/ML IV SOLN
5.0000 mL | Freq: Once | INTRAVENOUS | Status: AC | PRN
  Administered 2018-11-18: 5 mL via INTRAVENOUS

## 2018-11-18 MED ORDER — SODIUM CHLORIDE 0.9 % IV BOLUS
500.0000 mL | Freq: Once | INTRAVENOUS | Status: AC
  Administered 2018-11-18: 500 mL via INTRAVENOUS

## 2018-11-18 MED ORDER — ADULT MULTIVITAMIN W/MINERALS CH
1.0000 | ORAL_TABLET | Freq: Every day | ORAL | Status: DC
  Administered 2018-11-18 – 2018-11-22 (×4): 1 via ORAL
  Filled 2018-11-18 (×3): qty 1

## 2018-11-18 MED ORDER — FOLIC ACID 5 MG/ML IJ SOLN
1.0000 mg | Freq: Every day | INTRAMUSCULAR | Status: DC
  Administered 2018-11-19 – 2018-11-21 (×3): 1 mg via INTRAVENOUS
  Filled 2018-11-18 (×3): qty 0.2

## 2018-11-18 MED ORDER — ACETAMINOPHEN 325 MG PO TABS
650.0000 mg | ORAL_TABLET | Freq: Four times a day (QID) | ORAL | Status: DC | PRN
  Administered 2018-11-20 – 2018-11-21 (×3): 650 mg via ORAL
  Filled 2018-11-18 (×3): qty 2

## 2018-11-18 MED ORDER — PANCRELIPASE (LIP-PROT-AMYL) 12000-38000 UNITS PO CPEP
24000.0000 [IU] | ORAL_CAPSULE | Freq: Three times a day (TID) | ORAL | Status: DC
  Administered 2018-11-19 – 2018-11-20 (×3): 24000 [IU] via ORAL
  Filled 2018-11-18 (×6): qty 2

## 2018-11-18 MED ORDER — ONDANSETRON HCL 4 MG/2ML IJ SOLN: 4.0000 mg | Freq: Four times a day (QID) | INTRAMUSCULAR | Status: DC | PRN

## 2018-11-18 MED ORDER — LORAZEPAM 2 MG/ML IJ SOLN: 2.0000 mg | INTRAMUSCULAR | Status: DC | PRN

## 2018-11-18 NOTE — ED Triage Notes (Signed)
Pt reports 2 weeks of abd pain, generalized weakness and bright red blood in his stools. Pt also reports loss of appetite and weight loss recently. Pt a.o in triage, nad

## 2018-11-18 NOTE — ED Notes (Signed)
Patient transported to CT 

## 2018-11-18 NOTE — ED Provider Notes (Signed)
Received patient at signout from [facility].  Refer to provider note for full history and physical examination.  Briefly, patient is a 58 year old male with history of diarrhea, alcohol abuse, pancreatitis, and drug abuse presenting for evaluation of left-sided abdominal pain for the last 2 to 3 weeks.  Pain radiates to the upper abdomen/right upper quadrant.  He has had some bright red blood per rectum and vomiting.  Lab work shows acutely elevated LFTs compared to labs in December.  Recently seen and evaluated by his gastroenterologist on 11/06/2018.  Reports he continues to drink beer. Awaiting CT scan for further evaluation. Physical Exam  BP 127/67   Pulse (!) 46   Temp (!) 97.5 F (36.4 C) (Oral)   Resp 12   SpO2 100%   Physical Exam Vitals signs and nursing note reviewed.  Constitutional:      General: He is not in acute distress.    Appearance: He is well-developed.  HENT:     Head: Normocephalic and atraumatic.  Eyes:     General:        Right eye: No discharge.        Left eye: No discharge.     Conjunctiva/sclera: Conjunctivae normal.  Neck:     Vascular: No JVD.     Trachea: No tracheal deviation.  Cardiovascular:     Rate and Rhythm: Normal rate.  Pulmonary:     Effort: Pulmonary effort is normal.  Abdominal:     General: Abdomen is flat. Bowel sounds are increased. There is no distension.     Tenderness: There is abdominal tenderness in the right upper quadrant, epigastric area and left upper quadrant. There is guarding. There is no rebound. Negative signs include Murphy's sign.  Skin:    Findings: No erythema.  Neurological:     Mental Status: He is alert.  Psychiatric:        Behavior: Behavior normal.     ED Course/Procedures   Clinical Course as of Nov 18 1822  Wynelle Link Nov 18, 2018  1452 Elevated liver enzymes likely due to chronic alcohol abuse. Patient states he is aware of elevated liver enzymes.  AST(!): 222 [AH]  1531 Fecal occult blood test is  negative.  POC occult blood, ED [AH]  1531 WBCs are within normal limits.  WBC: 5.0 [AH]    Clinical Course User Index [AH] Leretha Dykes, PA-C    Procedures  MDM  Imaging shows persistently dilated pancreatic duct with associated pancreatic parenchymal atrophy and a known 7 mm stone within the main pancreatic duct near the ampulla.  However, it does show market distention of the gallbladder.  Given acutely elevated LFTs and persistent symptoms, he would likely benefit from GI work-up inpatient.  Spoke with Dr. Myrtie Neither with gastroenterology who will see the patient in consultation and recommends hospitalist admission.  Internal medicine teaching service to admit.      Jeanie Sewer, PA-C 11/18/18 Marlow Baars, MD 11/18/18 [phone]

## 2018-11-18 NOTE — Discharge Instructions (Addendum)
Samuel [name], This nice meeting you.  You were hospitalized for a recurrent pancreatitis.  The gastroenterologist had evaluated you and he would like to follow-up with you outpatient. Please continue to take your Creon at home. [facility]. It is important for you to stop drinking alcohol, this is what is likely causing your recurrent pancreatitis. Please follow up with your PCP in 1-2 weeks and follow up with the Gastroenterologist. We have also arranged for home health physical therapy to help with her strength.  We did an MRI that showed some atrophy of your cerebellum, this is likely related to your alcohol use which is also why stopping drinking is important.   You have been seen today for abdominal pain and rectal bleeding. Please read and follow all provided instructions.   1. Medications: usual home medications 2. Treatment: rest, drink plenty of fluids 3. Follow Up: Please follow up with your primary doctor in 2 days for discussion of your diagnoses and further evaluation after today's visit; if you do not have a primary care doctor use the resource guide provided to find one; Please return to the ER for any new or worsening symptoms. Please obtain all of your results from medical records or have your doctors office obtain the results - share them with your doctor - you should be seen at your doctors office. Call today to arrange your follow up. Please also follow up with Gastroenterologist if symptoms continue.   Take medications as prescribed. Please review all of the medicines and only take them if you do not have an allergy to them. Return to the emergency room for worsening condition or new concerning symptoms. Follow up with your regular doctor. If you don't have a regular doctor use one of the numbers below to establish a primary care doctor.  Please be aware that if you are taking birth control pills, taking other prescriptions, ESPECIALLY ANTIBIOTICS may make the  birth control ineffective - if this is the case, either do not engage in sexual activity or use alternative methods of birth control such as condoms until you have finished the medicine and your family doctor says it is OK to restart them. If you are on a blood thinner such as COUMADIN, be aware that any other medicine that you take may cause the coumadin to either work too much, or not enough - you should have your coumadin level rechecked in next 7 days if this is the case.  ?  It is also a possibility that you have an allergic reaction to any of the medicines that you have been prescribed - Everybody reacts differently to medications and while MOST people have no trouble with most medicines, you may have a reaction such as nausea, vomiting, rash, swelling, shortness of breath. If this is the case, please stop taking the medicine immediately and contact your physician.  ?  You should return to the ER if you develop severe or worsening symptoms.   Emergency Department Resource Guide 1) Find a Doctor and Pay Out of Pocket Although you won't have to find out who is covered by your insurance plan, it is a good idea to ask around and get recommendations. You will then need to call the office and see if the doctor you have chosen will accept you as a new patient and what types of options they offer for patients who are self-pay. Some doctors offer discounts or will set up payment plans for their patients  who do not have insurance, but you will need to ask so you aren't surprised when you get to your appointment.  2) Contact Your Local Health Department Not all health departments have doctors that can see patients for sick visits, but many do, so it is worth a call to see if yours does. If you don't know where your local health department is, you can check in your phone book. The CDC also has a tool to help you locate your state's health department, and many state websites also have listings of all of their  local health departments.  3) Find a Walk-in Clinic If your illness is not likely to be very severe or complicated, you may want to try a walk in clinic. These are popping up all over the country in pharmacies, drugstores, and shopping centers. They're usually staffed by nurse practitioners or physician assistants that have been trained to treat common illnesses and complaints. They're usually fairly quick and inexpensive. However, if you have serious medical issues or chronic medical problems, these are probably not your best option.  No Primary Care Doctor: - Call Health Connect at  [phone] - they can help you locate a primary care doctor that  accepts your insurance, provides certain services, etc. - Physician Referral [phone]- [phone]  Emergency Department Resource Guide 1) Find a Doctor and Pay Out of Pocket Although you won't have to find out who is covered by your insurance plan, it is a good idea to ask around and get recommendations. You will then need to call the office and see if the doctor you have chosen will accept you as a new patient and what types of options they offer for patients who are self-pay. Some doctors offer discounts or will set up payment plans for their patients who do not have insurance, but you will need to ask so you aren't surprised when you get to your appointment.  2) Contact Your Local Health Department Not all health departments have doctors that can see patients for sick visits, but many do, so it is worth a call to see if yours does. If you don't know where your local health department is, you can check in your phone book. The CDC also has a tool to help you locate your state's health department, and many state websites also have listings of all of their local health departments.  3) Find a Walk-in Clinic If your illness is not likely to be very severe or complicated, you may want to try a walk in clinic. These are popping up all over the country in  pharmacies, drugstores, and shopping centers. They're usually staffed by nurse practitioners or physician assistants that have been trained to treat common illnesses and complaints. They're usually fairly quick and inexpensive. However, if you have serious medical issues or chronic medical problems, these are probably not your best option.  No Primary Care Doctor: - Call Health Connect at  [phone] - they can help you locate a primary care doctor that  accepts your insurance, provides certain services, etc. - Physician Referral Service- [phone]  Chronic Pain Problems: Organization         Address  Phone   Notes  [facility]  [phone] Patients need to be referred by their primary care doctor.   Medication Assistance: Organization         Address  Phone   Notes  [facility]ication [facility] [address] [place], [place]  [id number] [phone] --Must be a resident of [place] [facility] [facility] -- Must have NO insurance coverage whatsoever (no Medicaid/ Medicare, etc.) -- The pt. MUST have a primary care doctor that directs their care regularly and follows them in the community   Med[facility]ist  [phone]   [employer]  [phone]    Agencies that provide inexpensive medical care: Organization         Address  Phone   Notes  [facility]  [phone]   [facility]    [phone]   [facility] [address] [place], [place] [address] [phone]   [facility] [place] [address], [place] [phone]   [facility]    [phone]) [phone]   [facility]    [phone]) [phone]   [facility]  [address], [place] Phone:  [phone], Fax:  [phone]) [phone] Hours of Operation:  9 am - 6 pm, M-F.  Also accepts Medicaid/Medicare and self-pay.  [facility]  [address],  [place] Phone: [phone], Fax: [phone]) [phone]. Hours of Operation:  8:30 am - 5:30 pm, M-F.  Also accepts Medicaid and self-pay.  [facility] [place] [address], [place] Phone: [phone]) [phone]   [facility] [address], [place], [place] [phone], Ext. [phone] Mondays & Thursdays: 7-9 AM.  First 15 patients are seen on a first come, first serve basis.    Medicaid-accepting [facility] Providers:  Organization         Address  Phone   Notes  [facility] [address], [place] [phone] Also accepts self-pay patients.  [facility] [address], [place]  [phone]   [facility] [address], [place] [phone]) [phone]   [facility] [address] [place] [address], [place] [phone]   Renaye RakersVeita Bland [address], [place]   [phone] Only accepts [place] patients after they have their name applied to their card.   Self-Pay (no insurance) in [facility] [place] [facility]:  Organization         Address  Phone   Notes  Sickle Cell Patients, [facility] [address], [place] [phone]   [facility] [address], [place] [phone]   [facility] [place]  [address], [place] [phone]   [facility]/Dr. Osei-Bonsu  [address] [place] [address], [place] or [phone], [place] [phone] Phone number for both [place] and [place] locations is the same.  [facility] [address], [place] [phone]) [phone]   [facility] [address] [place] [address], [place] or [address] [phone]) [phone]   [facility] [address], [place] [phone], phone; [phone]) [phone], fax Sees patients 1st and 3rd Saturday of every month.  Must not  qualify for public or private insurance (i.e. Medicaid, Medicare, [place] Health Choice, Veterans' Benefits)  Household income should be no more than 200% of the poverty level The clinic cannot treat you if you are pregnant or think you are pregnant  Sexually transmitted diseases are not treated at the clinic.

## 2018-11-18 NOTE — ED Notes (Signed)
ADmitting team would like to keep patient SD status for progressive bed.

## 2018-11-18 NOTE — ED Notes (Signed)
Returned from cat scan.

## 2018-11-18 NOTE — H&P (Signed)
Date: 11/18/2018               Patient Name:  Samuel [name] MRN: [id number]  DOB: Jan 09, 1961 Age / Sex: 58 y.o., male   PCP: Scot Jun, FNP         Medical Service: Internal Medicine Teaching Service         Attending Physician: Dr. Sid Falcon, MD    First Contact: Dr. Sherry Ruffing Pager: [phone]  Second Contact: Dr. Shan Levans Pager: [phone]       After Hours (After 5p/  First Contact Pager: [phone]  weekends / holidays): Second Contact Pager: [phone]   Chief Complaint: Abdominal pain, diarrhea  History of Present Illness: This is a 58 year old male with history of chronic diarrhea, alcohol abuse, recurrent pancreatitis who presented with a 3-week history of abdominal pain, melena, generalized weakness, and diarrhea.  He reports that the abdominal pain is in the left lower quadrant, sharp in nature and it is constant but occasionally worsens, he has some radiation to the back, nothing worsens it, aspirin sometimes helps. He started noticed blood in his stools about 3 weeks ago and he reports that this has been getting worse, he denies ever having blood in his stool before. He had one episode of emesis last week that was green and brown in color.  He denies any fevers, headaches, chills, or any sick contacts. Denies any changes in his diet recently.  He reports that this has not happened before.  He does have a history of recurrent pancreatitis for which he takes pancrelipase and follow with Dr. Loletha Carrow.  He reports that he had not been taking these medications.  He drinks 1/2 a 6 pack of beers per day, last drink was 3 days ago.   Of note patient was seen on 12/05 in the ED after a episode of seizure-like activity.  There is concern that this could be secondary to alcohol withdrawal.  Patient was discharged home with recommendations for neurology follow-up.  He has had a history of chronic pancreatitis and alcohol abuse and has had elevated transaminitis in the past, he continues  to drink alcohol on a daily basis.  In the ED patient was noted to be afebrile, RR 18, heart rate 98, BP 150/70.  Pulse dropped down to 46.  FOBT was negative.  CMP showed a hyponatremia of 134, hypokalemia of 3.4, albumin 2.6, AST 222, ALT 112, alk phos 165, T bili 3.8.  CBC showed a anemia with hemoglobin 10.7, platelets 43.  Abdominal CT scan showed markedly distended gallbladder, persistent dilation of the main pancreatic duct, 7 mm stone in the main pancreatic duct. Dr. Loletha Carrow the gastroenterologist was consulted and will see him. Internal medicine was consulted for admission.   Meds:  Scheduled Meds: . [START ON 3/73/4287] folic acid  1 mg Intravenous Daily  . [START ON 11/19/2018] lipase/protease/amylase  24,000 Units Oral TID AC  . multivitamin with minerals  1 tablet Oral Daily  . [START ON 11/19/2018] thiamine injection  100 mg Intravenous Daily   Continuous Infusions: . banana bag IV 1000 mL     PRN Meds:.acetaminophen **OR** acetaminophen, LORazepam  No outpatient medications have been marked as taking for the 11/18/18 encounter [facility] Encounter).     Allergies: Allergies as of 11/18/2018  . (No Known Allergies)   Past Medical History:  Diagnosis Date  . Elevated LFTs   . Pancreatitis     Family History: Diabetes in brother, mother, and cousins.  Denied any GI diseases.  Family History  Problem Relation Age of Onset  . Breast cancer Mother   . Pancreatitis Neg Hx   . Liver disease Neg Hx   . Colon cancer Neg Hx   . Stomach cancer Neg Hx   . Esophageal cancer Neg Hx    Social History: Drinks a half of a 6 pack/day of beer, last drink was 3 days ago, denies any history of withdrawal or hospitalizations.  He smokes 1/2 pack/day, and has tried quitting but only quit for a few months before going back.  Denies any drug use.  He lives with his mother and works at [employer].  Review of Systems: A complete ROS was negative except as per HPI.   Physical Exam: Blood  pressure 127/67, pulse (!) 46, temperature (!) 97.5 F (36.4 C), temperature source Oral, resp. rate 12, SpO2 100 %. Physical Exam  Constitutional: He is oriented to person, place, and time.  Thin male, NAD, resting comfortably in bed  HENT:  Head: Normocephalic and atraumatic.  Eyes: Pupils are equal, round, and reactive to light. Conjunctivae and EOM are normal.  Neck: Normal range of motion. Neck supple. No thyromegaly present.  Cardiovascular: Normal rate, regular rhythm and normal heart sounds.  Pulmonary/Chest: Effort normal and breath sounds normal. No respiratory distress.  Abdominal: Bowel sounds are normal. He exhibits no mass. There is abdominal tenderness (Diffuse, worse in LLQ). There is guarding. There is no rebound.  No murphy's sign  Musculoskeletal: Normal range of motion.        General: No edema.  Neurological: He is alert and oriented to person, place, and time.  Skin: Skin is warm and dry.  Psychiatric: Mood and affect normal.   Assessment & Plan by Problem: Active Problems:   Transaminitis   Pancreatic duct dilated  Abdominal pain: Pancreatic duct dilation: Melena: -Patient presented with diffuse abdominal pain that is worse in the LLQ, radiates to the back, associated with anorexia, nausea,, melena, and generalized weakness.  He was found to be hypertensive and initially tachycardic.  Labs are remarkable for an elevated AST to 222, ALT 11/10/2010, alk phos to 165, and elevated T bili to 3.8. CT scan showed markedly distended gallbladder, persistent dilation of communicating duct with no inflammatory changes, 7 mm stone in the main pancreatic duct.  Patient will likely need an MRCP to evaluate for obstruction, he may need ERCP. FOBT was negative, unclear if he is having bloody bowel movements but we will continue to monitor.  -MRCP -Gastroenterology following, appreciate recommendations -Continue Zofran PRN -CBC and CMP in a.m. -N.p.o. at this time -Continue  maintenance fluids -Tylenol PRN for pain  History of recurrent pancreatitis: Patient has a history of pancreatitis and follows with the gastroenterologist Dr. Loletha Carrow for this. He is prescribed pancrelipase TID with meals and Imodium for diarrhea however he has been taking these medications. 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  He drinks half a 6 pack/day.  He denied any history of withdrawal however has had a seizure that may have been related to this. -CIWA protocol with Ativan -Multivitamin, thiamine, and folic acid  Thrombocytopenia: Patient's platelets are 46 on admission.  He has been having episodes of melena, his last episode was yesterday.  Hemoglobin has been stable.  This may be related to his liver disease and we will continue  to monitor. -CMP in AM -Monitor for bleeding  FEN: 500 cc NS bolus, replete lytes prn, NPO VTE ppx: Held due to thrombocytopenia Code Status: FULL   Dispo: Admit patient to Inpatient with expected length of stay greater than 2 midnights.  Signed: Asencion Noble, MD 11/18/2018, 6:30 PM  Pager: [phone]

## 2018-11-18 NOTE — ED Provider Notes (Addendum)
[facility] Note   CSN: [id number] Arrival date & time: 11/18/18  1339     History   Chief Complaint Chief Complaint  Patient presents with  . Rectal Bleeding  . Abdominal Pain  . Weakness    HPI Samuel [name] is a 58 y.o. male with a PMH of chronic diarrhea, alcohol abuse, acute pancreatitis, and drug abuse presents with constant left sided abdominal pain and diarrhea onset 2-3 weeks ago. Friend is a contributing historian. Nothing makes symptoms better or worse. Patient reports generalized weakness. Patient reports loss of appetite and weight loss. Patient reports he saw [facility] on 11/06/2018 and advised to continue pancreatitic enzymes and imodium as needed. Patient reports he has not been taking medications. Patient reports spotting of bright red blood on toilet tissue and stool. Patient reports an episode of non bloody vomiting 1 week ago. Last CT abdomen was 09/11/18 and it revealed diffuse dilatation of the main pancreatic duct with moderate atrophy of the gland. Patient denies fever, chest pain or shortness of breath. Patient reports daily alcohol use, but denies any alcohol use today. Patient denies previous surgeries.  HPI  Past Medical History:  Diagnosis Date  . Elevated LFTs   . Pancreatitis     Patient Active Problem List   Diagnosis Date Noted  . Transaminitis 09/11/2018  . HYPOKALEMIA 07/28/2010  . TOBACCO ABUSE 07/28/2010  . COCAINE ABUSE 07/28/2010  . NEUROPATHY 07/28/2010  . HYPONATREMIA 07/12/2010  . Alcohol abuse 07/12/2010  . Acute pancreatitis 07/12/2010    Past Surgical History:  Procedure Laterality Date  . NO PAST SURGERIES          Home Medications    Prior to Admission medications   Medication Sig Start Date End Date Taking? Authorizing Provider  Albuterol Sulfate 108 (90 Base) MCG/ACT AEPB Inhale 2 puffs into the lungs every 4 (four) hours as needed. 10/04/18   Bing NeighborsHarris, Kimberly S, FNP   aspirin 325 MG tablet Take 325 mg by mouth every 6 (six) hours as needed for mild pain or headache.     [provider]  Pancrelipase, Lip-Prot-Amyl, 24000-76000 units CPEP Take 3 capsules (72,000 Units total) by mouth 3 (three) times daily before meals. 11/06/18   Sherrilyn Ristanis, Henry L III, MD    Family History Family History  Problem Relation Age of Onset  . Breast cancer Mother   . Pancreatitis Neg Hx   . Liver disease Neg Hx   . Colon cancer Neg Hx   . Stomach cancer Neg Hx   . Esophageal cancer Neg Hx     Social History Social History   Tobacco Use  . Smoking status: Current Every Day Smoker    Packs/day: 1.00    Years: 45.00    Pack years: 45.00    Types: Cigarettes  . Smokeless tobacco: Never Used  Substance Use Topics  . Alcohol use: Yes    Alcohol/week: 6.0 standard drinks    Types: 6 Cans of beer per week    Comment: DAILY  . Drug use: Not Currently     Allergies   Patient has no known allergies.   Review of Systems Review of Systems  Constitutional: Positive for appetite change and unexpected weight change. Negative for activity change, chills and fever.  HENT: Negative for congestion, rhinorrhea and sore throat.   Eyes: Negative for visual disturbance.  Respiratory: Negative for cough and shortness of breath.   Cardiovascular: Negative for chest pain.  Gastrointestinal: Positive for abdominal pain, blood in stool, diarrhea and vomiting. Negative for constipation and rectal pain.  Endocrine: Negative for polydipsia, polyphagia and polyuria.  Genitourinary: Negative for dysuria, flank pain and frequency.  Musculoskeletal: Negative for back pain and gait problem.  Skin: Negative for rash.  Neurological: Positive for weakness and light-headedness. Negative for dizziness, syncope, facial asymmetry, speech difficulty and headaches.  Psychiatric/Behavioral: Negative for confusion. The patient is not nervous/anxious.    Physical Exam Updated Vital  Signs BP 124/83 (BP Location: Left Arm)   Pulse 71   Temp (!) 97.5 F (36.4 C) (Oral)   Resp 12   SpO2 100%   Physical Exam Vitals signs and nursing note reviewed. Exam conducted with a chaperone present.  Constitutional:      General: He is not in acute distress.    Appearance: He is well-developed. He is not diaphoretic.  HENT:     Head: Normocephalic and atraumatic.     Mouth/Throat:     Mouth: Mucous membranes are moist.     Pharynx: No oropharyngeal exudate.  Eyes:     Extraocular Movements: Extraocular movements intact.     Pupils: Pupils are equal, round, and reactive to light.  Neck:     Musculoskeletal: Normal range of motion and neck supple.  Cardiovascular:     Rate and Rhythm: Normal rate and regular rhythm.     Heart sounds: Normal heart sounds. No murmur. No friction rub. No gallop.   Pulmonary:     Effort: Pulmonary effort is normal. No respiratory distress.     Breath sounds: Normal breath sounds. No wheezing or rales.  Abdominal:     General: Abdomen is flat. Bowel sounds are normal. There is no distension.     Palpations: Abdomen is soft. Abdomen is not rigid. There is no mass.     Tenderness: There is abdominal tenderness in the left upper quadrant and left lower quadrant. There is guarding. There is no right CVA tenderness, left CVA tenderness or rebound.     Hernia: No hernia is present.  Genitourinary:    Rectum: Normal. Guaiac result negative. No tenderness, anal fissure or external hemorrhoid. Normal anal tone.  Musculoskeletal: Normal range of motion.  Skin:    Findings: No rash.  Neurological:     Mental Status: He is alert and oriented to person, place, and time.    Mental Status:  Alert, oriented, thought content appropriate, able to give a coherent history. Speech fluent without evidence of aphasia. Able to follow 2 step commands without difficulty.  Cranial Nerves:  II:  Peripheral visual fields grossly normal, pupils equal, round, reactive  to light III,IV, VI: ptosis not present, extra-ocular motions intact bilaterally  V,VII: smile symmetric, facial light touch sensation equal VIII: hearing grossly normal to voice  X: uvula elevates symmetrically  XI: bilateral shoulder shrug symmetric and strong XII: midline tongue extension without fassiculations Motor:  Normal tone. 5/5 in upper and lower extremities bilaterally including strong and equal grip strength and dorsiflexion/plantar flexion Sensory: Pinprick and light touch normal in all extremities.  Deep Tendon Reflexes: 2+ and symmetric in the biceps and patella Cerebellar: normal finger-to-nose with bilateral upper extremities Gait: normal gait and balance.  CV: distal pulses palpable throughout   ED Treatments / Results  Labs (all labs ordered are listed, but only abnormal results are displayed) Labs Reviewed  COMPREHENSIVE METABOLIC PANEL - Abnormal; Notable for the following components:      Result Value   Sodium  134 (*)    Potassium 3.4 (*)    Glucose, Bld 130 (*)    BUN <5 (*)    Creatinine, Ser 0.60 (*)    Calcium 8.2 (*)    Albumin 2.6 (*)    AST 222 (*)    ALT 112 (*)    Alkaline Phosphatase 165 (*)    Total Bilirubin 3.8 (*)    All other components within normal limits  CBC - Abnormal; Notable for the following components:   RBC 3.41 (*)    Hemoglobin 10.7 (*)    HCT 30.1 (*)    Platelets 43 (*)    All other components within normal limits  LIPASE, BLOOD  POC OCCULT BLOOD, ED  TYPE AND SCREEN  ABO/RH   Hemoglobin  Date Value Ref Range Status  11/18/2018 10.7 (L) 13.0 - 17.0 g/dL Final  30/16/0109 32.3 (L) 13.0 - 17.7 g/dL Final  55/73/2202 54.2 (L) 13.0 - 17.0 g/dL Final  70/62/3762 83.1 (L) 13.0 - 17.0 g/dL Final  51/76/1607 37.1 (L) 13.0 - 17.0 g/dL Final   EKG None  Radiology No results found.  Procedures Procedures (including critical care time)  Medications Ordered in ED Medications  sodium chloride 0.9 % bolus 500 mL (has  no administration in time range)  iohexol (OMNIPAQUE) 300 MG/ML solution 100 mL (100 mLs Intravenous Contrast Given 11/18/18 1540)     Initial Impression / Assessment and Plan / ED Course  I have reviewed the triage vital signs and the nursing notes.  Pertinent labs & imaging results that were available during my care of the patient were reviewed by me and considered in my medical decision making (see chart for details).  Clinical Course as of Nov 19 1551  Wynelle Link Nov 18, 2018  1452 Elevated liver enzymes likely due to chronic alcohol abuse. Patient states he is aware of elevated liver enzymes.  AST(!): 222 [AH]  1531 Fecal occult blood test is negative.  POC occult blood, ED [AH]  1531 WBCs are within normal limits.  WBC: 5.0 [AH]    Clinical Course User Index [AH] Leretha Dykes, PA-C   Patient presents with left sided abdominal pain and diarrhea. Fecal occult blood test is negative and rectal exam does not reveal abnormalities. WBCs are within normal limits. Hemoglobin is 10.7. Slightly lower than baseline. No indications for transfusion at this time. Neurological exam is normal. Suspect weakness may be due to dehydration. Fluid bolus ordered. Concern for diverticulitis. Lipase is pending. CT abdomen is pending.   At shift change care was transferred to [facility], PA-C who will follow pending studies, re-evaluate and determine disposition.    Final Clinical Impressions(s) / ED Diagnoses   Final diagnoses:  Left lower quadrant abdominal pain  Rectal bleeding    ED Discharge Orders    None        Leretha Dykes, [place] 11/18/18 1617    Linwood Dibbles, MD 11/20/18 1244

## 2018-11-19 ENCOUNTER — Other Ambulatory Visit: Payer: Self-pay

## 2018-11-19 ENCOUNTER — Encounter (HOSPITAL_COMMUNITY): Payer: Self-pay

## 2018-11-19 DIAGNOSIS — K709 Alcoholic liver disease, unspecified: Secondary | ICD-10-CM

## 2018-11-19 DIAGNOSIS — K8689 Other specified diseases of pancreas: Secondary | ICD-10-CM | POA: Diagnosis not present

## 2018-11-19 DIAGNOSIS — K860 Alcohol-induced chronic pancreatitis: Secondary | ICD-10-CM | POA: Diagnosis not present

## 2018-11-19 DIAGNOSIS — R945 Abnormal results of liver function studies: Secondary | ICD-10-CM | POA: Diagnosis not present

## 2018-11-19 DIAGNOSIS — K8050 Calculus of bile duct without cholangitis or cholecystitis without obstruction: Secondary | ICD-10-CM | POA: Diagnosis not present

## 2018-11-19 DIAGNOSIS — R109 Unspecified abdominal pain: Secondary | ICD-10-CM | POA: Diagnosis not present

## 2018-11-19 DIAGNOSIS — K861 Other chronic pancreatitis: Secondary | ICD-10-CM | POA: Diagnosis not present

## 2018-11-19 DIAGNOSIS — R7989 Other specified abnormal findings of blood chemistry: Secondary | ICD-10-CM | POA: Insufficient documentation

## 2018-11-19 LAB — COMPREHENSIVE METABOLIC PANEL
ALT: 85 U/L — ABNORMAL HIGH (ref 0–44)
AST: 158 U/L — ABNORMAL HIGH (ref 15–41)
Albumin: 2.3 g/dL — ABNORMAL LOW (ref 3.5–5.0)
Alkaline Phosphatase: 125 U/L (ref 38–126)
Anion gap: 10 (ref 5–15)
BUN: <5 mg/dL — ABNORMAL LOW (ref 6–20)
CO2: 23 mmol/L (ref 22–32)
CREATININE: 0.58 mg/dL — AB (ref 0.61–1.24)
Calcium: 7.8 mg/dL — ABNORMAL LOW (ref 8.9–10.3)
Chloride: 101 mmol/L (ref 98–111)
GFR calc Af Amer: >60 mL/min (ref >60)
GFR calc non Af Amer: >60 mL/min (ref >60)
Glucose, Bld: 111 mg/dL — ABNORMAL HIGH (ref 70–99)
Potassium: 3.2 mmol/L — ABNORMAL LOW (ref 3.5–5.1)
Sodium: 134 mmol/L — ABNORMAL LOW (ref 135–145)
TOTAL PROTEIN: 5.7 g/dL — AB (ref 6.5–8.1)
Total Bilirubin: 3.5 mg/dL — ABNORMAL HIGH (ref 0.3–1.2)

## 2018-11-19 LAB — CBC
HCT: 27.5 % — ABNORMAL LOW (ref 39.0–52.0)
Hemoglobin: 9.7 g/dL — ABNORMAL LOW (ref 13.0–17.0)
MCH: 31.7 pg (ref 26.0–34.0)
MCHC: 35.3 g/dL (ref 30.0–36.0)
MCV: 89.9 fL (ref 80.0–100.0)
PLATELETS: 44 10*3/uL — AB (ref 150–400)
RBC: 3.06 MIL/uL — ABNORMAL LOW (ref 4.22–5.81)
RDW: 14.6 % (ref 11.5–15.5)
WBC: 4.5 10*3/uL (ref 4.0–10.5)
nRBC: 0 % (ref 0.0–0.2)

## 2018-11-19 LAB — URINALYSIS, ROUTINE W REFLEX MICROSCOPIC
Bilirubin Urine: NEGATIVE
Glucose, UA: NEGATIVE mg/dL
Hgb urine dipstick: NEGATIVE
Ketones, ur: 20 mg/dL — AB
Leukocytes, UA: NEGATIVE
Nitrite: NEGATIVE
Protein, ur: NEGATIVE mg/dL
Specific Gravity, Urine: 1.009 (ref 1.005–1.030)
pH: 7 (ref 5.0–8.0)

## 2018-11-19 LAB — MRSA PCR SCREENING: MRSA by PCR: NEGATIVE

## 2018-11-19 MED ORDER — DEXTROSE IN LACTATED RINGERS 5 % IV SOLN
INTRAVENOUS | Status: AC
  Administered 2018-11-19: 75 mL/h via INTRAVENOUS

## 2018-11-19 MED ORDER — POTASSIUM CHLORIDE 10 MEQ/100ML IV SOLN
10.0000 meq | INTRAVENOUS | Status: AC
  Administered 2018-11-19 (×3): 10 meq via INTRAVENOUS
  Filled 2018-11-19 (×4): qty 100

## 2018-11-19 MED ORDER — MAGNESIUM SULFATE 2 GM/50ML IV SOLN
2.0000 g | Freq: Once | INTRAVENOUS | Status: AC
  Administered 2018-11-19: 2 g via INTRAVENOUS
  Filled 2018-11-19: qty 50

## 2018-11-19 MED ORDER — POTASSIUM CHLORIDE 10 MEQ/100ML IV SOLN
10.0000 meq | INTRAVENOUS | Status: AC
  Administered 2018-11-19 (×3): 10 meq via INTRAVENOUS
  Filled 2018-11-19 (×3): qty 100

## 2018-11-19 NOTE — Progress Notes (Signed)
Subjective:  Patient reports that his abdominal pain is unchanged since he presented to the ED.  He is not nauseous and is requesting food, although still NPO for GI evaluation  He had one bowel movement with yellow-green stool denied any blood in stool.  Was not able to sleep well last night due to being in stretcher in ED hallway. All questions were answered.   Objective:  Vital signs in last 24 hours: Vitals:   11/19/18 0439 11/19/18 0954 11/19/18 0956 11/19/18 1057  BP: (!) 116/52 117/68 117/65 122/64  Pulse: (!) 57 (!) 49 (!) 49 (!) 51  Resp: 16  18 18   Temp: 98.5 F (36.9 C)     TempSrc:      SpO2: 97%  97% 97%   Gen: well-appearing, lying in bed, no acute distress CV: regular rate and rhythm without murmurs Pulm: breathing comfortably on room air, lungs clear bilaterally Abd: soft and non-distended, diffusely tender with maximum tenderness at LLQ, hyperactive bowel sounds present, guarding, no rebound tenderness Ext: 4x distal pulses palpable and symmetric, no lower extremity swelling Neuro: alert and oriented, cooperative with exam  Assessment/Plan:  Samuel [name] is a 58 year old man with a history of alcohol abuse and recurrent acute pancreatitis who presented with several weeks of abdominal pain, nausea, diarrhea, generalized weakness, and possible melenic stools.  Found to have a 7 mm gallstone in the pancreatic duct and 2:1 transaminitis.  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  Active Problems:   Transaminitis   Pancreatic duct dilated  Abdominal pain: Dilated pancreatic duct with stone: Recurrent pancreatitis: Has history of recurrent attacks and has not taken his pancreatic enzyme replacement for unspecified length of time in setting of continued alcohol abuse. He does not have an acute abdomen on exam. CT scan showed markedly distended gallbladder, persistent  dilation of communicating duct with no inflammatory changes, 7 mm stone in the main pancreatic duct. MRCP shows calcific pancreas, and dilated pancreatic duct with stone suggesting chronic obstruction which likely explains pain.  He may require intervention on the stone pending recommendations from GI.  - Gastroenterology has been consulted, appreciate recommendations - continue home pancreatic enzyme replacement - May advanced diet to clear liquids -Continue zofran PRN -Continue tylenol PRN -CBC and CMP in AM -Continue home Creon -Continue imodium PRN  Alcohol abuse Alcoholic liver disease: Last drink 3 days prior to admission.  Hospitalized in early December with seizures likely due to withdrawal, although he denies history of withdrawal.  Labs show 2:1 transaminitis with total bilirubin 3.8, possible findings of cirrhosis on MRCP.  He is not showing signs of withdrawal on exam this morning. - CIWA with lorazepam PRN - thiamine 100 mg daily - multivitamin and folic acid  ? Melenic stools Thrombocytopenia: Platelets 44 on admission, consistent with stable thrombocytopenia identified on prior blood work.  HIV and hepatitis panel negative in December.  Likely secondary to chronic liver disease and alcohol use.  He did give history of melenic stools but FOBT negative. He denies any other sources of bleeding. Hgb has been stable.  - monitor for bleeds - follow daily CBC   Electrolyte abnormalities: -K and mag low today. Replete as needed.  -Recheck labs in AM  FEN: D5LR @ 75 mL/hr, replete electrolytes PRN, CLD VTE ppx: SCDs Code status: full code  Dispo: Anticipated discharge in approximately 2-3 days.  Tilda BurrowKerner, David S, Medical Student 11/19/2018, 11:17 AM   Attestation for  Student Documentation:  I personally was present and performed or re-performed the history, physical exam and medical decision-making activities of this service and have verified that the service and findings  are accurately documented in the student's note.  Claudean Severance, MD 11/19/2018, 1:52 PM

## 2018-11-19 NOTE — ED Notes (Signed)
Dr. Samuella Cota again contacted about pt request for pain meds. States pt has SB and all meds could suppress hr. Tylenol PR offered and pt accepts.

## 2018-11-19 NOTE — Progress Notes (Signed)
11/19/2018 Patient transfer from the emergency room to 2W at 1545. Patient is alert, oriented and ambulatory. Patient skin assess, no breakdown, but bilateral feet dry. Patient was placed on stepdown monitor, receive CHG bath and MRSA swab. It was reported by the emergency nurse patient heart rate in the 40's. Patient heart rate continue in the 40's and 50. Patient continue to be asymptomatic. [facility] RN.

## 2018-11-19 NOTE — ED Notes (Signed)
Pt given po fluids and tolerating well. 

## 2018-11-19 NOTE — ED Notes (Signed)
Pt oob to bathroom with steady gait. 

## 2018-11-19 NOTE — Consult Note (Addendum)
Consultation  Referring Provider: Dr. Daryll Drown      Primary Care Physician:  Scot Jun, FNP Primary Gastroenterologist:  Dr. Henrene Pastor       Reason for Consultation: Abdominal Pain, Elevated LFT's              HPI:   Samuel [name] is a 58 y.o. male with a past medical history as listed below including elevated LFTs and chronic pancreatitis in the setting of alcoholism, who presented to the ER 11/18/2018 with a complaint of abdominal pain and diarrhea.    Today, the patient is a very poor historian, he is accompanied by his family member who does provide some of his history.  Tells me he has continued with diarrhea "multiple times a day" even though he is taking his pancreatic enzymes per him.  Also continues with a generalized weakness and abdominal pain more in his upper abdomen over the past 3 weeks but some worse over the past few days.  This pain has been so bad that he has not been eating for the past week per him.  This is why he presented to the ER.  Some vague history of black stools, though per family, he was told he did not have these anymore in the ER.  Associated symptoms include weight loss.    Patient does not admit to drinking alcohol recently but his family member tells me he has at least 2 beers a day.  Per previous history reported drinking a half of a sixpack of beers per day.    Denies fever, chills, nausea, vomiting or symptoms that awaken him from sleep.  ED Course: In the ED patient was noted to be afebrile, RR 18, heart rate 98, BP 150/70.  Pulse dropped down to 46.  FOBT was negative.  CMP showed a hyponatremia of 134, hypokalemia of 3.4, albumin 2.6, AST 222, ALT 112, alk phos 165, T bili 3.8.  CBC showed a anemia with hemoglobin 10.7, platelets 43.  Abdominal CT scan showed markedly distended gallbladder, persistent dilation of the main pancreatic duct, 7 mm stone in the main pancreatic duct.   Recent GI history: 11/06/2018 office visit Dr. Loletha Carrow: At that time  discussed patient recently admitted to the hospital on December 2 with abdominal pain history of alcohol drug abuse with chronic pancreatitis.  Imaging revealed changes of chronic calcific pancreatitis, suspected to be the cause of chronic diarrhea.  Had significant elevation of transaminases thought to be combination of alcohol and some other unknown toxin from a cold remedy he was taking in addition CT scan showed pancreatic duct stone with proximal ductal dilation.  Dr. Henrene Pastor did not feel required to intervene at that time.  He was back in the emergency department on December 5 for seizure, had a primary care office follow-up on dec 26 and back in the ED the week before office visit for abdominal pain.  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  Advised to follow-up with Dr. Henrene Pastor in 4 to 6 weeks.  It was thought that if he had continued symptoms of malabsorption despite adequate dosing pancreatic enzymes he may need intervention on his pancreatic duct stone  Past Medical History:  Diagnosis Date  . Elevated LFTs   . Pancreatitis  Past Surgical History:  Procedure Laterality Date  . NO PAST SURGERIES      Family History  Problem Relation Age of Onset  . Breast cancer Mother   . Pancreatitis Neg Hx   . Liver disease Neg Hx   . Colon cancer Neg Hx   . Stomach cancer Neg Hx   . Esophageal cancer Neg Hx     Social History   Tobacco Use  . Smoking status: Current Every Day Smoker    Packs/day: 1.00    Years: 45.00    Pack years: 45.00    Types: Cigarettes  . Smokeless tobacco: Never Used  Substance Use Topics  . Alcohol use: Yes    Alcohol/week: 6.0 standard drinks    Types: 6 Cans of beer per week    Comment: DAILY  . Drug use: Not Currently     Prior to Admission medications   Medication Sig Start Date End Date Taking? Authorizing Provider  Albuterol Sulfate 108 (90 Base) MCG/ACT AEPB Inhale 2 puffs into the lungs every 4 (four) hours as needed. Patient taking differently: Inhale 2 puffs into the lungs every 4 (four) hours as needed (shortness of breath).  10/04/18  Yes Scot Jun, FNP  aspirin 325 MG tablet Take 325 mg by mouth every 6 (six) hours as needed for mild pain or headache.    Yes [provider]  Pancrelipase, Lip-Prot-Amyl, 24000-76000 units CPEP Take 3 capsules (72,000 Units total) by mouth 3 (three) times daily before meals. 11/06/18  Yes Danis, Kirke Corin, MD    Current Facility-Administered Medications  Medication Dose Route Frequency Provider Last Rate Last Dose  . acetaminophen (TYLENOL) tablet 650 mg  650 mg Oral Q6H PRN Katherine Roan, MD       Or  . acetaminophen (TYLENOL) suppository 650 mg  650 mg Rectal Q6H PRN Katherine Roan, MD      . dextrose 5 % in lactated ringers infusion   Intravenous Continuous Winfrey, Jenne Pane, MD      . folic acid injection 1 mg  1 mg Intravenous Daily Katherine Roan, MD   1 mg at 11/19/18 1034  . lipase/protease/amylase (CREON) capsule 24,000 Units  24,000 Units Oral TID AC Katherine Roan, MD      . LORazepam (ATIVAN) injection 2-3 mg  2-3 mg Intravenous Q1H PRN Katherine Roan, MD      . multivitamin with minerals tablet 1 tablet  1 tablet Oral Daily Katherine Roan, MD   1 tablet at 11/18/18 2345  . ondansetron (ZOFRAN) injection 4 mg  4 mg Intravenous Q6H PRN Guadlupe Spanish B, MD      . potassium chloride 10 mEq in 100 mL IVPB  10 mEq Intravenous Q1 Hr x 6 Sherry Ruffing, Marissa M, MD 100 mL/hr at 11/19/18 1130 10 mEq at 11/19/18 1130  . thiamine (B-1) injection 100 mg  100 mg Intravenous Daily Katherine Roan, MD   100 mg at 11/19/18 1023   Current Outpatient Medications  Medication Sig Dispense Refill  . Albuterol Sulfate 108 (90  Base) MCG/ACT AEPB Inhale 2 puffs into the lungs every 4 (four) hours as needed. (Patient taking differently: Inhale 2 puffs into the lungs every 4 (four) hours as needed (shortness of breath). ) 1 each 1  . aspirin 325 MG tablet Take 325 mg by mouth every 6 (six) hours as needed for mild pain or headache.     . Pancrelipase, Lip-Prot-Amyl, 24000-76000 units CPEP  Take 3 capsules (72,000 Units total) by mouth 3 (three) times daily before meals. 270 capsule 1    Allergies as of 11/18/2018  . (No Known Allergies)     Review of Systems:    Constitutional: No fever or chills Skin: No rash Cardiovascular: No chest pain Respiratory: No SOB  Gastrointestinal: See HPI and otherwise negative Genitourinary: No dysuria  Neurological: No headache, dizziness or syncope Musculoskeletal: No new muscle or joint pain Hematologic: No bleeding  Psychiatric: No history of depression or anxiety    Physical Exam:  Vital signs in last 24 hours: Temp:  [97.5 F (36.4 C)-98.5 F (36.9 C)] 98.5 F (36.9 C) (02/10 0439) Pulse Rate:  [44-71] 51 (02/10 1057) Resp:  [12-18] 18 (02/10 1057) BP: (116-131)/(52-83) 122/64 (02/10 1057) SpO2:  [97 %-100 %] 97 % (02/10 1057)   General:   Pleasant AA male appears to be in NAD, Well developed, Well nourished, alert and cooperative Head:  Normocephalic and atraumatic. Eyes:   PEERL, EOMI. No icterus. Conjunctiva pink. Ears:  Normal auditory acuity. Neck:  Supple Throat: Oral cavity and pharynx without inflammation, swelling or lesion.  Lungs: Respirations even and unlabored. Lungs clear to auscultation bilaterally.   No wheezes, crackles, or rhonchi.  Heart: Normal S1, S2. No MRG. Regular rate and rhythm. No peripheral edema, cyanosis or pallor.  Abdomen:  Soft, nondistended, mild generalized ttp, No rebound or guarding. Normal bowel sounds. No appreciable masses or hepatomegaly. Rectal:  Not performed.  Msk:  Symmetrical without gross deformities. Peripheral  pulses intact.  Extremities:  Without edema, no deformity or joint abnormality.  Neurologic:  Alert and  oriented x4;  grossly normal neurologically.  Skin:   Dry and intact without significant lesions or rashes. Psychiatric: Demonstrates good judgement and reason without abnormal affect or behaviors.   LAB RESULTS: Recent Labs    11/18/18 1355 11/19/18 0254  WBC 5.0 4.5  HGB 10.7* 9.7*  HCT 30.1* 27.5*  PLT 43* 44*   BMET Recent Labs    11/18/18 1355 11/19/18 0254  NA 134* 134*  K 3.4* 3.2*  CL 99 101  CO2 26 23  GLUCOSE 130* 111*  BUN <5* <5*  CREATININE 0.60* 0.58*  CALCIUM 8.2* 7.8*   Hepatic Function Latest Ref Rng & Units 11/19/2018 11/18/2018 10/04/2018  Total Protein 6.5 - 8.1 g/dL 5.7(L) 6.7 7.6  Albumin 3.5 - 5.0 g/dL 2.3(L) 2.6(L) 4.4  AST 15 - 41 U/L 158(H) 222(H) 45(H)  ALT 0 - 44 U/L 85(H) 112(H) 31  Alk Phosphatase 38 - 126 U/L 125 165(H) 82  Total Bilirubin 0.3 - 1.2 mg/dL 3.5(H) 3.8(H) 0.7    PT/INR Recent Labs    11/18/18 1801  LABPROT 16.6*  INR 1.36   Lipase     Component Value Date/Time   LIPASE 19 11/18/2018 1355    STUDIES: Ct Abdomen Pelvis W Contrast  Result Date: 11/18/2018 CLINICAL DATA:  Two weeks ago generalized abdominal pain. Loss of appetite. Weight loss. EXAM: CT ABDOMEN AND PELVIS WITH CONTRAST TECHNIQUE: Multidetector CT imaging of the abdomen and pelvis was performed using the standard protocol following bolus administration of intravenous contrast. CONTRAST:  170m OMNIPAQUE IOHEXOL 300 MG/ML  SOLN COMPARISON:  CT abdomen pelvis 09/11/2018 FINDINGS: Lower chest: Normal heart size dependent atelectasis bilateral lower lobes. No pleural effusion. Hepatobiliary: Liver is normal in size and contour. Gallbladder is mildly distended. No intrahepatic or extrahepatic biliary ductal dilatation. Pancreas: Pancreatic parenchymal atrophy. Pancreatic ductal dilatation measuring up to 9 mm.  No surrounding inflammatory change. There is a 7 mm  stone within the pancreatic duct near the ampulla (image 60; series 6). Spleen: Unremarkable Adrenals/Urinary Tract: Normal adrenal glands. Kidneys enhance symmetrically with contrast. No hydronephrosis. Urinary bladder is thick walled. Probable partially calcified exophytic lesion interpolar region left kidney (image 28; series 3). Stomach/Bowel: No abnormal bowel wall thickening or evidence for bowel obstruction. No free fluid or free intraperitoneal air. Vascular/Lymphatic: Normal caliber abdominal aorta. No retroperitoneal lymphadenopathy. Reproductive: Prostate is enlarged and heterogeneous in attenuation. Other: None. Musculoskeletal: No aggressive or acute appearing osseous lesions. IMPRESSION: The gallbladder is markedly distended. Recommend correlation with LFTs. Persistent dilatation of the main pancreatic duct with associated pancreatic parenchymal atrophy. No surrounding inflammatory changes. There is a 7 mm stone within the main pancreatic duct near the ampulla. In the nonacute setting, recommend further evaluation with MRI/MRCP to exclude the possibility of underlying lesion given pancreatic ductal dilatation/atrophy and distended gallbladder. Mild urinary bladder wall thickening, recommend correlation with urinalysis to exclude the possibility of cystitis. Electronically Signed   By: Lovey Newcomer M.D.   On: 11/18/2018 16:33   Mr Abdomen Mrcp Moise Boring Contast  Addendum Date: 11/19/2018   ADDENDUM REPORT: 11/19/2018 08:31 ADDENDUM: The original report was by Dr. Van Clines. The following addendum is by Dr. Van Clines: Additional T2 weighted images in the form of series 4 have been sent to PACS several minutes after the original dictation. I have reviewed these and they can demonstrate a faint granularity of tiny low T2 signal nodules in the liver which could be small siderotic nodules or conceivably a tiny foci of steatosis. Given that these nodules are low in signal on the non fat  saturated T2 weighted images, siderotic nodules are favored. Electronically Signed   By: Van Clines M.D.   On: 11/19/2018 08:31   Result Date: 11/19/2018 CLINICAL DATA:  Pancreatitis and liver disease. EXAM: MRI ABDOMEN WITHOUT AND WITH CONTRAST (INCLUDING MRCP) TECHNIQUE: Multiplanar multisequence MR imaging of the abdomen was performed both before and after the administration of intravenous contrast. Heavily T2-weighted images of the biliary and pancreatic ducts were obtained, and three-dimensional MRCP images were rendered by post processing. Subtraction images are unavailable due to a scanner error. CONTRAST:  5 cc Gadavist COMPARISON:  11/18/2018 CT FINDINGS: Lower chest: Unremarkable Hepatobiliary: Mild diffuse hepatic steatosis. Distended gallbladder with mild gallbladder wall thickening given the degree of distension. Mild periportal edema. No overt extrahepatic biliary dilatation. Scattered tiny nodules in the liver with low fat saturated T2 signal characteristics are inconspicuous generally low in signal on the T1 weighted images, more so on the out of phase images than the inphase images. These are nonenhancing and fairly subtle. I favor this representing a subtle nodular pattern steatosis overt siderotic nodules. Pancreas: Dilated dorsal pancreatic duct extending down to the known calcification in the pancreatic head near the terminal margin of the duct. Calcification is of course more conspicuous on CT. Pancreatic parenchyma in the body and head of the pancreas is thin and potentially mildly hypoenhancing. The pancreatic tail, but this does not appear to be the region narrowing. There no findings of pancreatic necrosis. Chronic calcific pancreatitis. Spleen:  Unremarkable Adrenals/Urinary Tract: Adrenal glands appear normal. Partially calcified cystic lesion of the left kidney lower pole compatible with Bosniak category 2 cyst. Several other tiny renal cysts are present. No worrisome renal  lesions. Stomach/Bowel: Unremarkable Vascular/Lymphatic: Aortoiliac atherosclerotic vascular disease. No pathologic adenopathy in the upper abdomen. Other:  No supplemental non-categorized findings.  Musculoskeletal: Lower lumbar degenerative disc disease. IMPRESSION: 1. Chronic calcific pancreatitis. Dilated dorsal pancreatic duct and terminated to a large calcification in the pancreatic head based on recent CT. This could be causing some level of chronic obstruction. There is atrophy of the pancreatic parenchyma in the pancreatic body with some questionable hypoenhancement although no appreciable mass or pancreatic necrosis. 2. Hepatic steatosis. There is some scattered fine nodularity which appears to dropout of signal more on the out of phase images and also are more conspicuous as low signal on T2 fat saturated images, probably reflecting a subtle nodular pattern of steatosis. Siderotic nodules might be a differential diagnostic consideration. 3. Periportal edema, nonspecific and often seen in a variety of conditions including heart failure, regional inflammation, secondary congestion, hepatitis, and a variety of other conditions. 4.  Aortic Atherosclerosis (ICD10-I70.0). 5. Distended gallbladder but with only borderline gallbladder wall thickening. No overt extrahepatic biliary dilatation. If further workup for cholecystitis is warranted by imaging, nuclear medicine pattern biliary scan could be used to assess patency of the cystic duct. Electronically Signed: By: Van Clines M.D. On: 11/19/2018 08:17    Impression / Plan:   Impression: 1. Abdominal pain: Generalized per patient, though some worse in his epigastrium; known chronic calcific pancreatitis with pancreatic duct stone which is likely cause 2. 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 Weight loss/ decreased appetite: Likely with alcoholism and above 4. Abnormal CT Abdomen: As above 5.  Thrombocytopenia 6. Melena: reported by patient, none since admit to ER, hemoccult negative  Plan: 1.  May need pancreatic duct intervention at some point given ongoing complaints.  Case will be discussed with Dr. Rush Landmark. 2.  Continue supportive measures. 3.  Patient may be on a clear liquid diet for now. 4. Reiterated complete alcohol abstinence to the patient and family today. 5.  Please await any further recommendations from Dr. Havery Moros later today.  Thank you for your kind consultation, we will continue to follow.  [facility]  11/19/2018, 11:39 AM

## 2018-11-19 NOTE — ED Notes (Signed)
PAGED ADMITTING PER RN  

## 2018-11-19 NOTE — ED Notes (Signed)
Dr. Samuella Cota contacted to confirm iv potassium. States to [employer] and no po meds due to NPO.

## 2018-11-19 NOTE — ED Notes (Signed)
Paged admitting per RN  

## 2018-11-20 DIAGNOSIS — K8689 Other specified diseases of pancreas: Secondary | ICD-10-CM | POA: Diagnosis not present

## 2018-11-20 DIAGNOSIS — K861 Other chronic pancreatitis: Secondary | ICD-10-CM | POA: Diagnosis not present

## 2018-11-20 DIAGNOSIS — E878 Other disorders of electrolyte and fluid balance, not elsewhere classified: Secondary | ICD-10-CM

## 2018-11-20 DIAGNOSIS — F1010 Alcohol abuse, uncomplicated: Secondary | ICD-10-CM

## 2018-11-20 DIAGNOSIS — D696 Thrombocytopenia, unspecified: Secondary | ICD-10-CM | POA: Diagnosis not present

## 2018-11-20 DIAGNOSIS — F1099 Alcohol use, unspecified with unspecified alcohol-induced disorder: Secondary | ICD-10-CM | POA: Diagnosis not present

## 2018-11-20 DIAGNOSIS — K709 Alcoholic liver disease, unspecified: Secondary | ICD-10-CM | POA: Diagnosis not present

## 2018-11-20 DIAGNOSIS — K860 Alcohol-induced chronic pancreatitis: Secondary | ICD-10-CM | POA: Diagnosis not present

## 2018-11-20 LAB — CBC
HCT: 26.5 % — ABNORMAL LOW (ref 39.0–52.0)
Hemoglobin: 9.5 g/dL — ABNORMAL LOW (ref 13.0–17.0)
MCH: 31.8 pg (ref 26.0–34.0)
MCHC: 35.8 g/dL (ref 30.0–36.0)
MCV: 88.6 fL (ref 80.0–100.0)
NRBC: 0.5 % — AB (ref 0.0–0.2)
Platelets: 54 10*3/uL — ABNORMAL LOW (ref 150–400)
RBC: 2.99 MIL/uL — AB (ref 4.22–5.81)
RDW: 14.4 % (ref 11.5–15.5)
WBC: 4.1 10*3/uL (ref 4.0–10.5)

## 2018-11-20 LAB — BASIC METABOLIC PANEL
Anion gap: 6 (ref 5–15)
BUN: <5 mg/dL — ABNORMAL LOW (ref 6–20)
CHLORIDE: 105 mmol/L (ref 98–111)
CO2: 24 mmol/L (ref 22–32)
Calcium: 8 mg/dL — ABNORMAL LOW (ref 8.9–10.3)
Creatinine, Ser: 0.63 mg/dL (ref 0.61–1.24)
GFR calc Af Amer: >60 mL/min (ref >60)
GFR calc non Af Amer: >60 mL/min (ref >60)
Glucose, Bld: 114 mg/dL — ABNORMAL HIGH (ref 70–99)
Potassium: 3.6 mmol/L (ref 3.5–5.1)
Sodium: 135 mmol/L (ref 135–145)

## 2018-11-20 LAB — HEPATIC FUNCTION PANEL
ALT: 72 U/L — ABNORMAL HIGH (ref 0–44)
AST: 114 U/L — ABNORMAL HIGH (ref 15–41)
Albumin: 2.2 g/dL — ABNORMAL LOW (ref 3.5–5.0)
Alkaline Phosphatase: 109 U/L (ref 38–126)
Bilirubin, Direct: 1.1 mg/dL — ABNORMAL HIGH (ref 0.0–0.2)
Indirect Bilirubin: 1.5 mg/dL — ABNORMAL HIGH (ref 0.3–0.9)
Total Bilirubin: 2.6 mg/dL — ABNORMAL HIGH (ref 0.3–1.2)
Total Protein: 5.9 g/dL — ABNORMAL LOW (ref 6.5–8.1)

## 2018-11-20 LAB — PHOSPHORUS: Phosphorus: 2.2 mg/dL — ABNORMAL LOW (ref 2.5–4.6)

## 2018-11-20 LAB — MAGNESIUM: Magnesium: 1.7 mg/dL (ref 1.7–2.4)

## 2018-11-20 MED ORDER — PANCRELIPASE (LIP-PROT-AMYL) 36000-114000 UNITS PO CPEP
36000.0000 [IU] | ORAL_CAPSULE | Freq: Three times a day (TID) | ORAL | Status: DC
  Administered 2018-11-20 – 2018-11-22 (×6): 36000 [IU] via ORAL
  Filled 2018-11-20 (×7): qty 1

## 2018-11-20 MED ORDER — POTASSIUM & SODIUM PHOSPHATES 280-160-250 MG PO PACK
1.0000 | PACK | Freq: Two times a day (BID) | ORAL | Status: AC
  Administered 2018-11-20 (×2): 1 via ORAL
  Filled 2018-11-20 (×2): qty 1

## 2018-11-20 NOTE — Evaluation (Signed)
Physical Therapy Evaluation Patient Details Name: Samuel [name] MRN: [id number] DOB: Sep 09, 1961 Today's Date: 11/20/2018   History of Present Illness  58yo male with continuting alcohol use and 3 weeks of abdominal pain in the LLQ, melena, weakness, diarrhea, and also with known pancreatic duct stone. Non-compliant with pancrealipase. Admitted for workup of abdominal pain. PMH alcohol abuse, recurrent pancreatitis, hx seizures due to alcohol withdrawal   Clinical Impression   Patient received in bed, very pleasant and willing to participate in PT today. Able to complete bed mobility with S, however did require Min guard to stand and gain balance once standing. Tolerated gait training approximately 63f in the hallway while holding IV pole with one hand in addition to MinA to maintain balance secondary to narrow BOS and frequent lateral LOB; noted scissoring, narrow gait pattern and poor initiation of stepping response with loss of balance today. Easily fatigued however HR and SpO2 remained WNL with gait. He was returned to bed with bed alarm active and all needs met. He will continue to benefit from skilled PT services in the acute setting, also recommend ST-SNF services moving forward as mother cannot provide physical assistance at home, however note he may be able to progress to HHPT and 24/7 assist- will update chart if/as appropriate.     Follow Up Recommendations SNF;Other (comment)(might be able to progress to HHPT and 24/7 S )    Equipment Recommendations  Rolling walker with 5" wheels;3in1 (PT)    Recommendations for Other Services       Precautions / Restrictions Precautions Precautions: Fall Restrictions Weight Bearing Restrictions: No      Mobility  Bed Mobility Overal bed mobility: Needs Assistance Bed Mobility: Supine to Sit;Sit to Supine     Supine to sit: Supervision Sit to supine: Supervision   General bed mobility comments: S for safety, no physical assist  given   Transfers Overall transfer level: Needs assistance Equipment used: None Transfers: Sit to/from Stand Sit to Stand: Min guard         General transfer comment: min guard for safety, also min guard to steady and gain balance once standing   Ambulation/Gait Ambulation/Gait assistance: Min assist Gait Distance (Feet): 80 Feet Assistive device: IV Pole Gait Pattern/deviations: Step-through pattern;Scissoring;Staggering left;Staggering right;Drifts right/left;Narrow base of support Gait velocity: decreased    General Gait Details: narrow BOS and frequent lateral LOB requiring MinA to correct even when using IV pole to assist in maintaining balance; easily fatigued. HR and SpO2 WNL during gait.   Stairs            Wheelchair Mobility    Modified Rankin (Stroke Patients Only)       Balance Overall balance assessment: Needs assistance Sitting-balance support: Bilateral upper extremity supported;Feet supported Sitting balance-Leahy Scale: Good     Standing balance support: No upper extremity supported;During functional activity Standing balance-Leahy Scale: Poor Standing balance comment: MinA and U UE support on IV pole                              Pertinent Vitals/Pain Pain Assessment: Faces Faces Pain Scale: Hurts little more Pain Location: abdomen in general  Pain Descriptors / Indicators: Aching;Discomfort Pain Intervention(s): Limited activity within patient's tolerance;Monitored during session    Home Living Family/patient expects to be discharged to:: Private residence Living Arrangements: Parent Available Help at Discharge: Family;Available 24 hours/day Type of Home: Apartment Home Access: Level entry  Home Layout: One level Home Equipment: None Additional Comments: 1 curb to enter apartment, reports he helps take care of his mom who is aging and his mom also has a CNA that comes in 3 hours a day     Prior Function Level of  Independence: Independent               Hand Dominance        Extremity/Trunk Assessment   Upper Extremity Assessment Upper Extremity Assessment: Defer to OT evaluation    Lower Extremity Assessment Lower Extremity Assessment: Generalized weakness    Cervical / Trunk Assessment Cervical / Trunk Assessment: Normal  Communication   Communication: No difficulties  Cognition Arousal/Alertness: Awake/alert Behavior During Therapy: WFL for tasks assessed/performed;Flat affect Overall Cognitive Status: Within Functional Limits for tasks assessed                                        General Comments      Exercises     Assessment/Plan    PT Assessment Patient needs continued PT services  PT Problem List Decreased strength;Decreased balance;Decreased mobility;Decreased knowledge of use of DME;Decreased activity tolerance;Decreased coordination;Decreased safety awareness       PT Treatment Interventions DME instruction;Functional mobility training;Balance training;Patient/family education;Gait training;Therapeutic activities;Neuromuscular re-education;Stair training;Therapeutic exercise;Cognitive remediation    PT Goals (Current goals can be found in the Care Plan section)  Acute Rehab PT Goals Patient Stated Goal: get stronger  PT Goal Formulation: With patient Time For Goal Achievement: 12/04/18 Potential to Achieve Goals: Good    Frequency Min 3X/week   Barriers to discharge        Co-evaluation               AM-PAC PT "6 Clicks" Mobility  Outcome Measure Help needed turning from your back to your side while in a flat bed without using bedrails?: None Help needed moving from lying on your back to sitting on the side of a flat bed without using bedrails?: None Help needed moving to and from a bed to a chair (including a wheelchair)?: A Little Help needed standing up from a chair using your arms (e.g., wheelchair or bedside chair)?: A  Little Help needed to walk in hospital room?: A Little Help needed climbing 3-5 steps with a railing? : A Little 6 Click Score: 20    End of Session Equipment Utilized During Treatment: Gait belt Activity Tolerance: Patient limited by fatigue;Patient tolerated treatment well Patient left: in bed;with bed alarm set;with call bell/phone within reach   PT Visit Diagnosis: Unsteadiness on feet (R26.81);Muscle weakness (generalized) (M62.81)    Time: 7371-0626 PT Time Calculation (min) (ACUTE ONLY): 14 min   Charges:   PT Evaluation $PT Eval Moderate Complexity: 1 Mod          Deniece Ree PT, DPT, CBIS  Supplemental Physical Therapist [place]    Pager [phone] Acute Rehab Office [phone]

## 2018-11-20 NOTE — Progress Notes (Signed)
Progress Note   Subjective  Chief Complaint: Abdominal pain, elevated LFTs  This morning, the patient is doing well.  He tells me that he is doing fine with his clear liquid diet, no nausea, vomiting or increased abdominal pain.  He has had a bowel movement which was not black anymore.  Denies any new complaints.   Objective   Vital signs in last 24 hours: Temp:  [97.4 F (36.3 C)-98.1 F (36.7 C)] 98.1 F (36.7 C) (02/11 0833) Pulse Rate:  [42-64] 64 (02/11 0833) Resp:  [16-20] 20 (02/11 0833) BP: (98-123)/(64-94) 120/69 (02/11 0833) SpO2:  [96 %-100 %] 96 % (02/11 0833) Weight:  [52 kg] 52 kg (02/10 2100) Last BM Date: 11/20/18 General:    AA male in NAD Heart:  Regular rate and rhythm; no murmurs Lungs: Respirations even and unlabored, lungs CTA bilaterally Abdomen:  Soft, mild epigastric ttp and nondistended. Normal bowel sounds. Extremities:  Without edema. Neurologic:  Alert and oriented,  grossly normal neurologically. Psych:  Cooperative. Normal mood and affect.  Intake/Output from previous day: 02/10 0701 - 02/11 0700 In: 1540 [P.O.:240; I.V.:700; IV Piggyback:600] Out: 1200 [Urine:1200] Intake/Output this shift: Total I/O In: 858.8 [P.O.:240; I.V.:618.8] Out: 800 [Urine:800]  Lab Results: Recent Labs    11/18/18 1355 11/19/18 0254 11/20/18 0246  WBC 5.0 4.5 4.1  HGB 10.7* 9.7* 9.5*  HCT 30.1* 27.5* 26.5*  PLT 43* 44* 54*   BMET Recent Labs    11/18/18 1355 11/19/18 0254 11/20/18 0246  NA 134* 134* 135  K 3.4* 3.2* 3.6  CL 99 101 105  CO2 26 23 24   GLUCOSE 130* 111* 114*  BUN <5* <5* <5*  CREATININE 0.60* 0.58* 0.63  CALCIUM 8.2* 7.8* 8.0*   LFT Recent Labs    11/20/18 0246  PROT 5.9*  ALBUMIN 2.2*  AST 114*  ALT 72*  ALKPHOS 109  BILITOT 2.6*  BILIDIR 1.1*  IBILI 1.5*   PT/INR Recent Labs    11/18/18 1801  LABPROT 16.6*  INR 1.36    Studies/Results: Ct Abdomen Pelvis W Contrast  Result Date: 11/18/2018 CLINICAL DATA:   Two weeks ago generalized abdominal pain. Loss of appetite. Weight loss. EXAM: CT ABDOMEN AND PELVIS WITH CONTRAST TECHNIQUE: Multidetector CT imaging of the abdomen and pelvis was performed using the standard protocol following bolus administration of intravenous contrast. CONTRAST:  100mL OMNIPAQUE IOHEXOL 300 MG/ML  SOLN COMPARISON:  CT abdomen pelvis 09/11/2018 FINDINGS: Lower chest: Normal heart size dependent atelectasis bilateral lower lobes. No pleural effusion. Hepatobiliary: Liver is normal in size and contour. Gallbladder is mildly distended. No intrahepatic or extrahepatic biliary ductal dilatation. Pancreas: Pancreatic parenchymal atrophy. Pancreatic ductal dilatation measuring up to 9 mm. No surrounding inflammatory change. There is a 7 mm stone within the pancreatic duct near the ampulla (image 60; series 6). Spleen: Unremarkable Adrenals/Urinary Tract: Normal adrenal glands. Kidneys enhance symmetrically with contrast. No hydronephrosis. Urinary bladder is thick walled. Probable partially calcified exophytic lesion interpolar region left kidney (image 28; series 3). Stomach/Bowel: No abnormal bowel wall thickening or evidence for bowel obstruction. No free fluid or free intraperitoneal air. Vascular/Lymphatic: Normal caliber abdominal aorta. No retroperitoneal lymphadenopathy. Reproductive: Prostate is enlarged and heterogeneous in attenuation. Other: None. Musculoskeletal: No aggressive or acute appearing osseous lesions. IMPRESSION: The gallbladder is markedly distended. Recommend correlation with LFTs. Persistent dilatation of the main pancreatic duct with associated pancreatic parenchymal atrophy. No surrounding inflammatory changes. There is a 7 mm stone within the main pancreatic duct near  the ampulla. In the nonacute setting, recommend further evaluation with MRI/MRCP to exclude the possibility of underlying lesion given pancreatic ductal dilatation/atrophy and distended gallbladder. Mild  urinary bladder wall thickening, recommend correlation with urinalysis to exclude the possibility of cystitis. Electronically Signed   By: Annia Belt M.D.   On: 11/18/2018 16:33   Mr Abdomen Mrcp Vivien Rossetti Contast  Addendum Date: 11/19/2018   ADDENDUM REPORT: 11/19/2018 08:31 ADDENDUM: The original report was by Dr. Gaylyn Rong. The following addendum is by Dr. Gaylyn Rong: Additional T2 weighted images in the form of series 4 have been sent to PACS several minutes after the original dictation. I have reviewed these and they can demonstrate a faint granularity of tiny low T2 signal nodules in the liver which could be small siderotic nodules or conceivably a tiny foci of steatosis. Given that these nodules are low in signal on the non fat saturated T2 weighted images, siderotic nodules are favored. Electronically Signed   By: Gaylyn Rong M.D.   On: 11/19/2018 08:31   Result Date: 11/19/2018 CLINICAL DATA:  Pancreatitis and liver disease. EXAM: MRI ABDOMEN WITHOUT AND WITH CONTRAST (INCLUDING MRCP) TECHNIQUE: Multiplanar multisequence MR imaging of the abdomen was performed both before and after the administration of intravenous contrast. Heavily T2-weighted images of the biliary and pancreatic ducts were obtained, and three-dimensional MRCP images were rendered by post processing. Subtraction images are unavailable due to a scanner error. CONTRAST:  5 cc Gadavist COMPARISON:  11/18/2018 CT FINDINGS: Lower chest: Unremarkable Hepatobiliary: Mild diffuse hepatic steatosis. Distended gallbladder with mild gallbladder wall thickening given the degree of distension. Mild periportal edema. No overt extrahepatic biliary dilatation. Scattered tiny nodules in the liver with low fat saturated T2 signal characteristics are inconspicuous generally low in signal on the T1 weighted images, more so on the out of phase images than the inphase images. These are nonenhancing and fairly subtle. I favor this  representing a subtle nodular pattern steatosis overt siderotic nodules. Pancreas: Dilated dorsal pancreatic duct extending down to the known calcification in the pancreatic head near the terminal margin of the duct. Calcification is of course more conspicuous on CT. Pancreatic parenchyma in the body and head of the pancreas is thin and potentially mildly hypoenhancing. The pancreatic tail, but this does not appear to be the region narrowing. There no findings of pancreatic necrosis. Chronic calcific pancreatitis. Spleen:  Unremarkable Adrenals/Urinary Tract: Adrenal glands appear normal. Partially calcified cystic lesion of the left kidney lower pole compatible with Bosniak category 2 cyst. Several other tiny renal cysts are present. No worrisome renal lesions. Stomach/Bowel: Unremarkable Vascular/Lymphatic: Aortoiliac atherosclerotic vascular disease. No pathologic adenopathy in the upper abdomen. Other:  No supplemental non-categorized findings. Musculoskeletal: Lower lumbar degenerative disc disease. IMPRESSION: 1. Chronic calcific pancreatitis. Dilated dorsal pancreatic duct and terminated to a large calcification in the pancreatic head based on recent CT. This could be causing some level of chronic obstruction. There is atrophy of the pancreatic parenchyma in the pancreatic body with some questionable hypoenhancement although no appreciable mass or pancreatic necrosis. 2. Hepatic steatosis. There is some scattered fine nodularity which appears to dropout of signal more on the out of phase images and also are more conspicuous as low signal on T2 fat saturated images, probably reflecting a subtle nodular pattern of steatosis. Siderotic nodules might be a differential diagnostic consideration. 3. Periportal edema, nonspecific and often seen in a variety of conditions including heart failure, regional inflammation, secondary congestion, hepatitis, and a variety of other  conditions. 4.  Aortic Atherosclerosis  (ICD10-I70.0). 5. Distended gallbladder but with only borderline gallbladder wall thickening. No overt extrahepatic biliary dilatation. If further workup for cholecystitis is warranted by imaging, nuclear medicine pattern biliary scan could be used to assess patency of the cystic duct. Electronically Signed: By: Gaylyn RongWalter  Liebkemann M.D. On: 11/19/2018 08:17    Assessment / Plan:   Assessment: 1.  Abdominal pain: Generalized per patient, some better today; likely due to noncalcific pancreatitis 2.  Chronic calcific pancreatitis 3.  Weight loss/decreased appetite 4.  Abnormal CT abdomen 5.  Thrombocytopenia 6.  Melena: Hemoccult negative, none since admission  Plan: 1.  Again discussed alcohol abstinence 2.  Will increase patient to full liquid diet today and then can increase as tolerated 3.  Case discussed with Dr. Meridee ScoreMansouraty.  Likely patient would benefit from EUS as an outpatient. Can have follow up in our clinic with Dr. Meridee ScoreMansouraty once discharged. 4.  Please await any final recommendations from Dr. Adela LankArmbruster later today  Thank you for your kind consultation.  We will sign off.   LOS: 2 days   Unk LightningJennifer Lynne Fortune Brannigan  11/20/2018, 12:06 PM

## 2018-11-20 NOTE — Progress Notes (Addendum)
Subjective: Patient states that his abdominal pain is improved this morning.  He has been taking PO fluid without difficulty and would like to try a regular diet.  Denies dizziness during episode of bradycardia yesterday.  We discussed possibility of discharge later today if he can eat without nausea and he is comfortable with this plan.  He understands that should follow up with GI outpatient regarding further management of the stone in his pancreatic duct.  Objective:  Vital signs in last 24 hours: Vitals:   11/19/18 2344 11/20/18 0300 11/20/18 0715 11/20/18 0833  BP: 109/71 98/65  120/69  Pulse: (!) 53 (!) 55 (!) 53 64  Resp: 16 17 18 20   Temp: 97.8 F (36.6 C) 97.6 F (36.4 C)  98.1 F (36.7 C)  TempSrc: Oral Oral  Oral  SpO2: 100% 100% 100% 96%  Weight:      Height:       Gen: well-appearing, lying in bed, no acute distress CV: regular rate and rhythm without murmurs Pulm: breathing comfortably on room air, lungs clear bilaterally Abd: soft and non-distended, minimal tenderness to palpation, no rebound or guarding, bowel sounds present Ext: 4x distal pulses palpable and symmetric, no lower extremity swelling Neuro: alert and oriented, cooperative with exam  Assessment/Plan:  Samuel [name] is a 58 year old man with a history of alcohol abuse and recurrent acute pancreatitis who presented with several weeks of abdominal pain, nausea, diarrhea, generalized weakness, and possible melenic stools.  Found to have a 7 mm gallstone in the pancreatic duct and 2:1 transaminitis.  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  Active Problems:   Transaminitis   Pancreatic duct dilated  Chronic pancreatitis: Has history of recurrent attacks and has not taken his pancreatic enzyme replacement for unspecified length of time in setting of continued alcohol abuse. Abdominal exam improved, CT and MRCP  showed 7 mm stone in the main pancreatic duct with chronic dilation and calcific pancreas but no acute process.  GI does not see indication for intervention during this hospitalization. -Gastroenterology has been consulted > advance diet and continue pancreatic enzyme, encouraged to stop drinking and will follow up with him outpatient. Signed off.  -Continue home pancreatic enzyme replacement -Advance to regular diet today -Continue zofran PRN -Continue tylenol PRN -Continue imodium PRN -If he can tolerate regular diet, may discharge today, will re-evaluate later today  Alcohol abuse Last drink 3 days prior to admission.  Hospitalized in early December with seizures likely due to withdrawal, although he denies history of withdrawal.  Labs show 2:1 transaminitis with total bilirubin 3.8, possible findings of cirrhosis on MRCP.  He is not showing signs of withdrawal on exam this morning and has not required lorazepam.  -Discontinue CIWA with lorazepam PRN -Thiamine 100 mg daily -Multivitamin and folic acid  Thrombocytopenia: Platelets 44 on admission, consistent with stable thrombocytopenia identified on prior blood work.  HIV and hepatitis panel negative in December.  Likely secondary to chronic liver disease and alcohol use.  He did give history of melenic stools but FOBT negative. He denies any other sources of bleeding. Hgb has been stable.  -Monitor for bleeds -Follow daily CBC   Electrolyte abnormalities: -Stable this morning.  FEN:No fluids, replete electrolytes PRN, regular VTE ppx: SCDs Code status: full code  Dispo: Anticipated discharge in approximately 0-1 day  Tilda BurrowKerner, David S, Medical Student 11/20/2018, 10:53 AM   Attestation for Student Documentation:  I personally was present and  performed or re-performed the history, physical exam and medical decision-making activities of this service and have verified that the service and findings are accurately documented in the  student's note.  Claudean SeveranceKrienke, Avrey Flanagin M, MD 11/20/2018, 1:28 PM

## 2018-11-21 ENCOUNTER — Telehealth: Payer: Self-pay

## 2018-11-21 ENCOUNTER — Inpatient Hospital Stay (HOSPITAL_COMMUNITY): Payer: BLUE CROSS/BLUE SHIELD

## 2018-11-21 DIAGNOSIS — D696 Thrombocytopenia, unspecified: Secondary | ICD-10-CM | POA: Diagnosis not present

## 2018-11-21 DIAGNOSIS — F1099 Alcohol use, unspecified with unspecified alcohol-induced disorder: Secondary | ICD-10-CM | POA: Diagnosis not present

## 2018-11-21 DIAGNOSIS — K8050 Calculus of bile duct without cholangitis or cholecystitis without obstruction: Secondary | ICD-10-CM | POA: Diagnosis not present

## 2018-11-21 DIAGNOSIS — R260 Ataxic gait: Secondary | ICD-10-CM

## 2018-11-21 DIAGNOSIS — K861 Other chronic pancreatitis: Secondary | ICD-10-CM | POA: Diagnosis not present

## 2018-11-21 LAB — FOLATE: Folate: 45.3 ng/mL (ref >5.9)

## 2018-11-21 LAB — BASIC METABOLIC PANEL
Anion gap: 5 (ref 5–15)
BUN: <5 mg/dL — ABNORMAL LOW (ref 6–20)
CHLORIDE: 103 mmol/L (ref 98–111)
CO2: 27 mmol/L (ref 22–32)
Calcium: 8.4 mg/dL — ABNORMAL LOW (ref 8.9–10.3)
Creatinine, Ser: 0.57 mg/dL — ABNORMAL LOW (ref 0.61–1.24)
GFR calc Af Amer: >60 mL/min (ref >60)
GFR calc non Af Amer: >60 mL/min (ref >60)
Glucose, Bld: 115 mg/dL — ABNORMAL HIGH (ref 70–99)
POTASSIUM: 3.8 mmol/L (ref 3.5–5.1)
Sodium: 135 mmol/L (ref 135–145)

## 2018-11-21 LAB — VITAMIN B12: Vitamin B-12: 610 pg/mL (ref 180–914)

## 2018-11-21 LAB — MAGNESIUM: Magnesium: 1.4 mg/dL — ABNORMAL LOW (ref 1.7–2.4)

## 2018-11-21 LAB — PHOSPHORUS: Phosphorus: 3.1 mg/dL (ref 2.5–4.6)

## 2018-11-21 MED ORDER — VITAMIN B-1 100 MG PO TABS
100.0000 mg | ORAL_TABLET | Freq: Every day | ORAL | Status: DC
  Administered 2018-11-22: 100 mg via ORAL
  Filled 2018-11-21: qty 1

## 2018-11-21 MED ORDER — FOLIC ACID 1 MG PO TABS
1.0000 mg | ORAL_TABLET | Freq: Every day | ORAL | Status: DC
  Administered 2018-11-22: 1 mg via ORAL
  Filled 2018-11-21: qty 1

## 2018-11-21 MED ORDER — MAGNESIUM SULFATE 4 GM/100ML IV SOLN
4.0000 g | Freq: Once | INTRAVENOUS | Status: AC
  Administered 2018-11-21: 4 g via INTRAVENOUS
  Filled 2018-11-21: qty 100

## 2018-11-21 MED ORDER — GADOBUTROL 1 MMOL/ML IV SOLN
5.0000 mL | Freq: Once | INTRAVENOUS | Status: AC | PRN
  Administered 2018-11-21: 5 mL via INTRAVENOUS

## 2018-11-21 NOTE — Telephone Encounter (Signed)
-----   Message from Benancio Deeds, MD sent at 11/20/2018  8:15 PM EST ----- Regarding: RE: appointment I spoke with Mansouraty about it...he wants to see him and discuss EUS. I think better to reschedule with Dr Judie Petit Thanks ----- Message ----- From: Loretha Stapler, RN Sent: 11/20/2018  12:46 PM EST To: Benancio Deeds, MD Subject: RE: appointment                                In Dr Irving Burton note he wanted the pt to follow with Dr Marina Goodell and it was scheduled with Dr Marina Goodell for 2/27.  Is this ok? ----- Message ----- From: Benancio Deeds, MD Sent: 11/20/2018  12:32 PM EST To: Loretha Stapler, RN Subject: appointment                                    Tali Coster can you help coordinate an office follow up for consultation with Dr. Meridee Score in the next 4-6 weeks? He will be discharged in the next day or so. Thanks

## 2018-11-21 NOTE — Progress Notes (Signed)
Subjective: No acute events overnight. Patient states that his abdominal pain is stable this morning.  He has been eating a regular diet without nausea, vomiting, or worsening abdominal pain.  He was able to work with physical therapy yesterday and says he felt like his balance and strength were poor, but this has been ongoing since being admitted to the hospital for seizure in December.  Before that he did not have ataxia.  He is comfortable with plan to discharge to SNF when medically ready.  Objective:  Vital signs in last 24 hours: Vitals:   11/21/18 0326 11/21/18 0700 11/21/18 0715 11/21/18 1100  BP: 121/67 117/79  114/79  Pulse: (!) 47 65 (!) 53 (!) 50  Resp: 19 19 20 18   Temp: 98 F (36.7 C) 98.5 F (36.9 C)  98.1 F (36.7 C)  TempSrc: Oral Oral  Oral  SpO2:  100% 100% 100%  Weight:      Height:       Gen: well-appearing, lying in bed, no acute distress CV: regular rate and rhythm, no m/r/g Pulm: breathing comfortably on room air, lungs clear bilaterally Abd: soft and non-distended, minimal tenderness to palpation, no rebound or guarding, bowel sounds present Ext: 4x distal pulses palpable and symmetric, no lower extremity swelling Neuro: alert and oriented, cranial nerves intact and symmetric, tongue fasciculations and subtle difficulty with left-sided finger-to-nose are observed, upper and lower extremities with 5/5 strength  Assessment/Plan:  Samuel [name] is a 58 year old man with a history of alcohol abuse and recurrent acute pancreatitis who presented with several weeks of abdominal pain, nausea, diarrhea, generalized weakness, and possible melenic stools.  Found to have a 7 mm gallstone in the pancreatic duct and 2:1 transaminitis.  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  Known 7 mm stone in the main pancreatic duct with chronic dilation and calcific pancreas.  No interventions planned at this time, GI has signed off and will follow outpatient to determine if procedure is necessary.  He is tolerating regular diet and pain is well-controlled. Abdominal pain is unchanged.  -Continue home pancreatic enzyme replacement -Continue zofran PRN -Continue tylenol PRN -Continue imodium PRN  Alcohol abuse Ataxic gait: Last drink 3 days prior to admission, no signs of withdrawal.  Hospitalized in early December with seizures likely due to alcohol dependence after which he developed ataxic gait and weakness. On exam today he had mild difficulty with cerebellar movements of his left side. Likely due to deconditioning but consider cerebellar lesion or nutritional deficiency given chronic pancreatitis with malabsorption.  -Follow up MRI head -Follow up B12, folate, vitamin E -Continue thiamine 100 mg daily, switch to PO -Continue multivitamin, folic acid, B12 supplement  -PT recommends discharge to SNF or HHPT, patient is agreeable and case management assisting with this -Continue PT  Thrombocytopenia: Platelets 44 on admission, consistent with stable thrombocytopenia identified on prior blood work.  HIV and hepatitis panel negative in December.  Likely secondary to chronic liver disease and alcohol use.  He did give history of melenic stools but FOBT negative. He denies any other sources of bleeding. Hgb has been stable.  -Monitor for bleeds -Follow daily CBC   Electrolyte abnormalities: Magnesium low this morning, will replete. Recheck in AM.  FEN: No fluids, replete electrolytes PRN, regular VTE ppx: SCDs Code status:  full code  Dispo: Anticipated discharge is pending MRI results and SNF placement  Tilda Burrow, Medical Student 11/21/2018, 1:27 PM   Attestation for Student  Documentation:  I personally was present and performed or re-performed the history, physical exam and medical decision-making activities of this service and have verified that the service and findings are accurately documented in the student's note.  Samuel Severance, MD 11/21/2018, 1:56 PM

## 2018-11-21 NOTE — Care Management Note (Addendum)
Case Management Note  Patient Details  Name: Samuel [name] MRN: [id number] Date of Birth: 16-May-1961  Subjective/Objective:   From home with mom, pancreatitis ,lower abd paine, will need HHPT, refusing SNF per CSW, NCM offered choice from Medicare.gov list, he chose [facility], referral made to [facility] for HH[facility], he states patient will have a co payment, he will let patient know what co pmt is tomorrow.  Soc will begin 24-48 hrs post dc.  Also benefit check in process for creon.   Need HHPT order, NCM paged MD to get order.                Action/Plan: Will need HHPT order prior to dc and awaiting creon benefit check.  Expected Discharge Date:  11/26/18               Expected Discharge Plan:  Home w Home Health Services  In-House Referral:     Discharge planning Services  CM Consult, Medication Assistance  Post Acute Care Choice:  Home Health Choice offered to:  Patient  DME Arranged:    DME Agency:     HH Arranged:  PT HH Agency:  [facility]  Status of Service:  Completed, signed off  If discussed at Long Length of Stay Meetings, dates discussed:    Additional Comments:  Leone Haven, RN 11/21/2018, 4:38 PM

## 2018-11-21 NOTE — Telephone Encounter (Signed)
appt made for 3-17 at 1:45 pm with Dr Meridee Score.  Appt will be given to pt at discharge.

## 2018-11-22 DIAGNOSIS — D696 Thrombocytopenia, unspecified: Secondary | ICD-10-CM | POA: Diagnosis not present

## 2018-11-22 DIAGNOSIS — F1099 Alcohol use, unspecified with unspecified alcohol-induced disorder: Secondary | ICD-10-CM | POA: Diagnosis not present

## 2018-11-22 DIAGNOSIS — K861 Other chronic pancreatitis: Secondary | ICD-10-CM | POA: Diagnosis not present

## 2018-11-22 DIAGNOSIS — K8050 Calculus of bile duct without cholangitis or cholecystitis without obstruction: Secondary | ICD-10-CM | POA: Diagnosis not present

## 2018-11-22 LAB — BASIC METABOLIC PANEL
Anion gap: 6 (ref 5–15)
BUN: <5 mg/dL — ABNORMAL LOW (ref 6–20)
CO2: 24 mmol/L (ref 22–32)
Calcium: 8.2 mg/dL — ABNORMAL LOW (ref 8.9–10.3)
Chloride: 105 mmol/L (ref 98–111)
Creatinine, Ser: 0.67 mg/dL (ref 0.61–1.24)
GFR calc Af Amer: >60 mL/min (ref >60)
GFR calc non Af Amer: >60 mL/min (ref >60)
GLUCOSE: 128 mg/dL — AB (ref 70–99)
Potassium: 3.6 mmol/L (ref 3.5–5.1)
Sodium: 135 mmol/L (ref 135–145)

## 2018-11-22 LAB — MAGNESIUM: Magnesium: 1.7 mg/dL (ref 1.7–2.4)

## 2018-11-22 MED ORDER — ADULT MULTIVITAMIN W/MINERALS CH: 1.0000 | ORAL_TABLET | Freq: Every day | ORAL | 0 refills | Status: DC

## 2018-11-22 NOTE — Progress Notes (Signed)
Physical Therapy Treatment Patient Details Name: Samuel [name] MRN: [id number] DOB: 1961-01-23 Today's Date: 11/22/2018    History of Present Illness 58yo male with continuting alcohol use and 3 weeks of abdominal pain in the LLQ, melena, weakness, diarrhea, and also with known pancreatic duct stone. Non-compliant with pancrealipase. Admitted for workup of abdominal pain. 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 Continues to present with unsteadiness and pt with multiple LOB when performing dynamic gait tasks. Required min A for steadying assist. Feel pt would benefit from short term SNF level therapies, however, pt currently refusing. Will need HHPT and RW. Will continue to follow acutely to maximize functional mobility independence and safety.    Follow Up Recommendations  SNF;Supervision for mobility/OOB(pt refusing SNF; will need HHPT )     Equipment Recommendations  Rolling walker with 5" wheels;3in1 (PT)    Recommendations for Other Services       Precautions / Restrictions Precautions Precautions: Fall Restrictions Weight Bearing Restrictions: No    Mobility  Bed Mobility Overal bed mobility: Needs Assistance Bed Mobility: Supine to Sit;Sit to Supine     Supine to sit: Supervision Sit to supine: Supervision   General bed mobility comments: Supervision for safety.   Transfers Overall transfer level: Needs assistance Equipment used: None Transfers: Sit to/from Stand Sit to Stand: Min guard         General transfer comment: Min guard for steadying assist.   Ambulation/Gait Ambulation/Gait assistance: Min assist Gait Distance (Feet): 250 Feet Assistive device: None Gait Pattern/deviations: Step-through pattern;Staggering left;Staggering right;Drifts right/left;Narrow base of support;Decreased stride length Gait velocity: Decreased    General Gait Details: Pt unsteady  and drifting to the L and R during normal gait. Had pt perform horizontal and vertical head turns and pt with notable LOBs. Also had pt step over obstacles and pt with unsteadiness as well. Educated about use of RW at home to increase stability.    Stairs             Wheelchair Mobility    Modified Rankin (Stroke Patients Only)       Balance Overall balance assessment: Needs assistance Sitting-balance support: No upper extremity supported;Feet supported Sitting balance-Leahy Scale: Good     Standing balance support: No upper extremity supported;During functional activity Standing balance-Leahy Scale: Poor Standing balance comment: Reliant on external support to maintain balance.                             Cognition Arousal/Alertness: Awake/alert Behavior During Therapy: WFL for tasks assessed/performed;Flat affect Overall Cognitive Status: No family/caregiver present to determine baseline cognitive functioning                                 General Comments: Memory deficits noted as pt asking about MD note and had just asked RN as well about return to work.       Exercises      General Comments General comments (skin integrity, edema, etc.): Pt requesting note for missed work, and educated that MD would have to write. Notified RN, and RN reports he had just spoken with pt about that issue. Question memory deficits.       Pertinent Vitals/Pain Pain Assessment: No/denies pain    Home Living  Prior Function            PT Goals (current goals can now be found in the care plan section) Acute Rehab PT Goals Patient Stated Goal: get stronger  PT Goal Formulation: With patient Time For Goal Achievement: 12/04/18 Potential to Achieve Goals: Good Progress towards PT goals: Progressing toward goals    Frequency    Min 3X/week      PT Plan Current plan remains appropriate    Co-evaluation               AM-PAC PT "6 Clicks" Mobility   Outcome Measure  Help needed turning from your back to your side while in a flat bed without using bedrails?: None Help needed moving from lying on your back to sitting on the side of a flat bed without using bedrails?: None Help needed moving to and from a bed to a chair (including a wheelchair)?: A Little Help needed standing up from a chair using your arms (e.g., wheelchair or bedside chair)?: A Little Help needed to walk in hospital room?: A Little Help needed climbing 3-5 steps with a railing? : A Lot 6 Click Score: 19    End of Session Equipment Utilized During Treatment: Gait belt Activity Tolerance: Patient tolerated treatment well Patient left: in bed;with call bell/phone within reach;with bed alarm set Nurse Communication: Mobility status PT Visit Diagnosis: Unsteadiness on feet (R26.81);Muscle weakness (generalized) (M62.81)     Time: 1610-9604 PT Time Calculation (min) (ACUTE ONLY): 15 min  Charges:  $Gait Training: 8-22 mins                     Gladys Damme, PT, DPT  Acute Rehabilitation Services  Pager: [phone] Office: [phone]    Lehman Prom 11/22/2018, 1:23 PM

## 2018-11-22 NOTE — Discharge Summary (Signed)
Name: Samuel [name] MRN: [id number] DOB: 1960-12-30 58 y.o. PCP: Bing NeighborsHarris, Kimberly S, FNP  Date of Admission: 11/18/2018  1:46 PM Date of Discharge: 11/22/2018 Attending Physician: Debe CoderEmily Mullen, MD  Discharge Diagnosis: Chronic pancreatitis, pancreatic duct stone Ataxia Alcohol abuse Transaminitis Chronic thrombocytopenia  Discharge Medications: Allergies as of 11/22/2018   No Known Allergies     Medication List    TAKE these medications   Albuterol Sulfate 108 (90 Base) MCG/ACT Aepb Inhale 2 puffs into the lungs every 4 (four) hours as needed. What changed:  reasons to take this   aspirin 325 MG tablet Take 325 mg by mouth every 6 (six) hours as needed for mild pain or headache.   multivitamin with minerals Tabs tablet Take 1 tablet by mouth daily.   Pancrelipase (Lip-Prot-Amyl) 24000-76000 units Cpep Take 3 capsules (72,000 Units total) by mouth 3 (three) times daily before meals.      Disposition and follow-up instructions:   [name].  At the hospital follow up visit please address: - Alcohol and tobacco cessation, he has multiple issues due to his alcohol use - Continue pancreatic enzyme replacement - Follow up appointment with gastroenterology on 3/17 - He should be getting HH PT  Labs needed at follow-up: CMP to monitor LFTs and bilirubin, CBC to monitor chronic thrombocytopenia secondary to liver disease  Labs to follow-up: Vitamin E was checked for workup of ataxia, pending at time of discharge  Follow-up Appointments: Follow-up Information    Bing NeighborsHarris, Kimberly S, FNP Follow up on 12/18/2018.   Specialty:  Family Medicine Why:  9:50 for hospital follow up.  Contact information: [address] [place] [address] [phone]        Care, [facility].   Specialty:  Home Health Services Why:  HHPT Contact information: [address] [place] [address] [phone]          Brief history of present illness: Samuel [name] is a 58 year old man with a history of alcohol abuse and recurrent acute pancreatitis who presented with several weeks of abdominal pain, nausea, diarrhea, generalized weakness, and possible melenic stools.  Found to have a calcific pancreas with 7 mm stone in the pancreatic duct and 2:1 transaminitis.  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  Hospital course by problem list: 1. Chronic pancreatitis: secondary to ongoing alcohol and tobacco use.  On admission, vital signs were stable and exam was notable for abdominal tenderness without surgical signs.  He was placed on a clear liquid diet and pain was managed with acetaminophen.  MRCP was obtained which showed calcific pancreas and chronic dilation of the pancreatic duct due to known 7 mm stone.  Gastroenterology recommended conservative management and outpatient follow up.  His diet was advanced as tolerated with enzyme replacement, and abdominal pain was improved at the time of discharge.  2. Ataxia: secondary to ongoing alcohol use and deconditioning.  He says he first noticed this problem after being hospitalized for withdrawal seizures in December 2019.  On evaluation by physical therapy patient was found to have unsteady gait.  He also demonstrated subtle signs of cerebellar dysfunction on the left side on physical exam.  MRI was obtained which showed cerebellar degeneration consistent with chronic alcohol use, no acute findings.  Other workup was negative. Patient was discharged with plan for home health PT.  3. Alcohol abuse: patient reported  drinking 3 beers per day with last drink 3 days prior to admission.  He was initially placed on CIWA with lorazepam but did not show any signs of withdrawal during this hospitalization.  Patient was encouraged to quit drinking alcohol.  4. Transaminitis: labs  showed 2:1 transaminitis consistent with chronic alcohol use.  MRCP was reviewed and the liver showed hepatic steatosis vs siderotic nodules but no cirrhosis.  Liver enzymes demonstrated downward trend from time of admission.  5. Chronic thrombocytopenia: likely due to alcohol use and liver dysfunction.  Platelet count on admission was 44 which was consistent with labs from several months prior.  He did present with complaint of melenic stool but FOBT was negative and there were no further concerns for bleeds.   Discharge Vitals:   BP 107/63 (BP Location: Right Arm)   Pulse (!) 46   Temp 97.8 F (36.6 C) (Oral)   Resp (!) 25   Ht 5\' 5"  (1.651 m)   Wt 51 kg   SpO2 100%   BMI 18.71 kg/m   Pertinent Labs, Studies, and Procedures:  MRCP 2/10 impression: 1. Chronic calcific pancreatitis. Dilated dorsal pancreatic duct and terminated to a large calcification in the pancreatic head based on recent CT. This could be causing some level of chronic obstruction. There is atrophy of the pancreatic parenchyma in the pancreatic body with some questionable hypoenhancement although no appreciable mass or pancreatic necrosis. 2. Hepatic steatosis. There is some scattered fine nodularity which appears to dropout of signal more on the out of phase images and also are more conspicuous as low signal on T2 fat saturated images, probably reflecting a subtle nodular pattern of steatosis. Siderotic nodules might be a differential diagnostic consideration. 3. Periportal edema, nonspecific and often seen in a variety of conditions including heart failure, regional inflammation, secondary congestion, hepatitis, and a variety of other conditions. 4.  Aortic Atherosclerosis (ICD10-I70.0). 5. Distended gallbladder but with only borderline gallbladder wall thickening. No overt extrahepatic biliary dilatation. If further workup for cholecystitis is warranted by imaging, nuclear medicine pattern biliary scan could  be used to assess patency of the cystic duct.  MRI brain 2/12 impression: MRI of the brain demonstrates cerebellar greater than cerebral atrophy, but no acute intracranial findings. No abnormal postcontrast enhancement. No features suggestive of hepatic encephalopathy.  Suspected cervical spinal stenosis.If there are signs and symptoms of myelopathy, consider cervical spine MRI.  Discharge Instructions: Discharge Instructions    Call MD for:  difficulty breathing, headache or visual disturbances   Complete by:  As directed    Call MD for:  extreme fatigue   Complete by:  As directed    Call MD for:  hives   Complete by:  As directed    Call MD for:  persistant dizziness or light-headedness   Complete by:  As directed    Call MD for:  persistant nausea and vomiting   Complete by:  As directed    Call MD for:  redness, tenderness, or signs of infection (pain, swelling, redness, odor or green/yellow discharge around incision site)   Complete by:  As directed    Call MD for:  severe uncontrolled pain   Complete by:  As directed    Call MD for:  temperature >100.4   Complete by:  As directed    Diet - low sodium heart healthy   Complete by:  As directed    Diet - low sodium heart healthy   Complete by:  As directed  Discharge instructions   Complete by:  As directed    Samuel [name], It was nice meeting you.  You were hospitalized for a recurrent pancreatitis.  The gastroenterologist had evaluated you and he would like to follow-up with you outpatient. Please continue to take your Creon at home. [facility]. It is important for you to stop drinking alcohol, this is what is likely causing your recurrent pancreatitis. Please follow up with your PCP in 1-2 weeks and follow up with the Gastroenterologist.  We have also arranged for home health physical therapy to help with her strength.  We did an MRI that showed some atrophy of your cerebellum, this is likely  related to your alcohol use which is also why stopping drinking is important.   Increase activity slowly   Complete by:  As directed    Increase activity slowly   Complete by:  As directed       Signed: Tilda BurrowKerner, David S, Medical Student 11/22/2018, 12:55 PM   Attestation for Student Documentation:  I personally was present and performed or re-performed the history, physical exam and medical decision-making activities of this service and have verified that the service and findings are accurately documented in the student's note.  Claudean SeveranceKrienke, Marissa M, MD 11/23/2018, 4:58 PM

## 2018-11-22 NOTE — Progress Notes (Signed)
Subjective:  No acute events overnight.  Patient has mild abdominal discomfort but is tolerating a regular diet.  We discussed findings of MRI head and reinforced importance of not drinking alcohol as this is the cause of his pancreatitis and poor balance.  He acknowledged understanding but he does not seem genuinely committed to quitting.  His is aware that he has an appointment in March with gastroenterology for follow up.  Comfortable with plan for discharge today with HHPT. All questions were answered.   Objective:  Vital signs in last 24 hours: Vitals:   11/22/18 0004 11/22/18 0300 11/22/18 0425 11/22/18 0700  BP: 105/73  112/74 107/63  Pulse: (!) 53  (!) 45 (!) 46  Resp: 16  16 (!) 25  Temp: 98.4 F (36.9 C)  98.2 F (36.8 C) 97.8 F (36.6 C)  TempSrc: Oral  Oral Oral  SpO2: 99%   100%  Weight:  51 kg    Height:       Gen: well-appearing, lying in bed, no acute distress CV: regular rate and rhythm, no m/r/g Pulm: breathing comfortably on room air, lungs clear bilaterally Abd: soft and non-distended, minimal tenderness to palpation, no rebound or guarding, bowel sounds present Ext: 4x distal pulses palpable and symmetric, no lower extremity swelling  Assessment/Plan:  Samuel [name] is a 58 year old man with a history of alcohol abuse and recurrent acute pancreatitis who presented with several weeks of abdominal pain, nausea, diarrhea, generalized weakness, and possible melenic stools.  Found to have a 7 mm gallstone in the pancreatic duct and 2:1 transaminitis.  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  Active Problems:   Transaminitis   Pancreatic duct dilated   Pancreatic stones  Chronic pancreatitis: Malabsorption: Secondary to continued alcohol and tobacco use.  Known 7 mm stone in the main pancreatic duct with chronic dilation and calcific pancreas.  No interventions planned at this time, GI has  signed off and will follow outpatient to determine if procedure is necessary.  He is tolerating regular diet and abdominal pain is improved since admission. Advised to continue his home Creon, he has a prescription at the pharmacy.  -Continue home pancreatic enzyme replacement -Continue zofran PRN -Continue tylenol PRN -Continue imodium PRN -Medically ready for discharge today  Alcohol abuse Ataxic gait: Last drink 3 days prior to admission, no signs of withdrawal.  Hospitalized in early December with seizures likely due to alcohol dependence after which he developed ataxic gait and weakness.  On exam he has mild difficulty with cerebellar movements of his left side. MRI shows cerebellar degeneration consistent with chronic alcohol use.  B12 normal, vitamin E pending. -Follow up vitamin E on follow up -Continue thiamine 100 mg daily, switch to PO -Continue multivitamin, folic acid, B12 supplement  -PT recommends discharge to SNF or HHPT, patient declined, he is agreeable to home PT and case management assisted with setting this up.   Thrombocytopenia: Platelets 44 on admission, consistent with stable thrombocytopenia identified on prior blood work.  HIV and hepatitis panel negative in December.  Likely secondary to chronic liver disease and alcohol use.  He did give history of melenic stools but FOBT negative. He denies any other sources of bleeding. Hgb has been stable.  -Monitor for bleeds  Electrolyte abnormalities: magnesium given yesterday, WNL this morning.  Calcium chronically low and stable.  FEN: No fluids, replete electrolytes PRN, regular VTE ppx: SCDs Code status: full code  Dispo: Anticipated discharge is today.  Tilda BurrowKerner, David S, Medical Student 11/22/2018, 10:46 AM   Attestation for Student Documentation:  I personally was present and performed or re-performed the history, physical exam and medical decision-making activities of this service and have verified that the  service and findings are accurately documented in the student's note.  Claudean SeveranceKrienke, Marissa M, MD 11/22/2018, 11:36 AM

## 2018-11-22 NOTE — Clinical Social Work Note (Signed)
Pt is refusing SNF. RNCM notified. Clinical Social Worker will sign off for now as social work intervention is no longer needed. Please consult us again if new need arises.   Clarisse GougeBridget A Dantrell Schertzer 11/22/2018

## 2018-11-22 NOTE — Care Management (Signed)
#    5.   S / W Samuel [name] @  [facility]  RX #  [phone]   CREON  36,000 UNITS DAILY CO-PAY- 100 %  FOR PATIENT HAVEN'T UPDATE THERE INSURANCE PREMIUM  PATIENT NEED TO CONTACT  INSURANCE COMPANY  PREFERRED PHARMACY : [facility]

## 2018-11-25 LAB — VITAMIN E
VITAMIN E(GAMMA TOCOPHEROL): 0.7 mg/L (ref 0.5–5.5)
Vitamin E (Alpha Tocopherol): 3.7 mg/L — ABNORMAL LOW (ref 7.0–25.1)

## 2018-12-06 ENCOUNTER — Ambulatory Visit: Payer: BLUE CROSS/BLUE SHIELD | Admitting: Internal Medicine

## 2018-12-18 ENCOUNTER — Ambulatory Visit (INDEPENDENT_AMBULATORY_CARE_PROVIDER_SITE_OTHER): Payer: BLUE CROSS/BLUE SHIELD | Admitting: Family Medicine

## 2018-12-18 ENCOUNTER — Encounter: Payer: Self-pay | Admitting: Family Medicine

## 2018-12-18 VITALS — BP 128/78 | HR 56 | Temp 97.8°F | Resp 17 | Ht 65.0 in | Wt 116.8 lb

## 2018-12-18 DIAGNOSIS — K860 Alcohol-induced chronic pancreatitis: Secondary | ICD-10-CM | POA: Diagnosis not present

## 2018-12-18 DIAGNOSIS — E43 Unspecified severe protein-calorie malnutrition: Secondary | ICD-10-CM

## 2018-12-18 DIAGNOSIS — G312 Degeneration of nervous system due to alcohol: Secondary | ICD-10-CM

## 2018-12-18 DIAGNOSIS — R270 Ataxia, unspecified: Secondary | ICD-10-CM | POA: Diagnosis not present

## 2018-12-18 DIAGNOSIS — D696 Thrombocytopenia, unspecified: Secondary | ICD-10-CM | POA: Diagnosis not present

## 2018-12-18 DIAGNOSIS — F1020 Alcohol dependence, uncomplicated: Secondary | ICD-10-CM

## 2018-12-18 MED ORDER — MEGESTROL ACETATE 625 MG/5ML PO SUSP: 625.0000 mg | Freq: Every day | ORAL | 0 refills | Status: DC

## 2018-12-18 NOTE — Progress Notes (Signed)
Patient ID: Samuel [name], male    DOB: [birth year], 58 y.o.   MRN: [id number]  PCP: Bing Neighbors, FNP  Chief Complaint  Patient presents with  . Hospitalization Follow-up    ED->Hosp 2/9-2/13: chronic pancreatitis, pancreatic stones    Subjective:  HPI Samuel [name] is a 58 y.o. male presents for hospital follow-up. Patient with a history of chronic pancreatitis, and alcohol abuse, and severe protein malnutrition was admitted to inpatient services at [facility] with a complaint of abdominal pain, nausea, diarrhea and generalized weakness with possible bloody stool.  Patient was found to have a calcified pancreas with a 7 mm stone in the pancreatic duct and was found to be suffering from transaminitis. He was also noted to have some ataxia and unsteady balance.  A follow-up MRI of the brain showed that he suffers from cerebral atrophy. He was discharged following a 4-day inpatient stay.  Management of chronic pancreatitis would be followed outpatient by gastroenterology. Patient was advised to quit drinking, stop smoking and make efforts to eat a healthy diet in efforts to gain weight. Patient reports today that he has cut back on his alcohol consumption and is not drinking [name] day. He has also returned to work which has helped with reducing his alcohol intake. Patient's weight today is 116.8 lbs  which has only mildly increased since his last office visit. Body mass index is 19.44 kg/m.  Social History   Socioeconomic History  . Marital status: Divorced    Spouse name: Not on file  . Number of children: Not on file  . Years of education: Not on file  . Highest education level: Not on file  Occupational History  . Not on file  Social Needs  . Financial resource strain: Not on file  . Food insecurity:    Worry: Not on file    Inability: Not on file  . Transportation needs:    Medical: Not on file    Non-medical: Not on file  Tobacco Use  . Smoking status: Current  [name] Day Smoker    Packs/day: 1.00    Years: 45.00    Pack years: 45.00    Types: Cigarettes  . Smokeless tobacco: Never Used  Substance and Sexual Activity  . Alcohol use: Yes    Alcohol/week: 6.0 standard drinks    Types: 6 Cans of beer per week    Comment: DAILY  . Drug use: Not Currently  . Sexual activity: Not on file  Lifestyle  . Physical activity:    Days per week: Not on file    Minutes per session: Not on file  . Stress: Not on file  Relationships  . Social connections:    Talks on phone: Not on file    Gets together: Not on file    Attends religious service: Not on file    Active member of club or organization: Not on file    Attends meetings of clubs or organizations: Not on file    Relationship status: Not on file  . Intimate partner violence:    Fear of current or ex partner: Not on file    Emotionally abused: Not on file    Physically abused: Not on file    Forced sexual activity: Not on file  Other Topics Concern  . Not on file  Social History Narrative  . Not on file    Family History  Problem Relation Age of Onset  . Breast cancer Mother   .  Pancreatitis Neg Hx   . Liver disease Neg Hx   . Colon cancer Neg Hx   . Stomach cancer Neg Hx   . Esophageal cancer Neg Hx    Review of Systems  Pertinent negatives listed in HPI Patient Active Problem List   Diagnosis Date Noted  . Pancreatic stones   . Alcohol-induced chronic pancreatitis (HCC)   . Abnormal LFTs   . Pancreatic duct dilated 11/18/2018  . Transaminitis 09/11/2018  . HYPOKALEMIA 07/28/2010  . TOBACCO ABUSE 07/28/2010  . COCAINE ABUSE 07/28/2010  . NEUROPATHY 07/28/2010  . HYPONATREMIA 07/12/2010  . Alcohol abuse 07/12/2010  . Acute pancreatitis 07/12/2010    No Known Allergies  Prior to Admission medications   Medication Sig Start Date End Date Taking? Authorizing Provider  Albuterol Sulfate 108 (90 Base) MCG/ACT AEPB Inhale 2 puffs into the lungs [name] 4 (four) hours as  needed. 10/04/18  Yes Bing Neighbors, FNP  aspirin 325 MG tablet Take 325 mg by mouth [name] 6 (six) hours as needed for mild pain or headache.    Yes [provider]  Multiple Vitamin (MULTIVITAMIN WITH MINERALS) TABS tablet Take 1 tablet by mouth daily. 11/22/18  Yes Claudean Severance, MD  Pancrelipase, Lip-Prot-Amyl, 24000-76000 units CPEP Take 3 capsules (72,000 Units total) by mouth 3 (three) times daily before meals. 11/06/18  Yes Danis, Andreas Blower, MD   Past Medical, Surgical Family and Social History reviewed and updated.   Objective:   Today's Vitals   12/18/18 0950  BP: 128/78  Pulse: (!) 56  Resp: 17  Temp: 97.8 F (36.6 C)  TempSrc: Oral  SpO2: 99%  Weight: 116 lb 12.8 oz (53 kg)  Height: 5\' 5"  (1.651 m)    Wt Readings from Last 3 Encounters:  12/18/18 116 lb 12.8 oz (53 kg)  11/22/18 112 lb 7 oz (51 kg)  11/06/18 112 lb 9.6 oz (51.1 kg)    Physical Exam Constitutional: Patient appears well-developed and well-nourished. No distress. HENT: Normocephalic, atraumatic, External right and left ear normal.   Eyes: Conjunctivae and EOM are normal. PERRLA, no scleral icterus. Neck: Normal ROM. Neck supple. No JVD. No tracheal deviation. No thyromegaly. CVS: RRR, S1/S2 +, no murmurs, no gallops, no carotid bruit.  Pulmonary: Effort and breath sounds normal, no stridor, rhonchi, wheezes, rales.  Abdominal: Soft. BS +, no distension, tenderness, rebound or guarding.  Musculoskeletal: No gross deformities noted on exam. Neuro: Alert. Normal reflexes, muscle tone coordination. No cranial nerve deficit. Skin: Skin is warm and dry. No rash noted. Not diaphoretic. No erythema. No pallor. Psychiatric: Normal mood and affect. Behavior, judgment, thought content normal.   Lab Results  Component Value Date   HGBA1C 5.2 10/04/2018      Assessment & Plan:  1. Protein-calorie malnutrition, severe (HCC) -Continue pancreatic enzymes -Patient given samples of  ensure -advised to purchase ensure outpatient -Eat small frequent meals  2. Ataxia, PT ordered to assist with improving balance - Ambulatory referral to Physical Therapy  3. 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 Alcohol-induced chronic pancreatitis (HCC) - CBC with Differential - Comprehensive metabolic panel  5. Thrombocytopenia (HCC) - CBC with Differential  Meds ordered this encounter  Medications  . megestrol (MEGACE ES) 625 MG/5ML suspension    Sig: Take 5 mLs (625 mg total) by mouth daily.    Dispense:  150 mL    Refill:  0    -The patient was given  clear instructions to go to ER or return to medical center if symptoms do not improve, worsen or new problems develop. The patient verbalized understanding.    Joaquin Courts, FNP Primary Care at [facility] [address] [place] [address] [phone]: [phone]

## 2018-12-18 NOTE — Patient Instructions (Signed)
To help improve weight gain I recommend that you purchase Ensure or a generic form of Ensure and drink at least 2/day in addition to eating 3 meals per day.  Continue pancreatic enzymes as prescribed by your GI specialist.  Continue efforts to cut back alcohol use.  I have referred you to outpatient rehab for evaluation and management to improve balance.  Someone from their office will contact you to schedule an appointment that is convenient for you.   Protein-Energy Malnutrition Protein-energy malnutrition is when a person does not eat enough protein, fat, and calories. When this happens over time, it can lead to severe loss of muscle tissue (muscle wasting). This condition also affects the body's defense system (immune system) and can lead to other health problems. What are the causes? This condition may be caused by:  Not eating enough protein, fat, or calories.  Having certain chronic medical conditions.  Eating too little. What increases the risk? The following factors may make you more likely to develop this condition:  Living in poverty.  Long-term hospitalization.  Alcohol or drug dependency. Addiction often leads to a lifestyle in which proper diet is ignored. Dependency can also hurt the metabolism and the body's ability to absorb nutrients.  Eating disorders, such as anorexia nervosa or bulimia.  Chewing or swallowing problems. People with these disorders may not eat enough.  Having certain conditions, such as: ? Inflammatory bowel disease. Inflammation of the intestines makes it difficult for the body to absorb nutrients. ? Cancer or AIDS. These diseases can cause a loss of appetite. ? Chronic heart failure. This interferes with how the body uses nutrients. ? Cystic fibrosis. This disease can make it difficult for the body to absorb nutrients.  Eating a diet that extremely restricts protein, fat, or calorie intake. What are the signs or symptoms? Symptoms of this  condition include:  Fatigue.  Weakness.  Dizziness.  Fainting.  Weight loss.  Loss of muscle tone and muscle mass.  Poor immune response.  Lack of menstruation.  Poor memory.  Hair loss.  Skin changes. How is this diagnosed? This condition may be diagnosed based on:  Your medical and dietary history.  A physical exam. This may include a measurement of your body mass index (BMI).  Blood tests. How is this treated? This condition may be managed with:  Nutrition therapy. This may include working with a diet and nutrition specialist (dietitian).  Treatment for underlying conditions. People with severe protein-energy malnutrition may need to be treated in a hospital. This may involve receiving nutrition and fluids through an IV. Follow these instructions at home:   Eat a balanced diet. In each meal, include at least one food that is high in protein. Foods that are high in protein include: ? Meat. ? Poultry. ? Fish. ? Eggs. ? Cheese. ? Milk. ? Beans. ? Nuts.  Eat nutrient-rich foods that are easy to swallow and digest, such as: ? Fruit and yogurt smoothies. ? Oatmeal with nut butter.  Try to eat six small meals each day instead of three large meals.  Take vitamin and protein supplements as told by your health care provider or dietitian.  Follow your health care provider's recommendations about exercise and activity.  Keep all follow-up visits as told by your health care provider. This is important. Contact a health care provider if you:  Have increased weakness or fatigue.  Faint.  Are a woman and you stop having your period (menstruating).  Have rapid hair loss.  Have unexpected weight loss.  Have diarrhea.  Have nausea and vomiting. Get help right away if you have:  Difficulty breathing.  Chest pain. Summary  Protein-energy malnutrition is when a person does not eat enough protein, fat, and calories.  Protein-energy malnutrition can  lead to severe loss of muscle tissue (muscle wasting). This condition also affects the body's defense system (immune system) and can lead to other health problems.  Talk with your health care provider about treatment for this condition. Effective treatment depends on the underlying cause of the malnutrition. This information is not intended to replace advice given to you by your health care provider. Make sure you discuss any questions you have with your health care provider. Document Released: 08/12/2005 Document Revised: 10/11/2017 Document Reviewed: 10/11/2017 Elsevier Interactive Patient Education  2019 [employer].

## 2018-12-19 LAB — COMPREHENSIVE METABOLIC PANEL
ALT: 19 IU/L (ref 0–44)
AST: 29 IU/L (ref 0–40)
Albumin/Globulin Ratio: 1.2 (ref 1.2–2.2)
Albumin: 3.7 g/dL — ABNORMAL LOW (ref 3.8–4.9)
Alkaline Phosphatase: 72 IU/L (ref 39–117)
BUN/Creatinine Ratio: 10 (ref 9–20)
BUN: 7 mg/dL (ref 6–24)
Bilirubin Total: 0.4 mg/dL (ref 0.0–1.2)
CALCIUM: 9.4 mg/dL (ref 8.7–10.2)
CO2: 23 mmol/L (ref 20–29)
Chloride: 103 mmol/L (ref 96–106)
Creatinine, Ser: 0.72 mg/dL — ABNORMAL LOW (ref 0.76–1.27)
GFR, EST AFRICAN AMERICAN: 118 mL/min/{1.73_m2} (ref >59)
GFR, EST NON AFRICAN AMERICAN: 102 mL/min/{1.73_m2} (ref >59)
Globulin, Total: 3.2 g/dL (ref 1.5–4.5)
Glucose: 104 mg/dL — ABNORMAL HIGH (ref 65–99)
Potassium: 4.6 mmol/L (ref 3.5–5.2)
Sodium: 139 mmol/L (ref 134–144)
Total Protein: 6.9 g/dL (ref 6.0–8.5)

## 2018-12-19 LAB — CBC WITH DIFFERENTIAL/PLATELET
BASOS: 1 %
Basophils Absolute: 0.1 10*3/uL (ref 0.0–0.2)
EOS (ABSOLUTE): 0.2 10*3/uL (ref 0.0–0.4)
EOS: 5 %
Hematocrit: 34.5 % — ABNORMAL LOW (ref 37.5–51.0)
Hemoglobin: 11.4 g/dL — ABNORMAL LOW (ref 13.0–17.7)
Immature Grans (Abs): 0 10*3/uL (ref 0.0–0.1)
Immature Granulocytes: 0 %
Lymphocytes Absolute: 1.5 10*3/uL (ref 0.7–3.1)
Lymphs: 32 %
MCH: 31.4 pg (ref 26.6–33.0)
MCHC: 33 g/dL (ref 31.5–35.7)
MCV: 95 fL (ref 79–97)
Monocytes Absolute: 0.7 10*3/uL (ref 0.1–0.9)
Monocytes: 14 %
Neutrophils Absolute: 2.2 10*3/uL (ref 1.4–7.0)
Neutrophils: 48 %
Platelets: 226 10*3/uL (ref 150–450)
RBC: 3.63 x10E6/uL — ABNORMAL LOW (ref 4.14–5.80)
RDW: 13.7 % (ref 11.6–15.4)
WBC: 4.6 10*3/uL (ref 3.4–10.8)

## 2018-12-24 ENCOUNTER — Telehealth: Payer: Self-pay | Admitting: Family Medicine

## 2018-12-24 NOTE — Telephone Encounter (Signed)
Patient called requesting lab results, patient left number to call back  [phone].

## 2018-12-25 ENCOUNTER — Ambulatory Visit: Payer: BLUE CROSS/BLUE SHIELD | Admitting: Gastroenterology

## 2018-12-26 NOTE — Progress Notes (Signed)
Patient notified of results & recommendations. Expressed understanding.

## 2019-01-03 ENCOUNTER — Ambulatory Visit: Payer: BLUE CROSS/BLUE SHIELD | Admitting: Family Medicine

## 2019-01-11 ENCOUNTER — Ambulatory Visit: Payer: BLUE CROSS/BLUE SHIELD | Admitting: Gastroenterology

## 2019-02-18 ENCOUNTER — Ambulatory Visit (INDEPENDENT_AMBULATORY_CARE_PROVIDER_SITE_OTHER): Payer: BLUE CROSS/BLUE SHIELD | Admitting: Family Medicine

## 2019-02-18 ENCOUNTER — Other Ambulatory Visit: Payer: Self-pay

## 2019-02-18 ENCOUNTER — Encounter: Payer: Self-pay | Admitting: Family Medicine

## 2019-02-18 DIAGNOSIS — E43 Unspecified severe protein-calorie malnutrition: Secondary | ICD-10-CM | POA: Diagnosis not present

## 2019-02-18 DIAGNOSIS — K860 Alcohol-induced chronic pancreatitis: Secondary | ICD-10-CM | POA: Diagnosis not present

## 2019-02-18 NOTE — Progress Notes (Deleted)
Called patient to initiate their telephone visit with provider Joaquin Courts, FNP-C. Verified date of birth. Patient states that he has been doing fine since last visit. Feels like he has put on weight. KWalker, CMA.

## 2019-02-18 NOTE — Progress Notes (Signed)
Virtual Visit via Telephone Note  I connected with Samuel [name] on 02/18/19 at  9:10 AM EDT by telephone and verified that I am speaking with the correct person using two identifiers.  Location: Patient: Located at home during today's encounter  Provider: Located at primary care office    I discussed the limitations, risks, security and privacy concerns of performing an evaluation and management service by telephone and the availability of in person appointments. I also discussed with the patient that there may be a patient responsible charge related to this service. The patient expressed understanding and agreed to proceed.   History of Present Illness: Samuel [name] has HYPONATREMIA; HYPOKALEMIA; Alcohol abuse; TOBACCO ABUSE; COCAINE ABUSE; NEUROPATHY; Acute pancreatitis; Transaminitis; Pancreatic duct dilated; Alcohol-induced chronic pancreatitis (HCC); Abnormal LFTs; and Pancreatic stones on their problem list.  Samuel [name] reports that he has been doing well since last office visit. He feels he has gained weight, although he doesn't have access to a scale to weigh himself. He is able to eat and tolerate 3 meals per day. Continue drink at least 2 Ensures per day which was recommended during last office visit. No recent abdominal pain, nausea, vomiting, or loose stools . Continuing to make efforts to refrain from heavy alcohol consumption. Prior weight 116 lbs with a BMI 19.4.  Follow Up Instructions: Erectile dysfunction, weight check,  recheck labs including PSA, CBC (recent anemia)   I discussed the assessment and treatment plan with the patient. The patient was provided an opportunity to ask questions and all were answered. The patient agreed with the plan and demonstrated an understanding of the instructions.   The patient was advised to call back or seek an in-person evaluation if the symptoms worsen or if the condition fails to improve as anticipated.  I provided 15 minutes of  non-face-to-face time during this encounter.   Samuel Courts, FNP

## 2019-04-08 ENCOUNTER — Telehealth: Payer: Self-pay

## 2019-04-08 NOTE — Telephone Encounter (Signed)
Called patient to do their pre-visit COVID screening.  Call went to voicemail. Unable to do prescreening.  

## 2019-04-09 ENCOUNTER — Ambulatory Visit (INDEPENDENT_AMBULATORY_CARE_PROVIDER_SITE_OTHER): Payer: BC Managed Care – PPO | Admitting: Family Medicine

## 2019-04-09 ENCOUNTER — Telehealth: Payer: Self-pay | Admitting: Cardiology

## 2019-04-09 ENCOUNTER — Other Ambulatory Visit: Payer: Self-pay

## 2019-04-09 VITALS — BP 115/69 | HR 52 | Temp 98.6°F | Resp 17 | Ht 65.0 in | Wt 120.6 lb

## 2019-04-09 DIAGNOSIS — E46 Unspecified protein-calorie malnutrition: Secondary | ICD-10-CM

## 2019-04-09 DIAGNOSIS — N529 Male erectile dysfunction, unspecified: Secondary | ICD-10-CM

## 2019-04-09 DIAGNOSIS — R001 Bradycardia, unspecified: Secondary | ICD-10-CM

## 2019-04-09 MED ORDER — SILDENAFIL CITRATE 25 MG PO TABS: 25.0000 mg | ORAL_TABLET | Freq: Every day | ORAL | 3 refills | Status: DC | PRN

## 2019-04-09 NOTE — Progress Notes (Signed)
Patient ID: Samuel [name], male    DOB: [birth year], 58 y.o.   MRN: [id number]  PCP: Bing Neighbors, FNP  Chief Complaint  Patient presents with  . Weight Check    Subjective:  HPI Samuel [name] is a 58 y.o. male presents for weight evaluation.  Samuel [name] HYPONATREMIA; HYPOKALEMIA; Alcohol abuse; TOBACCO ABUSE; COCAINE ABUSE; NEUROPATHY; Acute pancreatitis; Transaminitis; Pancreatic duct dilated; Alcohol-induced chronic pancreatitis (HCC); Abnormal LFTs; and Pancreatic stones on their problem list.   Samuel [name] with a history of alcohol and cocaine abuse presents today for a weight evaluation. Patient weight has been less than 120 lbs since his admission in the hospital back in February for gallbladder disease.  Patient suffers from protein malnutrition.  He reports of recent he has been eating at least 3 meals per day.  He denies alcohol or drug use.  He reports taking a daily multivitamin only.  Of note patient's pulse today is 50.  In review of chart his last pulse when he was here was 56 but prior to that he has had 80s and 81.  He is asymptomatic of dizziness, weakness, chest pain, shortness of breath or fatigue.  Erectile dysfunction Patient had requested evaluation of ED symptoms during recent telemedicine encounter and this was stable for today's visit.  Patient endorses inability to achieve or maintain erection.  This is been going on over the course of 6 months.  He has never been previously treated with erectile dysfunction medications.  He has no underlying cardiac condition with the exception of asymptomatic sinus bradycardia. Social History   Socioeconomic History  . Marital status: Divorced    Spouse name: Not on file  . Number of children: Not on file  . Years of education: Not on file  . Highest education level: Not on file  Occupational History  . Not on file  Social Needs  . Financial resource strain: Not on file  . Food insecurity    Worry: Not on file     Inability: Not on file  . Transportation needs    Medical: Not on file    Non-medical: Not on file  Tobacco Use  . Smoking status: Current Every Day Smoker    Packs/day: 1.00    Years: 45.00    Pack years: 45.00    Types: Cigarettes  . Smokeless tobacco: Never Used  Substance and Sexual Activity  . Alcohol use: Yes    Alcohol/week: 6.0 standard drinks    Types: 6 Cans of beer per week    Comment: DAILY  . Drug use: Not Currently  . Sexual activity: Not on file  Lifestyle  . Physical activity    Days per week: Not on file    Minutes per session: Not on file  . Stress: Not on file  Relationships  . Social Musician on phone: Not on file    Gets together: Not on file    Attends religious service: Not on file    Active member of club or organization: Not on file    Attends meetings of clubs or organizations: Not on file    Relationship status: Not on file  . Intimate partner violence    Fear of current or ex partner: Not on file    Emotionally abused: Not on file    Physically abused: Not on file    Forced sexual activity: Not on file  Other Topics Concern  . Not on file  Social History  Narrative  . Not on file    Family History  Problem Relation Age of Onset  . Breast cancer Mother   . Pancreatitis Neg Hx   . Liver disease Neg Hx   . Colon cancer Neg Hx   . Stomach cancer Neg Hx   . Esophageal cancer Neg Hx     Review of Systems Pertinent negatives listed in HPI No Known Allergies  Prior to Admission medications   Medication Sig Start Date End Date Taking? Authorizing Provider  aspirin 325 MG tablet Take 325 mg by mouth every 6 (six) hours as needed for mild pain or headache.    Yes [provider]  Multiple Vitamin (MULTIVITAMIN WITH MINERALS) TABS tablet Take 1 tablet by mouth daily. 11/22/18  Yes Claudean Severance, MD    Past Medical, Surgical Family and Social History reviewed and updated.    Objective:   Today's Vitals    04/09/19 0904  BP: 115/69  Pulse: (!) 52  Resp: 17  Temp: 98.6 F (37 C)  TempSrc: Temporal  SpO2: 97%  Weight: 120 lb 9.6 oz (54.7 kg)  Height: 5\' 5"  (1.651 m)    BP Readings from Last 3 Encounters:  04/09/19 115/69  12/18/18 128/78  11/22/18 107/63    Filed Weights   04/09/19 0904  Weight: 120 lb 9.6 oz (54.7 kg)      Physical Exam  Constitutional: Patient appears well-developed and well-nourished. No distress. Eyes: Conjunctivae and EOM are normal. PERRLA, no scleral icterus. Neck: Normal ROM. Neck supple. No JVD. No tracheal deviation. No thyromegaly. CVS: Sinus Bradycardia, no murmurs, no gallops, no carotid bruit.  Pulmonary: Effort and breath sounds normal, no stridor, rhonchi, wheezes, rales.  Abdominal: Soft. BS +, no distension, tenderness, rebound or guarding.  Musculoskeletal: Normal range of motion. No edema and no tenderness.  Neuro: Alert. Normal reflexes, muscle tone coordination. No cranial nerve deficit. Skin: Skin is warm and dry. No rash noted. Not diaphoretic. No erythema. No pallor. Psychiatric: Normal mood and affect. Behavior, judgment, thought content normal. No results found for: POCGLU  Lab Results  Component Value Date   HGBA1C 5.2 10/04/2018       Assessment & Plan:  1. Bradycardia - EKG 12-Lead, 46 SB,  Consulted with Dr. [place], cardiologist[facility] at [facility] who reviewed ECG and noted patient experienced a similar decrease in pulse.  We discussed given that patient is asymptomatic and has no underlying known cardiovascular disease, patient does not require a cardiology consult at this time.  Had a long discussion with Samuel [name] explaining some of the symptoms associated with symptomatic sinus bradycardia.  He did verbalize understanding and the necessity to follow-up. - Comprehensive metabolic panel - Magnesium - CBC with Differential  2. Protein-calorie malnutrition, unspecified severity (HCC) Recent 4 lbs weight gain. -  Phosphorus  3. Erectile Dysfunction  -Trial sildenafil 25 mg 1 hour prior to sexual intercourse. The patient desires Viagra to treat his erectile dysfunction. History and physical exam has not disclosed any obvious treatable cause of this complaint. He is informed that Viagra is sometimes not covered by insurance. It is available on a fee-for-service cost basis, and is relatively expensive. He can start with 50 mg dose, and increase to 100 mg if necessary. The method of use 1 hour prior to anticipated intercourse is explained. He should not use any more than one tablet in a 24 hour period. The side effects of possible headache, flushing, dyspepsia and transient changes in vision have  been explained.  The patient is not taking nitrates, and denies he has access to nitrates in any form at any time. I have counseled him that taking Viagra with nitrates of any form can cause death. Additionally, Viagra serum concentrations can be increased by the following: cimetidine, erythromycin, itraconazole or ketoconazole. This patient does not take these drugs, but I have counseled him to avoid Viagra if he does take any of these.  We have also discussed the fact that there have been some deaths in patients after taking Viagra, felt due to the exertion of intercourse rather than the drug itself. The patient is aware of this, and accepts whatever unknown degree of risk there is in this aspect.   Samuel Courts, FNP-C Primary Care at [facility] [address], [place] [address] [phone]: [phone]

## 2019-04-09 NOTE — Telephone Encounter (Signed)
Discussed bradycardia with Molli Barrows NP. Patient with HR of 46. Completely asymptomatic. Similar HR earlier this year and also in 2018. No further work up required unless he develops symptoms.   Dorsey Charette Martin[place] MD, [facility]

## 2019-04-09 NOTE — Progress Notes (Deleted)
The patient desires Viagra to treat his erectile dysfunction. History and physical exam has not disclosed any obvious treatable cause of this complaint. He is informed that Viagra is sometimes not covered by insurance. It is available on a fee-for-service cost basis, and is relatively expensive. He can start with 25 mg dose, and increase to 100 mg if necessary. The method of use 1 hour prior to anticipated intercourse is explained. He should not use any more than one tablet in a 24 hour period. The side effects of possible headache, flushing, dyspepsia and transient changes in vision have been explained. Samples are ***given.  The patient is not taking nitrates, and denies he has access to nitrates in any form at any time. I have counseled him that taking Viagra with nitrates of any form can cause death. Additionally, Viagra serum concentrations can be increased by the following: cimetidine, erythromycin, itraconazole or ketoconazole. This patient does not take these drugs, but I have counseled him to avoid Viagra if he does take any of these.  We have also discussed the fact that there have been some deaths in patients after taking Viagra, felt due to the exertion of intercourse rather than the drug itself. The patient is aware of this, and accepts whatever unknown degree of risk there is in this aspect.

## 2019-04-09 NOTE — Patient Instructions (Signed)
Bradycardia, Adult °Bradycardia is a slower-than-normal heartbeat. A normal resting heart rate for an adult ranges from 60 to 100 beats per minute. With bradycardia, the resting heart rate is less than 60 beats per minute. °Bradycardia can prevent enough oxygen from reaching certain areas of your body when you are active. It can be serious if it keeps enough oxygen from reaching your brain and other parts of your body. Bradycardia is not a problem for everyone. For some healthy adults, a slow resting heart rate is normal. °What are the causes? °This condition may be caused by: °· A problem with the heart, including: °? A problem with the heart's electrical system, such as a heart block. With a heart block, electrical signals between the chambers of the heart are partially or completely blocked, so they are not able to work as they should. °? A problem with the heart's natural pacemaker (sinus node). °? Heart disease. °? A heart attack. °? Heart damage. °? Lyme disease. °? A heart infection. °? A heart condition that is present at birth (congenital heart defect). °· Certain medicines that treat heart conditions. °· Certain conditions, such as hypothyroidism and obstructive sleep apnea. °· Problems with the balance of chemicals and other substances, like potassium, in the blood. °· Trauma. °· Radiation therapy. °What increases the risk? °You are more likely to develop this condition if you: °· Are age 65 or older. °· Have high blood pressure (hypertension), high cholesterol (hyperlipidemia), or diabetes. °· Drink heavily, use tobacco or nicotine products, or use drugs. °What are the signs or symptoms? °Symptoms of this condition include: °· Light-headedness. °· Feeling faint or fainting. °· Fatigue and weakness. °· Trouble with activity or exercise. °· Shortness of breath. °· Chest pain (angina). °· Drowsiness. °· Confusion. °· Dizziness. °How is this diagnosed? °This condition may be diagnosed based on: °· Your  symptoms. °· Your medical history. °· A physical exam. °During the exam, your health care provider will listen to your heartbeat and check your pulse. To confirm the diagnosis, your health care provider may order tests, such as: °· Blood tests. °· An electrocardiogram (ECG). This test records the heart's electrical activity. The test can show how fast your heart is beating and whether the heartbeat is steady. °· A test in which you wear a portable device (event recorder or Holter monitor) to record your heart's electrical activity while you go about your day. °· An exercise test. °How is this treated? °Treatment for this condition depends on the cause of the condition and how severe your symptoms are. Treatment may involve: °· Treatment of the underlying condition. °· Changing your medicines or how much medicine you take. °· Having a small, battery-operated device called a pacemaker implanted under the skin. When bradycardia occurs, this device can be used to increase your heart rate and help your heart beat in a regular rhythm. °Follow these instructions at home: °Lifestyle ° °· Manage any health conditions that contribute to bradycardia as told by your health care provider. °· Follow a heart-healthy diet. A nutrition specialist (dietitian) can help educate you about healthy food options and changes. °· Follow an exercise program that is approved by your health care provider. °· Maintain a healthy weight. °· Try to reduce or manage your stress, such as with yoga or meditation. If you need help reducing stress, ask your health care provider. °· Do not use any products that contain nicotine or tobacco, such as cigarettes, e-cigarettes, and chewing tobacco. If you need help   quitting, ask your health care provider.  Do not use illegal drugs.  Limit alcohol intake to no more than 1 drink a day for nonpregnant women and 2 drinks a day for men. Be aware of how much alcohol is in your drink. In the [place] one drink  equals one 12 oz bottle of beer (355 mL), one 5 oz glass of wine (148 mL), or one 1 oz glass of hard liquor (44 mL). General instructions  Take over-the-counter and prescription medicines only as told by your health care provider.  Keep all follow-up visits as told by your health care provider. This is important. How is this prevented? In some cases, bradycardia may be prevented by:  Treating underlying medical problems.  Stopping behaviors or medicines that can trigger the condition. Contact a health care provider if you:  Feel light-headed or dizzy.  Almost faint.  Feel weak or are easily fatigued during physical activity.  Experience confusion or have memory problems. Get help right away if:  You faint.  You have: ? An irregular heartbeat (palpitations). ? Chest pain. ? Trouble breathing. Summary  Bradycardia is a slower-than-normal heartbeat. With bradycardia, the resting heart rate is less than 60 beats per minute.  Treatment for this condition depends on the cause.  Manage any health conditions that contribute to bradycardia as told by your health care provider.  Do not use any products that contain nicotine or tobacco, such as cigarettes, e-cigarettes, and chewing tobacco, and limit alcohol intake.  Keep all follow-up visits as told by your health care provider. This is important. This information is not intended to replace advice given to you by your health care provider. Make sure you discuss any questions you have with your health care provider. Document Released: 06/18/2002 Document Revised: 04/09/2018 Document Reviewed: 03/07/2018 Elsevier Patient Education  2020 [employer]    Erectile Dysfunction Erectile dysfunction (ED) is the inability to get or keep an erection in order to have sexual intercourse. Erectile dysfunction may include:  Inability to get an erection.  Lack of enough hardness of the erection to allow penetration.  Loss of the  erection before sex is finished. What are the causes? This condition may be caused by:  Certain medicines, such as: ? Pain relievers. ? Antihistamines. ? Antidepressants. ? Blood pressure medicines. ? Water pills (diuretics). ? Ulcer medicines. ? Muscle relaxants. ? Drugs.  Excessive drinking.  Psychological causes, such as: ? Anxiety. ? Depression. ? Sadness. ? Exhaustion. ? Performance fear. ? Stress.  Physical causes, such as: ? Artery problems. This may include diabetes, smoking, liver disease, or atherosclerosis. ? High blood pressure. ? Hormonal problems, such as low testosterone. ? Obesity. ? Nerve problems. This may include back or pelvic injuries, diabetes mellitus, multiple sclerosis, or Parkinson disease. What are the signs or symptoms? Symptoms of this condition include:  Inability to get an erection.  Lack of enough hardness of the erection to allow penetration.  Loss of the erection before sex is finished.  Normal erections at some times, but with frequent unsatisfactory episodes.  Low sexual satisfaction in either partner due to erection problems.  A curved penis occurring with erection. The curve may cause pain or the penis may be too curved to allow for intercourse.  Never having nighttime erections. How is this diagnosed? This condition is often diagnosed by:  Performing a physical exam to find other diseases or specific problems with the penis.  Asking you detailed questions about the problem.  Performing blood tests  to check for diabetes mellitus or to measure hormone levels.  Performing other tests to check for underlying health conditions.  Performing an ultrasound exam to check for scarring.  Performing a test to check blood flow to the penis.  Doing a sleep study at home to measure nighttime erections. How is this treated? This condition may be treated by:  Medicine taken by mouth to help you achieve an erection (oral  medicine).  Hormone replacement therapy to replace low testosterone levels.  Medicine that is injected into the penis. Your health care provider may instruct you how to give yourself these injections at home.  Vacuum pump. This is a pump with a ring on it. The pump and ring are placed on the penis and used to create pressure that helps the penis become erect.  Penile implant surgery. In this procedure, you may receive: ? An inflatable implant. This consists of cylinders, a pump, and a reservoir. The cylinders can be inflated with a fluid that helps to create an erection, and they can be deflated after intercourse. ? A semi-rigid implant. This consists of two silicone rubber rods. The rods provide some rigidity. They are also flexible, so the penis can both curve downward in its normal position and become straight for sexual intercourse.  Blood vessel surgery, to improve blood flow to the penis. During this procedure, a blood vessel from a different part of the body is placed into the penis to allow blood to flow around (bypass) damaged or blocked blood vessels.  Lifestyle changes, such as exercising more, losing weight, and quitting smoking. Follow these instructions at home: Medicines   Take over-the-counter and prescription medicines only as told by your health care provider. Do not increase the dosage without first discussing it with your health care provider.  If you are using self-injections, perform injections as directed by your health care provider. Make sure to avoid any veins that are on the surface of the penis. After giving an injection, apply pressure to the injection site for 5 minutes. General instructions  Exercise regularly, as directed by your health care provider. Work with your health care provider to lose weight, if needed.  Do not use any products that contain nicotine or tobacco, such as cigarettes and e-cigarettes. If you need help quitting, ask your health care  provider.  Before using a vacuum pump, read the instructions that come with the pump and discuss any questions with your health care provider.  Keep all follow-up visits as told by your health care provider. This is important. Contact a health care provider if:  You feel nauseous.  You vomit. Get help right away if:  You are taking oral or injectable medicines and you have an erection that lasts longer than 4 hours. If your health care provider is unavailable, go to the nearest emergency room for evaluation. An erection that lasts much longer than 4 hours can result in permanent damage to your penis.  You have severe pain in your groin or abdomen.  You develop redness or severe swelling of your penis.  You have redness spreading up into your groin or lower abdomen.  You are unable to urinate.  You experience chest pain or a rapid heart beat (palpitations) after taking oral medicines. Summary  Erectile dysfunction (ED) is the inability to get or keep an erection during sexual intercourse. This problem can usually be treated successfully.  This condition is diagnosed based on a physical exam, your symptoms, and tests to determine  the cause. Treatment varies depending on the cause, and may include medicines, hormone therapy, surgery, or vacuum pump.  You may need follow-up visits to make sure that you are using your medicines or devices correctly.  Get help right away if you are taking or injecting medicines and you have an erection that lasts longer than 4 hours. This information is not intended to replace advice given to you by your health care provider. Make sure you discuss any questions you have with your health care provider. Document Released: 09/23/2000 Document Revised: 09/08/2017 Document Reviewed: 10/12/2016 Elsevier Patient Education  2020 [employer]

## 2019-04-10 LAB — CBC WITH DIFFERENTIAL/PLATELET
Basophils Absolute: 0.1 10*3/uL (ref 0.0–0.2)
Basos: 1 %
EOS (ABSOLUTE): 0.2 10*3/uL (ref 0.0–0.4)
Eos: 3 %
Hematocrit: 38.9 % (ref 37.5–51.0)
Hemoglobin: 12.9 g/dL — ABNORMAL LOW (ref 13.0–17.7)
Immature Grans (Abs): 0 10*3/uL (ref 0.0–0.1)
Immature Granulocytes: 0 %
Lymphocytes Absolute: 1.9 10*3/uL (ref 0.7–3.1)
Lymphs: 36 %
MCH: 29.7 pg (ref 26.6–33.0)
MCHC: 33.2 g/dL (ref 31.5–35.7)
MCV: 90 fL (ref 79–97)
Monocytes Absolute: 0.7 10*3/uL (ref 0.1–0.9)
Monocytes: 13 %
Neutrophils Absolute: 2.4 10*3/uL (ref 1.4–7.0)
Neutrophils: 47 %
Platelets: 220 10*3/uL (ref 150–450)
RBC: 4.34 x10E6/uL (ref 4.14–5.80)
RDW: 13.7 % (ref 11.6–15.4)
WBC: 5.2 10*3/uL (ref 3.4–10.8)

## 2019-04-10 LAB — COMPREHENSIVE METABOLIC PANEL
ALT: 24 IU/L (ref 0–44)
AST: 32 IU/L (ref 0–40)
Albumin/Globulin Ratio: 1.5 (ref 1.2–2.2)
Albumin: 4.4 g/dL (ref 3.8–4.9)
Alkaline Phosphatase: 97 IU/L (ref 39–117)
BUN/Creatinine Ratio: 14 (ref 9–20)
BUN: 10 mg/dL (ref 6–24)
Bilirubin Total: 0.2 mg/dL (ref 0.0–1.2)
CO2: 22 mmol/L (ref 20–29)
Calcium: 9.5 mg/dL (ref 8.7–10.2)
Chloride: 103 mmol/L (ref 96–106)
Creatinine, Ser: 0.7 mg/dL — ABNORMAL LOW (ref 0.76–1.27)
GFR calc Af Amer: 119 mL/min/{1.73_m2} (ref >59)
GFR calc non Af Amer: 103 mL/min/{1.73_m2} (ref >59)
Globulin, Total: 2.9 g/dL (ref 1.5–4.5)
Glucose: 102 mg/dL — ABNORMAL HIGH (ref 65–99)
Potassium: 4.6 mmol/L (ref 3.5–5.2)
Sodium: 139 mmol/L (ref 134–144)
Total Protein: 7.3 g/dL (ref 6.0–8.5)

## 2019-04-10 LAB — MAGNESIUM: Magnesium: 1.7 mg/dL (ref 1.6–2.3)

## 2019-04-10 LAB — PHOSPHORUS: Phosphorus: 4.3 mg/dL — ABNORMAL HIGH (ref 2.8–4.1)

## 2019-04-18 ENCOUNTER — Telehealth: Payer: Self-pay | Admitting: Family Medicine

## 2019-04-18 MED ORDER — SILDENAFIL CITRATE 50 MG PO TABS: 50.0000 mg | ORAL_TABLET | Freq: Every day | ORAL | 3 refills | Status: DC | PRN

## 2019-04-18 NOTE — Progress Notes (Signed)
Patient notified of results & recommendations. Expressed understanding.

## 2019-04-18 NOTE — Telephone Encounter (Signed)
I will increase to Viagra 50 mg. If this dose is ineffective I recommend he be referred urology for evaluation of the source of his ED symptoms.

## 2019-04-18 NOTE — Telephone Encounter (Signed)
Patient notified of the information below. Expressed understanding. 

## 2019-04-18 NOTE — Telephone Encounter (Signed)
Patient called asking for refill on the viagra and he wants a stronger does of it

## 2019-05-21 ENCOUNTER — Ambulatory Visit: Payer: BC Managed Care – PPO | Admitting: Internal Medicine

## 2019-09-23 IMAGING — MR MR ABDOMEN WO/W CM MRCP
15 of 18 series · 43 of 48 positions shown · IV contrast (Contrast agent)
Comparison: 11/18/2018 CT

Addendum:
CLINICAL DATA: Pancreatitis and liver disease.

EXAM:
MRI ABDOMEN WITHOUT AND WITH CONTRAST (INCLUDING MRCP)
TECHNIQUE: Multiplanar multisequence MR imaging of the abdomen was performed
both before and after the administration of intravenous contrast.
Heavily T2-weighted images of the biliary and pancreatic ducts were
obtained, and three-dimensional MRCP images were rendered by post
processing.
Subtraction images are unavailable due to a scanner error.
CONTRAST:  5 cc Gadavist

[Series 4: bSSFP · coronal · 6.0mm · 0.66mm/px · 1 of 27 slices shown]
[im 1/27]
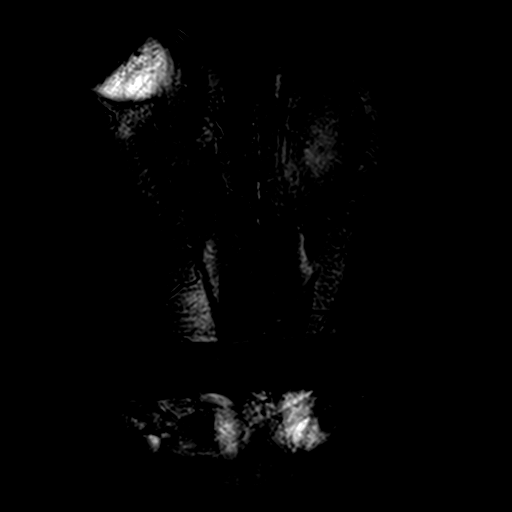

[Series 5: ax haste · axial · 6.0mm · 1.06mm/px · 1 of 34 slices shown]
[im 1/34]
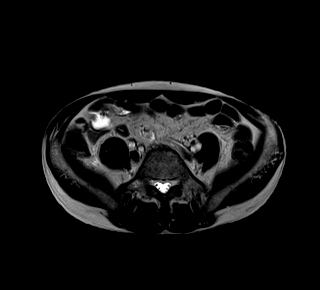

[Series 8: T2 fat-sat · axial · 6.0mm · 1.06mm/px · 1 of 30 slices shown]
[im 1/30]
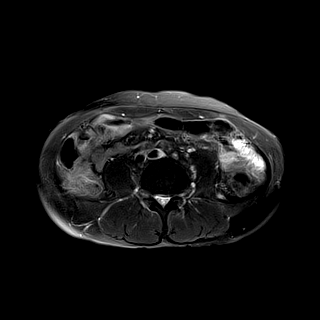

[Series 9: DWI · axial · 6.0mm · 1.42mm/px · z∈[-220,-11]mm · 4 of 90 slices shown (1 of 2)]
[im 1/90]
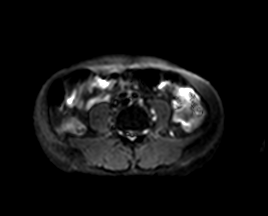
[im 30/90]
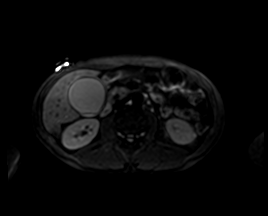
[im 60/90]
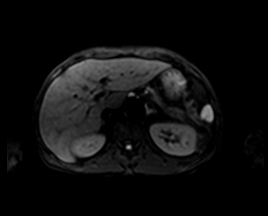
[im 90/90]
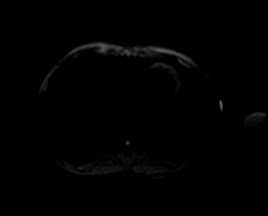

[Series 10: DWI · axial · 6.0mm · 1.42mm/px · 1 of 30 slices shown (2 of 2)]
[im 1/30]
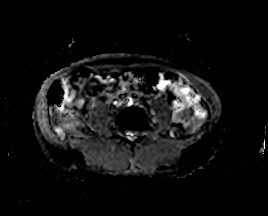

[Series 19: ax in and · axial · 3.0mm · 1.18mm/px · z∈[-273,-36]mm · 8 of 160 slices shown]
[im 1/160]
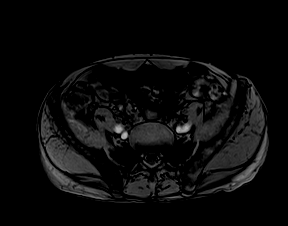
[im 23/160]
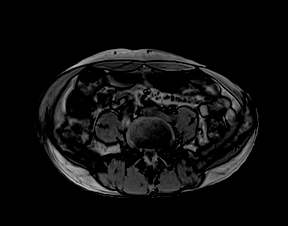
[im 46/160]
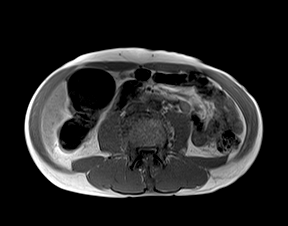
[im 69/160]
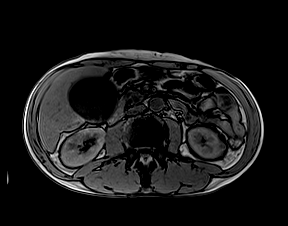
[im 91/160]
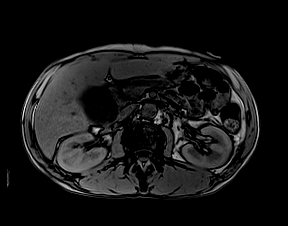
[im 114/160]
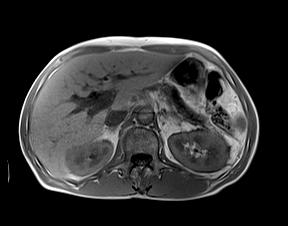
[im 137/160]
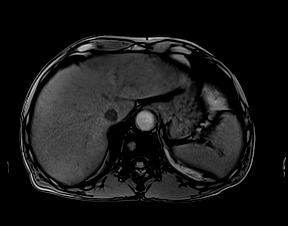
[im 160/160]
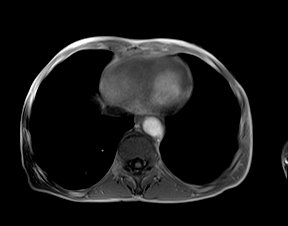

[Series 20: MRCP · coronal · 4.0mm · 1.12mm/px · 1 of 15 slices shown]
[im 1/15]
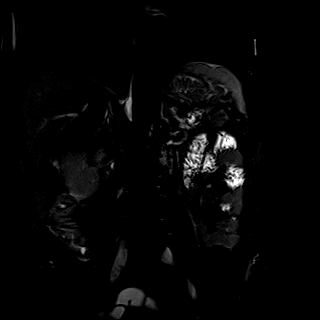

[Series 21: radials · coronal · 50.0mm · 0.78mm/px · 1 of 5 slices shown (1 of 2)]
[im 1/5]
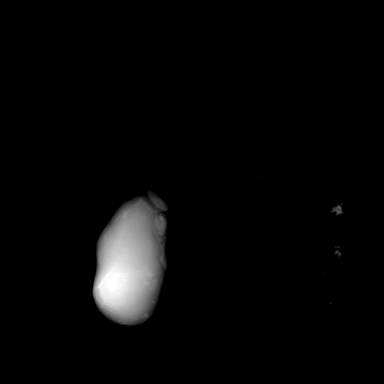

[Series 23: radials · coronal · 50.0mm · 0.78mm/px · 1 of 5 slices shown (2 of 2)]
[im 1/5]
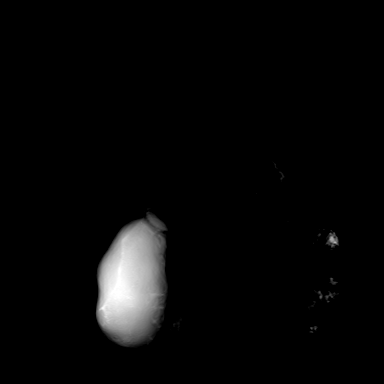

[Series 25: T1 dynamic · axial · non-contrast · 3.0mm · 1.18mm/px · z∈[-262,-25]mm · 4 of 80 slices shown]
[im 1/80]
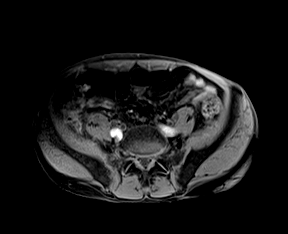
[im 27/80]
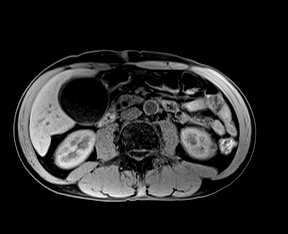
[im 53/80]
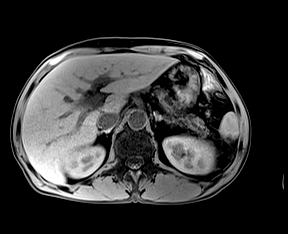
[im 80/80]
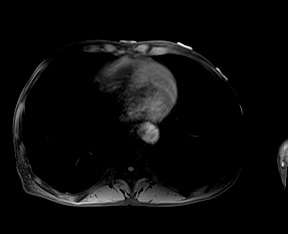

[Series 27: T1 dynamic post-contrast · axial · 3.0mm · 1.06mm/px · z∈[-262,-25]mm · 4 of 80 slices shown (1 of 5)]
[im 1/80]
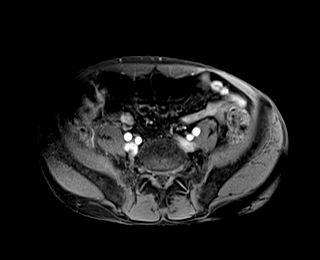
[im 27/80]
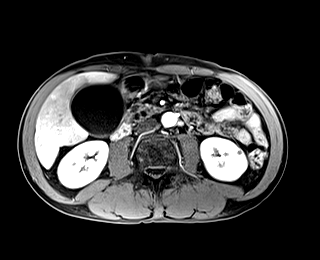
[im 53/80]
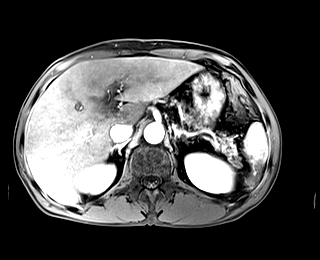
[im 80/80]
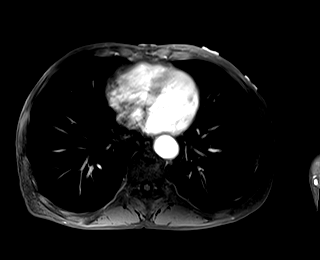

[Series 28: T1 dynamic post-contrast · axial · 3.0mm · 1.06mm/px · z∈[-262,-25]mm · 4 of 80 slices shown (2 of 5)]
[im 1/80]
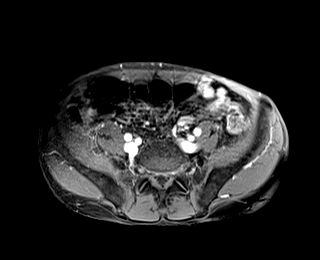
[im 27/80]
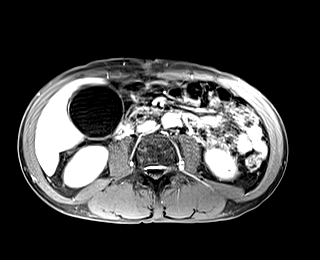
[im 53/80]
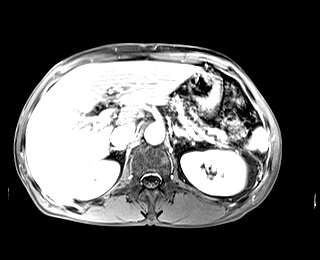
[im 80/80]
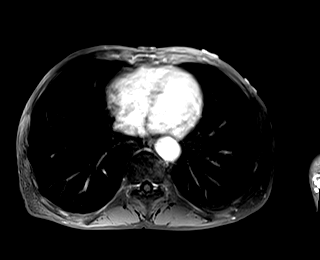

[Series 29: T1 dynamic post-contrast · axial · 3.0mm · 1.06mm/px · z∈[-262,-25]mm · 4 of 80 slices shown (3 of 5)]
[im 1/80]
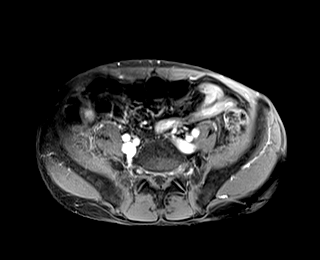
[im 27/80]
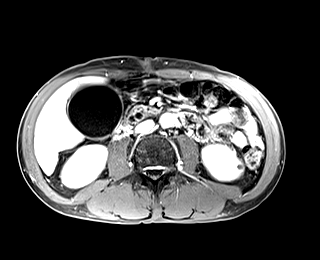
[im 53/80]
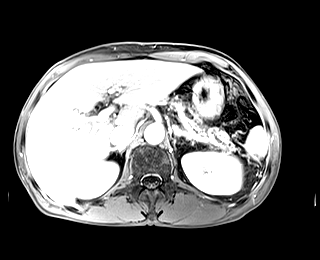
[im 80/80]
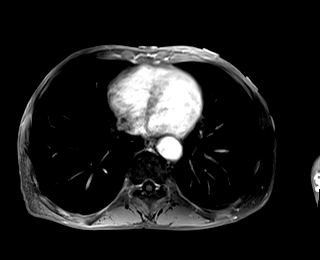

[Series 30: T1 dynamic post-contrast · axial · 3.0mm · 1.06mm/px · z∈[-262,-25]mm · 4 of 80 slices shown (4 of 5)]
[im 1/80]
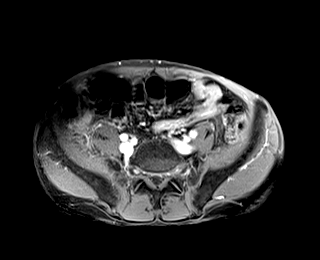
[im 27/80]
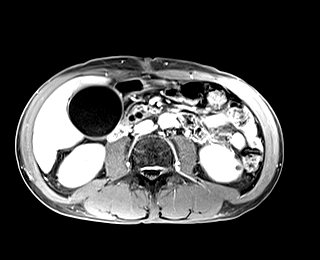
[im 53/80]
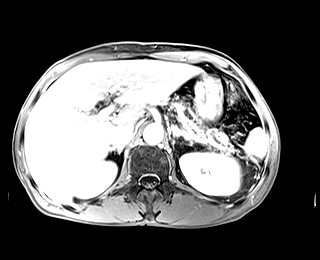
[im 80/80]
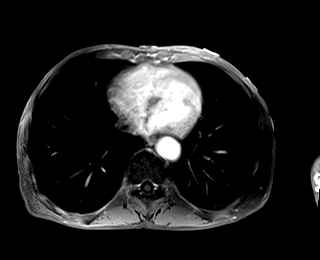

[Series 31: T1 dynamic post-contrast · coronal · 3.0mm · 1.25mm/px · 4 of 72 slices shown (5 of 5)]
[im 1/72]
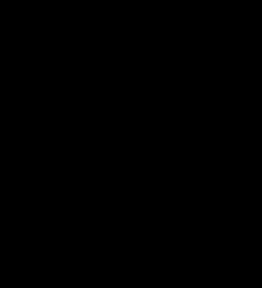
[im 24/72]
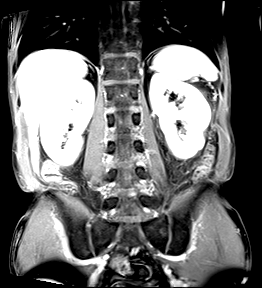
[im 48/72]
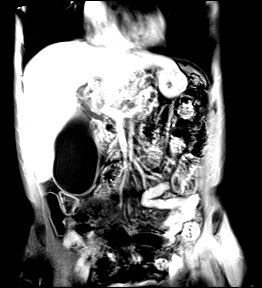
[im 72/72]
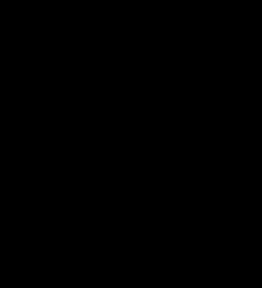

[43 of 48 positions shown; findings below may reference images not displayed]

FINDINGS: Lower chest: Unremarkable

Hepatobiliary: Mild diffuse hepatic steatosis. Distended gallbladder
with mild gallbladder wall thickening given the degree of
distension. Mild periportal edema. No overt extrahepatic biliary
dilatation.

Scattered tiny nodules in the liver with low fat saturated T2 signal
characteristics are inconspicuous generally low in signal on the T1
weighted images, more so on the out of phase images than the inphase
images. These are nonenhancing and fairly subtle. I favor this
representing a subtle nodular pattern steatosis overt siderotic
nodules.

Pancreas: Dilated dorsal pancreatic duct extending down to the known
calcification in the pancreatic head near the terminal margin of the
duct. Calcification is of course more conspicuous on CT. Pancreatic
parenchyma in the body and head of the pancreas is thin and
potentially mildly hypoenhancing. The pancreatic tail, but this does
not appear to be the region narrowing. There no findings of
pancreatic necrosis. Chronic calcific pancreatitis.

Spleen:  Unremarkable

Adrenals/Urinary Tract: Adrenal glands appear normal. Partially
calcified cystic lesion of the left kidney lower pole compatible
with Bosniak category 2 cyst. Several other tiny renal cysts are
present. No worrisome renal lesions.

Stomach/Bowel: Unremarkable

Vascular/Lymphatic: Aortoiliac atherosclerotic vascular disease. No
pathologic adenopathy in the upper abdomen.

Other:  No supplemental non-categorized findings.

Musculoskeletal: Lower lumbar degenerative disc disease.
IMPRESSION: 1. Chronic calcific pancreatitis. Dilated dorsal pancreatic duct and
terminated to a large calcification in the pancreatic head based on
recent CT. This could be causing some level of chronic obstruction.
There is atrophy of the pancreatic parenchyma in the pancreatic body
with some questionable hypoenhancement although no appreciable mass
or pancreatic necrosis.
2. Hepatic steatosis. There is some scattered fine nodularity which
appears to dropout of signal more on the out of phase images and
also are more conspicuous as low signal on T2 fat saturated images,
probably reflecting a subtle nodular pattern of steatosis. Siderotic
nodules might be a differential diagnostic consideration.
3. Periportal edema, nonspecific and often seen in a variety of
conditions including heart failure, regional inflammation, secondary
congestion, hepatitis, and a variety of other conditions.
4.  Aortic Atherosclerosis ([id number]).
5. Distended gallbladder but with only borderline gallbladder wall
thickening. No overt extrahepatic biliary dilatation. If further
workup for cholecystitis is warranted by imaging, nuclear medicine
pattern biliary scan could be used to assess patency of the cystic
duct.

ADDENDUM:
The original report was by Dr. Micah Haller. The following
addendum is by Dr. Micah Haller:

Additional T2 weighted images in the form of series 4 have been sent
to PACS several minutes after the original dictation. I have
reviewed these and they can demonstrate a faint granularity of tiny
low T2 signal nodules in the liver which could be small siderotic
nodules or conceivably a tiny foci of steatosis. Given that these
nodules are low in signal on the non fat saturated T2 weighted
images, siderotic nodules are favored.

*** End of Addendum ***

## 2020-12-14 ENCOUNTER — Other Ambulatory Visit: Payer: Self-pay

## 2020-12-14 ENCOUNTER — Ambulatory Visit (HOSPITAL_COMMUNITY): Admission: EM | Admit: 2020-12-14 | Discharge: 2020-12-14 | Discharge status: Against Medical Advice | Payer: Self-pay

## 2020-12-14 ENCOUNTER — Emergency Department (HOSPITAL_COMMUNITY)
Admission: EM | Admit: 2020-12-14 | Discharge: 2020-12-14 | Disposition: A | Discharge status: Home / Self Care | Payer: Self-pay | Attending: Emergency Medicine | Admitting: Emergency Medicine

## 2020-12-14 DIAGNOSIS — W1812XA Fall from or off toilet with subsequent striking against object, initial encounter: Secondary | ICD-10-CM | POA: Insufficient documentation

## 2020-12-14 DIAGNOSIS — R531 Weakness: Secondary | ICD-10-CM | POA: Insufficient documentation

## 2020-12-14 DIAGNOSIS — R251 Tremor, unspecified: Secondary | ICD-10-CM | POA: Insufficient documentation

## 2020-12-14 DIAGNOSIS — F17210 Nicotine dependence, cigarettes, uncomplicated: Secondary | ICD-10-CM | POA: Insufficient documentation

## 2020-12-14 DIAGNOSIS — Y92002 Bathroom of unspecified non-institutional (private) residence single-family (private) house as the place of occurrence of the external cause: Secondary | ICD-10-CM | POA: Insufficient documentation

## 2020-12-14 LAB — COMPREHENSIVE METABOLIC PANEL
ALT: 71 U/L — ABNORMAL HIGH (ref 0–44)
AST: 164 U/L — ABNORMAL HIGH (ref 15–41)
Albumin: 4 g/dL (ref 3.5–5.0)
Alkaline Phosphatase: 61 U/L (ref 38–126)
Anion gap: 14 (ref 5–15)
BUN: 6 mg/dL — ABNORMAL LOW (ref 8–23)
CO2: 22 mmol/L (ref 22–32)
Calcium: 8.9 mg/dL (ref 8.9–10.3)
Chloride: 102 mmol/L (ref 98–111)
Creatinine, Ser: 0.68 mg/dL (ref 0.61–1.24)
GFR, Estimated: >60 mL/min (ref >60)
Glucose, Bld: 105 mg/dL — ABNORMAL HIGH (ref 70–99)
Potassium: 3.6 mmol/L (ref 3.5–5.1)
Sodium: 138 mmol/L (ref 135–145)
Total Bilirubin: 1.2 mg/dL (ref 0.3–1.2)
Total Protein: 7.5 g/dL (ref 6.5–8.1)

## 2020-12-14 LAB — CBC WITH DIFFERENTIAL/PLATELET
Abs Immature Granulocytes: 0.01 10*3/uL (ref 0.00–0.07)
Basophils Absolute: 0.1 10*3/uL (ref 0.0–0.1)
Basophils Relative: 2 %
Eosinophils Absolute: 0 10*3/uL (ref 0.0–0.5)
Eosinophils Relative: 1 %
HCT: 41.5 % (ref 39.0–52.0)
Hemoglobin: 14 g/dL (ref 13.0–17.0)
Immature Granulocytes: 0 %
Lymphocytes Relative: 41 %
Lymphs Abs: 1.4 10*3/uL (ref 0.7–4.0)
MCH: 32.8 pg (ref 26.0–34.0)
MCHC: 33.7 g/dL (ref 30.0–36.0)
MCV: 97.2 fL (ref 80.0–100.0)
Monocytes Absolute: 0.4 10*3/uL (ref 0.1–1.0)
Monocytes Relative: 11 %
Neutro Abs: 1.5 10*3/uL — ABNORMAL LOW (ref 1.7–7.7)
Neutrophils Relative %: 45 %
Platelets: 100 10*3/uL — ABNORMAL LOW (ref 150–400)
RBC: 4.27 MIL/uL (ref 4.22–5.81)
RDW: 13.3 % (ref 11.5–15.5)
WBC: 3.4 10*3/uL — ABNORMAL LOW (ref 4.0–10.5)
nRBC: 0 % (ref 0.0–0.2)
nRBC: 0 /100 WBC

## 2020-12-14 LAB — URINALYSIS, ROUTINE W REFLEX MICROSCOPIC
Bacteria, UA: NONE SEEN
Bilirubin Urine: NEGATIVE
Glucose, UA: NEGATIVE mg/dL
Hgb urine dipstick: NEGATIVE
Ketones, ur: 5 mg/dL — AB
Leukocytes,Ua: NEGATIVE
Nitrite: NEGATIVE
Protein, ur: 30 mg/dL — AB
Specific Gravity, Urine: 1.017 (ref 1.005–1.030)
pH: 5 (ref 5.0–8.0)

## 2020-12-14 MED ORDER — SODIUM CHLORIDE 0.9 % IV BOLUS
1000.0000 mL | Freq: Once | INTRAVENOUS | Status: AC
  Administered 2020-12-14: 1000 mL via INTRAVENOUS

## 2020-12-14 NOTE — ED Notes (Signed)
No answer when called for registration

## 2020-12-14 NOTE — ED Notes (Signed)
No answer when called for in waiting room, outside at front entrance and in parking lot. Pt greeter stated that the pt had walked outside just moments before called.

## 2020-12-14 NOTE — ED Provider Notes (Signed)
[facility] Provider Note   CSN: [id number] Arrival date & time: 12/14/20  1016     History Chief Complaint  Patient presents with  . Fall    Samuel [name] is a 60 y.o. male with past medical history of alcohol induced chronic pancreatitis that presents emergency department today for fall/weakness/profuse shaking.  Patient states that he fell last Tuesday, 7 days ago hitting his head.  States that he came in today because he having continued occasional bleeding from his head and had some shaking.  Did not lose consciousness, is not any blood thinners.  I evaluated patient's head, there is no blood or hematoma or mark resembling a fall, spoke to patient about this, he states that he is actually here because he is a weak, and he has had profuse shaking.  Patient has intermittent shaking, when I left the patient's bedside and was able to observe him, shaking subsided.  Movements appear purposeful.  Patient states that he fell because he tripped over something in his bathroom, hit his head on the heater.  Denies any other recent falls.  Denies any nausea or vomiting.  Denies abdominal pain.  Denies any diarrhea.  Denies any fevers or chills.  Denies pain anywhere, no chest pain or shortness of breath, leg swelling.  Patient lives alone at home.  States that he does continue to drink alcohol daily, about 4-6 beers a day, no other substance use.  HPI     Past Medical History:  Diagnosis Date  . Elevated LFTs   . Pancreatitis     Patient Active Problem List   Diagnosis Date Noted  . Pancreatic stones   . Alcohol-induced chronic pancreatitis (HCC)   . Abnormal LFTs   . Pancreatic duct dilated 11/18/2018  . Transaminitis 09/11/2018  . HYPOKALEMIA 07/28/2010  . TOBACCO ABUSE 07/28/2010  . COCAINE ABUSE 07/28/2010  . NEUROPATHY 07/28/2010  . HYPONATREMIA 07/12/2010  . Alcohol abuse 07/12/2010  . Acute pancreatitis 07/12/2010    Past Surgical  History:  Procedure Laterality Date  . NO PAST SURGERIES         Family History  Problem Relation Age of Onset  . Breast cancer Mother   . Pancreatitis Neg Hx   . Liver disease Neg Hx   . Colon cancer Neg Hx   . Stomach cancer Neg Hx   . Esophageal cancer Neg Hx     Social History   Tobacco Use  . Smoking status: Current Every Day Smoker    Packs/day: 1.00    Years: 45.00    Pack years: 45.00    Types: Cigarettes  . Smokeless tobacco: Never Used  Vaping Use  . Vaping Use: Never used  Substance Use Topics  . Alcohol use: Yes    Alcohol/week: 6.0 standard drinks    Types: 6 Cans of beer per week    Comment: DAILY  . Drug use: Not Currently    Home Medications Prior to Admission medications   Medication Sig Start Date End Date Taking? Authorizing Provider  aspirin 325 MG tablet Take 325 mg by mouth every 6 (six) hours as needed for mild pain or headache.     [provider]  Multiple Vitamin (MULTIVITAMIN WITH MINERALS) TABS tablet Take 1 tablet by mouth daily. 11/22/18   Claudean SeveranceKrienke, Marissa M, MD  sildenafil (VIAGRA) 50 MG tablet Take 1 tablet (50 mg total) by mouth daily as needed for erectile dysfunction. 04/18/19   Bing NeighborsHarris, Kimberly S,  FNP    Allergies    Patient has no known allergies.  Review of Systems   Review of Systems  Constitutional: Negative for chills, diaphoresis, fatigue and fever.  HENT: Negative for congestion, sore throat and trouble swallowing.   Eyes: Negative for pain and visual disturbance.  Respiratory: Negative for cough, shortness of breath and wheezing.   Cardiovascular: Negative for chest pain, palpitations and leg swelling.  Gastrointestinal: Negative for abdominal distention, abdominal pain, diarrhea, nausea and vomiting.  Genitourinary: Negative for difficulty urinating.  Musculoskeletal: Negative for back pain, neck pain and neck stiffness.  Skin: Negative for pallor.  Neurological: Positive for weakness. Negative for dizziness,  speech difficulty and headaches.  Psychiatric/Behavioral: Negative for confusion.    Physical Exam Updated Vital Signs BP 133/74 (BP Location: Right Arm)   Pulse 66   Temp 97.7 F (36.5 C) (Oral)   Resp 16   SpO2 100%   Physical Exam Constitutional:      General: He is not in acute distress.    Appearance: Normal appearance. He is not ill-appearing, toxic-appearing or diaphoretic.  HENT:     Head: Normocephalic.      Comments: Patient does have some tenderness in this area, no visible hematoma or laceration.  No dried or crusting blood.  No obvious laceration to entire skull.    Mouth/Throat:     Mouth: Mucous membranes are moist.     Pharynx: Oropharynx is clear.  Eyes:     General: No scleral icterus.    Extraocular Movements: Extraocular movements intact.     Pupils: Pupils are equal, round, and reactive to light.  Cardiovascular:     Rate and Rhythm: Normal rate and regular rhythm.     Pulses: Normal pulses.     Heart sounds: Normal heart sounds.  Pulmonary:     Effort: Pulmonary effort is normal. No respiratory distress.     Breath sounds: Normal breath sounds. No stridor. No wheezing, rhonchi or rales.  Chest:     Chest wall: No tenderness.  Abdominal:     General: Abdomen is flat. There is no distension.     Palpations: Abdomen is soft.     Tenderness: There is no abdominal tenderness. There is no guarding or rebound.  Musculoskeletal:        General: No swelling or tenderness. Normal range of motion.     Cervical back: Normal range of motion and neck supple. No rigidity.     Right lower leg: No edema.     Left lower leg: No edema.  Skin:    General: Skin is warm and dry.     Capillary Refill: Capillary refill takes less than 2 seconds.     Coloration: Skin is not pale.  Neurological:     General: No focal deficit present.     Mental Status: He is alert and oriented to person, place, and time.     Comments: Alert. Clear speech. No facial droop. CNIII-XII  grossly intact. Bilateral upper and lower extremities' sensation grossly intact. 5/5 symmetric strength with grip strength and with plantar and dorsi flexion bilaterally. Normal finger to nose bilaterally. Negative pronator drift. Negative Romberg sign. Gait is steady and intact.    Psychiatric:        Mood and Affect: Mood normal.        Behavior: Behavior normal.     ED Results / Procedures / Treatments   Labs (all labs ordered are listed, but only abnormal results are displayed)  Labs Reviewed  CBC WITH DIFFERENTIAL/PLATELET - Abnormal; Notable for the following components:      Result Value   WBC 3.4 (*)    Platelets 100 (*)    Neutro Abs 1.5 (*)    All other components within normal limits  COMPREHENSIVE METABOLIC PANEL - Abnormal; Notable for the following components:   Glucose, Bld 105 (*)    BUN 6 (*)    AST 164 (*)    ALT 71 (*)    All other components within normal limits  URINALYSIS, ROUTINE W REFLEX MICROSCOPIC - Abnormal; Notable for the following components:   Ketones, ur 5 (*)    Protein, ur 30 (*)    All other components within normal limits    EKG EKG Interpretation  Date/Time:  Monday December 14 2020 11:34:31 EST Ventricular Rate:  47 PR Interval:  176 QRS Duration: 108 QT Interval:  452 QTC Calculation: 400 R Axis:   -40 Text Interpretation: Sinus bradycardia Left axis deviation Cannot rule out Anteroseptal infarct , age undetermined Abnormal ECG No significant change since last tracing Confirmed by Lorre Nick ([id number]) on 12/14/2020 2:57:42 PM   Radiology No results found.  Procedures Procedures   Medications Ordered in ED Medications  sodium chloride 0.9 % bolus 1,000 mL (0 mLs Intravenous Stopped 12/14/20 1330)    ED Course  I have reviewed the triage vital signs and the nursing notes.  Pertinent labs & imaging results that were available during my care of the patient were reviewed by me and considered in my medical decision making (see chart  for details).    MDM Rules/Calculators/A&P                          DMITRI PETTIGREW is a 60 y.o. male with past medical history of alcohol induced chronic pancreatitis that presents emerge department today for fall/weakness/profuse shaking.  Patient fell last week, no laceration or bleeding to head, normal neuro exam. No need for imaging.  Patient does have some profuse shaking on exam, shaking will subside when I leave bedside.  Patient has purposeful movements of his upper extremities and his face, when he is engaged in activity shaking subsides.  When I told patient that he does not have any laceration or blood on his head, he states that he is actually here for weakness.  No weakness on exam, normal gait.  However will obtain basic labs for weakness, otherwise benign physical exam with normal strength throughout.  Suspect patient to be discharged with PCP follow-up.  Work-up today unremarkable, however AST and ALT elevated most likely due to chronic drinking with low platelets, which appears to be patient's baseline a couple years ago.  Patient also has some leukopenia,not far off in patient's baseline.  Will have patient follow-up with PCP for repeat blood draws.  Patient agreeable for plan, patient be discharged.  Doubt need for further emergent work up at this time. I explained the diagnosis and have given explicit precautions to return to the ER including for any other new or worsening symptoms. The patient understands and accepts the medical plan as it's been dictated and I have answered their questions. Discharge instructions concerning home care and prescriptions have been given. The patient is STABLE and is discharged to home in good condition.  I discussed this case with my attending physician who cosigned this note including patient's presenting symptoms, physical exam, and planned diagnostics and interventions. Attending physician stated agreement  with plan or made changes to plan which  were implemented.   Attending physician assessed patient at bedside.  Final Clinical Impression(s) / ED Diagnoses Final diagnoses:  Weakness    Rx / DC Orders ED Discharge Orders    None       Farrel Gordon, PA-C 12/14/20 1528    Lorre Nick, MD 12/16/20 1024

## 2020-12-14 NOTE — Discharge Instructions (Addendum)
Your work up today was reassuring, I want you to follow-up with your PCP.  Make sure to stay hydrated.  Your liver enzymes were elevated today, most likely from drinking.  Please try to drink as little as possible.  If you have any new or worsening concerning symptom please come back to the emergency department.  Please have your CBC and liver enzymes rechecked with your PCP in the next couple of days since your platelets and your white blood count were also low.  Get help right away if you: Develop sudden weakness, especially on one side of your face or body. Have chest pain. Have trouble breathing or shortness of breath. Have problems with your vision. Have trouble talking or swallowing. Have trouble standing or walking. Are light-headed or lose consciousness.

## 2020-12-14 NOTE — ED Notes (Signed)
Pt d/c home per MD order. Discharge summary reviewed with pt, pt verbalizes understanding. Ambulatory off unit. No s/s of acute distress noted.  

## 2020-12-14 NOTE — ED Triage Notes (Addendum)
Pt reports falling when getting off the toilet last Tuesday, hitting his head. Not on any blood thinners. C/o continued occasional bleeding on his head and intermittent shaking. Denies LOC.

## 2020-12-15 ENCOUNTER — Telehealth: Payer: Self-pay | Admitting: *Deleted

## 2020-12-15 NOTE — Telephone Encounter (Signed)
Transition Care Management Unsuccessful Follow-up Telephone Call  Date of discharge and from where:  [facility] [place]  Attempts: 1st  Reason for unsuccessful TCM follow-up call:  No answer/ left message

## 2020-12-16 ENCOUNTER — Telehealth: Payer: Self-pay | Admitting: *Deleted

## 2020-12-16 NOTE — Telephone Encounter (Signed)
Transition Care Management Unsuccessful Follow-up Telephone Call  Date of discharge and from where:  [facility] [place]  Attempts:  2nd  Reason for unsuccessful TCM follow-up call: no answer lm

## 2020-12-17 ENCOUNTER — Telehealth: Payer: Self-pay | Admitting: *Deleted

## 2020-12-17 NOTE — Telephone Encounter (Signed)
Transition Care Management Follow-up Telephone Call  Date of discharge and from where: 12/14/2020 - [facility]  How have you been since you were released from the hospital? "Feeling better"  Any questions or concerns? No  Items Reviewed:  Did the pt receive and understand the discharge instructions provided? Yes   Medications obtained and verified? Yes   Other? No   Any new allergies since your discharge? No   Dietary orders reviewed? Yes  Do you have support at home? Yes   Home Care and Equipment/Supplies: Were home health services ordered? not applicable If so, what is the name of the agency? N/A  Has the agency set up a time to come to the patient's home? not applicable Were any new equipment or medical supplies ordered?  No What is the name of the medical supply agency? N/A Were you able to get the supplies/equipment? N/A Do you have any questions related to the use of the equipment or supplies? No  Functional Questionnaire: (I = Independent and D = Dependent) ADLs: I  Bathing/Dressing- I  Meal Prep- I  Eating- I  Maintaining continence- I  Transferring/Ambulation- I  Managing Meds- I  Follow up appointments reviewed:   PCP Hospital f/u appt confirmed? No    Specialist Hospital f/u appt confirmed? No    Are transportation arrangements needed? No   If their condition worsens, is the pt aware to call PCP or go to the Emergency Dept.? Yes  Was the patient provided with contact information for the PCP's office or ED? Yes  Was to pt encouraged to call back with questions or concerns? Yes

## 2021-01-18 NOTE — Progress Notes (Signed)
Subjective:    LOCHLIN EPPINGER - 60 y.o. male MRN [id number]  Date of birth: 06/22/1961  HPI  JEHIEL KOEPP is to establish care. Patient has a PMH significant for pancreatic duct dilated, alcohol-induced chronic pancreatitis, pancreatic stones, acute pancreatitis, neuropathy, transaminitis, abnormal LFTs, hyponatremia, hypokalemia, alcohol abuse, tobacco abuse, and cocaine abuse.   Current issues and/or concerns:  Visit 12/14/2020 at [facility] Emergency Department per MD note: LUCAS WINOGRAD is a 60 y.o. male with past medical history of alcohol induced chronic pancreatitis that presents emerge department today for fall/weakness/profuse shaking.  Patient fell last week, no laceration or bleeding to head, normal neuro exam. No need for imaging.  Patient does have some profuse shaking on exam, shaking will subside when I leave bedside.  Patient has purposeful movements of his upper extremities and his face, when he is engaged in activity shaking subsides.  When I told patient that he does not have any laceration or blood on his head, he states that he is actually here for weakness.  No weakness on exam, normal gait.  However will obtain basic labs for weakness, otherwise benign physical exam with normal strength throughout.  Suspect patient to be discharged with PCP follow-up.  Work-up today unremarkable, however AST and ALT elevated most likely due to chronic drinking with low platelets, which appears to be patient's baseline a couple years ago.  Patient also has some leukopenia,not far off in patient's baseline.  Will have patient follow-up with PCP for repeat blood draws.  Patient agreeable for plan, patient be discharged.  Doubt need for further emergent work up at this time. I explained the diagnosis and have given explicit precautions to return to the ER including for any other new or worsening symptoms. The patient understands and accepts the medical plan as it's been  dictated and I have answered their questions. Discharge instructions concerning home care and prescriptions have been given. The patient is STABLE and is discharged to home in good condition.  I discussed this case with my attending physician who cosigned this note including patient's presenting symptoms, physical exam, and planned diagnostics and interventions. Attending physician stated agreement with plan or made changes to plan which were implemented.   Attending physician assessed patient at bedside.  01/19/2021: Reports he is about the same since hospital discharge. Still having no feeling in fingers, weakness, difficulty standing, and shaking.  Reports down to about 2 cans of beer on some days.   ROS per HPI   Health Maintenance:  Health Maintenance Due  Topic Date Due  . COLONOSCOPY (Pts 45-3yrs Insurance coverage will need to be confirmed)  Never done     Past Medical History: Patient Active Problem List   Diagnosis Date Noted  . Pancreatic stones   . Alcohol-induced chronic pancreatitis (HCC)   . Abnormal LFTs   . Pancreatic duct dilated 11/18/2018  . Transaminitis 09/11/2018  . HYPOKALEMIA 07/28/2010  . TOBACCO ABUSE 07/28/2010  . COCAINE ABUSE 07/28/2010  . NEUROPATHY 07/28/2010  . HYPONATREMIA 07/12/2010  . Alcohol abuse 07/12/2010  . Acute pancreatitis 07/12/2010     Social History   reports that he has been smoking cigarettes. He has a 45.00 pack-year smoking history. He has never used smokeless tobacco. He reports current alcohol use of about 6.0 standard drinks of alcohol per week. He reports previous drug use.   Family History  family history includes Breast cancer in his mother.   Medications: reviewed and updated  Objective:   Physical Exam BP 134/77 (BP Location: Left Arm, Patient Position: Sitting)   Pulse 78   Ht 5' 5.35" (1.66 m)   Wt 106 lb 12.8 oz (48.4 kg)   SpO2 97%   BMI 17.58 kg/m  Physical Exam Eyes:     Extraocular  Movements: Extraocular movements intact.     Conjunctiva/sclera: Conjunctivae normal.     Pupils: Pupils are equal, round, and reactive to light.  Cardiovascular:     Rate and Rhythm: Normal rate and regular rhythm.     Pulses: Normal pulses.     Heart sounds: Normal heart sounds.  Pulmonary:     Effort: Pulmonary effort is normal.     Breath sounds: Normal breath sounds.  Musculoskeletal:     Cervical back: Normal range of motion and neck supple.  Neurological:     General: No focal deficit present.     Mental Status: He is alert and oriented to person, place, and time.  Psychiatric:        Mood and Affect: Mood normal.        Behavior: Behavior normal.         Assessment & Plan:  1. Encounter to establish care: - Patient presents today to establish care.  - Return for annual physical examination, labs, and health maintenance. Arrive fasting meaning having no for at least 8 hours prior to appointment. You may have only water or black coffee. Please take scheduled medications as normal.  2. Weakness: - Stable today in office. No evidence of respiratory, cardiovascular, or neurological distress.  - Patient's symptoms of weakness and shaking may be related to alcohol consumption.  - Referral to Neurology for further evaluation and management.  - Ambulatory referral to Neurology  3. Financial difficulty: - Offered patient [place] financial discount/orange card and blue card application. Counseled patient will need to have an appointment with the financial counselor for processing of documentation. Patient agreeable.    Patient was given clear instructions to go to Emergency Department or return to medical center if symptoms don't improve, worsen, or new problems develop.The patient verbalized understanding.  I discussed the assessment and treatment plan with the patient. The patient was provided an opportunity to ask questions and all were answered. The patient agreed with the  plan and demonstrated an understanding of the instructions.   The patient was advised to call back or seek an in-person evaluation if the symptoms worsen or if the condition fails to improve as anticipated.    Ricky Stabs, NP 01/19/2021, 12:36 PM Primary Care at [facility]

## 2021-01-19 ENCOUNTER — Other Ambulatory Visit: Payer: Self-pay

## 2021-01-19 ENCOUNTER — Ambulatory Visit (INDEPENDENT_AMBULATORY_CARE_PROVIDER_SITE_OTHER): Payer: Self-pay | Admitting: Family

## 2021-01-19 ENCOUNTER — Encounter: Payer: Self-pay | Admitting: Family

## 2021-01-19 VITALS — BP 134/77 | HR 78 | Ht 65.35 in | Wt 106.8 lb

## 2021-01-19 DIAGNOSIS — Z7689 Persons encountering health services in other specified circumstances: Secondary | ICD-10-CM

## 2021-01-19 DIAGNOSIS — R531 Weakness: Secondary | ICD-10-CM

## 2021-01-19 DIAGNOSIS — Z599 Problem related to housing and economic circumstances, unspecified: Secondary | ICD-10-CM

## 2021-01-19 NOTE — Progress Notes (Signed)
Establish care Still expreincing imbalance and dizziness from fall

## 2021-01-19 NOTE — Patient Instructions (Signed)
- Return for annual physical examination, labs, and health maintenance. Arrive fasting meaning having no for at least 8 hours prior to appointment. You may have only water or black coffee. Please take scheduled medications as normal. - Apply for [facility] Financial Discount/orange card application. Schedule appointment with Jenene Slicker.  - Referral to Neurology.  Thank you for choosing Primary Care at [facility] for your medical home!    Samuel [name] was seen by Rema Fendt, NP today.   Samuel [name] Reasons's primary care provider is Rema Fendt, NP.   For the best care possible,  you should try to see Ricky Stabs, NP whenever you come to clinic.   We look forward to seeing you again soon!  If you have any questions about your visit today,  please call Samuel [name] at [phone]  Or feel free to reach your provider via MyChart.    DASH Eating Plan DASH stands for Dietary Approaches to Stop Hypertension. The DASH eating plan is a healthy eating plan that has been shown to:  Reduce high blood pressure (hypertension).  Reduce your risk for type 2 diabetes, heart disease, and stroke.  Help with weight loss. What are tips for following this plan? Reading food labels  Check food labels for the amount of salt (sodium) per serving. Choose foods with less than 5 percent of the Daily Value of sodium. Generally, foods with less than 300 milligrams (mg) of sodium per serving fit into this eating plan.  To find whole grains, look for the word "whole" as the first word in the ingredient list. Shopping  Buy products labeled as "low-sodium" or "no salt added."  Buy fresh foods. Avoid canned foods and pre-made or frozen meals. Cooking  Avoid adding salt when cooking. Use salt-free seasonings or herbs instead of table salt or sea salt. Check with your health care provider or pharmacist before using salt substitutes.  Do not fry foods. Cook foods using healthy methods such as baking,  boiling, grilling, roasting, and broiling instead.  Cook with heart-healthy oils, such as olive, canola, avocado, soybean, or sunflower oil. Meal planning  Eat a balanced diet that includes: ? 4 or more servings of fruits and 4 or more servings of vegetables each day. Try to fill one-half of your plate with fruits and vegetables. ? 6-8 servings of whole grains each day. ? Less than 6 oz (170 g) of lean meat, poultry, or fish each day. A 3-oz (85-g) serving of meat is about the same size as a deck of cards. One egg equals 1 oz (28 g). ? 2-3 servings of low-fat dairy each day. One serving is 1 cup (237 mL). ? 1 serving of nuts, seeds, or beans 5 times each week. ? 2-3 servings of heart-healthy fats. Healthy fats called omega-3 fatty acids are found in foods such as walnuts, flaxseeds, fortified milks, and eggs. These fats are also found in cold-water fish, such as sardines, salmon, and mackerel.  Limit how much you eat of: ? Canned or prepackaged foods. ? Food that is high in trans fat, such as some fried foods. ? Food that is high in saturated fat, such as fatty meat. ? Desserts and other sweets, sugary drinks, and other foods with added sugar. ? Full-fat dairy products.  Do not salt foods before eating.  Do not eat more than 4 egg yolks a week.  Try to eat at least 2 vegetarian meals a week.  Eat more home-cooked food and less  restaurant, buffet, and fast food.   Lifestyle  When eating at a restaurant, ask that your food be prepared with less salt or no salt, if possible.  If you drink alcohol: ? Limit how much you use to:  0-1 drink a day for women who are not pregnant.  0-2 drinks a day for men. ? Be aware of how much alcohol is in your drink. In the [place] one drink equals one 12 oz bottle of beer (355 mL), one 5 oz glass of wine (148 mL), or one 1 oz glass of hard liquor (44 mL). General information  Avoid eating more than 2,300 mg of salt a day. If you have hypertension,  you may need to reduce your sodium intake to 1,500 mg a day.  Work with your health care provider to maintain a healthy body weight or to lose weight. Ask what an ideal weight is for you.  Get at least 30 minutes of exercise that causes your heart to beat faster (aerobic exercise) most days of the week. Activities may include walking, swimming, or biking.  Work with your health care provider or dietitian to adjust your eating plan to your individual calorie needs. What foods should I eat? Fruits All fresh, dried, or frozen fruit. Canned fruit in natural juice (without added sugar). Vegetables Fresh or frozen vegetables (raw, steamed, roasted, or grilled). Low-sodium or reduced-sodium tomato and vegetable juice. Low-sodium or reduced-sodium tomato sauce and tomato paste. Low-sodium or reduced-sodium canned vegetables. Grains Whole-grain or whole-wheat bread. Whole-grain or whole-wheat pasta. Brown rice. Orpah Cobb. Bulgur. Whole-grain and low-sodium cereals. Pita bread. Low-fat, low-sodium crackers. Whole-wheat flour tortillas. Meats and other proteins Skinless chicken or [place]. Ground chicken or [place]. Pork with fat trimmed off. Fish and seafood. Egg whites. Dried beans, peas, or lentils. Unsalted nuts, nut butters, and seeds. Unsalted canned beans. Lean cuts of beef with fat trimmed off. Low-sodium, lean precooked or cured meat, such as sausages or meat loaves. Dairy Low-fat (1%) or fat-free (skim) milk. Reduced-fat, low-fat, or fat-free cheeses. Nonfat, low-sodium ricotta or cottage cheese. Low-fat or nonfat yogurt. Low-fat, low-sodium cheese. Fats and oils Soft margarine without trans fats. Vegetable oil. Reduced-fat, low-fat, or light mayonnaise and salad dressings (reduced-sodium). Canola, safflower, olive, avocado, soybean, and sunflower oils. Avocado. Seasonings and condiments Herbs. Spices. Seasoning mixes without salt. Other foods Unsalted popcorn and pretzels. Fat-free  sweets. The items listed above may not be a complete list of foods and beverages you can eat. Contact a dietitian for more information. What foods should I avoid? Fruits Canned fruit in a light or heavy syrup. Fried fruit. Fruit in cream or butter sauce. Vegetables Creamed or fried vegetables. Vegetables in a cheese sauce. Regular canned vegetables (not low-sodium or reduced-sodium). Regular canned tomato sauce and paste (not low-sodium or reduced-sodium). Regular tomato and vegetable juice (not low-sodium or reduced-sodium). Rosita Fire. Olives. Grains Baked goods made with fat, such as croissants, muffins, or some breads. Dry pasta or rice meal packs. Meats and other proteins Fatty cuts of meat. Ribs. Fried meat. Tomasa Blase. Bologna, salami, and other precooked or cured meats, such as sausages or meat loaves. Fat from the back of a pig (fatback). Bratwurst. Salted nuts and seeds. Canned beans with added salt. Canned or smoked fish. Whole eggs or egg yolks. Chicken or [place] with skin. Dairy Whole or 2% milk, cream, and half-and-half. Whole or full-fat cream cheese. Whole-fat or sweetened yogurt. Full-fat cheese. Nondairy creamers. Whipped toppings. Processed cheese and cheese spreads. Fats and oils Butter.  Stick margarine. Lard. Shortening. Ghee. Bacon fat. Tropical oils, such as coconut, palm kernel, or palm oil. Seasonings and condiments Onion salt, garlic salt, seasoned salt, table salt, and sea salt. Worcestershire sauce. Tartar sauce. Barbecue sauce. Teriyaki sauce. Soy sauce, including reduced-sodium. Steak sauce. Canned and packaged gravies. Fish sauce. Oyster sauce. Cocktail sauce. Store-bought horseradish. Ketchup. Mustard. Meat flavorings and tenderizers. Bouillon cubes. Hot sauces. Pre-made or packaged marinades. Pre-made or packaged taco seasonings. Relishes. Regular salad dressings. Other foods Salted popcorn and pretzels. The items listed above may not be a complete list of foods and  beverages you should avoid. Contact a dietitian for more information. Where to find more information  National Heart, Lung, and Blood Institute: PopSteam.is  American Heart Association: www.heart.org  Academy of Nutrition and Dietetics: www.eatright.org  National Kidney Foundation: www.kidney.org Summary  The DASH eating plan is a healthy eating plan that has been shown to reduce high blood pressure (hypertension). It may also reduce your risk for type 2 diabetes, heart disease, and stroke.  When on the DASH eating plan, aim to eat more fresh fruits and vegetables, whole grains, lean proteins, low-fat dairy, and heart-healthy fats.  With the DASH eating plan, you should limit salt (sodium) intake to 2,300 mg a day. If you have hypertension, you may need to reduce your sodium intake to 1,500 mg a day.  Work with your health care provider or dietitian to adjust your eating plan to your individual calorie needs. This information is not intended to replace advice given to you by your health care provider. Make sure you discuss any questions you have with your health care provider. Document Revised: 08/30/2019 Document Reviewed: 08/30/2019 Elsevier Patient Education  2021 [employer].

## 2021-03-02 NOTE — Progress Notes (Signed)
Patient ID: Samuel [name], male    DOB: 1961-01-02  MRN: [id number]  CC: Annual Physical Exam   Subjective: Samuel [name] is a 60 y.o. male who presents for annual physical exam.   His concerns today include: none.  Patient Active Problem List   Diagnosis Date Noted  . Pancreatic stones   . Alcohol-induced chronic pancreatitis (HCC)   . Abnormal LFTs   . Pancreatic duct dilated 11/18/2018  . Transaminitis 09/11/2018  . HYPOKALEMIA 07/28/2010  . TOBACCO ABUSE 07/28/2010  . COCAINE ABUSE 07/28/2010  . NEUROPATHY 07/28/2010  . HYPONATREMIA 07/12/2010  . Alcohol abuse 07/12/2010  . Acute pancreatitis 07/12/2010     Current Outpatient Medications on File Prior to Visit  Medication Sig Dispense Refill  . aspirin 325 MG tablet Take 325 mg by mouth every 6 (six) hours as needed for mild pain or headache.     . Multiple Vitamin (MULTIVITAMIN WITH MINERALS) TABS tablet Take 1 tablet by mouth daily. 30 tablet 0  . sildenafil (VIAGRA) 50 MG tablet Take 1 tablet (50 mg total) by mouth daily as needed for erectile dysfunction. 10 tablet 3   No current facility-administered medications on file prior to visit.    No Known Allergies  Social History   Socioeconomic History  . Marital status: Divorced    Spouse name: Not on file  . Number of children: Not on file  . Years of education: Not on file  . Highest education level: Not on file  Occupational History  . Not on file  Tobacco Use  . Smoking status: Current Every Day Smoker    Packs/day: 1.00    Years: 45.00    Pack years: 45.00    Types: Cigarettes  . Smokeless tobacco: Never Used  Vaping Use  . Vaping Use: Never used  Substance and Sexual Activity  . Alcohol use: Yes    Alcohol/week: 6.0 standard drinks    Types: 6 Cans of beer per week    Comment: DAILY  . Drug use: Not Currently  . Sexual activity: Not on file  Other Topics Concern  . Not on file  Social History Narrative  . Not on file   Social  Determinants of Health   Financial Resource Strain: Not on file  Food Insecurity: Not on file  Transportation Needs: Not on file  Physical Activity: Not on file  Stress: Not on file  Social Connections: Not on file  Intimate Partner Violence: Not on file    Family History  Problem Relation Age of Onset  . Breast cancer Mother   . Pancreatitis Neg Hx   . Liver disease Neg Hx   . Colon cancer Neg Hx   . Stomach cancer Neg Hx   . Esophageal cancer Neg Hx     Past Surgical History:  Procedure Laterality Date  . NO PAST SURGERIES      ROS: Review of Systems Negative except as stated above  PHYSICAL EXAM: BP 107/73 (BP Location: Left Arm, Patient Position: Sitting, Cuff Size: Normal)   Pulse 67   Temp 98.6 F (37 C)   Resp 16   Ht 5' 5.35" (1.66 m)   Wt 109 lb 9.6 oz (49.7 kg)   SpO2 99%   BMI 18.04 kg/m   Physical Exam HENT:     Head: Normocephalic and atraumatic.     Right Ear: Tympanic membrane, ear canal and external ear normal.     Left Ear: Tympanic membrane, ear canal and  external ear normal.  Eyes:     Extraocular Movements: Extraocular movements intact.     Conjunctiva/sclera: Conjunctivae normal.     Pupils: Pupils are equal, round, and reactive to light.  Cardiovascular:     Rate and Rhythm: Normal rate and regular rhythm.     Pulses: Normal pulses.     Heart sounds: Normal heart sounds.  Pulmonary:     Effort: Pulmonary effort is normal.     Breath sounds: Normal breath sounds.  Abdominal:     General: Bowel sounds are normal.     Palpations: Abdomen is soft.  Genitourinary:    Comments: Patient declined examination.  Musculoskeletal:        General: Normal range of motion.     Cervical back: Normal range of motion.  Skin:    General: Skin is warm and dry.     Capillary Refill: Capillary refill takes less than 2 seconds.  Neurological:     General: No focal deficit present.     Mental Status: He is alert and oriented to person, place, and  time.  Psychiatric:        Mood and Affect: Mood normal.        Behavior: Behavior normal.     ASSESSMENT AND PLAN: 1. Annual physical exam: - Counseled on 150 minutes of exercise per week as tolerated, healthy eating (including decreased daily intake of saturated fats, cholesterol, added sugars, sodium), STI prevention, and routine healthcare maintenance.  2. Screening for metabolic disorder: - CMP to check kidney function, liver function, and electrolyte balance.  - Comprehensive metabolic panel  3. Screening for deficiency anemia: - CBC to screen for anemia. - CBC  4. Diabetes mellitus screening: - Hemoglobin A1c to screen for pre-diabetes/diabetes. - Hemoglobin A1c  5. Screening cholesterol level: - Lipid panel to screen for high cholesterol.  - Lipid panel   6. Thyroid disorder screen: - TSH to check thyroid function.  - TSH  7. Screening for colon cancer: - Referral to Gastroenterology for colon cancer screening by colonoscopy. - Ambulatory referral to Gastroenterology   Patient was given the opportunity to ask questions.  Patient verbalized understanding of the plan and was able to repeat key elements of the plan. Patient was given clear instructions to go to Emergency Department or return to medical center if symptoms don't improve, worsen, or new problems develop.The patient verbalized understanding.   Orders Placed This Encounter  Procedures  . Comprehensive metabolic panel  . CBC  . Lipid panel  . TSH  . Hemoglobin A1c  . Ambulatory referral to Gastroenterology    Follow-up with primary provider as scheduled.   Rema Fendt, NP

## 2021-03-03 ENCOUNTER — Ambulatory Visit (INDEPENDENT_AMBULATORY_CARE_PROVIDER_SITE_OTHER): Payer: Self-pay | Admitting: Family

## 2021-03-03 ENCOUNTER — Other Ambulatory Visit: Payer: Self-pay

## 2021-03-03 VITALS — BP 107/73 | HR 67 | Temp 98.6°F | Resp 16 | Ht 65.35 in | Wt 109.6 lb

## 2021-03-03 DIAGNOSIS — Z13228 Encounter for screening for other metabolic disorders: Secondary | ICD-10-CM

## 2021-03-03 DIAGNOSIS — Z0000 Encounter for general adult medical examination without abnormal findings: Secondary | ICD-10-CM

## 2021-03-03 DIAGNOSIS — Z1211 Encounter for screening for malignant neoplasm of colon: Secondary | ICD-10-CM

## 2021-03-03 DIAGNOSIS — Z131 Encounter for screening for diabetes mellitus: Secondary | ICD-10-CM

## 2021-03-03 DIAGNOSIS — Z130 Encounter for screening for diseases of the blood and blood-forming organs and certain disorders involving the immune mechanism: Secondary | ICD-10-CM

## 2021-03-03 DIAGNOSIS — Z13220 Encounter for screening for lipoid disorders: Secondary | ICD-10-CM

## 2021-03-03 DIAGNOSIS — Z1329 Encounter for screening for other suspected endocrine disorder: Secondary | ICD-10-CM

## 2021-03-03 NOTE — Progress Notes (Signed)
Annual physical exam  

## 2021-03-03 NOTE — Patient Instructions (Signed)

## 2021-03-04 ENCOUNTER — Other Ambulatory Visit: Payer: Self-pay | Admitting: Family

## 2021-03-04 DIAGNOSIS — R748 Abnormal levels of other serum enzymes: Secondary | ICD-10-CM

## 2021-03-04 DIAGNOSIS — Z130 Encounter for screening for diseases of the blood and blood-forming organs and certain disorders involving the immune mechanism: Secondary | ICD-10-CM

## 2021-03-04 LAB — COMPREHENSIVE METABOLIC PANEL
ALT: 49 IU/L — ABNORMAL HIGH (ref 0–44)
AST: 45 IU/L — ABNORMAL HIGH (ref 0–40)
Albumin/Globulin Ratio: 1.5 (ref 1.2–2.2)
Albumin: 4.4 g/dL (ref 3.8–4.8)
Alkaline Phosphatase: 77 IU/L (ref 44–121)
BUN/Creatinine Ratio: 11 (ref 10–24)
BUN: 10 mg/dL (ref 8–27)
Bilirubin Total: 0.3 mg/dL (ref 0.0–1.2)
CO2: 20 mmol/L (ref 20–29)
Calcium: 9.3 mg/dL (ref 8.6–10.2)
Chloride: 101 mmol/L (ref 96–106)
Creatinine, Ser: 0.88 mg/dL (ref 0.76–1.27)
Globulin, Total: 2.9 g/dL (ref 1.5–4.5)
Glucose: 65 mg/dL (ref 65–99)
Potassium: 4 mmol/L (ref 3.5–5.2)
Sodium: 137 mmol/L (ref 134–144)
Total Protein: 7.3 g/dL (ref 6.0–8.5)
eGFR: 98 mL/min/{1.73_m2} (ref >59)

## 2021-03-04 LAB — LIPID PANEL
Chol/HDL Ratio: 2.4 ratio (ref 0.0–5.0)
Cholesterol, Total: 153 mg/dL (ref 100–199)
HDL: 65 mg/dL (ref >39)
LDL Chol Calc (NIH): 73 mg/dL (ref 0–99)
Triglycerides: 76 mg/dL (ref 0–149)
VLDL Cholesterol Cal: 15 mg/dL (ref 5–40)

## 2021-03-04 LAB — CBC
Hematocrit: 37.5 % (ref 37.5–51.0)
Hemoglobin: 12.9 g/dL — ABNORMAL LOW (ref 13.0–17.7)
MCH: 33.4 pg — ABNORMAL HIGH (ref 26.6–33.0)
MCHC: 34.4 g/dL (ref 31.5–35.7)
MCV: 97 fL (ref 79–97)
Platelets: 175 10*3/uL (ref 150–450)
RBC: 3.86 x10E6/uL — ABNORMAL LOW (ref 4.14–5.80)
RDW: 12.6 % (ref 11.6–15.4)
WBC: 5.3 10*3/uL (ref 3.4–10.8)

## 2021-03-04 LAB — HEMOGLOBIN A1C
Est. average glucose Bld gHb Est-mCnc: 108 mg/dL
Hgb A1c MFr Bld: 5.4 % (ref 4.8–5.6)

## 2021-03-04 LAB — TSH: TSH: 0.926 u[IU]/mL (ref 0.450–4.500)

## 2021-03-04 NOTE — Progress Notes (Signed)
Kidney function normal.   Thyroid function normal.   Cholesterol normal.   No diabetes.   AST and ALT both elevated. These are enzymes used to check liver function.   Some causes of an elevation of AST/ALT may be increased alcohol consumption, viral hepatitis, high-fat diet, or overuse of NSAID's such as Ibuprofen, Aleve, Motrin, and Naproxen.   As a result of this increased risk a hepatitis panel will need to obtained from the lab to screen for possible viral hepatitis. Please come in soon to have this collected.  Patient encouraged to have repeat liver function tests in 4 to 6 weeks. Please schedule lab only visit to have this collected.   CBC appears to have some level of anemia. Iron panel added to confirm. Patient will need lab only visit to have collected.   Please note - patient does not have MyChart, may need to call with results.

## 2021-03-10 ENCOUNTER — Other Ambulatory Visit: Payer: Self-pay

## 2021-03-10 ENCOUNTER — Ambulatory Visit: Payer: Self-pay | Attending: Family

## 2022-05-11 ENCOUNTER — Encounter (HOSPITAL_COMMUNITY): Payer: Self-pay

## 2022-05-11 ENCOUNTER — Ambulatory Visit (HOSPITAL_COMMUNITY)
Admission: EM | Admit: 2022-05-11 | Discharge: 2022-05-11 | Disposition: A | Discharge status: Home / Self Care | Payer: Self-pay | Attending: Family Medicine | Admitting: Family Medicine

## 2022-05-11 DIAGNOSIS — G5603 Carpal tunnel syndrome, bilateral upper limbs: Secondary | ICD-10-CM

## 2022-05-11 MED ORDER — GABAPENTIN 300 MG PO CAPS: 300.0000 mg | ORAL_CAPSULE | Freq: Three times a day (TID) | ORAL | 0 refills | Status: DC

## 2022-05-11 NOTE — ED Provider Notes (Signed)
[place]   [id number] 05/11/22 Arrival Time: 0830  ASSESSMENT & PLAN:  1. Bilateral carpal tunnel syndrome   -Patient will be placed in bilateral wrist braces which were given to him in urgent care today.  He will start by wearing these at night.  I have also prescribed gabapentin 300 mg to take up to 3 times daily as needed.  If no improvement, he can follow-up with [facility] for ultrasound evaluation of his median nerve and possible ultrasound-guided corticosteroid hydrodissection injection.   Meds ordered this encounter  Medications   gabapentin (NEURONTIN) 300 MG capsule    Sig: Take 1 capsule (300 mg total) by mouth 3 (three) times daily.    Dispense:  60 capsule    Refill:  0     Discharge Instructions      You have carpal tunnel in your wrists Wear the braces at night and during the day if it helps Start taking the gabapentin medicine at night and slowly increase to 3x per day if helping       Follow-up Information     Hudnall, Sharyn Lull, MD.   Specialties: Sports Medicine, Family Medicine Why: If symptoms worsen Contact information: [address] [place] [address] [phone]                  Reviewed expectations re: course of current medical issues. Questions answered. Outlined signs and symptoms indicating need for more acute intervention. Patient verbalized understanding. After Visit Summary given.   SUBJECTIVE: Patient comes to urgent care to be evaluated for bilateral pain, tingling, numbness, and weakness in his hands.  This has all been present for about 1 month now.  He says he is no longer able to do things such as cook and open lids to containers due to the decreased sensation and strength in his hands.  He denies any trauma to the head, neck, hands.  He is taking ibuprofen as needed but that has not been very helpful.  He has not tried any braces.  Denies any numbness or tingling or weakness  anywhere else in his body.  No LMP for male patient. Past Surgical History:  Procedure Laterality Date   NO PAST SURGERIES       OBJECTIVE:  There were no vitals filed for this visit.   Physical Exam Vitals and nursing note reviewed.  Constitutional:      General: He is not in acute distress.    Appearance: He is well-developed.  HENT:     Head: Normocephalic and atraumatic.  Eyes:     Conjunctiva/sclera: Conjunctivae normal.  Cardiovascular:     Rate and Rhythm: Normal rate and regular rhythm.     Heart sounds: No murmur heard. Pulmonary:     Effort: Pulmonary effort is normal. No respiratory distress.     Breath sounds: Normal breath sounds.  Abdominal:     Palpations: Abdomen is soft.     Tenderness: There is no abdominal tenderness.  Musculoskeletal:     Cervical back: Neck supple.     Comments: Bilateral wrists -on inspection there is no obvious deformity, ecchymosis, or erythema at the wrist or hands.  He is nontender to palpation at his metacarpal bones.  He has full range of motion of the wrist.  He has diminished sensation to light touch in all fingers bilaterally.  His grip strength is 4/5 bilaterally. Negative Spurling test. He has a positive Tinel's test bilaterally.  He has a  positive Phalen's test  Skin:    General: Skin is warm and dry.     Capillary Refill: Capillary refill takes less than 2 seconds.  Neurological:     Mental Status: He is alert.  Psychiatric:        Mood and Affect: Mood normal.      Labs: Results for orders placed or performed in visit on 03/03/21  Comprehensive metabolic panel  Result Value Ref Range   Glucose 65 65 - 99 mg/dL   BUN 10 8 - 27 mg/dL   Creatinine, Ser 0.88 0.76 - 1.27 mg/dL   eGFR 98 >59 mL/min/1.73   BUN/Creatinine Ratio 11 10 - 24   Sodium 137 134 - 144 mmol/L   Potassium 4.0 3.5 - 5.2 mmol/L   Chloride 101 96 - 106 mmol/L   CO2 20 20 - 29 mmol/L   Calcium 9.3 8.6 - 10.2 mg/dL   Total Protein 7.3 6.0 - 8.5  g/dL   Albumin 4.4 3.8 - 4.8 g/dL   Globulin, Total 2.9 1.5 - 4.5 g/dL   Albumin/Globulin Ratio 1.5 1.2 - 2.2   Bilirubin Total 0.3 0.0 - 1.2 mg/dL   Alkaline Phosphatase 77 44 - 121 IU/L   AST 45 (H) 0 - 40 IU/L   ALT 49 (H) 0 - 44 IU/L  CBC  Result Value Ref Range   WBC 5.3 3.4 - 10.8 x10E3/uL   RBC 3.86 (L) 4.14 - 5.80 x10E6/uL   Hemoglobin 12.9 (L) 13.0 - 17.7 g/dL   Hematocrit 37.5 37.5 - 51.0 %   MCV 97 79 - 97 fL   MCH 33.4 (H) 26.6 - 33.0 pg   MCHC 34.4 31.5 - 35.7 g/dL   RDW 12.6 11.6 - 15.4 %   Platelets 175 150 - 450 x10E3/uL  Lipid panel  Result Value Ref Range   Cholesterol, Total 153 100 - 199 mg/dL   Triglycerides 76 0 - 149 mg/dL   HDL 65 >39 mg/dL   VLDL Cholesterol Cal 15 5 - 40 mg/dL   LDL Chol Calc (NIH) 73 0 - 99 mg/dL   Chol/HDL Ratio 2.4 0.0 - 5.0 ratio  TSH  Result Value Ref Range   TSH 0.926 0.450 - 4.500 uIU/mL  Hemoglobin A1c  Result Value Ref Range   Hgb A1c MFr Bld 5.4 4.8 - 5.6 %   Est. average glucose Bld gHb Est-mCnc 108 mg/dL   Labs Reviewed - No data to display  Imaging: No results found.   No Known Allergies                                             Past Medical History:  Diagnosis Date   Elevated LFTs    Pancreatitis     Social History   Socioeconomic History   Marital status: Divorced    Spouse name: Not on file   Number of children: Not on file   Years of education: Not on file   Highest education level: Not on file  Occupational History   Not on file  Tobacco Use   Smoking status: Every Day    Packs/day: 1.00    Years: 45.00    Total pack years: 45.00    Types: Cigarettes   Smokeless tobacco: Never  Vaping Use   Vaping Use: Never used  Substance and Sexual Activity   Alcohol use:  Yes    Alcohol/week: 6.0 standard drinks of alcohol    Types: 6 Cans of beer per week    Comment: DAILY   Drug use: Not Currently   Sexual activity: Not on file  Other Topics Concern   Not on file  Social History Narrative    Not on file   Social Determinants of Health   Financial Resource Strain: Not on file  Food Insecurity: Not on file  Transportation Needs: Not on file  Physical Activity: Not on file  Stress: Not on file  Social Connections: Not on file  Intimate Partner Violence: Not on file    Family History  Problem Relation Age of Onset   Breast cancer Mother    Pancreatitis Neg Hx    Liver disease Neg Hx    Colon cancer Neg Hx    Stomach cancer Neg Hx    Esophageal cancer Neg Hx       Abbigaile Rockman, Dorian Pod, MD 05/11/22 1000

## 2022-05-11 NOTE — Discharge Instructions (Signed)
You have carpal tunnel in your wrists Wear the braces at night and during the day if it helps Start taking the gabapentin medicine at night and slowly increase to 3x per day if helping

## 2022-05-11 NOTE — ED Triage Notes (Signed)
Pt c/o numbness to both hands and fingers for over a month. States can't grip anything. No facial dropping or drift noted.

## 2022-06-07 ENCOUNTER — Encounter (HOSPITAL_COMMUNITY): Payer: Self-pay

## 2022-06-07 ENCOUNTER — Ambulatory Visit (HOSPITAL_COMMUNITY)
Admission: RE | Admit: 2022-06-07 | Discharge: 2022-06-07 | Disposition: A | Discharge status: Home / Self Care | Payer: Self-pay | Source: Ambulatory Visit | Attending: Physician Assistant | Admitting: Physician Assistant

## 2022-06-07 VITALS — BP 144/83 | HR 75 | Temp 98.2°F | Resp 18

## 2022-06-07 DIAGNOSIS — G5603 Carpal tunnel syndrome, bilateral upper limbs: Secondary | ICD-10-CM

## 2022-06-07 MED ORDER — NAPROXEN 375 MG PO TABS: 375.0000 mg | ORAL_TABLET | Freq: Two times a day (BID) | ORAL | 0 refills | Status: DC

## 2022-06-07 MED ORDER — PREDNISONE 10 MG PO TABS: 10.0000 mg | ORAL_TABLET | Freq: Three times a day (TID) | ORAL | 0 refills | Status: DC

## 2022-06-07 NOTE — ED Triage Notes (Signed)
Pt states that he has been wearing braces on both wrist and he has finishes the Gabapentin his hand are still numb. He would like to know if there is a injection for this or if he can get a refill on the med.

## 2022-06-07 NOTE — ED Provider Notes (Signed)
[facility]    CSN: [id number] Arrival date & time: 06/07/22  1432      History   Chief Complaint Chief Complaint  Patient presents with   Hand Problem    HPI Samuel [name] is a 61 y.o. male.   61 year old male presents with bilateral hand numbness and tingling.  Patient indicates that he has been wearing the wrist braces for the past month but he continues to have numbness and tingling of both hands.  Patient indicates he cannot feel and is having difficulty picking up objects and grasping a hold of different items.  Patient indicates he has been taking the Neurontin but it has not given him any relief.  He indicates that he has not had any improvement in his symptoms over the past month and he has also been taking Tylenol and ibuprofen but these have not helped either.  He indicates he does not have pain of the wrist or the hands just the numbness and tingling, which occurs in all digits.     Past Medical History:  Diagnosis Date   Elevated LFTs    Pancreatitis     Patient Active Problem List   Diagnosis Date Noted   Pancreatic stones    Alcohol-induced chronic pancreatitis (HCC)    Abnormal LFTs    Pancreatic duct dilated 11/18/2018   Transaminitis 09/11/2018   HYPOKALEMIA 07/28/2010   TOBACCO ABUSE 07/28/2010   COCAINE ABUSE 07/28/2010   NEUROPATHY 07/28/2010   HYPONATREMIA 07/12/2010   Alcohol abuse 07/12/2010   Acute pancreatitis 07/12/2010    Past Surgical History:  Procedure Laterality Date   NO PAST SURGERIES         Home Medications    Prior to Admission medications   Medication Sig Start Date End Date Taking? Authorizing Provider  aspirin 325 MG tablet Take 325 mg by mouth every 6 (six) hours as needed for mild pain or headache.    Yes [provider]  gabapentin (NEURONTIN) 300 MG capsule Take 1 capsule (300 mg total) by mouth 3 (three) times daily. 05/11/22  Yes Rafoth, Baldemar Friday, MD  Multiple Vitamin (MULTIVITAMIN WITH  MINERALS) TABS tablet Take 1 tablet by mouth daily. 11/22/18  Yes Claudean Severance, MD  naproxen (NAPROSYN) 375 MG tablet Take 1 tablet (375 mg total) by mouth 2 (two) times daily. 06/07/22  Yes Ellsworth Lennox, PA-C  predniSONE (DELTASONE) 10 MG tablet Take 1 tablet (10 mg total) by mouth 3 (three) times daily. 06/07/22  Yes Ellsworth Lennox, PA-C  sildenafil (VIAGRA) 50 MG tablet Take 1 tablet (50 mg total) by mouth daily as needed for erectile dysfunction. 04/18/19  Yes Bing Neighbors, FNP    Family History Family History  Problem Relation Age of Onset   Breast cancer Mother    Pancreatitis Neg Hx    Liver disease Neg Hx    Colon cancer Neg Hx    Stomach cancer Neg Hx    Esophageal cancer Neg Hx     Social History Social History   Tobacco Use   Smoking status: Every Day    Packs/day: 1.00    Years: 45.00    Total pack years: 45.00    Types: Cigarettes   Smokeless tobacco: Never  Vaping Use   Vaping Use: Never used  Substance Use Topics   Alcohol use: Yes    Alcohol/week: 6.0 standard drinks of alcohol    Types: 6 Cans of beer per week    Comment: DAILY  Drug use: Not Currently     Allergies   Patient has no known allergies.   Review of Systems Review of Systems  Musculoskeletal:  Positive for joint swelling (both hands numb).     Physical Exam Triage Vital Signs ED Triage Vitals  Enc Vitals Group     BP 06/07/22 1451 (!) 144/83     Pulse Rate 06/07/22 1451 75     Resp 06/07/22 1451 18     Temp 06/07/22 1451 98.2 F (36.8 C)     Temp Source 06/07/22 1451 Oral     SpO2 06/07/22 1451 98 %     Weight --      Height --      Head Circumference --      Peak Flow --      Pain Score 06/07/22 1450 0     Pain Loc --      Pain Edu? --      Excl. in GC? --    No data found.  Updated Vital Signs BP (!) 144/83 (BP Location: Left Arm)   Pulse 75   Temp 98.2 F (36.8 C) (Oral)   Resp 18   SpO2 98%   Visual Acuity Right Eye Distance:   Left Eye Distance:    Bilateral Distance:    Right Eye Near:   Left Eye Near:    Bilateral Near:     Physical Exam Constitutional:      Appearance: Normal appearance.  Musculoskeletal:     Comments: Hands: Strength is 5/5 on the right as compared to 4/5 on the left.  F ROM is normal.  There is no pain on palpation of the anterior wrist bilaterally.  Stability is normal bilaterally.  There is no evidence of any swelling or redness of either wrist areas.  Neurological:     Mental Status: He is alert.      UC Treatments / Results  Labs (all labs ordered are listed, but only abnormal results are displayed) Labs Reviewed - No data to display  EKG   Radiology No results found.  Procedures Procedures (including critical care time)  Medications Ordered in UC Medications - No data to display  Initial Impression / Assessment and Plan / UC Course  I have reviewed the triage vital signs and the nursing notes.  Pertinent labs & imaging results that were available during my care of the patient were reviewed by me and considered in my medical decision making (see chart for details).    Plan: 1.  Advised to continue wearing the wrist brace bilaterally. 2.  Advised take Naprosyn 375 mg twice daily with food to help decrease the inflammation. 3.  Advised take prednisone 10 mg 3 times a day in order to decrease the inflammation. 4.  Patient has been internally referred to [facility] orthopedics for evaluation of his carpal tunnel syndrome. Final Clinical Impressions(s) / UC Diagnoses   Final diagnoses:  Bilateral carpal tunnel syndrome     Discharge Instructions      Advised to continue to wear the bracing on both wrist. Advised to take the Naprosyn 1 every 12 hours with food to help reduce some of the inflammatory process Advised take prednisone 10 mg 3 times a day for 5 days to help reduce the inflammatory process. Internal referral has been made to [facility] orthopedics, they should be calling  you within the next 48 hours to arrange an appointment to have the condition evaluated.    ED Prescriptions  Medication Sig Dispense Auth. Provider   naproxen (NAPROSYN) 375 MG tablet Take 1 tablet (375 mg total) by mouth 2 (two) times daily. 20 tablet Ellsworth Lennox, PA-C   predniSONE (DELTASONE) 10 MG tablet Take 1 tablet (10 mg total) by mouth 3 (three) times daily. 15 tablet Ellsworth Lennox, PA-C      PDMP not reviewed this encounter.   Ellsworth Lennox, PA-C 06/07/22 [phone]

## 2022-06-07 NOTE — Discharge Instructions (Signed)
Advised to continue to wear the bracing on both wrist. Advised to take the Naprosyn 1 every 12 hours with food to help reduce some of the inflammatory process Advised take prednisone 10 mg 3 times a day for 5 days to help reduce the inflammatory process. Internal referral has been made to [facility] orthopedics, they should be calling you within the next 48 hours to arrange an appointment to have the condition evaluated.

## 2022-07-20 ENCOUNTER — Encounter (HOSPITAL_COMMUNITY): Payer: Self-pay | Admitting: Emergency Medicine

## 2022-07-20 ENCOUNTER — Ambulatory Visit (HOSPITAL_COMMUNITY)
Admission: EM | Admit: 2022-07-20 | Discharge: 2022-07-20 | Disposition: A | Discharge status: Home / Self Care | Payer: Self-pay | Attending: Family Medicine | Admitting: Family Medicine

## 2022-07-20 DIAGNOSIS — G5603 Carpal tunnel syndrome, bilateral upper limbs: Secondary | ICD-10-CM

## 2022-07-20 MED ORDER — PREDNISONE 20 MG PO TABS: 40.0000 mg | ORAL_TABLET | Freq: Every day | ORAL | 0 refills | Status: DC

## 2022-07-20 NOTE — ED Provider Notes (Signed)
[name]   [id number] 07/20/22 Arrival Time: 1448  ASSESSMENT & PLAN:  1. Bilateral carpal tunnel syndrome    No indication for plain imaging. Trial of: Meds ordered this encounter  Medications   predniSONE (DELTASONE) 20 MG tablet    Sig: Take 2 tablets (40 mg total) by mouth daily.    Dispense:  14 tablet    Refill:  0   Will wear wrist splints as much as possible for the next 1-2 weeks. Recommend:  Follow-up Information     Schedule an appointment as soon as possible for a visit  with [place].   Contact information: [address] [place] [phone]                Reviewed expectations re: course of current medical issues. Questions answered. Outlined signs and symptoms indicating need for more acute intervention. Patient verbalized understanding. After Visit Summary given.  SUBJECTIVE: History from: patient. Samuel [name] is a 61 y.o. male who reports continuing numbness/tingling in bilat UE. For months. Off/on. No specific aggravating or alleviating factors reported. Gabapentin not helping.  Past Surgical History:  Procedure Laterality Date   NO PAST SURGERIES        OBJECTIVE:  Vitals:   07/20/22 1437  BP: 126/70  Pulse: 62  Resp: 17  Temp: 97.9 F (36.6 C)  TempSrc: Oral  SpO2: 100%    General appearance: alert; no distress HEENT: [place]; AT Neck: supple with FROM Resp: unlabored respirations Extremities: Bilateral UE: warm with well perfused appearance; no specific TTP over forearm/wrist.DROM of the wrists. Slightly diminished sensation to light touch in all fingers bilaterally; positive Tinel's test bilaterally CV: brisk extremity capillary refill of bilateral UE Skin: warm and dry; no visible rashes Neurologic: gait normal; normal sensation and strength of bilateral UE Psychological: alert and cooperative; normal mood and affect   No Known  Allergies  Past Medical History:  Diagnosis Date   Elevated LFTs    Pancreatitis    Social History   Socioeconomic History   Marital status: Divorced    Spouse name: Not on file   Number of children: Not on file   Years of education: Not on file   Highest education level: Not on file  Occupational History   Not on file  Tobacco Use   Smoking status: Every Day    Packs/day: 1.00    Years: 45.00    Total pack years: 45.00    Types: Cigarettes   Smokeless tobacco: Never  Vaping Use   Vaping Use: Never used  Substance and Sexual Activity   Alcohol use: Yes    Alcohol/week: 6.0 standard drinks of alcohol    Types: 6 Cans of beer per week    Comment: DAILY   Drug use: Not Currently   Sexual activity: Not on file  Other Topics Concern   Not on file  Social History Narrative   Not on file   Social Determinants of Health   Financial Resource Strain: Not on file  Food Insecurity: Not on file  Transportation Needs: Not on file  Physical Activity: Not on file  Stress: Not on file  Social Connections: Not on file   Family History  Problem Relation Age of Onset   Breast cancer Mother    Pancreatitis Neg Hx    Liver disease Neg Hx    Colon cancer Neg Hx    Stomach cancer Neg Hx  Esophageal cancer Neg Hx    Past Surgical History:  Procedure Laterality Date   NO PAST SURGERIES         Mardella Layman, MD 07/20/22 1525

## 2022-07-20 NOTE — ED Triage Notes (Signed)
Pt reports bilateral hand numbness x 2 months. States he has been seen here multiple times for same complaint. Reports Gabapentin is not effective.

## 2022-08-01 ENCOUNTER — Encounter: Payer: Self-pay | Admitting: Family Medicine

## 2022-08-01 ENCOUNTER — Ambulatory Visit: Payer: Self-pay

## 2022-08-01 ENCOUNTER — Ambulatory Visit (INDEPENDENT_AMBULATORY_CARE_PROVIDER_SITE_OTHER): Payer: Self-pay | Admitting: Family Medicine

## 2022-08-01 VITALS — BP 129/76 | Ht 65.0 in | Wt 120.0 lb

## 2022-08-01 DIAGNOSIS — M79641 Pain in right hand: Secondary | ICD-10-CM

## 2022-08-01 DIAGNOSIS — M79642 Pain in left hand: Secondary | ICD-10-CM

## 2022-08-01 MED ORDER — MELOXICAM 15 MG PO TABS: 15.0000 mg | ORAL_TABLET | Freq: Every day | ORAL | 2 refills | Status: DC

## 2022-08-01 NOTE — Patient Instructions (Addendum)
Go through the cone coverage process - this is very important - and call us when this goes through. Your ultrasound suggests this is NOT carpal tunnel syndrome. Take meloxicam 15mg  daily with food for pain and inflammation - don't take aleve or ibuprofen while on this. Ok to wear wrist braces at bedtime though it's unlikely to help. We will consider an MRI of your neck, nerve testing as next steps.

## 2022-08-01 NOTE — Progress Notes (Signed)
    SUBJECTIVE:   CHIEF COMPLAINT / HPI:   Patient is a 61 year old male who presents today due to concerns of bilateral wrist pain. Pain started about a year ago. Describes pain as constant tingling and numbness in his wrist down to all of his fingers. This has made it difficult with daily activities such as cutting fruits and vegetables or buttoning his pants. He has been seen by 3 physicians for same problem and has tried gabapentin and prednisone with no improvement. He was started on bilateral wrist braces which he wears to sleep and usually wear 15-20 hours a day and symptoms haven't improved. Denies any recent trauma to the shoulder, spine or hands but reports a fall 2 years ago in which he hit his head.  CT head at that time was negative. He is currently retired but worked in [employer] which involve mostly using his hands.  Neck pain Patient reports neck pain which started about 4 months ago Describes pain as non radiating sharp pain mainly localized on the left aspect of the neck. He denies any recent trauma to the neck but reports a fall 2 years ago. Cervical spine CT was negative. He has tried ice, heat and Aspirin which usually provide temporary relief. No bowel/bladder dysfunction.  PERTINENT  PMH / PSH: Reviewed  OBJECTIVE:   BP 129/76   Ht 5\' 5"  (1.651 m)   Wt 120 lb (54.4 kg)   BMI 19.97 kg/m    Physical Exam  General: Alert, well appearing, NAD  Neck Exam No gross deformity, scoliosis. TTP over the left trapezius Limited lateral rotation of neck to the left Strength 5/5 in the UEs bilaterally Sensation intact to light touch bilaterally. Negative spurlings.  Wrist Exam No erythema, or edema of the left wrist.  TTP on palmar surface at the radial aspect of the left wrist FROM with strength. Normal adduction and abduction of interossesi muscles. Negative finkelstein, tinels, phalens.  Limited MSK u/s bilateral wrists:  Median nerve volume  0.08cm2 bilaterally.  Left bifid median nerve  ASSESSMENT/PLAN:  Bilateral wrist pain - Patient describes pain, numbness from bilateral wrists down into all digits of hands.  Also reports going up his arms, neck pain.  Median nerve volume is normal bilaterally.  Along with lack of response to conservative treatment for carpal tunnel, suggests cause of his pain is a different diagnosis than carpal tunnel syndrome; possibly cervical spine pathology or other peripheral neuropathy.  He is going to apply for cone coverage.  Consider MRI cervical spine when this goes through, NCV/EMG consideration if a provider accepts [facility] coverage.  Meloxicam in meantime.  Alen Bleacher, MD [place]

## 2022-10-27 ENCOUNTER — Other Ambulatory Visit: Payer: Self-pay

## 2022-10-27 DIAGNOSIS — M79641 Pain in right hand: Secondary | ICD-10-CM

## 2022-11-08 ENCOUNTER — Ambulatory Visit
Admission: RE | Admit: 2022-11-08 | Discharge: 2022-11-08 | Disposition: A | Discharge status: Home / Self Care | Payer: No Typology Code available for payment source | Source: Ambulatory Visit | Attending: Family Medicine | Admitting: Family Medicine

## 2022-11-08 DIAGNOSIS — M79641 Pain in right hand: Secondary | ICD-10-CM

## 2022-11-11 ENCOUNTER — Other Ambulatory Visit: Payer: Self-pay

## 2022-11-11 DIAGNOSIS — M79642 Pain in left hand: Secondary | ICD-10-CM

## 2022-11-14 ENCOUNTER — Ambulatory Visit: Payer: No Typology Code available for payment source | Admitting: Family Medicine

## 2022-11-30 ENCOUNTER — Ambulatory Visit (INDEPENDENT_AMBULATORY_CARE_PROVIDER_SITE_OTHER): Payer: Medicaid Other | Admitting: Orthopedic Surgery

## 2022-11-30 ENCOUNTER — Encounter: Payer: Self-pay | Admitting: Orthopedic Surgery

## 2022-11-30 ENCOUNTER — Ambulatory Visit (INDEPENDENT_AMBULATORY_CARE_PROVIDER_SITE_OTHER): Payer: Medicaid Other

## 2022-11-30 VITALS — BP 134/81 | HR 61 | Ht 65.0 in | Wt 120.0 lb

## 2022-11-30 DIAGNOSIS — M4712 Other spondylosis with myelopathy, cervical region: Secondary | ICD-10-CM | POA: Diagnosis not present

## 2022-11-30 DIAGNOSIS — M542 Cervicalgia: Secondary | ICD-10-CM | POA: Diagnosis not present

## 2022-11-30 NOTE — Progress Notes (Signed)
Pre-operative Scores  NDI: 46 mJOA: 8 SF-36:   -Physical functioning: 10  -Role limitations due to physical health: 0  -Role limitations due to emotional problems: 0  -Energy/fatigue: 40  -Emotional well-being: 96  -Social functioning: 25  -Pain: 32.5  -General health: [place], MD Orthopedic Surgeon

## 2022-11-30 NOTE — Progress Notes (Addendum)
Orthopedic Spine Surgery Office Note  Assessment: Patient is a 62 y.o. male with symptoms of progressive myelopathy.  His MRI shows C3/4 stenosis with T2 cord signal change.   Plan: -Discussed the limited role of conservative treatment in cases of myelopathy so recommended operative treatment at this time -Patient will be next seen at the date of surgery  The patient has signs and symptoms consistent with cervical myelopathy. The most common natural history of cervical myelopathy was explained to the patient. Given this course, operative management in the form of C3/4 anterior cervical discectomy and fusion was recommended to the patient. The risks, including but not limited to pseudarthrosis, dysphagia, hematoma, airway compromise, recurrent laryngeal nerve injury, esophageal perforation, durotomy, spinal cord injury, nerve root injury, persistent pain, adjacent segment disease, infection, bleeding, hardware failure, vascular injury, heart attack, death, stroke, fracture, and need for additional procedures were discussed with the patient.  I discussed that since he is a current smoker and his risk of infection and pseudoarthrosis would be higher.  However, given the nature of myelopathy, I told him I would still recommend proceeding with surgery and except the fact that the risk of surgery would be higher.  The benefit of surgery would be preventing progression of the myelopathy and not to reverse any myelopathic symptoms. The alternatives to surgical management would be continued monitoring, physical therapy, over-the-counter pain medications, ambulatory aids, and activity modification. I reiterated that these modalities would not change the natural history of the disease and that is why operative management has been recommended. All the patient's questions were answered to his satisfaction. After this discussion, the patient expressed understanding and elected to proceed with surgical intervention.       ___________________________________________________________________________   History:  Patient is a 62 y.o. male who presents today for several symptoms.  He states that his symptoms started a couple of years ago.  He estimates that it started about 3 years ago.  Since his symptoms have started, as he has noticed progressively worsening hand dexterity issues.  He states that he feels clumsy with his hands and has had difficulty using his hands which has gotten worse with time.  It is gotten to the point that he can feed himself but it is with great difficulty.  He is also noticing numbness in his upper extremities that has also gotten progressively worse.  It is gotten to the point that he says that he can barely feel anything in his hands.  He has reduced sensation in the proximal forearms and arms bilaterally.  He also has decreased sensation throughout his legs bilaterally.  Over time, he has noticed worsening unsteadiness with gait.  He has had falls as a result of the unsteadiness.  He is now using a walker because he cannot remain upright without assistive devices.  He does not have any radiating pain into his arms or legs.   Weakness: Yes, feels his hands and arms are weaker.  Also feels that his legs are weaker and are unsteady beneath him Difficulty with fine motor skills (e.g., buttoning shirts, handwriting): Yes, has had great difficulty using his hands of the leg.  Cannot do any fine motor skills but is still able to feed himself with difficulty Symptoms of imbalance: Yes, is very unsteady on his feet and has had to start using a walker Paresthesias and numbness: Yes, his arms have decreased sensation bilaterally in all distributions and he has increasing numbness distally in the upper extremities.  He feels that  sensation is decreased in his bilateral legs in all distributions but is not as significant as in his distal upper extremities Bowel or bladder incontinence: Denies Saddle  anesthesia: Denies  Treatments tried: Aspirin, ibuprofen, walker  Review of systems: Denies fevers and chills, night sweats, unexplained weight loss, history of cancer, pain that wakes them at night  Past medical history: Pancreatitis Chart history of alcohol abuse Chart history of cocaine abuse  Allergies: NKDA  Past surgical history:  None  Social history: Reports use of nicotine product (smoking, vaping, patches, smokeless) - 0.5ppd Alcohol use: Yes, 6 drinks per week Denies recreational drug use Currently homeless   Physical Exam:  BMI 19.9  General: no acute distress, appears stated age Neurologic: alert, answering questions appropriately, following commands Respiratory: unlabored breathing on room air, symmetric chest rise Psychiatric: appropriate affect, normal cadence to speech   MSK (spine):  -Strength exam      Left  Right Grip strength               4/5  4/5 Interosseus   4/5   4/5 Wrist extension  4/5  4/5 Wrist flexion   4/5  4/5 Elbow flexion   4/5  4/5 Deltoid    4/5  4/5  EHL    4/5  4/5 TA    4/5  4/5 GSC    4/5  4/5 Knee extension  4/5  4/5 Hip flexion   4/5  4/5  -Sensory exam    Sensation intact to light touch in L3-S1 nerve distributions of bilateral lower extremities  Sensation intact to light touch in C5-T1 nerve distributions of bilateral upper extremities  -Brachioradialis DTR: 3/4 on the left, 3/4 on the right -Biceps DTR: 3/4 on the left, 3/4 on the right -Triceps DTR: 3/4 on the left, 3/4 on the right -Achilles DTR: 3/4 on the left, 3/4 on the right -Patellar tendon DTR: 3/4 on the left, 3/4 on the right  -Spurling: Negative bilaterally -Hoffman sign: Positive bilaterally -Interosseous atrophy: Seen bilaterally -Grip and release test: Positive -Clonus: No beats bilaterally -Romberg: Positive -Gait: Wide-based slow and unsteady -Imbalance with tandem gait: Yes, unable to perform.  Tinel's at wrist: Negative  bilaterally Phalen's at wrist: Negative bilaterally Durkan's: Negative bilaterally  Tinel's at elbow: Negative bilaterally  Imaging: XR of the cervical spine from 11/30/2022 was independently reviewed and interpreted, showing anterior osteophyte at multiple levels.  Disc height loss at C5/6, C6/7.  Lordotic alignment.  No fracture or dislocation.  No evidence of instability on flexion/extension views.  MRI of the cervical spine from 11/08/2022 was independently reviewed and interpreted, showing central and bilateral foraminal stenosis at C3/4 with T2 cord signal change around that disc space.  Foraminal stenosis at C4/5 (left greater than right).  Foraminal stenosis on the left at C7/T1.   Patient name: Samuel [name] Patient MRN: [id number] Date of visit: 11/30/22

## 2022-12-09 ENCOUNTER — Ambulatory Visit: Payer: No Typology Code available for payment source | Admitting: Orthopedic Surgery

## 2022-12-13 NOTE — Progress Notes (Signed)
Surgical Instructions    Your procedure is scheduled on Friday March 8th.  Report to [facility] Main Entrance "A" at 10:15 A.M., then check in with the Admitting office.  Call this number if you have problems the morning of surgery:  [phone]   If you have any questions prior to your surgery date call [phone]: Open Monday-Friday 8am-4pm If you experience any cold or flu symptoms such as cough, fever, chills, shortness of breath, etc. between now and your scheduled surgery, please notify us at the above number     Remember:  Do not eat after midnight the night before your surgery  You may drink clear liquids until 9:15am the morning of your surgery.   Clear liquids allowed are: Water, Non-Citrus Juices (without pulp), Carbonated Beverages, Clear Tea, Black Coffee ONLY (NO MILK, CREAM OR POWDERED CREAMER of any kind), and Gatorade   Enhanced Recovery after Surgery for Orthopedics Enhanced Recovery after Surgery is a protocol used to improve the stress on your body and your recovery after surgery.  Patient Instructions  The day of surgery (if you do NOT have diabetes):  Drink ONE (1) Pre-Surgery Clear Ensure by __9:15___ am the morning of surgery   This drink was given to you during your hospital  pre-op appointment visit. Nothing else to drink after completing the  Pre-Surgery Clear Ensure.           If you have questions, please contact your surgeon's office.     Take these medicines the morning of surgery with A SIP OF WATER: IF NEEDED  acetaminophen (TYLENOL) 500 MG tablet     Follow your surgeon's instructions on when to stop Aspirin.  If no instructions were given by your surgeon then you will need to call the office to get those instructions.     As of today, STOP taking any Aspirin (unless otherwise instructed by your surgeon) Aleve, Naproxen, Ibuprofen, Motrin, Advil, Goody's, BC's, all herbal medications, fish oil, and all vitamins.           Do not  wear jewelry  Do not wear lotions, powders, cologne or deodorant. Do not shave 48 hours prior to surgery.  Men may shave face and neck. Do not bring valuables to the hospital. Do not wear nail polish  [facility] is not responsible for any belongings or valuables.    Do NOT Smoke (Tobacco/Vaping)  24 hours prior to your procedure  If you use a CPAP at night, you may bring your mask for your overnight stay.   Contacts, glasses, hearing aids, dentures or partials may not be worn into surgery, please bring cases for these belongings   For patients admitted to the hospital, discharge time will be determined by your treatment team.   Patients discharged the day of surgery will not be allowed to drive home, and someone needs to stay with them for 24 hours.   SURGICAL WAITING ROOM VISITATION Patients having surgery or a procedure may have no more than 2 support people in the waiting area - these visitors may rotate.   Children under the age of 43 must have an adult with them who is not the patient. If the patient needs to stay at the hospital during part of their recovery, the visitor guidelines for inpatient rooms apply. Pre-op nurse will coordinate an appropriate time for 1 support person to accompany patient in pre-op.  This support person may not rotate.   Please refer to [email or web address] for the visitor guidelines for  Inpatients (after your surgery is over and you are in a regular room).    Special instructions:    Oral Hygiene is also important to reduce your risk of infection.  Remember - BRUSH YOUR TEETH THE MORNING OF SURGERY WITH YOUR REGULAR TOOTHPASTE   [place]- Preparing For Surgery  Before surgery, you can play an important role. Because skin is not sterile, your skin needs to be as free of germs as possible. You can reduce the number of germs on your skin by washing with CHG (chlorahexidine gluconate) Soap before  surgery.  CHG is an antiseptic cleaner which kills germs and bonds with the skin to continue killing germs even after washing.     Please do not use if you have an allergy to CHG or antibacterial soaps. If your skin becomes reddened/irritated stop using the CHG.  Do not shave (including legs and underarms) for at least 48 hours prior to first CHG shower. It is OK to shave your face.  Please follow these instructions carefully.     Shower the NIGHT BEFORE SURGERY and the MORNING OF SURGERY with CHG Soap.   If you chose to wash your hair, wash your hair first as usual with your normal shampoo. After you shampoo, rinse your hair and body thoroughly to remove the shampoo.  Then [employer] and genitals (private parts) with your normal soap and rinse thoroughly to remove soap.  After that Use CHG Soap as you would any other liquid soap. You can apply CHG directly to the skin and wash gently with a scrungie or a clean washcloth.   Apply the CHG Soap to your body ONLY FROM THE NECK DOWN.  Do not use on open wounds or open sores. Avoid contact with your eyes, ears, mouth and genitals (private parts). Wash Face and genitals (private parts)  with your normal soap.   Wash thoroughly, paying special attention to the area where your surgery will be performed.  Thoroughly rinse your body with warm water from the neck down.  DO NOT shower/wash with your normal soap after using and rinsing off the CHG Soap.  Pat yourself dry with a CLEAN TOWEL.  Wear CLEAN PAJAMAS to bed the night before surgery  Place CLEAN SHEETS on your bed the night before your surgery  DO NOT SLEEP WITH PETS.   Day of Surgery:  Take a shower with CHG soap. Wear Clean/Comfortable clothing the morning of surgery Do not apply any deodorants/lotions.   Remember to brush your teeth WITH YOUR REGULAR TOOTHPASTE.    If you received a COVID test during your pre-op visit, it is requested that you wear a mask when out in public,  stay away from anyone that may not be feeling well, and notify your surgeon if you develop symptoms. If you have been in contact with anyone that has tested positive in the last 10 days, please notify your surgeon.    Please read over the following fact sheets that you were given.

## 2022-12-14 ENCOUNTER — Other Ambulatory Visit: Payer: Self-pay

## 2022-12-14 ENCOUNTER — Encounter (HOSPITAL_COMMUNITY)
Admission: RE | Admit: 2022-12-14 | Discharge: 2022-12-14 | Disposition: A | Discharge status: Home / Self Care | Payer: Medicaid Other | Source: Ambulatory Visit | Attending: Orthopedic Surgery | Admitting: Orthopedic Surgery

## 2022-12-14 ENCOUNTER — Encounter (HOSPITAL_COMMUNITY): Payer: Self-pay

## 2022-12-14 VITALS — BP 119/81 | HR 73 | Temp 97.7°F | Resp 16 | Ht 65.0 in | Wt 97.1 lb

## 2022-12-14 DIAGNOSIS — K860 Alcohol-induced chronic pancreatitis: Secondary | ICD-10-CM | POA: Diagnosis not present

## 2022-12-14 DIAGNOSIS — Z01818 Encounter for other preprocedural examination: Secondary | ICD-10-CM

## 2022-12-14 DIAGNOSIS — F1010 Alcohol abuse, uncomplicated: Secondary | ICD-10-CM | POA: Diagnosis not present

## 2022-12-14 DIAGNOSIS — M4712 Other spondylosis with myelopathy, cervical region: Secondary | ICD-10-CM

## 2022-12-14 HISTORY — DX: Disease of spinal cord, unspecified: G95.9

## 2022-12-14 LAB — COMPREHENSIVE METABOLIC PANEL
ALT: 19 U/L (ref 0–44)
AST: 34 U/L (ref 15–41)
Albumin: 3.7 g/dL (ref 3.5–5.0)
Alkaline Phosphatase: 77 U/L (ref 38–126)
Anion gap: 11 (ref 5–15)
BUN: 13 mg/dL (ref 8–23)
CO2: 22 mmol/L (ref 22–32)
Calcium: 9.3 mg/dL (ref 8.9–10.3)
Chloride: 101 mmol/L (ref 98–111)
Creatinine, Ser: 0.6 mg/dL — ABNORMAL LOW (ref 0.61–1.24)
GFR, Estimated: >60 mL/min (ref >60)
Glucose, Bld: 112 mg/dL — ABNORMAL HIGH (ref 70–99)
Potassium: 3.5 mmol/L (ref 3.5–5.1)
Sodium: 134 mmol/L — ABNORMAL LOW (ref 135–145)
Total Bilirubin: 0.8 mg/dL (ref 0.3–1.2)
Total Protein: 7.5 g/dL (ref 6.5–8.1)

## 2022-12-14 LAB — CBC
HCT: 40.6 % (ref 39.0–52.0)
Hemoglobin: 14.2 g/dL (ref 13.0–17.0)
MCH: 32.3 pg (ref 26.0–34.0)
MCHC: 35 g/dL (ref 30.0–36.0)
MCV: 92.5 fL (ref 80.0–100.0)
Platelets: 253 10*3/uL (ref 150–400)
RBC: 4.39 MIL/uL (ref 4.22–5.81)
RDW: 15.8 % — ABNORMAL HIGH (ref 11.5–15.5)
WBC: 5 10*3/uL (ref 4.0–10.5)
nRBC: 0 % (ref 0.0–0.2)

## 2022-12-14 LAB — SURGICAL PCR SCREEN
MRSA, PCR: NEGATIVE
Staphylococcus aureus: NEGATIVE

## 2022-12-14 NOTE — Progress Notes (Signed)
PCP - Durene Fruits, NP Cardiologist - denies  PPM/ICD - denies   Chest x-ray - 09/11/18 EKG - 12/14/22 Stress Test - denies ECHO - denies Cardiac Cath - denies  Sleep Study - denies   DM- denies  Blood Thinner Instructions: n/a Aspirin Instructions: f/u with surgeon  ERAS Protcol - yes PRE-SURGERY Ensure given at PAT  COVID TEST- n/a   Anesthesia review: no  Patient denies shortness of breath, fever, cough and chest pain at PAT appointment   All instructions explained to the patient, with a verbal understanding of the material. Patient agrees to go over the instructions while at home for a better understanding.  The opportunity to ask questions was provided.

## 2022-12-16 ENCOUNTER — Encounter (HOSPITAL_COMMUNITY): Payer: Self-pay | Admitting: Orthopedic Surgery

## 2022-12-16 ENCOUNTER — Observation Stay (HOSPITAL_COMMUNITY): Payer: Medicaid Other

## 2022-12-16 ENCOUNTER — Encounter (HOSPITAL_COMMUNITY)
Admission: RE | Disposition: A | Discharge status: Rehabilitation Facility | Payer: Self-pay | Source: Home / Self Care | Attending: Orthopedic Surgery

## 2022-12-16 ENCOUNTER — Ambulatory Visit (HOSPITAL_COMMUNITY): Payer: Medicaid Other

## 2022-12-16 ENCOUNTER — Inpatient Hospital Stay (HOSPITAL_COMMUNITY)
Admission: RE | Admit: 2022-12-16 | Discharge: 2022-12-22 | DRG: 472 | Disposition: A | Discharge status: Rehabilitation Facility | Payer: Medicaid Other | Attending: Orthopedic Surgery | Admitting: Orthopedic Surgery

## 2022-12-16 ENCOUNTER — Other Ambulatory Visit: Payer: Self-pay

## 2022-12-16 ENCOUNTER — Ambulatory Visit (HOSPITAL_COMMUNITY): Payer: Medicaid Other | Admitting: Physician Assistant

## 2022-12-16 ENCOUNTER — Ambulatory Visit (HOSPITAL_BASED_OUTPATIENT_CLINIC_OR_DEPARTMENT_OTHER): Payer: Medicaid Other | Admitting: Certified Registered"

## 2022-12-16 DIAGNOSIS — G709 Myoneural disorder, unspecified: Secondary | ICD-10-CM | POA: Diagnosis not present

## 2022-12-16 DIAGNOSIS — G992 Myelopathy in diseases classified elsewhere: Secondary | ICD-10-CM | POA: Diagnosis present

## 2022-12-16 DIAGNOSIS — G959 Disease of spinal cord, unspecified: Secondary | ICD-10-CM | POA: Diagnosis not present

## 2022-12-16 DIAGNOSIS — M4712 Other spondylosis with myelopathy, cervical region: Secondary | ICD-10-CM

## 2022-12-16 DIAGNOSIS — E876 Hypokalemia: Secondary | ICD-10-CM | POA: Diagnosis present

## 2022-12-16 DIAGNOSIS — M2578 Osteophyte, vertebrae: Secondary | ICD-10-CM | POA: Diagnosis present

## 2022-12-16 DIAGNOSIS — M4802 Spinal stenosis, cervical region: Principal | ICD-10-CM | POA: Diagnosis present

## 2022-12-16 DIAGNOSIS — Z5900 Homelessness unspecified: Secondary | ICD-10-CM

## 2022-12-16 DIAGNOSIS — F17210 Nicotine dependence, cigarettes, uncomplicated: Secondary | ICD-10-CM | POA: Diagnosis present

## 2022-12-16 DIAGNOSIS — E46 Unspecified protein-calorie malnutrition: Secondary | ICD-10-CM | POA: Diagnosis present

## 2022-12-16 DIAGNOSIS — R1310 Dysphagia, unspecified: Secondary | ICD-10-CM | POA: Diagnosis not present

## 2022-12-16 DIAGNOSIS — G4700 Insomnia, unspecified: Secondary | ICD-10-CM | POA: Diagnosis present

## 2022-12-16 DIAGNOSIS — D62 Acute posthemorrhagic anemia: Secondary | ICD-10-CM | POA: Diagnosis not present

## 2022-12-16 DIAGNOSIS — Z681 Body mass index (BMI) 19 or less, adult: Secondary | ICD-10-CM

## 2022-12-16 DIAGNOSIS — R64 Cachexia: Secondary | ICD-10-CM | POA: Diagnosis present

## 2022-12-16 DIAGNOSIS — F1010 Alcohol abuse, uncomplicated: Secondary | ICD-10-CM | POA: Diagnosis present

## 2022-12-16 DIAGNOSIS — E871 Hypo-osmolality and hyponatremia: Secondary | ICD-10-CM | POA: Diagnosis present

## 2022-12-16 HISTORY — DX: Unspecified coma: R40.20

## 2022-12-16 HISTORY — DX: Cholesterolosis of gallbladder: K82.4

## 2022-12-16 HISTORY — DX: Cyst of kidney, acquired: N28.1

## 2022-12-16 HISTORY — DX: Alcohol abuse, uncomplicated: F10.10

## 2022-12-16 HISTORY — PX: ANTERIOR CERVICAL DECOMP/DISCECTOMY FUSION: SHX1161

## 2022-12-16 SURGERY — ANTERIOR CERVICAL DECOMPRESSION/DISCECTOMY FUSION 1 LEVEL
Anesthesia: General | Site: Spine Cervical

## 2022-12-16 MED ORDER — PROPOFOL 500 MG/50ML IV EMUL
INTRAVENOUS | Status: DC | PRN
  Administered 2022-12-16: 100 ug/kg/min via INTRAVENOUS

## 2022-12-16 MED ORDER — PHENYLEPHRINE HCL-NACL 20-0.9 MG/250ML-% IV SOLN
INTRAVENOUS | Status: DC | PRN
  Administered 2022-12-16: 40 ug/min via INTRAVENOUS

## 2022-12-16 MED ORDER — SUFENTANIL CITRATE 50 MCG/ML IV SOLN
INTRAVENOUS | Status: AC
  Filled 2022-12-16: qty 1

## 2022-12-16 MED ORDER — LACTATED RINGERS IV SOLN: INTRAVENOUS | Status: DC

## 2022-12-16 MED ORDER — PHENYLEPHRINE 80 MCG/ML (10ML) SYRINGE FOR IV PUSH (FOR BLOOD PRESSURE SUPPORT)
PREFILLED_SYRINGE | INTRAVENOUS | Status: DC | PRN
  Administered 2022-12-16: 160 ug via INTRAVENOUS

## 2022-12-16 MED ORDER — THROMBIN 20000 UNITS EX KIT
PACK | CUTANEOUS | Status: DC | PRN
  Administered 2022-12-16: 20000 [IU] via TOPICAL

## 2022-12-16 MED ORDER — MIDAZOLAM HCL 2 MG/2ML IJ SOLN
INTRAMUSCULAR | Status: AC
  Filled 2022-12-16: qty 2

## 2022-12-16 MED ORDER — ACETAMINOPHEN 500 MG PO TABS
1000.0000 mg | ORAL_TABLET | Freq: Three times a day (TID) | ORAL | Status: AC
  Administered 2022-12-17 (×2): 1000 mg via ORAL
  Filled 2022-12-16 (×3): qty 2

## 2022-12-16 MED ORDER — ORAL CARE MOUTH RINSE: 15.0000 mL | Freq: Once | OROMUCOSAL | Status: AC

## 2022-12-16 MED ORDER — ONDANSETRON HCL 4 MG PO TABS: 4.0000 mg | ORAL_TABLET | Freq: Four times a day (QID) | ORAL | Status: DC | PRN

## 2022-12-16 MED ORDER — CEFAZOLIN SODIUM-DEXTROSE 2-4 GM/100ML-% IV SOLN
2.0000 g | Freq: Four times a day (QID) | INTRAVENOUS | Status: AC
  Administered 2022-12-16 – 2022-12-17 (×3): 2 g via INTRAVENOUS
  Filled 2022-12-16 (×3): qty 100

## 2022-12-16 MED ORDER — OXYCODONE HCL 5 MG PO TABS: 5.0000 mg | ORAL_TABLET | ORAL | Status: DC | PRN

## 2022-12-16 MED ORDER — DEXAMETHASONE SODIUM PHOSPHATE 10 MG/ML IJ SOLN: 10.0000 mg | Freq: Once | INTRAMUSCULAR | Status: DC

## 2022-12-16 MED ORDER — ONDANSETRON HCL 4 MG/2ML IJ SOLN
INTRAMUSCULAR | Status: DC | PRN
  Administered 2022-12-16: 4 mg via INTRAVENOUS

## 2022-12-16 MED ORDER — ONDANSETRON HCL 4 MG/2ML IJ SOLN: 4.0000 mg | Freq: Four times a day (QID) | INTRAMUSCULAR | Status: DC | PRN

## 2022-12-16 MED ORDER — LACTATED RINGERS IV SOLN: INTRAVENOUS | Status: DC | PRN

## 2022-12-16 MED ORDER — SODIUM CHLORIDE (PF) 0.9 % IJ SOLN
INTRAMUSCULAR | Status: AC
  Filled 2022-12-16: qty 10

## 2022-12-16 MED ORDER — HYDROMORPHONE HCL 1 MG/ML IJ SOLN
INTRAMUSCULAR | Status: AC
  Filled 2022-12-16: qty 0.5

## 2022-12-16 MED ORDER — EPHEDRINE SULFATE-NACL 50-0.9 MG/10ML-% IV SOSY
PREFILLED_SYRINGE | INTRAVENOUS | Status: DC | PRN
  Administered 2022-12-16: 5 mg via INTRAVENOUS

## 2022-12-16 MED ORDER — FENTANYL CITRATE (PF) 100 MCG/2ML IJ SOLN: 25.0000 ug | INTRAMUSCULAR | Status: DC | PRN

## 2022-12-16 MED ORDER — SUCCINYLCHOLINE CHLORIDE 200 MG/10ML IV SOSY
PREFILLED_SYRINGE | INTRAVENOUS | Status: DC | PRN
  Administered 2022-12-16: 60 mg via INTRAVENOUS

## 2022-12-16 MED ORDER — PROPOFOL 10 MG/ML IV BOLUS
INTRAVENOUS | Status: DC | PRN
  Administered 2022-12-16: 120 mg via INTRAVENOUS

## 2022-12-16 MED ORDER — CHLORHEXIDINE GLUCONATE 0.12 % MT SOLN
OROMUCOSAL | Status: AC
  Administered 2022-12-16: 15 mL via OROMUCOSAL
  Filled 2022-12-16: qty 15

## 2022-12-16 MED ORDER — MIDAZOLAM HCL 2 MG/2ML IJ SOLN
INTRAMUSCULAR | Status: DC | PRN
  Administered 2022-12-16: .5 mg via INTRAVENOUS
  Administered 2022-12-16: 1 mg via INTRAVENOUS
  Administered 2022-12-16: 2 mg via INTRAVENOUS

## 2022-12-16 MED ORDER — GLYCOPYRROLATE 0.2 MG/ML IJ SOLN
INTRAMUSCULAR | Status: DC | PRN
  Administered 2022-12-16: .2 mg via INTRAVENOUS

## 2022-12-16 MED ORDER — POVIDONE-IODINE 10 % EX SWAB
2.0000 | Freq: Once | CUTANEOUS | Status: AC
  Administered 2022-12-16: 2 via TOPICAL

## 2022-12-16 MED ORDER — OXYCODONE HCL 5 MG PO TABS: 5.0000 mg | ORAL_TABLET | Freq: Once | ORAL | Status: DC | PRN

## 2022-12-16 MED ORDER — TRANEXAMIC ACID-NACL 1000-0.7 MG/100ML-% IV SOLN
1000.0000 mg | INTRAVENOUS | Status: AC
  Administered 2022-12-16: 1000 mg via INTRAVENOUS

## 2022-12-16 MED ORDER — DOCUSATE SODIUM 100 MG PO CAPS
100.0000 mg | ORAL_CAPSULE | Freq: Two times a day (BID) | ORAL | Status: DC
  Administered 2022-12-16 – 2022-12-22 (×8): 100 mg via ORAL
  Filled 2022-12-16 (×12): qty 1

## 2022-12-16 MED ORDER — PROPOFOL 1000 MG/100ML IV EMUL
INTRAVENOUS | Status: AC
  Filled 2022-12-16: qty 200

## 2022-12-16 MED ORDER — METHOCARBAMOL 500 MG PO TABS
500.0000 mg | ORAL_TABLET | Freq: Four times a day (QID) | ORAL | Status: DC
  Administered 2022-12-16 – 2022-12-21 (×20): 500 mg via ORAL
  Filled 2022-12-16 (×20): qty 1

## 2022-12-16 MED ORDER — SUGAMMADEX SODIUM 500 MG/5ML IV SOLN
INTRAVENOUS | Status: AC
  Filled 2022-12-16: qty 5

## 2022-12-16 MED ORDER — CEFAZOLIN SODIUM-DEXTROSE 2-4 GM/100ML-% IV SOLN
INTRAVENOUS | Status: AC
  Filled 2022-12-16: qty 100

## 2022-12-16 MED ORDER — LIDOCAINE 2% (20 MG/ML) 5 ML SYRINGE
INTRAMUSCULAR | Status: DC | PRN
  Administered 2022-12-16: 60 mg via INTRAVENOUS

## 2022-12-16 MED ORDER — LIDOCAINE 2% (20 MG/ML) 5 ML SYRINGE
INTRAMUSCULAR | Status: AC
  Filled 2022-12-16: qty 10

## 2022-12-16 MED ORDER — HYDROMORPHONE HCL 1 MG/ML IJ SOLN
INTRAMUSCULAR | Status: DC | PRN
  Administered 2022-12-16: .5 mg via INTRAVENOUS

## 2022-12-16 MED ORDER — AMISULPRIDE (ANTIEMETIC) 5 MG/2ML IV SOLN: 10.0000 mg | Freq: Once | INTRAVENOUS | Status: DC | PRN

## 2022-12-16 MED ORDER — ONDANSETRON HCL 4 MG/2ML IJ SOLN
INTRAMUSCULAR | Status: AC
  Filled 2022-12-16: qty 2

## 2022-12-16 MED ORDER — OXYCODONE HCL 5 MG/5ML PO SOLN: 5.0000 mg | Freq: Once | ORAL | Status: DC | PRN

## 2022-12-16 MED ORDER — 0.9 % SODIUM CHLORIDE (POUR BTL) OPTIME
TOPICAL | Status: DC | PRN
  Administered 2022-12-16: 1000 mL

## 2022-12-16 MED ORDER — CHLORHEXIDINE GLUCONATE 0.12 % MT SOLN: 15.0000 mL | Freq: Once | OROMUCOSAL | Status: AC

## 2022-12-16 MED ORDER — CEFAZOLIN SODIUM-DEXTROSE 2-4 GM/100ML-% IV SOLN
2.0000 g | INTRAVENOUS | Status: AC
  Administered 2022-12-16: 2 g via INTRAVENOUS

## 2022-12-16 MED ORDER — ACETAMINOPHEN 10 MG/ML IV SOLN
INTRAVENOUS | Status: DC | PRN
  Administered 2022-12-16: 1000 mg via INTRAVENOUS

## 2022-12-16 MED ORDER — THROMBIN 20000 UNITS EX SOLR
CUTANEOUS | Status: AC
  Filled 2022-12-16: qty 20000

## 2022-12-16 MED ORDER — DEXAMETHASONE SODIUM PHOSPHATE 10 MG/ML IJ SOLN
INTRAMUSCULAR | Status: DC | PRN
  Administered 2022-12-16: 10 mg

## 2022-12-16 MED ORDER — DEXAMETHASONE SODIUM PHOSPHATE 10 MG/ML IJ SOLN
INTRAMUSCULAR | Status: AC
  Filled 2022-12-16: qty 1

## 2022-12-16 MED ORDER — ROCURONIUM BROMIDE 10 MG/ML (PF) SYRINGE
PREFILLED_SYRINGE | INTRAVENOUS | Status: AC
  Filled 2022-12-16: qty 20

## 2022-12-16 MED ORDER — SUFENTANIL CITRATE 50 MCG/ML IV SOLN
INTRAVENOUS | Status: DC | PRN
  Administered 2022-12-16 (×2): 10 ug via INTRAVENOUS

## 2022-12-16 MED ORDER — ACETAMINOPHEN 10 MG/ML IV SOLN
INTRAVENOUS | Status: AC
  Filled 2022-12-16: qty 100

## 2022-12-16 MED ORDER — TRANEXAMIC ACID-NACL 1000-0.7 MG/100ML-% IV SOLN
1000.0000 mg | Freq: Once | INTRAVENOUS | Status: AC
  Administered 2022-12-16: 1000 mg via INTRAVENOUS
  Filled 2022-12-16: qty 100

## 2022-12-16 MED ORDER — TRANEXAMIC ACID-NACL 1000-0.7 MG/100ML-% IV SOLN
INTRAVENOUS | Status: AC
  Filled 2022-12-16: qty 100

## 2022-12-16 SURGICAL SUPPLY — 78 items
AGENT HMST KT MTR STRL THRMB (HEMOSTASIS)
ALLOGRAFT LORDOTIC CC 7X11X14 (Bone Implant) IMPLANT
APL SKNCLS STERI-STRIP NONHPOA (GAUZE/BANDAGES/DRESSINGS) ×1
BAG COUNTER SPONGE SURGICOUNT (BAG) ×1 IMPLANT
BAG SPNG CNTER NS LX DISP (BAG) ×1
BAND INSRT 18 STRL LF DISP RB (MISCELLANEOUS) ×2
BAND RUBBER #18 3X1/16 STRL (MISCELLANEOUS) ×2 IMPLANT
BENZOIN TINCTURE PRP APPL 2/3 (GAUZE/BANDAGES/DRESSINGS) IMPLANT
BLADE CLIPPER SURG (BLADE) IMPLANT
BUR MATCHSTICK NEURO 3.0 HP (BURR) ×1 IMPLANT
BUR MATCHSTICK NEURO 3.0 LAGG (BURR) IMPLANT
CABLE BIPOLOR RESECTION CORD (MISCELLANEOUS) ×1 IMPLANT
CANISTER SUCT 3000ML PPV (MISCELLANEOUS) ×1 IMPLANT
CLSR STERI-STRIP ANTIMIC 1/2X4 (GAUZE/BANDAGES/DRESSINGS) ×1 IMPLANT
COVER MAYO STAND STRL (DRAPES) ×3 IMPLANT
COVER SURGICAL LIGHT HANDLE (MISCELLANEOUS) ×2 IMPLANT
DRAIN CHANNEL 15F RND FF W/TCR (WOUND CARE) IMPLANT
DRAPE C-ARM 42X72 X-RAY (DRAPES) ×1 IMPLANT
DRAPE LAPAROTOMY 100X72X124 (DRAPES) ×1 IMPLANT
DRAPE MICROSCOPE LEICA (MISCELLANEOUS) ×1 IMPLANT
DRAPE POUCH INSTRU U-SHP 10X18 (DRAPES) ×1 IMPLANT
DRAPE SURG 17X23 STRL (DRAPES) ×1 IMPLANT
DRAPE U-SHAPE 47X51 STRL (DRAPES) ×1 IMPLANT
DRAPE UTILITY XL STRL (DRAPES) ×1 IMPLANT
DRSG OPSITE POSTOP 3X4 (GAUZE/BANDAGES/DRESSINGS) ×1 IMPLANT
DURAPREP 26ML APPLICATOR (WOUND CARE) ×1 IMPLANT
ELECT COATED BLADE 2.86 ST (ELECTRODE) ×1 IMPLANT
ELECT PENCIL ROCKER SW 15FT (MISCELLANEOUS) ×1 IMPLANT
ELECT REM PT RETURN 9FT ADLT (ELECTROSURGICAL) ×1
ELECTRODE REM PT RTRN 9FT ADLT (ELECTROSURGICAL) ×1 IMPLANT
FEE INTRAOP CADWELL SUPPLY NCS (MISCELLANEOUS) IMPLANT
FEE INTRAOP MONITOR IMPULS NCS (MISCELLANEOUS) IMPLANT
GAUZE SPONGE 4X4 12PLY STRL (GAUZE/BANDAGES/DRESSINGS) IMPLANT
GLOVE BIO SURGEON STRL SZ 6.5 (GLOVE) ×1 IMPLANT
GLOVE BIOGEL PI IND STRL 6.5 (GLOVE) ×1 IMPLANT
GLOVE BIOGEL PI IND STRL 8.5 (GLOVE) ×1 IMPLANT
GLOVE SS BIOGEL STRL SZ 8.5 (GLOVE) ×1 IMPLANT
GOWN STRL REUS W/ TWL LRG LVL3 (GOWN DISPOSABLE) ×1 IMPLANT
GOWN STRL REUS W/TWL 2XL LVL3 (GOWN DISPOSABLE) ×1 IMPLANT
GOWN STRL REUS W/TWL LRG LVL3 (GOWN DISPOSABLE) ×1
INTRAOP CADWELL SUPPLY FEE NCS (MISCELLANEOUS) ×1
INTRAOP DISP SUPPLY FEE NCS (MISCELLANEOUS) ×1
INTRAOP MONITOR FEE IMPULS NCS (MISCELLANEOUS) ×1
INTRAOP MONITOR FEE IMPULSE (MISCELLANEOUS) ×1
KIT BASIN OR (CUSTOM PROCEDURE TRAY) ×1 IMPLANT
KIT TURNOVER KIT B (KITS) ×1 IMPLANT
NDL SPNL 18GX3.5 QUINCKE PK (NEEDLE) ×1 IMPLANT
NEEDLE HYPO 22GX1.5 SAFETY (NEEDLE) ×1 IMPLANT
NEEDLE SPNL 18GX3.5 QUINCKE PK (NEEDLE) ×1 IMPLANT
NS IRRIG 1000ML POUR BTL (IV SOLUTION) ×1 IMPLANT
PACK ORTHO CERVICAL (CUSTOM PROCEDURE TRAY) ×1 IMPLANT
PACK UNIVERSAL I (CUSTOM PROCEDURE TRAY) ×1 IMPLANT
PAD ARMBOARD 7.5X6 YLW CONV (MISCELLANEOUS) ×2 IMPLANT
PATTIES SURGICAL .25X.25 (GAUZE/BANDAGES/DRESSINGS) IMPLANT
PIN ACP TEMP FIXATION (EXFIX) IMPLANT
PLATE ACP 1-LEVEL 1.6V20 (Plate) IMPLANT
PLATE ACP 1-LEVEL1.6V22 (Plate) IMPLANT
POSITIONER HEAD DONUT 9IN (MISCELLANEOUS) ×1 IMPLANT
RESTRAINT LIMB HOLDER UNIV (RESTRAINTS) ×1 IMPLANT
SCREW ACP 3.5 X 13 S/D VARIA (Screw) ×4 IMPLANT
SCREW ACP 3.5X13 S/D VAR ANGLE (Screw) IMPLANT
SCREW ACP VA ST 4X13 (Screw) IMPLANT
SPONGE INTESTINAL PEANUT (DISPOSABLE) ×1 IMPLANT
SPONGE SURGIFOAM ABS GEL SZ50 (HEMOSTASIS) ×1 IMPLANT
SURGIFLO W/THROMBIN 8M KIT (HEMOSTASIS) IMPLANT
SUT BONE WAX W31G (SUTURE) ×1 IMPLANT
SUT MNCRL AB 3-0 PS2 27 (SUTURE) ×1 IMPLANT
SUT MNCRL+ AB 3-0 CT1 36 (SUTURE) IMPLANT
SUT MON AB 2-0 CT1 27 (SUTURE) IMPLANT
SUT MON AB 2-0 CT1 36 (SUTURE) IMPLANT
SUT MONOCRYL AB 3-0 CT1 36IN (SUTURE)
SUT VIC AB 2-0 CT1 18 (SUTURE) ×1 IMPLANT
SYR BULB IRRIG 60ML STRL (SYRINGE) ×1 IMPLANT
TAPE CLOTH 4X10 WHT NS (GAUZE/BANDAGES/DRESSINGS) ×1 IMPLANT
TAPE CLOTH SURG 6X10 WHT LF (GAUZE/BANDAGES/DRESSINGS) IMPLANT
TOWEL GREEN STERILE (TOWEL DISPOSABLE) ×1 IMPLANT
TOWEL GREEN STERILE FF (TOWEL DISPOSABLE) ×1 IMPLANT
WATER STERILE IRR 1000ML POUR (IV SOLUTION) ×1 IMPLANT

## 2022-12-16 NOTE — Transfer of Care (Signed)
Immediate Anesthesia Transfer of Care Note  Patient: Samuel [name]  Procedure(s) Performed: CERVICAL THREE FOUR ANTERIOR CERVICAL DISCENTOMY AND FUSION (Spine Cervical)  Patient Location: PACU  Anesthesia Type:General  Level of Consciousness: drowsy  Airway & Oxygen Therapy: Patient Spontanous Breathing and Patient connected to face mask oxygen  Post-op Assessment: Report given to RN, Post -op Vital signs reviewed and stable, and Patient moving all extremities X 4  Post vital signs: Reviewed and stable  Last Vitals:  Vitals Value Taken Time  BP 123/92 12/16/22 1825  Temp    Pulse 78 12/16/22 1828  Resp 13 12/16/22 1828  SpO2 96 % 12/16/22 1828  Vitals shown include unvalidated device data.  Last Pain:  Vitals:   12/16/22 1112  TempSrc:   PainSc: 0-No pain         Complications: There were no known notable events for this encounter.

## 2022-12-16 NOTE — Brief Op Note (Signed)
12/16/2022  6:08 PM  PATIENT:  Samuel [name]  62 y.o. male  PRE-OPERATIVE DIAGNOSIS:  CERVICAL MYELOPATHY  POST-OPERATIVE DIAGNOSIS:  CERVICAL MYELOPATHY  PROCEDURE:  Procedure(s): CERVICAL THREE FOUR ANTERIOR CERVICAL DISCENTOMY AND FUSION (N/A)  SURGEON:  Surgeon(s) and Role:    * Callie Fielding, MD - Primary  PHYSICIAN ASSISTANT:   ASSISTANTS: none   ANESTHESIA:   general  EBL:  30 mL   BLOOD ADMINISTERED:none  DRAINS: Penrose drain in the neck    LOCAL MEDICATIONS USED:  NONE  SPECIMEN:  No Specimen  DISPOSITION OF SPECIMEN:  N/A  COUNTS:  YES  TOURNIQUET:  None  DICTATION: .Note written in EPIC  PLAN OF CARE: Admit for overnight observation  PATIENT DISPOSITION:  PACU - hemodynamically stable.   Delay start of Pharmacological VTE agent (>24hrs) due to surgical blood loss or risk of bleeding: yes

## 2022-12-16 NOTE — Anesthesia Preprocedure Evaluation (Addendum)
Anesthesia Evaluation  Patient identified by MRN, date of birth, ID band Patient awake    Reviewed: Allergy & Precautions, NPO status , Patient's Chart, lab work & pertinent test results  History of Anesthesia Complications Negative for: history of anesthetic complications  Airway Mallampati: III  TM Distance: >3 FB Neck ROM: Limited    Dental  (+) Dental Advisory Given, Poor Dentition   Pulmonary neg shortness of breath, neg sleep apnea, neg COPD, neg recent URI, Current Smoker and Patient abstained from smoking.   Pulmonary exam normal breath sounds clear to auscultation       Cardiovascular negative cardio ROS  Rhythm:Regular Rate:Normal     Neuro/Psych neg Seizures  Neuromuscular disease (cervical myelopathy)    GI/Hepatic ,neg GERD  ,,(+)     substance abuse  alcohol use and cocaine usepancreatitis   Endo/Other  negative endocrine ROS    Renal/GU negative Renal ROS     Musculoskeletal   Abdominal   Peds  Hematology negative hematology ROS (+)   Anesthesia Other Findings 62 y.o. male with symptoms of progressive myelopathy.  His MRI shows C3/4 stenosis with T2 cord signal change.  Reproductive/Obstetrics                              Anesthesia Physical Anesthesia Plan  ASA: 3  Anesthesia Plan: General   Post-op Pain Management: Tylenol PO (pre-op)*   Induction: Intravenous  PONV Risk Score and Plan: 1 and Ondansetron, Dexamethasone and Treatment may vary due to age or medical condition  Airway Management Planned: Oral ETT and Video Laryngoscope Planned  Additional Equipment:   Intra-op Plan:   Post-operative Plan: Extubation in OR  Informed Consent: I have reviewed the patients History and Physical, chart, labs and discussed the procedure including the risks, benefits and alternatives for the proposed anesthesia with the patient or authorized representative who has  indicated his/her understanding and acceptance.     Dental advisory given  Plan Discussed with: CRNA and Anesthesiologist  Anesthesia Plan Comments: (Risks of general anesthesia discussed including, but not limited to, sore throat, hoarse voice, chipped/damaged teeth, injury to vocal cords, nausea and vomiting, allergic reactions, lung infection, heart attack, stroke, and death. All questions answered. )         Anesthesia Quick Evaluation

## 2022-12-16 NOTE — Progress Notes (Signed)
Orthopedic Surgery Post-operative Progress Note  Assessment: Patient is a 62 y.o. male who is currently admitted after undergoing C3/4 ACDF   Plan: -Operative plans complete -Drain to be removed tomorrow -Out of bed as tolerated with aspen collar -No bending/lifting/twisting greater than 10 pounds -PT/OT evaluation and treat -Pain control -Regular diet -Ancef  x2 post-operative doses -No antiplatelet or dvt chemoprophylaxis for 72 hours after surgery -Disposition: to floor from PACU  _________________________________________________________________________  Subjective: No acute events since surgery. Recovering in PACU. Pain well controlled.   Objective:  General: no acute distress, appropriate affect Neurologic: alert, answering questions appropriately, following commands Respiratory: unlabored breathing on room air Neck: no hematoma appreciated, collar in place Skin: dressing clear/dry/intact, drain under dressings  MSK (spine):   -Strength exam                                                   Left                  Right Grip strength                3/5                  3/5 Interosseus                  3/5                  3/5 Wrist extension            3/5                  4/5 Wrist flexion                 4/5                  4/5 Elbow extension           4/5                  4/5 Elbow flexion                4/5                  4/5 Deltoid                          4/5                  4/5   EHL                              4/5                  4/5 TA                                 4/5                  4/5 GSC                             4/5  4/5 Knee extension            4/5                  4/5 Knee flexion                 4/5                  4/5 Hip flexion                    4/5                  4/5   -Sensory exam                           Sensation intact to light touch in L3-S1 nerve distributions of bilateral lower extremities  (decreased in all distributions)             Sensation intact to light touch in C5-T1 nerve distributions of bilateral upper extremities (decreased in all distributions)   Patient name: Samuel [name] Patient MRN: [id number] Date: 12/16/22

## 2022-12-16 NOTE — Anesthesia Procedure Notes (Addendum)
Procedure Name: Intubation Date/Time: 12/16/2022 12:57 PM  Performed by: Anastasio Auerbach, CRNAPre-anesthesia Checklist: Patient identified, Emergency Drugs available, Suction available and Patient being monitored Patient Re-evaluated:Patient Re-evaluated prior to induction Oxygen Delivery Method: Circle system utilized Preoxygenation: Pre-oxygenation with 100% oxygen Induction Type: IV induction Ventilation: Mask ventilation without difficulty Laryngoscope Size: Glidescope and 3 Grade View: Grade I Tube type: Oral Tube size: 7.5 mm Number of attempts: 1 Airway Equipment and Method: Stylet and Oral airway Placement Confirmation: ETT inserted through vocal cords under direct vision, positive ETCO2 and breath sounds checked- equal and bilateral Secured at: 22 cm Tube secured with: Tape Dental Injury: Teeth and Oropharynx as per pre-operative assessment  Comments: Neck stable at tall times

## 2022-12-16 NOTE — H&P (Addendum)
Orthopedic Spine Surgery H&P Note  Assessment: Patient is a 62 y.o. male with cervical myelopathy   Plan: -Out of bed as tolerated, activity as tolerated, no brace -Covered the risks of surgery one more time with the patient and patient elected to proceed with planned surgery -Written consent verified -Hold anticoagulation in anticipation of surgery -Ancef on all to OR -NPO for procedure -Site marked -To OR when ready  The patient has signs and symptoms consistent with cervical myelopathy. The most common natural history of cervical myelopathy was explained to the patient. Given this course, operative management in the form of C3/4 anterior cervical discectomy and fusion was recommended to the patient. The risks, including but not limited to pseudarthrosis, dysphagia, hematoma, airway compromise, recurrent laryngeal nerve injury, esophageal perforation, durotomy, spinal cord injury, nerve root injury, persistent pain, adjacent segment disease, infection, bleeding, hardware failure, vascular injury, heart attack, death, stroke, fracture, and need for additional procedures were discussed with the patient. Discussed that his risk on infection and not healing his incision were higher given his low BMI. The benefit of surgery would be preventing progression of the myelopathy and not to reverse any myelopathic symptoms. The alternatives to surgical management would be continued monitoring, physical therapy, over-the-counter pain medications, ambulatory aids, and activity modification. I reiterated that these modalities would not change the natural history of the disease and that is why operative management has been recommended. All the patient's questions were answered to their satisfaction. After this discussion, the patient expressed understanding and elected to proceed with surgical intervention.     ___________________________________________________________________________  Chief Complaint:  decreased hand dexterity, difficulty ambulation, numbness in his hands  History: Patient is 62 y.o. male who has been previously seen in the office for cervical myelopathy. Given the natural history of myelopathy, operative management was recommended to the patient at our last visit. The patient presents today with no changes in his symptoms since the last office visit. See previous office note for further details.    Review of systems: General: denies fevers and chills, myalgias Neurologic: denies recent changes in vision, slurred speech Abdomen: denies nausea, vomiting, hematemesis Respiratory: denies cough, shortness of breath  Past medical history: Pancreatitis Chart history of alcohol abuse Chart history of cocaine abuse   Allergies: NKDA   Past surgical history:  None   Social history: Reports use of nicotine product (smoking, vaping, patches, smokeless) - 0.5ppd Alcohol use: Yes, 6 drinks per week Denies recreational drug use Currently homeless  Family history: -reviewed and not pertinent to cervical myelopathy   Physical Exam:  BMI 16.1  General: no acute distress, appears stated age Neurologic: alert, answering questions appropriately, following commands Cardiovascular: regular rate, no cyanosis Respiratory: unlabored breathing on room air, symmetric chest rise Psychiatric: appropriate affect, normal cadence to speech   MSK (spine):  -Strength exam      Left  Right Grip strength                3/5  3/5 Interosseus   3/5   3/5 Wrist extension  3/5  4/5 Wrist flexion   4/5  4/5 Elbow extension  4/5  4/5 Elbow flexion   4/5  4/5 Deltoid    4/5  4/5  EHL    4/5  4/5 TA    4/5  4/5 GSC    4/5  4/5 Knee extension  4/5  4/5 Knee flexion   4/5  4/5 Hip flexion   4/5  4/5  -Sensory exam  Sensation intact to light touch in L3-S1 nerve distributions of bilateral lower extremities (decreased in all distributions)  Sensation intact to light touch in C5-T1  nerve distributions of bilateral upper extremities (decreased in all distributions)   Patient name: Samuel [name] Patient MRN: [id number] Date: 12/16/22

## 2022-12-16 NOTE — Op Note (Signed)
Orthopedic Spine Surgery Operative Report  Procedure: C3/4 anterior cervical discectomy and fusion C3/4 placement of structural allograft interbody spacer  C3/4 anterior plate instrumentation Placement and removal of Gardner-Wells tongs for disc space distraction  Modifier: none  Date of procedure: 12/16/2022  Patient name: Samuel [name] MRN: [id number] DOB: 08-Jul-1961  Surgeon: Ileene Rubens, MD Assistant: None Pre-operative diagnosis: cervical myelopathy Post-operative diagnosis: same as above Findings: Anterior osteophytes at C3/4  Specimens: none Anesthesia: general EBL: [id number] Complications: none Pre-incision antibiotic: ancef Pre-incision decadron: '10mg'$   Implants:  69m Nuvasive anterior cervical plate 7x14x11 allograft spacer 4.0x138mNuvasive screws (x4)   Indication for procedure: Patient is a [age].o. male who presented to the office with signs and symptoms consistent with cervical myelopathy. The natural history of cervical myelopathy was explained to the patient. After this discussion, surgical intervention was recommended. The pre-operative MRI showed stenosis at C3/4 with myelomalacia so a C3/4 anterior cervical discectomy and fusion was presented as a treatment option. The risks, including but not limited to pseudarthrosis, dysphagia, hematoma, airway compromise, recurrent laryngeal nerve injury, esophageal perforation, durotomy, spinal cord injury, nerve root injury, persistent pain, adjacent segment disease, infection, bleeding, hardware failure, vascular injury, heart attack, death, stroke, fracture, and need for additional procedures were discussed with the patient. The benefit of surgery would be preventing progression of the myelopathy and not to reverse any myelopathic symptoms. The alternatives to surgical management would be continued monitoring, physical therapy, over-the-counter pain medications, ambulatory aids, and activity modification. I reiterated that  these modalities would not change the natural history of the disease and that is why operative management has been recommended. All the patient's questions were answered to their satisfaction. After this discussion, the patient expressed understanding and elected to proceed with surgical intervention.    Procedure Description: The patient was met in the pre-operative holding area. The patient's identity and consent were verified. The operative site was marked. The patient's remaining questions about the surgery were answered. The patient was brought back to the operating room. General anesthesia was induced and an endotracheal tube was placed by the anesthesia staff. The patient was transferred to the flat top JaCavaleroable in the supine position. All bony prominences were well padded. A bump was placed underneath the patient's shoulders to extend the neck slightly.  Neuromonitoring leads were attached to the patient by the neuromonitoring technologist. Gardner-Wells tongs were attached to the patient's head about 1cm above the pinna in line with the external auditory meatus. Baseline neuromonitoring signals were obtained. 15 pounds of weight was then attached to the tongs. There was no change in the neuromonitoring signals after the weight was attached. The patient's shoulders were than taped to the bed to improve the fluoroscopic visualization during the procedure. His facial hair over the neck was shaved with an elCopyThe surgical area was cleansed with alcohol. Fluoroscopy was then brought in to check rotation on the AP image and to mark the levels on the lateral image. The patient's skin was then prepped and draped in a standard, sterile fashion. A time out was performed that identified the patient, the procedure, and the operative levels. All team members agreed with what was stated in the time out.   A left-sided transverse incision was made in the middle of the previously marked levels.  Incision was taken sharply down through the skin and dermis. Electrocautery was used to dissect down to the level of the platysma. A Metzenbaum scissors was used to elevate flaps both  cranially and caudally above the platysma. A small opening was made in the platysma with the Metzenbaum scissors. The scissors were then placed under the platysma and electrocautery was used on top of the scissors to split the platysma. Blunt dissection was carried out medial to the sternocleidomastoid to identify the omohyoid. The omohyoid was then dissected over its lateral aspect with the Metzenbaum scissors. An appendiceal retractor was placed around the carotid sheath to retract it laterally and a cloward retractor was placed medially to retractor the esophagus and trachea medially. The interval between these structures was then bluntly dissected with the Metzenbaum scissors. At this point, the prevertebral fascia was visible within the wound. A kittner was used to dissect the prevertebral fascia off the vertebral bodies and discs. A metallic sucker tip was placed over the disc thought to be the correct level. A lateral fluoroscopic shot was then used to confirm the level. Once the correct level was identified, electrocautery was used to mark the disc space. The retractors that were previously placed were then put back into the wound.   Electrocautery was used to elevate the longus coli off the bone on each side. Shadowline retractor blades were then placed under the longus coli on each side and the self-retainer was attached to these retractor blades. A long handle fifteen blade was then used to incise the disc space along the endplates and longitudinally to connect these end plate incisions to create a rectangular incision in the disc. A pituitary was used to remove the incised disc material. A 2 kerrison was used to remove the anterior osteophyte and square the inferior endplate of the cranial vertebra. A combination of  straight and curved curettes were used to remove further disc material and the cartilaginous aspect of the endplates. This was done until both the inferior and superior endplates had been prepared from the uncus to uncus and from ventral vertebral body to the posterior osteophytes. At this point, the microscope was brought in and a burr was then used to remove the posterior vertebral osteophytes. Once the PLL was visualized within the wound, a nerve hook was used to develop the plane between the PLL and the dura. While lifting up the PLL with the rhoton, a 1 kerrison was placed under the PLL and used to remove the PLL. Once some of the PLL had been removed, a combination of 1 and 2 kerrisons were used to continue removing the PLL until it had been completely removed. A curved curette was then placed into the foramen and pointed ventrally. It was used to remove soft tissue and the deep aspect of the uncus. A nerve hook was then easily passed into the foramen. The same process was repeated for the other foramen. Lateral fluoroscopic imaging was used to guide the insertion of trials into the disc space in a serial fashion, starting with the smallest trial. Once a good fit was obtained, the trial was left in and neuromonitoring signals were checked. There were no changes from baseline. AP and lateral fluoroscopic images were obtained and confirmed satisfactory position of the trial. A 23m was selected for use as the final implant. The allograft was loaded. The allograft interbody implant was then placed into the prepared disc space and lightly malleted into place. Neuromonitoring signals were checked again and no changes from baseline were noted. AP and lateral fluoroscopic images was obtained and confirmed satisfactory position of the final implant.  The microscope was then moved away from the operative field and  the retractor blades were removed from the wound.  Anesthesia staff removed the weight from the  Gardner-Wells tongs and a repeat neuromonitoring check showed no change from baseline. A cloward was then used to retract the esophagus and trachea away from the ventral vertebral bodies and another was used to retract the carotid and sternocleidomastoid laterally. A 22m plate was then selected and placed over the ventral aspects of the vertebral bodies. No soft tissue structures were underneath the plate. An awl  was put into the wound and put into one of the screw holes in the plate. It was aimed slightly medially. A 182mscrew was then inserted into the drilled hole. After placing two screws, the AP view was checked and the plate had rotated. The screws were removed. 4x1380mcrews were then used. The plate was readjusted. An awl was put back into the wound to make in a different hole. A 4x13m9mrew was then inserted. The same steps were repeated with the awl and screw to place the same sized screws in the remaining holes within the plate. Again, the wound was checked and no soft tissue structures were seen underneath the plate.   Final AP and lateral fluoroscopic films were taken and confirmed satisfactory position of the plate and interbody implants. The screws were final tightened. Hemostasis was achieved. A penrose drain was placed into the wound. The platysma was reapproximated using 2-0 vicryl. Neuromonitoring was checked and there were no changes from baseline. Neuromonitoring was discontinued at this time. The deep dermal layer was closed with 2-0 vicryl. The skin was closed with a 3-0 monocryl. All counts were correct at the end of the procedure. Benzoine and steri strips were used over the incision area. The wound was dressed with 4x4 gauze and paper tape. The Gardner-Wells tongs were removed and bacitracin was placed over the skin puncture sites. The patient was awakened from anesthesia and transferred to a bed where a hard cervical collar was applied. The patient was brought back to the  post-anesthesia care unit in stable condition by the anesthesia staff.   Post-operative plan: The patient will recover in the post-anesthesia care unit and then go to the floor. The patient will receive two post-operative doses of ancef. The patient will be out of bed as tolerated in a cervical collar. The patient will work with physical therapy. The drain will be removed on post-operative day one. The patient's disposition will be determined based off how he does on the floor post-operatively.       MichIleene Rubenshopedic Surgeon

## 2022-12-17 ENCOUNTER — Encounter (HOSPITAL_COMMUNITY): Payer: Self-pay | Admitting: Orthopedic Surgery

## 2022-12-17 LAB — CBC
HCT: 32.5 % — ABNORMAL LOW (ref 39.0–52.0)
Hemoglobin: 11 g/dL — ABNORMAL LOW (ref 13.0–17.0)
MCH: 30.7 pg (ref 26.0–34.0)
MCHC: 33.8 g/dL (ref 30.0–36.0)
MCV: 90.8 fL (ref 80.0–100.0)
Platelets: 230 10*3/uL (ref 150–400)
RBC: 3.58 MIL/uL — ABNORMAL LOW (ref 4.22–5.81)
RDW: 15.2 % (ref 11.5–15.5)
WBC: 9.7 10*3/uL (ref 4.0–10.5)
nRBC: 0 % (ref 0.0–0.2)

## 2022-12-17 LAB — BASIC METABOLIC PANEL
Anion gap: 8 (ref 5–15)
BUN: 6 mg/dL — ABNORMAL LOW (ref 8–23)
CO2: 23 mmol/L (ref 22–32)
Calcium: 8.7 mg/dL — ABNORMAL LOW (ref 8.9–10.3)
Chloride: 101 mmol/L (ref 98–111)
Creatinine, Ser: 0.54 mg/dL — ABNORMAL LOW (ref 0.61–1.24)
GFR, Estimated: >60 mL/min (ref >60)
Glucose, Bld: 152 mg/dL — ABNORMAL HIGH (ref 70–99)
Potassium: 3.6 mmol/L (ref 3.5–5.1)
Sodium: 132 mmol/L — ABNORMAL LOW (ref 135–145)

## 2022-12-17 MED ORDER — SODIUM CHLORIDE 0.9 % IV SOLN: INTRAVENOUS | Status: AC

## 2022-12-17 MED ORDER — ENSURE SURGERY PO LIQD
237.0000 mL | Freq: Two times a day (BID) | ORAL | Status: DC
  Administered 2022-12-18 – 2022-12-22 (×6): 237 mL via ORAL
  Filled 2022-12-17 (×12): qty 237

## 2022-12-17 NOTE — Evaluation (Signed)
Physical Therapy Evaluation Patient Details Name: Samuel [name] MRN: [id number] DOB: Apr 01, 1961 Today's Date: 12/17/2022  History of Present Illness  62 yo M admitted to [facility] on 3/8 for cervical myelopathy, s/p C3-4 ACDF. PMH: cocaine and ETOH abuse  Clinical Impression  Pt presents today after C3-4 ACDF, functioning below his mobility baseline. Pt was independent with mobility prior to admission, recently requiring RW and assist with ambulation, reporting several falls. Currently pt with limitations in strength, balance, sensation, and coordination, all impacting mobility. Pt re-educated on cervical spine precautions today as he was unable to repeat them after OT's session earlier today. Pt requiring minA to [place] with RW for transfers and ambulation in the room due to imbalance and ataxic gait. Pt will continue to benefit from skilled acute PT to progress safety with mobility and address deficits, recommend AIR at discharge to maximize independence with mobility prior to returning home. Acute PT will continue to follow as appropriate.      Recommendations for follow up therapy are one component of a multi-disciplinary discharge planning process, led by the attending physician.  Recommendations may be updated based on patient status, additional functional criteria and insurance authorization.  Follow Up Recommendations Acute inpatient rehab (3hours/day)      Assistance Recommended at Discharge Frequent or constant Supervision/Assistance  Patient can return home with the following  A lot of help with walking and/or transfers;Assistance with cooking/housework;Assist for transportation;Help with stairs or ramp for entrance    Equipment Recommendations Other (comment) (defer to next level)  Recommendations for Other Services  Rehab consult    Functional Status Assessment Patient has had a recent decline in their functional status and demonstrates the ability to make significant improvements in  function in a reasonable and predictable amount of time.     Precautions / Restrictions Precautions Precautions: Fall;Cervical Required Braces or Orthoses: Cervical Brace (when ambulating and OOB) Cervical Brace: Hard collar (Aspen collar) Restrictions Weight Bearing Restrictions: No      Mobility  Bed Mobility               General bed mobility comments: pt seated in chair upon arrival, ended session in chair    Transfers Overall transfer level: Needs assistance Equipment used: Rolling walker (2 wheels) Transfers: Sit to/from Stand Sit to Stand: Min assist           General transfer comment: minA for power-up and steady, cueing for proper hand placement and obtaining full upright posture, relying on chair for support in standing    Ambulation/Gait Ambulation/Gait assistance: Mod assist Gait Distance (Feet): 10 Feet (15 feet second trial) Assistive device: Rolling walker (2 wheels) Gait Pattern/deviations: Step-through pattern, Ataxic, Knee flexed in stance - right, Knee flexed in stance - left, Trunk flexed, Drifts right/left Gait velocity: decreased     General Gait Details: very ataxic gait with support needed for balance, pt with crouched gait with B knee and hip flexion with downward gaze, cues provided for upright posture and BLE extension  Stairs            Wheelchair Mobility    Modified Rankin (Stroke Patients Only)       Balance Overall balance assessment: Needs assistance Sitting-balance support: Feet supported, No upper extremity supported Sitting balance-Leahy Scale: Fair Sitting balance - Comments: fair static sitting   Standing balance support: Bilateral upper extremity supported, During functional activity, Reliant on assistive device for balance Standing balance-Leahy Scale: Poor Standing balance comment: reliant on UE support and physical  assist to maintain balance                             Pertinent Vitals/Pain  Pain Assessment Pain Assessment: Faces Faces Pain Scale: Hurts little more Pain Location: neck Pain Descriptors / Indicators: Aching, Discomfort Pain Intervention(s): Monitored during session, Limited activity within patient's tolerance (reports recent Tylenol)    Home Living Family/patient expects to be discharged to:: Private residence Living Arrangements: Alone Available Help at Discharge: Friend(s);Family;Available 24 hours/day Type of Home: Apartment Home Access: Level entry       Home Layout: One level Home Equipment: Rollator (4 wheels);Shower seat Additional Comments: one level apt with no STE, friends and family can be available if needed    Prior Function Prior Level of Function : Needs assist             Mobility Comments: pt was independent with mobility, no AD, ~2-3 months ago, recently requiring assist and use of RW, with several falls over the past few weeks       Hand Dominance   Dominant Hand: Right    Extremity/Trunk Assessment   Upper Extremity Assessment Upper Extremity Assessment: Defer to OT evaluation    Lower Extremity Assessment Lower Extremity Assessment: RLE deficits/detail;LLE deficits/detail RLE Deficits / Details: hip flexion ~4/5, knee extension ~4+/5, knee flexion ~3+/5, ankle DF ~4/5, PF ~4/5. light touch sensation diminished, more intact on R RLE Sensation: decreased light touch RLE Coordination: decreased gross motor LLE Deficits / Details: hip flexion ~3+/5, knee extension ~4+/5, knee flexion ~3+/5, ankle DF ~3+/5, PF ~4/5. light touch sensation diminished on L compared to R LLE Sensation: decreased light touch LLE Coordination: decreased gross motor    Cervical / Trunk Assessment Cervical / Trunk Assessment: Neck Surgery  Communication   Communication: No difficulties  Cognition Arousal/Alertness: Awake/alert Behavior During Therapy: WFL for tasks assessed/performed Overall Cognitive Status: Within Functional Limits for  tasks assessed                                 General Comments: A&Ox4, pleasant throughout, daughter at bedside        General Comments General comments (skin integrity, edema, etc.): daughter present during session    Exercises     Assessment/Plan    PT Assessment Patient needs continued PT services  PT Problem List Decreased strength;Decreased activity tolerance;Decreased balance;Decreased mobility;Decreased coordination;Decreased safety awareness;Decreased knowledge of precautions;Impaired sensation       PT Treatment Interventions DME instruction;Gait training;Functional mobility training;Therapeutic activities;Therapeutic exercise;Balance training;Neuromuscular re-education;Patient/family education    PT Goals (Current goals can be found in the Care Plan section)  Acute Rehab PT Goals Patient Stated Goal: get better and go home PT Goal Formulation: With patient/family Time For Goal Achievement: 12/31/22 Potential to Achieve Goals: Good    Frequency Min 4X/week     Co-evaluation               AM-PAC PT "6 Clicks" Mobility  Outcome Measure Help needed turning from your back to your side while in a flat bed without using bedrails?: A Little Help needed moving from lying on your back to sitting on the side of a flat bed without using bedrails?: A Little Help needed moving to and from a bed to a chair (including a wheelchair)?: A Little Help needed standing up from a chair using your arms (e.g., wheelchair or bedside chair)?:  A Little Help needed to walk in hospital room?: A Lot Help needed climbing 3-5 steps with a railing? : Total 6 Click Score: 15    End of Session Equipment Utilized During Treatment: Gait belt;Cervical collar Activity Tolerance: Patient tolerated treatment well Patient left: in chair;with call bell/phone within reach;with chair alarm set;with family/visitor present Nurse Communication: Mobility status PT Visit Diagnosis:  Repeated falls (R29.6);Other abnormalities of gait and mobility (R26.89);Ataxic gait (R26.0);Muscle weakness (generalized) (M62.81)    Time: [id number] PT Time Calculation (min) (ACUTE ONLY): 19 min   Charges:   PT Evaluation $PT Eval Moderate Complexity: 1 Mod          Charlynne Cousins, PT DPT Acute Rehabilitation Services Office [phone]   Luvenia Heller 12/17/2022, 4:13 PM

## 2022-12-17 NOTE — Anesthesia Postprocedure Evaluation (Signed)
Anesthesia Post Note  Patient: Samuel [name]  Procedure(s) Performed: CERVICAL THREE FOUR ANTERIOR CERVICAL DISCENTOMY AND FUSION (Spine Cervical)     Patient location during evaluation: PACU Anesthesia Type: General Level of consciousness: sedated and patient cooperative Pain management: pain level controlled Vital Signs Assessment: post-procedure vital signs reviewed and stable Respiratory status: spontaneous breathing Cardiovascular status: stable Anesthetic complications: no   There were no known notable events for this encounter.  Last Vitals:  Vitals:   12/16/22 1915 12/16/22 1957  BP: 131/73 135/77  Pulse: 74 (!) 56  Resp: 17 16  Temp:  (!) 36.3 C  SpO2: 94% 100%    Last Pain:  Vitals:   12/16/22 1957  TempSrc: Oral  PainSc:                  [name]

## 2022-12-17 NOTE — Evaluation (Signed)
Occupational Therapy Evaluation Patient Details Name: Samuel [name] MRN: [id number] DOB: Nov 02, 1960 Today's Date: 12/17/2022   History of Present Illness 62 yo s/p C3-4 ACDF secondary to cervical myelopathy. PMH: ETOH abuse   Clinical Impression   At baseline, pt lives alone independently, however for the past 2-3 months he has become weaker, and over the past 2 weeks, he has had a friend [name]) assisting him with all mobility and ADL @ RW level due to below listed deficits. Pt voices frustration at being able to do everything for himself and then suddenly he is depending on others. Pt states he has had numerous falls over the past 2 weeks. Pt is apparently neurapraxic and requires Mod A for mobility and overall Max A with ADL tasks @ RW level. Given his prior level of function, recommend rehab at [facility], however he will most likely need assistance after DC. Pt states his sister can also assist after DC. PT ordered placed. Acute OT to follow to maximize functional level of independence.      Recommendations for follow up therapy are one component of a multi-disciplinary discharge planning process, led by the attending physician.  Recommendations may be updated based on patient status, additional functional criteria and insurance authorization.   Follow Up Recommendations  Acute inpatient rehab (3hours/day)     Assistance Recommended at Discharge Frequent or constant Supervision/Assistance  Patient can return home with the following A lot of help with walking and/or transfers;A lot of help with bathing/dressing/bathroom;Assistance with cooking/housework;Assistance with feeding;Direct supervision/assist for medications management;Assist for transportation;Help with stairs or ramp for entrance    Functional Status Assessment  Patient has had a recent decline in their functional status and demonstrates the ability to make significant improvements in function in a reasonable and predictable amount of  time.  Equipment Recommendations  BSC/3in1    Recommendations for Other Services Rehab consult     Precautions / Restrictions Precautions Precautions: Fall;Cervical Precaution Booklet Issued: Yes (comment) Required Braces or Orthoses: Cervical Brace (when ambulating/OOB per MD note 3/9; no orders) Restrictions Weight Bearing Restrictions: No      Mobility Bed Mobility Overal bed mobility: Needs Assistance Bed Mobility: Supine to Sit     Supine to sit: Min assist, HOB elevated (educated on log rolling technique)          Transfers Overall transfer level: Needs assistance Equipment used: Rolling walker (2 wheels) Transfers: Sit to/from Stand, Bed to chair/wheelchair/BSC Sit to Stand: Min assist     Step pivot transfers: Mod assist     General transfer comment: very ataxic gait; high fall risk      Balance Overall balance assessment: History of Falls, Needs assistance   Sitting balance-Leahy Scale: Fair       Standing balance-Leahy Scale: Poor                             ADL either performed or assessed with clinical judgement   ADL Overall ADL's : Needs assistance/impaired Eating/Feeding: Set up Eating/Feeding Details (indicate cue type and reason): needs tubing and handled cup Grooming: Moderate assistance   Upper Body Bathing: Minimal assistance;Sitting   Lower Body Bathing: Maximal assistance;Sit to/from stand   Upper Body Dressing : Moderate assistance;Cueing for UE precautions   Lower Body Dressing: Maximal assistance;Sit to/from stand   Toilet Transfer: Moderate assistance;BSC/3in1;Rolling walker (2 wheels)   Toileting- Clothing Manipulation and Hygiene: Total assistance Toileting - Clothing Manipulation Details (indicate cue type  and reason): foley (reports increased urinary urgency for the past 2 weeks and is unable to make it to the bathroom so he has been using briefs)     Functional mobility during ADLs: Moderate  assistance;Rolling walker (2 wheels)       Vision Baseline Vision/History: 1 Wears glasses       Perception     Praxis Praxis Praxis tested?: Deficits Deficits: Limb apraxia    Pertinent Vitals/Pain Pain Assessment Pain Assessment: 0-10 Pain Score: 8  Pain Location: neck Pain Descriptors / Indicators: Aching, Grimacing, Discomfort Pain Intervention(s): Limited activity within patient's tolerance, Premedicated before session     Hand Dominance Right   Extremity/Trunk Assessment Upper Extremity Assessment Upper Extremity Assessment: RUE deficits/detail;LUE deficits/detail RUE Deficits / Details: neurapraxic; should/elbow/wrist AROM overall WFL; 3+/5; 3+/5 grip strength; unable to oppose thumb to finger accurately due to apraxia; poor in-hand manipulation skills RUE Sensation: decreased light touch;decreased proprioception RUE Coordination: decreased fine motor;decreased gross motor LUE Deficits / Details: similar to R however overall weaker; shoulder F to @ 90; able to reach behind head; elbow/wrist overall WFL however holds L wirst in flexion overall strength 3/5; grip 3/5; poor in-hand manipulations skills; significant neuropraxia   Lower Extremity Assessment Lower Extremity Assessment: RLE deficits/detail;LLE deficits/detail;Generalized weakness;Defer to PT evaluation RLE Deficits / Details: neurapraxic RLE Sensation: decreased light touch;decreased proprioception RLE Coordination: decreased gross motor LLE Deficits / Details: more imparied than RLE   Cervical / Trunk Assessment Cervical / Trunk Assessment: Neck Surgery   Communication Communication Communication: No difficulties   Cognition Arousal/Alertness: Awake/alert Behavior During Therapy: WFL for tasks assessed/performed Overall Cognitive Status: Within Functional Limits for tasks assessed                                       General Comments  small lesions noted around groin area - nsg  aware    Exercises     Shoulder Instructions      Home Living Family/patient expects to be discharged to:: Private residence Living Arrangements: Alone Available Help at Discharge: Friend(s);Available 24 hours/day Type of Home: Apartment Home Access: Level entry     Home Layout: Two level;Able to live on main level with bedroom/bathroom     Bathroom Shower/Tub: Tub/shower unit;Curtain   Bathroom Toilet: Standard Bathroom Accessibility: No   Home Equipment: Conservation officer, nature (2 wheels);Shower seat          Prior Functioning/Environment Prior Level of Function : Needs assist;Independent/Modified Independent       Physical Assist : Mobility (physical);ADLs (physical) Mobility (physical): Transfers;Gait ADLs (physical): Bathing;Dressing;IADLs;Toileting Mobility Comments: UP until @ 2 weeks ago, Samuel [name] was modified independent with mobility and self care; for the past 2 weeks, he has required min A with mobility and assistance with ADL and IADL tasks; multiple falls          OT Problem List: Decreased strength;Decreased range of motion;Decreased activity tolerance;Impaired balance (sitting and/or standing);Decreased coordination;Decreased knowledge of use of DME or AE;Decreased safety awareness;Decreased knowledge of precautions;Impaired sensation;Impaired tone;Impaired UE functional use;Pain      OT Treatment/Interventions: Self-care/ADL training;Therapeutic exercise;Neuromuscular education;DME and/or AE instruction;Therapeutic activities;Patient/family education;Balance training    OT Goals(Current goals can be found in the care plan section) Acute Rehab OT Goals Patient Stated Goal: to get better OT Goal Formulation: With patient Time For Goal Achievement: 12/31/22 Potential to Achieve Goals: Good  OT Frequency: Min 3X/week    Co-evaluation  AM-PAC OT "6 Clicks" Daily Activity     Outcome Measure Help from another person eating meals?: A  Little Help from another person taking care of personal grooming?: A Lot Help from another person toileting, which includes using toliet, bedpan, or urinal?: Total Help from another person bathing (including washing, rinsing, drying)?: A Lot Help from another person to put on and taking off regular upper body clothing?: A Lot Help from another person to put on and taking off regular lower body clothing?: A Lot 6 Click Score: 12   End of Session Equipment Utilized During Treatment: Gait belt;Rolling walker (2 wheels);Cervical collar Nurse Communication: Mobility status;Precautions  Activity Tolerance:   Patient left: in chair;with call bell/phone within reach;with chair alarm set  OT Visit Diagnosis: Unsteadiness on feet (R26.81);Other abnormalities of gait and mobility (R26.89);Repeated falls (R29.6);Muscle weakness (generalized) (M62.81);Pain Pain - part of body:  (neck)                Time: [id number] OT Time Calculation (min): 31 min Charges:  OT General Charges $OT Visit: 1 Visit OT Evaluation $OT Eval Moderate Complexity: 1 Mod OT Treatments $Self Care/Home Management : 8-22 mins  Maurie Boettcher, OT/L   Acute OT Clinical Specialist [employer] Pager [phone] Office [phone]   [facility] 12/17/2022, 11:57 AM

## 2022-12-17 NOTE — Discharge Instructions (Signed)
Orthopedic Surgery Discharge Instructions  Patient name: Samuel [name] Procedure Performed: C3/4 ACDF Date of Surgery: 12/16/2022 Surgeon: Ileene Rubens, MD  Pre-operative Diagnosis: cervical myelopathy Post-operative Diagnosis: same as above  Discharge Date: 12/22/2022 Discharged to: inpatient rehab Discharge Condition: stable  Activity: You should wear your cervical collar for six weeks after surgery. It is okay to remove the cervical collar when you are taking a shower, but you should have it on at all other times. You should refrain from bending, lifting, or twisting with objects greater than ten pounds until three months after surgery. You are encouraged to walk as much as desired. You can perform household activities such as cleaning dishes, doing laundry, vacuuming, etc. as long as the ten-pound restriction is followed.  Incision Care: Your incision site has a dressing over it. That dressing should remain in place and dry at all times for a total of one week after surgery. After one week, you can remove the dressing. Underneath the dressing, you will find pieces of tape. You should leave these pieces of tape in place. They will fall off with time. Do not pick, rub, or scrub at them. Do not put cream or lotion over the surgical area. After one week and once the dressing is off, it is okay to let soap and water run over your incision. Again, do not pick, scrub, or rub at the pieces of tape when bathing. Do not submerge (e.g., take a bath, swim, go in a hot tub, etc.) until six weeks after surgery. There may be some bloody drainage from the incision into the dressing after surgery. This is normal. You do not need to replace the dressing. Continue to leave it in place for the one week as instructed above. Should the dressing become saturated with blood or drainage, please call the office for further instructions.   Medications: You have been prescribed Norco. This is a narcotic pain medication  and should only be taken as prescribed. You should not drink alcohol or operate heavy machinery (including driving) while taking this medication. The oxycodone can cause constipation as a side effect. For that reason, you have been prescribed senna and miralax. These are both laxatives. You do not need to take this medication if you develop diarrhea. Should you remain constipated even while taking these medications, please increase the dose of miralax to twice daily.  Do not take NSAIDs (ibuprofen, Aleve, Celebrex, naproxen, meloxicam, etc.) for the first 6 weeks after surgery as there is some evidence that their use may decrease the chances of successful fusion.   In order to set expectations for opioid prescriptions, you will only be prescribed opioids for a total of six weeks after surgery and, at two-weeks after surgery, your opioid prescription will start to tapered (decreased dosage and number of pills). If you have ongoing need for opioid medication six weeks after surgery, you will be referred to pain management. If you are already established with a provider that is giving you opioid medications, you should schedule an appointment with them for six weeks after surgery if you feel you are going to need another prescription. State law only allows for opioid prescriptions one week at a time. If you are running out of opioid medication near the end of the week, please call the office during business hours before running out so I can send you another prescription.   You may resume any home blood thinners (aspirin, warfarin, lovenox, apixaban, plavix, xarelto, etc.) 72 hours after your  surgery. Take these medications as they were previously prescribed.   Driving: You should not drive while taking narcotic pain medications. You should also not drive if you feel you cannot turn your body enough to check your blind spots while in your cervical collar. In this case, you should wait six weeks to drive when  your cervical collar will be discontinued. You should start getting back to driving slowly and you may want to try driving in a parking lot before doing anything more. Please be mindful that in some states it is illegal to drive with a cervical collar in place. You should check your local laws before driving and you may have to wait until the collar is discontinued before driving.  Diet: You are safe to resume your regular diet. A common complication after cervical spine surgery is trouble swallowing especially if the incision was made over the front part of your neck. This complication happens often and, the vast majority of the time, it is a temporary problem that gets better with time. The first few days after surgery, you should take more bites than normal to break the food into smaller pieces before swallowing. With time as the swallowing becomes easier, you can gradually get back to taking bites like you normally would.   Reasons to Call the Office After Surgery: You should feel free to call the office with any concerns or questions you have in the post-operative period, but you should definitely notify the office if you develop: -shortness of breath, chest pain, or trouble breathing -excessive bleeding, drainage, redness, or swelling around the surgical site -fevers, chills, or pain that is getting worse with each passing day -persistent nausea or vomiting -new weakness in any extremity, new or worsening numbness or tingling in any extremity -numbness in the groin, bowel or bladder incontinence -other concerns about your surgery  Follow Up Appointments: You should have an office appointment scheduled for approximately two weeks after surgery. If you do not remember when this appointment is or do not already have it scheduled, please call the office to schedule.   Office Information:  -Ileene Rubens, MD -Phone number: [phone] -Address: [address]       [place], [place] [address]

## 2022-12-17 NOTE — Discharge Summary (Incomplete)
Orthopedic Surgery Discharge Summary  Patient name: Samuel [name] Patient MRN: [id number] [place] today: 12/16/2022 Discharge date: 12/22/2022  Attending physician: Ileene Rubens, MD Final diagnosis: cervical myelopathy Findings: Anterior osteophytes at C3/4   Hospital course: Patient is a 62 y.o. male who was admitted after undergoing C3/4 ACDF. The patient had significant pain immediately after surgery, but pain eventually was controlled with a multimodal regimen including Norco. Labs during the hospitalization revealed hyponatremia which was correct with saline infusion. He had post-operative anemia as well but was asymptomatic and did not require transfusion. His anterior neck drain was removed on post-operative day one. The patient worked with physical therapy who recommended discharge to inpatient rehab. The patient was tolerating an oral diet without issue and was voiding spontaneously after surgery. The patient's vitals were stable on the day of discharge.The patient was medically ready for discharge and was discharge to inpatient rehab on post-operative day 6.  Instructions:   Orthopedic Surgery Discharge Instructions   Patient name: Samuel [name] Procedure Performed: C3/4 ACDF Date of Surgery: 12/16/2022 Surgeon: Ileene Rubens, MD   Pre-operative Diagnosis: cervical myelopathy Post-operative Diagnosis: same as above   Discharge Date: 12/22/2022 Discharged to: inpatient rehab Discharge Condition: stable   Activity: You should wear your cervical collar for six weeks after surgery. It is okay to remove the cervical collar when you are taking a shower, but you should have it on at all other times. You should refrain from bending, lifting, or twisting with objects greater than ten pounds until three months after surgery. You are encouraged to walk as much as desired. You can perform household activities such as cleaning dishes, doing laundry, vacuuming, etc. as long as the ten-pound  restriction is followed.   Incision Care: Your incision site has a dressing over it. That dressing should remain in place and dry at all times for a total of one week after surgery. After one week, you can remove the dressing. Underneath the dressing, you will find pieces of tape. You should leave these pieces of tape in place. They will fall off with time. Do not pick, rub, or scrub at them. Do not put cream or lotion over the surgical area. After one week and once the dressing is off, it is okay to let soap and water run over your incision. Again, do not pick, scrub, or rub at the pieces of tape when bathing. Do not submerge (e.g., take a bath, swim, go in a hot tub, etc.) until six weeks after surgery. There may be some bloody drainage from the incision into the dressing after surgery. This is normal. You do not need to replace the dressing. Continue to leave it in place for the one week as instructed above. Should the dressing become saturated with blood or drainage, please call the office for further instructions.    Medications: You have been prescribed Norco. This is a narcotic pain medication and should only be taken as prescribed. You should not drink alcohol or operate heavy machinery (including driving) while taking this medication. The oxycodone can cause constipation as a side effect. For that reason, you have been prescribed senna and miralax. These are both laxatives. You do not need to take this medication if you develop diarrhea. Should you remain constipated even while taking these medications, please increase the dose of miralax to twice daily.   Do not take NSAIDs (ibuprofen, Aleve, Celebrex, naproxen, meloxicam, etc.) for the first 6 weeks after surgery as there is some evidence  that their use may decrease the chances of successful fusion.    In order to set expectations for opioid prescriptions, you will only be prescribed opioids for a total of six weeks after surgery and, at two-weeks  after surgery, your opioid prescription will start to tapered (decreased dosage and number of pills). If you have ongoing need for opioid medication six weeks after surgery, you will be referred to pain management. If you are already established with a provider that is giving you opioid medications, you should schedule an appointment with them for six weeks after surgery if you feel you are going to need another prescription. State law only allows for opioid prescriptions one week at a time. If you are running out of opioid medication near the end of the week, please call the office during business hours before running out so I can send you another prescription.    You may resume any home blood thinners (aspirin, warfarin, lovenox, apixaban, plavix, xarelto, etc.) 72 hours after your surgery. Take these medications as they were previously prescribed.    Driving: You should not drive while taking narcotic pain medications. You should also not drive if you feel you cannot turn your body enough to check your blind spots while in your cervical collar. In this case, you should wait six weeks to drive when your cervical collar will be discontinued. You should start getting back to driving slowly and you may want to try driving in a parking lot before doing anything more. Please be mindful that in some states it is illegal to drive with a cervical collar in place. You should check your local laws before driving and you may have to wait until the collar is discontinued before driving.   Diet: You are safe to resume your regular diet. A common complication after cervical spine surgery is trouble swallowing especially if the incision was made over the front part of your neck. This complication happens often and, the vast majority of the time, it is a temporary problem that gets better with time. The first few days after surgery, you should take more bites than normal to break the food into smaller pieces before  swallowing. With time as the swallowing becomes easier, you can gradually get back to taking bites like you normally would.    Reasons to Call the Office After Surgery: You should feel free to call the office with any concerns or questions you have in the post-operative period, but you should definitely notify the office if you develop: -shortness of breath, chest pain, or trouble breathing -excessive bleeding, drainage, redness, or swelling around the surgical site -fevers, chills, or pain that is getting worse with each passing day -persistent nausea or vomiting -new weakness in any extremity, new or worsening numbness or tingling in any extremity -numbness in the groin, bowel or bladder incontinence -other concerns about your surgery   Follow Up Appointments: You should have an office appointment scheduled for approximately two weeks after surgery. If you do not remember when this appointment is or do not already have it scheduled, please call the office to schedule.    Office Information:  -Ileene Rubens, MD -Phone number: [phone] -Address: [place], [place] [address]   Medications Allergies as of 12/22/2022   No Known Allergies      Medication List     STOP taking these medications  acetaminophen 500 MG tablet Commonly known as: TYLENOL   aspirin 325 MG tablet   gabapentin 300 MG capsule Commonly known as: Neurontin   ibuprofen 200 MG tablet Commonly known as: ADVIL   meloxicam 15 MG tablet Commonly known as: MOBIC   predniSONE 20 MG tablet Commonly known as: DELTASONE       TAKE these medications    enoxaparin 30 MG/0.3ML injection Commonly known as: LOVENOX Inject 0.3 mLs (30 mg total) into the skin daily for 14 days. Start taking on: December 23, 2022   HYDROcodone-acetaminophen 5-325 MG tablet Commonly known as: NORCO/VICODIN Take 1 tablet by mouth every 4 (four) hours as needed for up to 7 days for severe pain or  moderate pain.   senna 8.6 MG Tabs tablet Commonly known as: SENOKOT Take 1 tablet (8.6 mg total) by mouth 2 (two) times daily.       He should use the lovenox until discharge from inpatient rehab

## 2022-12-17 NOTE — Progress Notes (Signed)
Inpatient Rehab Admissions Coordinator Note:   Per therapy patient was screened for CIR candidacy by Aydan Levitz Danford Bad, CCC-SLP. Note pt is under observation status at this time. Pt may not have the medical necessity to warrant an inpatient rehab stay if they remain observation. If status were to change to inpatient, [facility] will screen for candidacy.   Gayland Curry, [place], [place] Admissions Coordinator [phone] 12/17/22 5:17 PM

## 2022-12-17 NOTE — Progress Notes (Signed)
Orthopedic Surgery Post-operative Progress Note  Assessment: Patient is a 62 y.o. male who is currently admitted after undergoing C3/4 ACDF   Plan: -Operative plans complete -Drain removed this morning -Out of bed as tolerated with aspen collar -No bending/lifting/twisting greater than 10 pounds -PT/OT evaluation and treat -Pain control -Regular diet -Ancef  x2 post-operative doses -No antiplatelet or dvt chemoprophylaxis for 72 hours after surgery -Disposition: to floor from PACU  Hyponatremia -Na of 132 this morning -saline infusion today -will recheck tomorrow  Acute anemia from surgical blood loss -Hgb is 11.0 this morning -do not expect it to drift lower, will monitor for symptoms of anemia but no planned further cbc's  _________________________________________________________________________  Subjective: Went from the PACU to the floor yesterday evening. Had soup last night without any dysphagia. No acute events overnight. Was able to get some sleep last night. Eating breakfast this morning and states he is not having a sore throat or trouble swallowing. No radiating arm or leg pain. Denies paresthesias and numbness.   Objective:  General: no acute distress, appropriate affect Neurologic: alert, answering questions appropriately, following commands Respiratory: unlabored breathing on room air Neck: no hematoma appreciated, collar in place Skin: dressing clear/dry/intact, drain under dressings  MSK (spine):   -Strength exam                                                   Left                  Right Grip strength                3/5                  3/5 Interosseus                  3/5                  3/5 Wrist extension            3/5                  4/5 Wrist flexion                 4/5                  4/5 Elbow extension           4/5                  4/5 Elbow flexion                4/5                  4/5 Deltoid                          4/5                   4/5   EHL                              4/5                  4/5 TA  4/5                  4/5 GSC                             4/5                  4/5 Knee extension            4/5                  4/5 Knee flexion                 4/5                  4/5 Hip flexion                    4/5                  4/5   -Sensory exam                           Sensation intact to light touch in L3-S1 nerve distributions of bilateral lower extremities (decreased in all distributions)             Sensation intact to light touch in C5-T1 nerve distributions of bilateral upper extremities (decreased in all distributions)   Patient name: Samuel [name] Patient MRN: [id number] Date: 12/17/22

## 2022-12-17 NOTE — Progress Notes (Signed)
Occupational Therapy Treatment Note  Pt issued AE to help with self feeding and egan hand/intrinsic strengthening HEP. Pt very appreciative. Continue to recommend rehab at [facility].     12/17/22 1600  OT Visit Information  Last OT Received On 12/17/22  History of Present Illness 62 yo M admitted to [facility] on 3/8 for cervical myelopathy, s/p C3-4 ACDF. PMH: cocaine and ETOH abuse  Precautions  Precautions Fall;Cervical  Required Braces or Orthoses Cervical Brace  Cervical Brace Hard collar  Pain Assessment  Pain Assessment Faces  Faces Pain Scale 4  Pain Location neck  Pain Descriptors / Indicators Aching;Discomfort  Pain Intervention(s) Limited activity within patient's tolerance  Cognition  Arousal/Alertness Awake/alert  Behavior During Therapy WFL for tasks assessed/performed  Overall Cognitive Status Within Functional Limits for tasks assessed  General Comments most likely baseline  ADL  General ADL Comments Issued lidded/handled cup to help with self feeding - pt states he srops cups adn sometimes "squeezes/crushes" them; educated on use of red tubing on utensils and toothbrush - tubing helps significantly  Exercises  Exercises Hand exercises;General Upper Extremity;Other exercises  Hand Exercises  Digit Composite Flexion Strengthening;Both;10 reps;Hand exerciser;Squeeze ball  Composite Extension Strengthening;Both;10 reps  Opposition Strengthening;Both;10 reps;Squeeze ball  Other Exercises  Other Exercises given activity to pick up various size containers and work on controlled placement in container  OT - End of Session  Activity Tolerance Patient tolerated treatment well  Patient left in bed;with call bell/phone within reach;with bed alarm set  Nurse Communication Mobility status;Other (comment) (tubing for feeding/ need for set up for feeding)  OT Assessment/Plan  OT Plan Discharge plan remains appropriate  OT Visit Diagnosis Unsteadiness on feet (R26.81);Other abnormalities  of gait and mobility (R26.89);Repeated falls (R29.6);Muscle weakness (generalized) (M62.81);Pain  Pain - part of body  (neck)  OT Frequency (ACUTE ONLY) Min 3X/week  Recommendations for Other Services Rehab consult  Follow Up Recommendations Acute inpatient rehab (3hours/day)  Assistance recommended at discharge Frequent or constant Supervision/Assistance  Patient can return home with the following A lot of help with walking and/or transfers;A lot of help with bathing/dressing/bathroom;Assistance with cooking/housework;Assistance with feeding;Direct supervision/assist for medications management;Assist for transportation;Help with stairs or ramp for entrance  OT Equipment [id number]  AM-PAC OT "6 Clicks" Daily Activity Outcome Measure (Version 2)  Help from another person eating meals? 3  Help from another person taking care of personal grooming? 2  Help from another person toileting, which includes using toliet, bedpan, or urinal? 1  Help from another person bathing (including washing, rinsing, drying)? 2  Help from another person to put on and taking off regular upper body clothing? 2  Help from another person to put on and taking off regular lower body clothing? 2  6 Click Score 12  Progressive Mobility  What is the highest level of mobility based on the progressive mobility assessment? Level 5 (Walks with assist in room/hall) - Balance while stepping forward/back and can walk in room with assist - Complete  Mobility Referral Yes  Activity Moved into chair position in bed  OT Goal Progression  Progress towards OT goals Progressing toward goals  Acute Rehab OT Goals  Patient Stated Goal to get better  OT Goal Formulation With patient  Time For Goal Achievement 12/31/22  Potential to Achieve Goals Good  ADL Goals  Pt Will Perform Eating with set-up;with adaptive utensils  Pt Will Perform Grooming with set-up;sitting;with adaptive equipment  Pt Will Perform Upper Body Bathing with  set-up;sitting  Pt Will  Perform Lower Body Bathing with min assist;sit to/from stand;with adaptive equipment  Pt Will Transfer to Toilet ambulating;with min assist  Pt/caregiver will Perform Home Exercise Program Increased strength;Increased ROM;Both right and left upper extremity;With written HEP provided;With theraputty;Independently  OT Time Calculation  OT Start Time (ACUTE ONLY) 1547  OT Stop Time (ACUTE ONLY) 1602  OT Time Calculation (min) 15 min  OT General Charges  $OT Visit 1 Visit  OT Treatments  $Therapeutic Activity 8-22 mins   Maurie Boettcher, OT/L   Acute OT Clinical Specialist Acute Rehabilitation Services Pager [phone] Office [phone]

## 2022-12-18 DIAGNOSIS — Z5900 Homelessness unspecified: Secondary | ICD-10-CM | POA: Diagnosis not present

## 2022-12-18 DIAGNOSIS — F17210 Nicotine dependence, cigarettes, uncomplicated: Secondary | ICD-10-CM | POA: Diagnosis present

## 2022-12-18 DIAGNOSIS — D62 Acute posthemorrhagic anemia: Secondary | ICD-10-CM | POA: Diagnosis not present

## 2022-12-18 DIAGNOSIS — R2689 Other abnormalities of gait and mobility: Secondary | ICD-10-CM | POA: Diagnosis not present

## 2022-12-18 DIAGNOSIS — M4802 Spinal stenosis, cervical region: Secondary | ICD-10-CM | POA: Diagnosis present

## 2022-12-18 DIAGNOSIS — E46 Unspecified protein-calorie malnutrition: Secondary | ICD-10-CM | POA: Diagnosis present

## 2022-12-18 DIAGNOSIS — G959 Disease of spinal cord, unspecified: Secondary | ICD-10-CM | POA: Diagnosis present

## 2022-12-18 DIAGNOSIS — M2578 Osteophyte, vertebrae: Secondary | ICD-10-CM | POA: Diagnosis present

## 2022-12-18 DIAGNOSIS — G9589 Other specified diseases of spinal cord: Secondary | ICD-10-CM | POA: Diagnosis not present

## 2022-12-18 DIAGNOSIS — R1310 Dysphagia, unspecified: Secondary | ICD-10-CM | POA: Diagnosis not present

## 2022-12-18 DIAGNOSIS — F1010 Alcohol abuse, uncomplicated: Secondary | ICD-10-CM | POA: Diagnosis present

## 2022-12-18 DIAGNOSIS — G992 Myelopathy in diseases classified elsewhere: Secondary | ICD-10-CM | POA: Diagnosis present

## 2022-12-18 DIAGNOSIS — E871 Hypo-osmolality and hyponatremia: Secondary | ICD-10-CM | POA: Diagnosis present

## 2022-12-18 DIAGNOSIS — G4700 Insomnia, unspecified: Secondary | ICD-10-CM | POA: Diagnosis present

## 2022-12-18 DIAGNOSIS — R64 Cachexia: Secondary | ICD-10-CM | POA: Diagnosis present

## 2022-12-18 DIAGNOSIS — E876 Hypokalemia: Secondary | ICD-10-CM | POA: Diagnosis present

## 2022-12-18 DIAGNOSIS — Z681 Body mass index (BMI) 19 or less, adult: Secondary | ICD-10-CM | POA: Diagnosis not present

## 2022-12-18 LAB — CBC
HCT: 31.2 % — ABNORMAL LOW (ref 39.0–52.0)
Hemoglobin: 10.9 g/dL — ABNORMAL LOW (ref 13.0–17.0)
MCH: 31.8 pg (ref 26.0–34.0)
MCHC: 34.9 g/dL (ref 30.0–36.0)
MCV: 91 fL (ref 80.0–100.0)
Platelets: 207 10*3/uL (ref 150–400)
RBC: 3.43 MIL/uL — ABNORMAL LOW (ref 4.22–5.81)
RDW: 15.3 % (ref 11.5–15.5)
WBC: 6.6 10*3/uL (ref 4.0–10.5)
nRBC: 0 % (ref 0.0–0.2)

## 2022-12-18 LAB — BASIC METABOLIC PANEL
Anion gap: 10 (ref 5–15)
BUN: 5 mg/dL — ABNORMAL LOW (ref 8–23)
CO2: 25 mmol/L (ref 22–32)
Calcium: 8.7 mg/dL — ABNORMAL LOW (ref 8.9–10.3)
Chloride: 102 mmol/L (ref 98–111)
Creatinine, Ser: 0.54 mg/dL — ABNORMAL LOW (ref 0.61–1.24)
GFR, Estimated: >60 mL/min (ref >60)
Glucose, Bld: 100 mg/dL — ABNORMAL HIGH (ref 70–99)
Potassium: 3.4 mmol/L — ABNORMAL LOW (ref 3.5–5.1)
Sodium: 137 mmol/L (ref 135–145)

## 2022-12-18 MED ORDER — POTASSIUM CHLORIDE 20 MEQ PO PACK
40.0000 meq | PACK | Freq: Once | ORAL | Status: AC
  Administered 2022-12-18: 40 meq via ORAL
  Filled 2022-12-18: qty 2

## 2022-12-18 NOTE — Progress Notes (Signed)
Orthopedic Surgery Post-operative Progress Note  Assessment: Patient is a 62 y.o. male who is currently admitted after undergoing C3/4 ACDF   Plan: -Operative plans complete -Out of bed as tolerated with aspen collar -No bending/lifting/twisting greater than 10 pounds -PT/OT evaluation and treat -Pain control -Regular diet -Ancef  x2 post-operative doses -No antiplatelet or dvt chemoprophylaxis for 72 hours after surgery -Disposition: remain floor status  Hyponatremia -Na wnl this morning -will stop monitoring  Acute anemia from surgical blood loss -Hgb is 10.9 this morning -do not expect it to drift lower, will monitor for symptoms of anemia but no planned further cbc's  _________________________________________________________________________  Subjective: No acute events overnight. Pain adequately controlled. Feels neck pain has decreased since yesterday. Reports that he felt more stable with the walker than he did prior to surgery. No change in sensation in his arms or legs. Was able to tolerate a diet without pain or difficulty yesterday.   Objective:  General: no acute distress, appropriate affect Neurologic: alert, answering questions appropriately, following commands Respiratory: unlabored breathing on room air Neck: no hematoma appreciated, collar loose (re-adjusted this morning) Skin: dressing clear/dry/intact, drain under dressings  MSK (spine):   -Strength exam                                                   Left                  Right Grip strength                3/5                  3/5 Interosseus                  3/5                  3/5 Wrist extension            3/5                  4/5 Wrist flexion                 4/5                  4/5 Elbow extension           4/5                  4/5 Elbow flexion                4/5                  4/5 Deltoid                          4/5                  4/5   EHL                              4/5                   4/5 TA  4/5                  4/5 GSC                             4/5                  4/5 Knee extension            4/5                  4/5 Knee flexion                 4/5                  4/5 Hip flexion                    4/5                  4/5   -Sensory exam                           Sensation intact to light touch in L3-S1 nerve distributions of bilateral lower extremities (decreased in all distributions)             Sensation intact to light touch in C5-T1 nerve distributions of bilateral upper extremities (decreased in all distributions)   Patient name: Samuel [name] Patient MRN: [id number] Date: 12/18/22

## 2022-12-18 NOTE — Progress Notes (Signed)
Inpatient Rehab Admissions Coordinator Note:   Per therapy patient was screened for [facility] candidacy by Kennedi Lizardo Danford Bad, CCC-SLP. At this time, pt appears to be a potential candidate for [facility]. I will place an order for rehab consult for full assessment, per our protocol.  Please contact me any with questions.Gayland Curry, [place], [place] Admissions Coordinator [phone] 12/18/22 1:56 PM

## 2022-12-19 ENCOUNTER — Encounter (HOSPITAL_COMMUNITY): Payer: Self-pay | Admitting: Orthopedic Surgery

## 2022-12-19 MED ORDER — ACETAMINOPHEN 500 MG PO TABS
1000.0000 mg | ORAL_TABLET | Freq: Three times a day (TID) | ORAL | Status: DC
  Administered 2022-12-19 – 2022-12-20 (×6): 1000 mg via ORAL
  Filled 2022-12-19 (×7): qty 2

## 2022-12-19 MED ORDER — ENOXAPARIN SODIUM 30 MG/0.3ML IJ SOSY
30.0000 mg | PREFILLED_SYRINGE | Freq: Every day | INTRAMUSCULAR | Status: DC
  Administered 2022-12-19 – 2022-12-22 (×4): 30 mg via SUBCUTANEOUS
  Filled 2022-12-19 (×4): qty 0.3

## 2022-12-19 MED ORDER — OXYCODONE HCL 5 MG PO TABS: 5.0000 mg | ORAL_TABLET | ORAL | Status: DC | PRN

## 2022-12-19 NOTE — Progress Notes (Signed)
Occupational Therapy Treatment Patient Details Name: Samuel [name] MRN: [id number] DOB: 1961-08-22 Today's Date: 12/19/2022   History of present illness 62 yo M admitted to [facility] on 3/8 for cervical myelopathy, s/p C3-4 ACDF. PMH: cocaine and ETOH abuse   OT comments  Pt working hard and making good progress with all adl goals. Pt feeding self with increased ease using built up handles and adaptive cup.  Pt does report some significant issues with swalling which were noted when working on feeding.  Feel pt needs to be up in the chair for meals instead of the bed with a pillow under elbow to assist pt with fatigue he feels when feeding self. Pt likes to loosen neck brace significantly but encouraged him (as MD did as well) to keep the brace snug.  Pt able to use urinal for toileting needs with increased ease this am.  Continue to feel pt would be an excellent [facility] candidate to maximize independence with all adls and avoid falls.     Recommendations for follow up therapy are one component of a multi-disciplinary discharge planning process, led by the attending physician.  Recommendations may be updated based on patient status, additional functional criteria and insurance authorization.    Follow Up Recommendations  Acute inpatient rehab (3hours/day)     Assistance Recommended at Discharge Frequent or constant Supervision/Assistance  Patient can return home with the following  A lot of help with walking and/or transfers;A lot of help with bathing/dressing/bathroom;Assistance with cooking/housework;Assistance with feeding;Direct supervision/assist for medications management;Assist for transportation;Help with stairs or ramp for entrance   Equipment Recommendations  BSC/3in1    Recommendations for Other Services      Precautions / Restrictions Precautions Precautions: Fall;Cervical Precaution Booklet Issued: Yes (comment) Precaution Comments: reviewed precautions. Pt with no recall of  precautions. Required Braces or Orthoses: Cervical Brace Cervical Brace: Hard collar Restrictions Weight Bearing Restrictions: No Other Position/Activity Restrictions: Remind pt to wear brace snugly. Pt feels he is having trouble swallowing. Pt coughing a significant amount during breakfast with all foods and liquids.  Pt very "gurgly" with his secretions.  Notified nursing.       Mobility Bed Mobility Overal bed mobility: Needs Assistance Bed Mobility: Supine to Sit     Supine to sit: Min assist, HOB elevated          Transfers Overall transfer level: Needs assistance Equipment used: Rolling walker (2 wheels) Transfers: Sit to/from Stand Sit to Stand: Min assist Stand pivot transfers: Mod assist   Step pivot transfers: Mod assist     General transfer comment: minA for power-up and steady, cueing for proper hand placement and obtaining full upright posture. Cues to tuck bottom underneath him.     Balance Overall balance assessment: Needs assistance Sitting-balance support: Feet supported, No upper extremity supported Sitting balance-Leahy Scale: Good     Standing balance support: Bilateral upper extremity supported, During functional activity, Reliant on assistive device for balance Standing balance-Leahy Scale: Poor Standing balance comment: reliant on UE support and physical assist to maintain balance                           ADL either performed or assessed with clinical judgement   ADL Overall ADL's : Needs assistance/impaired Eating/Feeding: Set up;Sitting Eating/Feeding Details (indicate cue type and reason): Pt doing much better drinking from adaptive cup.  Pt states he still has hard time getting food to his mouth and thinks it is because  of neck brace and that he cannot look down to see his food. Raised table up some for pt to eat while also putting elbows on pillows to attempt to have less fatigue when feeding.  Also helps pt tremendously to be  OOB in the chair to eat. Grooming: Moderate assistance;Sitting Grooming Details (indicate cue type and reason): Pt sat to brush teeth and wash face. Pt required containers to be loosely closed to be able to open them. Pt brushed teeth with set up assist and built up handles on tooth brush.  Pt not able to hold to washcloth and wash top of head due to neuropraxia.  Pt washed face with set up dropping washcloth x2. Pt would benefit from washing mitt.             Lower Body Dressing: Maximal assistance;Sit to/from stand Lower Body Dressing Details (indicate cue type and reason): Pt attempted donning socks from several different positions. Easiest for pt to sit partly sideways on EOB and pull leg up onto bed to support it to attempt to donn socks. Pt continues to require mod assist due to hand weakness and decreased fine motor skills. Toilet Transfer: Moderate assistance;BSC/3in1;Rolling walker (2 wheels) Toilet Transfer Details (indicate cue type and reason): Pt required cues for hand placement on walker.  Pt L hand easily slips off of handle and pt does not realize it.  Pt powered up wtih min assist but mod assist needed to pivot due to posterior lean. Toileting- Clothing Manipulation and Hygiene: Moderate assistance Toileting - Clothing Manipulation Details (indicate cue type and reason): Pt foley now out. pt able to use urinal using BUE. Practiced setting urinal down with two hands vs attaching it to EOB when done so it does not spill. Pt requires min assist at times with this due to decreased fine motor skills.     Functional mobility during ADLs: Moderate assistance;Rolling walker (2 wheels) General ADL Comments: Pt continues to be very limited due to poor coordination, UE strength deficits and neuropraxia.  Pt very unsteady on feet when up.    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; poor in-hand manipulation skills RUE Sensation: decreased light touch;decreased proprioception RUE Coordination: decreased fine motor;decreased gross motor LUE Deficits / Details: similar to R however overall weaker; shoulder F to @ 90; able to reach behind head; elbow/wrist overall WFL however holds L wirst in flexion overall strength 3/5; grip 3/5; poor in-hand manipulations skills; significant neuropraxia LUE Sensation: decreased light touch LUE Coordination: decreased fine motor;decreased gross motor            Vision       Perception     Praxis      Cognition Arousal/Alertness: Awake/alert Behavior During Therapy: WFL for tasks assessed/performed Overall Cognitive Status: Within Functional Limits for tasks assessed                                 General Comments: most likely baseline; does have hard time recalling all precautions.        Exercises Exercises: Hand exercises, General Upper Extremity, Other exercises Other Exercises Other Exercises: Pt used pincer grasp to pick up salt/pepper packs, jelly packs and sauce packs and put them into cup. Pt with greater ease using RUE over  LUE.  Pt also using squeeze foam square. Pt given specific single finger/thumb exercises to do to incresase use of BUEs.    Shoulder Instructions       General Comments Pt unsafe on his feet and with significant impairments in BUE with functional tasks.    Pertinent Vitals/ Pain       Pain Assessment Pain Assessment: Faces Faces Pain Scale: Hurts little more Pain Location: neck Pain Descriptors / Indicators: Aching, Discomfort Pain Intervention(s): Repositioned, Monitored during session  Home Living                                          Prior Functioning/Environment              Frequency  Min 3X/week         Progress Toward Goals  OT Goals(current goals can now be found in the care plan section)  Progress towards OT goals: Progressing toward goals  Acute Rehab OT Goals Patient Stated Goal: to get stronger in my arms OT Goal Formulation: With patient Time For Goal Achievement: 12/31/22 Potential to Achieve Goals: Good ADL Goals Pt Will Perform Eating: with set-up;with adaptive utensils Pt Will Perform Grooming: with set-up;sitting;with adaptive equipment Pt Will Perform Upper Body Bathing: with set-up;sitting Pt Will Perform Lower Body Bathing: with min assist;sit to/from stand;with adaptive equipment Pt Will Transfer to Toilet: ambulating;with min assist Pt/caregiver will Perform Home Exercise Program: Increased strength;Increased ROM;Both right and left upper extremity;With written HEP provided;With theraputty;Independently  Plan Discharge plan remains appropriate    Co-evaluation                 AM-PAC OT "6 Clicks" Daily Activity     Outcome Measure   Help from another person eating meals?: A Little Help from another person taking care of personal grooming?: A Little Help from another person toileting, which includes using toliet, bedpan, or urinal?: A Lot Help from another person bathing (including washing, rinsing, drying)?: A Lot Help from another person to put on and taking off regular upper body clothing?: A Lot Help from another person to put on and taking off regular lower body clothing?: A Lot 6 Click Score: 14    End of Session Equipment Utilized During Treatment: Rolling walker (2 wheels)  OT Visit Diagnosis: Unsteadiness on feet (R26.81);Other abnormalities of gait and mobility (R26.89);Repeated falls (R29.6);Muscle weakness (generalized) (M62.81);Pain   Activity Tolerance Patient tolerated treatment well   Patient Left in chair;with call bell/phone within reach;with chair alarm set   Nurse Communication Mobility status;Other (comment)  (encouraging pt to eat in chair for all meals due to swallowing concerns and helps pt to visually see food better with brace on.)        Time: 0837-0905 OT Time Calculation (min): 28 min  Charges: OT General Charges $OT Visit: 1 Visit OT Treatments $Self Care/Home Management : 23-37 mins    Glenford Peers 12/19/2022, 9:26 AM

## 2022-12-19 NOTE — Progress Notes (Signed)
Physical Therapy Treatment Patient Details Name: Samuel [name] MRN: [id number] DOB: Aug 09, 1961 Today's Date: 12/19/2022   History of Present Illness 62 yo M admitted to [facility] on 3/8 for cervical myelopathy, s/p C3-4 ACDF. PMH: cocaine and ETOH abuse    PT Comments    Pt was received in supine and agreeable to session with encouragement. Pt improved bed mobility and demonstrated log roll technique with cues. Pt demonstrated improved activity tolerance this session with increased gait distance, however continued to demonstrate ataxic gait. Pt requiring cues for pacing, gait kinematics, and RW management for obstacle negotiation. Pt able to march in place, but demonstrating difficulty with hip flexion and leaning to each side slightly to compensate. Pt continues to benefit from PT services to progress toward functional mobility goals.     Recommendations for follow up therapy are one component of a multi-disciplinary discharge planning process, led by the attending physician.  Recommendations may be updated based on patient status, additional functional criteria and insurance authorization.  Follow Up Recommendations  Acute inpatient rehab (3hours/day)     Assistance Recommended at Discharge Frequent or constant Supervision/Assistance  Patient can return home with the following A lot of help with walking and/or transfers;Assistance with cooking/housework;Assist for transportation;Help with stairs or ramp for entrance   Equipment Recommendations  Other (comment) (defer to next level)    Recommendations for Other Services       Precautions / Restrictions Precautions Precautions: Fall;Cervical Precaution Booklet Issued: Yes (comment) Precaution Comments: reviewed precautions. Required Braces or Orthoses: Cervical Brace Cervical Brace: Hard collar Restrictions Weight Bearing Restrictions: No     Mobility  Bed Mobility Overal bed mobility: Needs Assistance Bed Mobility: Supine to  Sit     Supine to sit: Supervision, HOB elevated     General bed mobility comments: Pt required cues for log roll technique, but was able to demonstrate without assist    Transfers Overall transfer level: Needs assistance Equipment used: Rolling walker (2 wheels) Transfers: Sit to/from Stand Sit to Stand: Min guard           General transfer comment: From EOB x1 and from recliner x2 with min guard for safety. Cues for safe hand placement and upright posture    Ambulation/Gait Ambulation/Gait assistance: Min guard Gait Distance (Feet): 90 Feet Assistive device: Rolling walker (2 wheels) Gait Pattern/deviations: Step-through pattern, Ataxic, Knee flexed in stance - right, Knee flexed in stance - left, Trunk flexed, Drifts right/left     Pre-gait activities: Marching in place General Gait Details: Pt demonstrating ataxic gait and unsteady gait requiring min guard for safety, but no physical assist for balance. Cues for upright posture, RW proximity, and BLE extension. Pt requiring one seated recovery break due to fatigue. Pt demonstrating decreased BLE hip flexion resulting in slight hip hiking for foot clearance. Pt also instructed in pacing due to unsteadiness and pushing the RW too far ahead.      Balance Overall balance assessment: Needs assistance Sitting-balance support: Feet supported, No upper extremity supported Sitting balance-Leahy Scale: Good Sitting balance - Comments: sitting EOB and in recliner   Standing balance support: Bilateral upper extremity supported, During functional activity, Reliant on assistive device for balance Standing balance-Leahy Scale: Poor Standing balance comment: with RW support                            Cognition Arousal/Alertness: Awake/alert Behavior During Therapy: WFL for tasks assessed/performed Overall Cognitive Status: Within Functional  Limits for tasks assessed                                  General Comments: Pt required cues for all precautions        Exercises General Exercises - Lower Extremity Hip Flexion/Marching: AROM, Standing, Both, 10 reps    General Comments        Pertinent Vitals/Pain Pain Assessment Pain Assessment: Faces Faces Pain Scale: Hurts little more Pain Location: neck Pain Descriptors / Indicators: Aching, Discomfort Pain Intervention(s): Limited activity within patient's tolerance, Monitored during session, Repositioned      PT Goals (current goals can now be found in the care plan section) Acute Rehab PT Goals Patient Stated Goal: get better and go home PT Goal Formulation: With patient/family Time For Goal Achievement: 12/31/22 Potential to Achieve Goals: Good Progress towards PT goals: Progressing toward goals    Frequency    Min 4X/week      PT Plan Current plan remains appropriate       AM-PAC PT "6 Clicks" Mobility   Outcome Measure  Help needed turning from your back to your side while in a flat bed without using bedrails?: A Little Help needed moving from lying on your back to sitting on the side of a flat bed without using bedrails?: A Little Help needed moving to and from a bed to a chair (including a wheelchair)?: A Little Help needed standing up from a chair using your arms (e.g., wheelchair or bedside chair)?: A Little Help needed to walk in hospital room?: A Little Help needed climbing 3-5 steps with a railing? : Total 6 Click Score: 16    End of Session Equipment Utilized During Treatment: Gait belt;Cervical collar Activity Tolerance: Patient tolerated treatment well Patient left: in chair;with call bell/phone within reach;with chair alarm set Nurse Communication: Mobility status PT Visit Diagnosis: Repeated falls (R29.6);Other abnormalities of gait and mobility (R26.89);Ataxic gait (R26.0);Muscle weakness (generalized) (M62.81)     Time: 1250-1306 PT Time Calculation (min) (ACUTE ONLY): 16  min  Charges:  $Gait Training: 8-22 mins                     Michelle Nasuti, PTA Acute Rehabilitation Services Secure Chat Preferred  [phone]    Michelle Nasuti 12/19/2022, 1:20 PM

## 2022-12-19 NOTE — Evaluation (Signed)
Clinical/Bedside Swallow Evaluation Patient Details  Name: Samuel [name] MRN: [id number] Date of Birth: [birth year]  Today's Date: 12/19/2022 Time: SLP Start Time (ACUTE ONLY): 1425 SLP Stop Time (ACUTE ONLY): 1446 SLP Time Calculation (min) (ACUTE ONLY): 21 min  Past Medical History:  Past Medical History:  Diagnosis Date   Cervical myelopathy ([place])    Elevated LFTs    Pancreatitis    Past Surgical History:  Past Surgical History:  Procedure Laterality Date   ANTERIOR CERVICAL DECOMP/DISCECTOMY FUSION N/A 12/16/2022   Procedure: CERVICAL THREE FOUR ANTERIOR CERVICAL DISCENTOMY AND FUSION;  Surgeon: Callie Fielding, MD;  Location: [facility];  Service: Orthopedics;  Laterality: N/A;   NO PAST SURGERIES     HPI:  62 yo [name] admitted to [facility] on 3/8 for cervical myelopathy, s/p C3-4 ACDF. PMH: cocaine and ETOH abuse    Assessment / Plan / Recommendation  Clinical Impression  Pt reports dysphagia since ACDF and is observed to cough with sips of liquids intermittently. He also has a congested cough at baseline. He has had to expectorate some solids, but also has tolerated even meat at times. Recommend pt continue unrestricted diet but will f/u with MBS tomorrow to determine severity and if any compensations can facilitate swallowing. Pt in agreement. SLP Visit Diagnosis: Dysphagia, oropharyngeal phase (R13.12)    Aspiration Risk  Moderate aspiration risk    Diet Recommendation Regular;Thin liquid   Liquid Administration via: Cup;Straw Medication Administration: Whole meds with liquid Supervision: Patient able to self feed Compensations: Slow rate;Small sips/bites;Follow solids with liquid Postural Changes: Seated upright at 90 degrees    Other  Recommendations      Recommendations for follow up therapy are one component of a multi-disciplinary discharge planning process, led by the attending physician.  Recommendations may be updated based on patient status, additional functional criteria  and insurance authorization.  Follow up Recommendations Acute inpatient rehab (3hours/day)      Assistance Recommended at Discharge    Functional Status Assessment Patient has had a recent decline in their functional status and demonstrates the ability to make significant improvements in function in a reasonable and predictable amount of time.  Frequency and Duration min 2x/week  2 weeks       Prognosis Prognosis for improved oropharyngeal function: Good      Swallow Study   General HPI: 62 yo [name] admitted to [facility] on 3/8 for cervical myelopathy, s/p C3-4 ACDF. PMH: cocaine and ETOH abuse Type of Study: Bedside Swallow Evaluation Diet Prior to this Study: Thin liquids (Level 0);Regular Temperature Spikes Noted: No Behavior/Cognition: Alert;Cooperative;Pleasant mood Patient Positioning: Upright in bed Baseline Vocal Quality: Normal    Oral/Motor/Sensory Function Overall Oral Motor/Sensory Function: Within functional limits   Ice Chips     Thin Liquid Thin Liquid: Impaired Presentation: Cup Pharyngeal  Phase Impairments: Throat Clearing - Immediate;Cough - Immediate    Nectar Thick Nectar Thick Liquid: Not tested   Honey Thick Honey Thick Liquid: Not tested   Puree Puree: Within functional limits   Solid     Solid: Within functional limits      Koa Palla, Katherene Ponto 12/19/2022,2:56 PM

## 2022-12-19 NOTE — Progress Notes (Addendum)
Inpatient Rehab Coordinator Note:  I met with patient at bedside to discuss CIR recommendations and goals/expectations of CIR stay.  We reviewed 3 hrs/day of therapy, physician follow up, and average length of stay 2 weeks (dependent upon progress) with goals of modified independence.  Pt states that his friend, [name], has been staying with him prior to this admission and that he and his sister, [name], have been assisting with ADLs immediately prior to admission.  He gave me permission to speak with his sister to confirm and I will call her today.  I also discussed need for insurance approval and I will start that request today.   1515: attempted to reach sister but no answer and vm full.  Will continue efforts.  Shann Medal, PT, DPT Admissions Coordinator [phone] 12/19/22  12:26 PM

## 2022-12-19 NOTE — Progress Notes (Signed)
Orthopedic Surgery Post-operative Progress Note  Assessment: Patient is a 62 y.o. male who is currently admitted after undergoing C3/4 ACDF   Plan: -Operative plans complete -Out of bed as tolerated with aspen collar -No bending/lifting/twisting greater than 10 pounds -PT/OT evaluation and treat -Pain control -Regular diet -DVT ppx: lovenox starting this evening -Disposition: remain floor status  Hyponatremia -resolved  Acute anemia from surgical blood loss -Hgb is 10.9 this morning -continue to monitor for symptoms of anemia but no planned further cbc's  _________________________________________________________________________  Subjective: No acute events overnight. Neck pain continues to get better. Eating breakfast this morning without issue. His sensation in his arms and legs is unchanged. No radiating pain into his arms or legs.   Objective:  General: no acute distress, appropriate affect Neurologic: alert, answering questions appropriately, following commands Respiratory: unlabored breathing on room air Neck: no hematoma appreciated, collar loose again (re-adjusted this morning) Skin: dressing clear/dry/intact  MSK (spine):   -Strength exam                                                   Left                  Right Grip strength                3/5                  3/5 Interosseus                  3/5                  3/5 Wrist extension            3/5                  4/5 Wrist flexion                 4/5                  4/5 Elbow extension           4/5                  4/5 Elbow flexion                4/5                  4/5 Deltoid                          4/5                  4/5   EHL                              4/5                  4/5 TA                                 4/5                  4/5 [place]  4/5                  4/5 Knee extension            4/5                  4/5 Knee flexion                 4/5                   4/5 Hip flexion                    4/5                  4/5   -Sensory exam                           Sensation intact to light touch in L3-S1 nerve distributions of bilateral lower extremities (decreased in all distributions)             Sensation intact to light touch in C5-T1 nerve distributions of bilateral upper extremities (decreased in all distributions)   Patient name: Samuel [name] Patient MRN: [id number] Date: 12/19/22

## 2022-12-20 ENCOUNTER — Inpatient Hospital Stay (HOSPITAL_COMMUNITY): Payer: Medicaid Other

## 2022-12-20 ENCOUNTER — Encounter (HOSPITAL_COMMUNITY): Payer: Self-pay | Admitting: Orthopedic Surgery

## 2022-12-20 NOTE — Progress Notes (Signed)
Orthopedic Surgery Post-operative Progress Note  Assessment: Patient is a 62 y.o. male who is currently admitted after undergoing C3/4 ACDF   Plan: -Operative plans complete -Out of bed as tolerated with aspen collar -No bending/lifting/twisting greater than 10 pounds -PT/OT evaluation and treat -Pain control -Regular diet -DVT ppx: lovenox -Anticipate discharge to IPR  _________________________________________________________________________  Subjective: No acute events overnight. Neck pain is tolerable at this point. Has had issues with swallowing when he is laying down or reclined. He said he does not have issues when he is upright in the chair and eating. Does not have a sore throat. Sensation in arms and legs has not changed. Feels his balance is better. Walked the halls yesterday evening with a walker and said he felt steadier on his feet.   Objective:  General: no acute distress, appropriate affect Neurologic: alert, answering questions appropriately, following commands Respiratory: unlabored breathing on room air Neck: no hematoma appreciated, collar had been loosened again (re-tightened it this morning) Skin: dressing clear/dry/intact  MSK (spine):   -Strength exam                                                   Left                  Right Grip strength                3/5                  3/5 Interosseus                  3/5                  3/5 Wrist extension            3/5                  4/5 Wrist flexion                 4/5                  4/5 Elbow extension           4/5                  4/5 Elbow flexion                4/5                  4/5 Deltoid                          4/5                  4/5   EHL                              4/5                  4/5 TA                                 4/5  4/5 GSC                             4/5                  4/5 Knee extension            4/5                  4/5 Knee flexion                  4/5                  4/5 Hip flexion                    4/5                  4/5   -Sensory exam                           Sensation intact to light touch in L3-S1 nerve distributions of bilateral lower extremities (decreased in all distributions)             Sensation intact to light touch in C5-T1 nerve distributions of bilateral upper extremities (decreased in all distributions)   Patient name: Samuel [name] Patient MRN: [id number] Date: 12/20/22

## 2022-12-20 NOTE — PMR Pre-admission (Signed)
PMR Admission Coordinator Pre-Admission Assessment  Patient: Samuel [name] is an 62 y.o., male MRN: [id number] DOB: 10-28-1960 Height: '5\' 5"'$  (165.1 cm) Weight: 44 kg  Insurance Information HMO:     PPO:      PCP:      IPA:      80/20:      OTHER:  PRIMARY: U[employer] Medicaid      Policy#: [id number]      Subscriber: pt CM Name: Cacilie Phone#: [phone]     Fax#: [phone] Pre-Cert#: [id number] [place] for CIR provided by Cacilie with [facility] Medicaid with updates due to fax listed above on 3/18      Employer:  Benefits:  Phone #: [phone]     Name:  Eff. Date: 11/10/22     Deduct: $0      Out of Pocket Max: $0      Life Max: n/a CIR: 100%      SNF:  Outpatient:      Co-Pay:  Home Health:       Co-Pay:  DME:      Co-Pay:  Providers:  SECONDARY:       Policy#:      Phone#:   Development worker, community:       Phone#:   The Engineer, [employer]" for patients in Inpatient Rehabilitation Facilities with attached "Privacy Act [place] Records" was provided and verbally reviewed with: N/A  Emergency Contact Information Contact Information     Name Relation Home Work Mobile   [place] Sister   [phone]       Current Medical History  Patient Admitting Diagnosis: cervical myelopathy  History of Present Illness: Pt is a 62 y/o male with PMH of ETOH abuse and pancreatitis with progressive UE weakness, discoordination, and numbness.  Pt had outpatient MRI on 1/30 showing central and bilateral foraminal stenosis at C3-4 with T2 cord signal change, and foraminal stenosis at C4-5 and C7/T1.  XR CT spine on 11/30/22 showed anterior osteophyte at multiple levels with disc height loss at C5/6, C6/7.  He was recommended for operative management and presented to [facility] on 12/16/22 for ACDF of C3-4.  Post op course hyponatremia and ABLA.  Therapy evaluations completed and pt was recommended for CIR.     Patient's medical record from [facility] has been reviewed by the  rehabilitation admission coordinator and physician.  Past Medical History  Past Medical History:  Diagnosis Date   Cervical myelopathy (HCC)    Elevated LFTs    Pancreatitis     Has the patient had major surgery during 100 days prior to admission? Yes  Family History   family history includes Breast cancer in his mother.  Current Medications  Current Facility-Administered Medications:    docusate sodium (COLACE) capsule 100 mg, 100 mg, Oral, BID, Callie Fielding, MD, 100 mg at 12/22/22 0921   enoxaparin (LOVENOX) injection 30 mg, 30 mg, Subcutaneous, Daily, Callie Fielding, MD, 30 mg at 12/22/22 [id number]   feeding supplement (ENSURE SURGERY) liquid 237 mL, 237 mL, Oral, BID BM, Callie Fielding, MD, 237 mL at 12/22/22 [id number]   HYDROcodone-acetaminophen (NORCO/VICODIN) 5-325 MG per tablet 1 tablet, 1 tablet, Oral, Q4H PRN, Callie Fielding, MD, 1 tablet at 12/22/22 0929   ondansetron (ZOFRAN) tablet 4 mg, 4 mg, Oral, Q6H PRN **OR** ondansetron (ZOFRAN) injection 4 mg, 4 mg, Intravenous, Q6H PRN, Callie Fielding, MD  Patients Current Diet:  Diet Order  Diet regular Room service appropriate? Yes; Fluid consistency: Thin  Diet effective now                   Precautions / Restrictions Precautions Precautions: Fall, Cervical Precaution Booklet Issued: Yes (comment) Precaution Comments: reviewed precautions. Cervical Brace: Hard collar Restrictions Weight Bearing Restrictions: No Other Position/Activity Restrictions: Continue to reposition brace as pt likes to wear it loose.   Has the patient had 2 or more falls or a fall with injury in the past year? Yes  Prior Activity Level Community (5-7x/wk): Slow decline over the last year, but only requiring assist the last 2 weeks for ADLs, using a rollator most recently prior to admit, does not have a car, does not work  Prior Functional Level Self Care: Did the patient need help bathing, dressing, using the toilet or  eating? Independent  Indoor Mobility: Did the patient need assistance with walking from room to room (with or without device)? Independent  Stairs: Did the patient need assistance with internal or external stairs (with or without device)? Independent  Functional Cognition: Did the patient need help planning regular tasks such as shopping or remembering to take medications? Independent  Patient Information Are you of Hispanic, Latino/a,or Spanish origin?: A. No, not of Hispanic, Latino/a, or Spanish origin What is your race?: B. Black or African American Do you need or want an interpreter to communicate with a doctor or health care staff?: 0. No  Patient's Response To:  Health Literacy and Transportation Is the patient able to respond to health literacy and transportation needs?: Yes Health Literacy - How often do you need to have someone help you when you read instructions, pamphlets, or other written material from your doctor or pharmacy?: Never In the past 12 months, has lack of transportation kept you from medical appointments or from getting medications?: No In the past 12 months, has lack of transportation kept you from meetings, work, or from getting things needed for daily living?: No  Home Assistive Devices / Equipment Home Equipment: Rollator (4 wheels), Shower seat  Prior Device Use: Indicate devices/aids used by the patient prior to current illness, exacerbation or injury? Walker  Current Functional Level Cognition  Overall Cognitive Status: Within Functional Limits for tasks assessed Orientation Level: Oriented X4 General Comments: Pt can state precautions but does not always follow them.  Continues to rotate neck and loosen brace.    Extremity Assessment (includes Sensation/Coordination)  Upper Extremity Assessment: LUE deficits/detail, RUE deficits/detail RUE Deficits / Details: Pt gripping objects easier and using RUE more for functional tasks. RUE Sensation:  decreased light touch, decreased proprioception RUE Coordination: decreased fine motor, decreased gross motor LUE Deficits / Details: Pt with in creased use of LUE during functional tasks. LUE Sensation: decreased light touch LUE Coordination: decreased fine motor, decreased gross motor  Lower Extremity Assessment: Defer to PT evaluation RLE Deficits / Details: hip flexion ~4/5, knee extension ~4+/5, knee flexion ~3+/5, ankle DF ~4/5, PF ~4/5. light touch sensation diminished, more intact on R RLE Sensation: decreased light touch RLE Coordination: decreased gross motor LLE Deficits / Details: hip flexion ~3+/5, knee extension ~4+/5, knee flexion ~3+/5, ankle DF ~3+/5, PF ~4/5. light touch sensation diminished on L compared to R LLE Sensation: decreased light touch LLE Coordination: decreased gross motor    ADLs  Overall ADL's : Needs assistance/impaired Eating/Feeding: Set up, Sitting Eating/Feeding Details (indicate cue type and reason): Pt doing much better drinking from adaptive cup.  Pt states he still  has hard time getting food to his mouth and thinks it is because of neck brace and that he cannot look down to see his food. Raised table up some for pt to eat while also putting elbows on pillows to attempt to have less fatigue when feeding.  Also helps pt tremendously to be OOB in the chair to eat. Grooming: Wash/dry hands, Min guard Grooming Details (indicate cue type and reason): Pt stood to brush teeth and wash hands without LOB at sink after toileting. Upper Body Bathing: Minimal assistance, Sitting Lower Body Bathing: Maximal assistance, Sit to/from stand Upper Body Dressing : Moderate assistance, Cueing for UE precautions Lower Body Dressing: Minimal assistance, Sit to/from stand, Cueing for compensatory techniques Lower Body Dressing Details (indicate cue type and reason): Cues to dress weaker leg first. Pt donned boxers with min guard to stand and socks with min assist. Toilet  Transfer: Minimal assistance, Ambulation, Grab bars, Comfort height toilet Toilet Transfer Details (indicate cue type and reason): Pt walked to bathroom with walker. Pt with no LOB. Cues for hand placement.  Pt then stood to urinate at toilet and needed to let go of grab bar with both hands. Pt stood for appx 2 minutes while having mild tremor while standing.  Pt required min assist to stablize self to urinate while not holding to anything.  Spoke with pt about trying to hold one hand to self and one hand on grab rail. Toileting- Clothing Manipulation and Hygiene: Minimal assistance, Sit to/from stand, Cueing for compensatory techniques Toileting - Clothing Manipulation Details (indicate cue type and reason): min assist to maintain balance when holding to nothing. Functional mobility during ADLs: Minimal assistance, Rolling walker (2 wheels) General ADL Comments: Pt making improvements in all areas. Pt continues to be ataxic with gait when up but fewer LOB.  Pt using BUE more for all adls.    Mobility  Overal bed mobility: Needs Assistance Bed Mobility: Supine to Sit, Sit to Supine Supine to sit: Supervision, HOB elevated Sit to supine: Supervision, HOB elevated General bed mobility comments: Pt demonstrating log roll technique without cues. Use of bedrails and HOB elevated    Transfers  Overall transfer level: Needs assistance Equipment used: Rolling walker (2 wheels) Transfers: Sit to/from Stand Sit to Stand: Min guard Bed to/from chair/wheelchair/BSC transfer type:: Stand pivot Stand pivot transfers: Min guard Step pivot transfers: Min guard General transfer comment: From EOB to RW with min guard for safety. Pt demonstrating safe hand placement. Cues for upright posture    Ambulation / Gait / Stairs / Wheelchair Mobility  Ambulation/Gait Ambulation/Gait assistance: Counsellor (Feet): 190 Feet Assistive device: Rolling walker (2 wheels) Gait Pattern/deviations:  Step-through pattern, Ataxic, Knee flexed in stance - right, Knee flexed in stance - left, Trunk flexed General Gait Details: Pt slightly improving stability this session requiring min guard for safety. Cues for upright posture and RW proximity. pt demonstrating decreased hip flexion during gait trial, however is able to flex hip when marching in place Gait velocity: decreased Pre-gait activities: Marching in place with cues for technique    Posture / Balance Dynamic Sitting Balance Sitting balance - Comments: sitting EOB Balance Overall balance assessment: Needs assistance Sitting-balance support: Feet supported, No upper extremity supported Sitting balance-Leahy Scale: Good Sitting balance - Comments: sitting EOB Standing balance support: Bilateral upper extremity supported, During functional activity, Reliant on assistive device for balance Standing balance-Leahy Scale: Poor Standing balance comment: with RW support    Special needs/care consideration  Skin surgical incision to neck   Previous Home Environment (from acute therapy documentation) Living Arrangements: Alone Available Help at Discharge: Friend(s), Family, Available 24 hours/day Type of Home: Apartment Home Layout: One level Home Access: Level entry Bathroom Shower/Tub: Chiropodist: Standard Bathroom Accessibility: No Additional Comments: one level apt with no STE, friends and family can be available if needed  Discharge Living Setting Plans for Discharge Living Setting: Patient's home, Lives with (comment) (friend, [name], stays with him) Type of Home at Discharge: Apartment Discharge Home Layout: One level Discharge Home Access: Level entry Discharge Bathroom Shower/Tub: Tub/shower unit Discharge Bathroom Toilet: Standard Discharge Bathroom Accessibility: No Does the patient have any problems obtaining your medications?: No  Social/Family/Support Systems Anticipated Caregiver: mod I goals,  contact sister [name] for updates Anticipated Caregiver's Contact Information: [phone] Ability/Limitations of Caregiver: works, not available 24/7 Caregiver Availability: Intermittent Discharge Plan Discussed with Primary Caregiver: Yes Is Caregiver In Agreement with Plan?: Yes Does Caregiver/Family have Issues with Lodging/Transportation while Pt is in Rehab?: No  Goals Patient/Family Goal for Rehab: PT/OT mod I, SLP n/a Expected length of stay: 7-10 days Additional Information: Discharge plan: back to pt's apartmet at mod I level Pt/Family Agrees to Admission and willing to participate: Yes Program Orientation Provided & Reviewed with Pt/Caregiver Including Roles  & Responsibilities: Yes  Barriers to Discharge: Insurance for SNF coverage, Decreased caregiver support  Decrease burden of Care through IP rehab admission: n/a  Possible need for SNF placement upon discharge: Not anticipated.  Pt with mod I goals, plan to discharge to his apartment.   Patient Condition: I have reviewed medical records from [facility], spoken with CM, and patient and family member. I met with patient at the bedside and discussed via phone for inpatient rehabilitation assessment.  Patient will benefit from ongoing PT and OT, can actively participate in 3 hours of therapy a day 5 days of the week, and can make measurable gains during the admission.  Patient will also benefit from the coordinated team approach during an Inpatient Acute Rehabilitation admission.  The patient will receive intensive therapy as well as Rehabilitation physician, nursing, social worker, and care management interventions.  Due to safety, skin/wound care, medication administration, pain management, and patient education the patient requires 24 hour a day rehabilitation nursing.  The patient is currently min assist with mobility and basic ADLs.  Discharge setting and therapy post discharge at home with home health is anticipated.  Patient  has agreed to participate in the Acute Inpatient Rehabilitation Program and will admit today.  Preadmission Screen Completed By:  Michel Santee, PT, DPT 12/22/2022 10:10 AM ______________________________________________________________________   Discussed status with Dr. Dagoberto Ligas on 12/22/22  at 10:10 AM and received approval for admission today.  Admission Coordinator:  Michel Santee, PT, DPT time  10:10 AM Sudie Grumbling 12/22/22    Assessment/Plan: Diagnosis: Does the need for close, 24 hr/day Medical supervision in concert with the patient's rehab needs make it unreasonable for this patient to be served in a less intensive setting? Yes Co-Morbidities requiring supervision/potential complications: Cervical myelopathy with UE/LE weakness; hyponatremia; dysphagia- refusing dysphagia diet- s/p C3/4 ACDF Due to bladder management, bowel management, safety, skin/wound care, disease management, medication administration, pain management, and patient education, does the patient require 24 hr/day rehab nursing? Yes Does the patient require coordinated care of a physician, rehab nurse, PT, OT, and SLP to address physical and functional deficits in the context of the above medical diagnosis(es)? Yes Addressing deficits in  the following areas: balance, endurance, locomotion, strength, transferring, bowel/bladder control, bathing, dressing, feeding, grooming, and toileting Can the patient actively participate in an intensive therapy program of at least 3 hrs of therapy 5 days a week? Yes The potential for patient to make measurable gains while on inpatient rehab is good Anticipated functional outcomes upon discharge from inpatient rehab: modified independent PT, modified independent OT, n/a SLP Estimated rehab length of stay to reach the above functional goals is: 7-10 days Anticipated discharge destination: Home 10. Overall Rehab/Functional Prognosis: good   MD Signature:

## 2022-12-20 NOTE — Progress Notes (Addendum)
Inpatient Rehab Admissions Coordinator:   I did receive insurance approval for CIR, but I do not have a bed available for this patient to admit today. I left a VM with sister to discuss and will update pt at bedside.   1300: Updated sister via phone.  She confirms pt's friend stays with him PTA and will stay short term after discharge, but we do expect mod I goals for CIR stay.    Shann Medal, PT, DPT Admissions Coordinator [phone] 12/20/22  11:59 AM

## 2022-12-20 NOTE — Progress Notes (Signed)
Modified Barium Swallow Study  Patient Details  Name: Samuel [name] MRN: [id number] Date of Birth: Oct 20, 1960  Today's Date: 12/20/2022  Modified Barium Swallow completed.  Full report located under Chart Review in the Imaging Section.  History of Present Illness 62 yo M admitted to [facility] on 3/8 for cervical myelopathy, s/p C3-4 ACDF. PMH: cocaine and ETOH abuse   Clinical Impression Pt demonstrates an acute dysphagia following ACDF with observable edema of posterior pharyngeal wall, restricting epiglottic inversion. There is decreased bolus propulsion through the pharynx and decreased UES opening with accumulation of residue in the vallecular space and in the interarytenoid space and vestibule that is aspirated during and after swallow attempts. Pt has diminishing sensation of aspiration and cough is not always effective to clear thin liquids from airway (PAS 3-8). More cohesive nectar thick liquids are not aspirated during the study and are most mobile to reduce severity of residue. Pt able to clear approximately 50% puree/pudding thick textures with a liquid wash and multiple effortful swallows. Pt had significant premature spillage of solids with immediate silent penetration to the vocal folds. There is also mild backflow of PO from the UES to the pyriforms due to appearance of bony protrusion at C6/7. Pt in a hard collar but a slight chin tuck did seem to aid clearance of vallecular residue; if pt given clearance to removal collar and mobilize neck this may be a very helpful strategy in the future. At this time recommend a full nectar thick liquid diet though could also tolerate purees if desired. Future session to work on use of strategies, pt awareness, and benefit of oral hygiene. Dysphagia should improve as edema decreases. Factors that may increase risk of adverse event in presence of aspiration Phineas Douglas & Padilla 2021):    Swallow Evaluation Recommendations Recommendations: PO diet PO  Diet Recommendation: Full liquid diet;Mildly thick liquids (Level 2, nectar thick) Liquid Administration via: Cup;Straw Medication Administration: Crushed with puree Swallowing strategies  : Slow rate;Small bites/sips;Follow solids with liquids;Clear throat intermittently;Hard cough after swallowing;Multiple dry swallows after each bite/sip;effortful swallow Postural changes: Position pt fully upright for meals;Stay upright 30-60 min after meals      Rorey Hodges, Katherene Ponto 12/20/2022,11:58 AM

## 2022-12-20 NOTE — Progress Notes (Signed)
Physical Therapy Treatment Patient Details Name: Samuel [name] MRN: [id number] DOB: August 11, 1961 Today's Date: 12/20/2022   History of Present Illness 62 yo M admitted to [facility] on 3/8 for cervical myelopathy, s/p C3-4 ACDF. PMH: cocaine and ETOH abuse    PT Comments    Pt was received in supine and agreeable to session. Session focused on standing balance and increasing activity tolerance. Pt was able to recall precautions with minimal cues this session. Pt requested to brush his teeth at the beginning of the session. Pt was able to stand at the sink briefly with no UE support and trunk leaned against counter, however LLE slid backwards requiring min A to maintain balance. Pt then utilized single UE support on counter with min guard for safety. Pt able to increase gait distance this session with cues for upright posture and RW management throughout. Pt continued to demonstrate an ataxic gait pattern and decreased safety awareness. Pt continues to benefit from PT services to progress toward functional mobility goals.     Recommendations for follow up therapy are one component of a multi-disciplinary discharge planning process, led by the attending physician.  Recommendations may be updated based on patient status, additional functional criteria and insurance authorization.  Follow Up Recommendations  Acute inpatient rehab (3hours/day)     Assistance Recommended at Discharge Frequent or constant Supervision/Assistance  Patient can return home with the following A lot of help with walking and/or transfers;Assistance with cooking/housework;Assist for transportation;Help with stairs or ramp for entrance   Equipment Recommendations  Other (comment)    Recommendations for Other Services       Precautions / Restrictions Precautions Precautions: Fall;Cervical Precaution Booklet Issued: Yes (comment) Precaution Comments: reviewed precautions. Required Braces or Orthoses: Cervical Brace Cervical  Brace: Hard collar Restrictions Weight Bearing Restrictions: No Other Position/Activity Restrictions: Remind pt to wear brace snugly. Pt feels he is having trouble swallowing. Pt coughing a significant amount during breakfast with all foods and liquids.  Pt very "gurgly" with his secretions.  Notified nursing.     Mobility  Bed Mobility Overal bed mobility: Needs Assistance Bed Mobility: Supine to Sit     Supine to sit: Supervision, HOB elevated     General bed mobility comments: Pt demonstrating log roll technique without cues. Use of bedrails and HOB elevated    Transfers Overall transfer level: Needs assistance Equipment used: Rolling walker (2 wheels) Transfers: Sit to/from Stand Sit to Stand: Min guard           General transfer comment: From EOB x1 with min guard for safety. Cues for safe hand placement and upright posture.    Ambulation/Gait Ambulation/Gait assistance: Min guard Gait Distance (Feet): 100 Feet Assistive device: Rolling walker (2 wheels) Gait Pattern/deviations: Step-through pattern, Ataxic, Knee flexed in stance - right, Knee flexed in stance - left, Trunk flexed Gait velocity: decreased     General Gait Details: Pt demonstrating ataxic gait pattern with slightly improved stability with RW support. Cues for upright posture and RW proximity throughout       Balance Overall balance assessment: Needs assistance Sitting-balance support: Feet supported, No upper extremity supported Sitting balance-Leahy Scale: Good Sitting balance - Comments: sitting EOB   Standing balance support: Bilateral upper extremity supported, During functional activity, Reliant on assistive device for balance Standing balance-Leahy Scale: Poor Standing balance comment: with RW support. Pt able to stand at sink with no UE support intermittently with trunk leaned against counter for balance. Pt requiring intermittent min A to maintain  balance.                             Cognition Arousal/Alertness: Awake/alert Behavior During Therapy: WFL for tasks assessed/performed Overall Cognitive Status: Within Functional Limits for tasks assessed                                 General Comments: Pt able to recall precautions with min cues        Exercises      General Comments General comments (skin integrity, edema, etc.): Pt with decreased safety awareness, reporting going to the bathroom independently.      Pertinent Vitals/Pain Pain Assessment Pain Assessment: Faces Faces Pain Scale: Hurts a little bit Pain Location: neck Pain Descriptors / Indicators: Aching, Discomfort Pain Intervention(s): Limited activity within patient's tolerance, Monitored during session, Repositioned     PT Goals (current goals can now be found in the care plan section) Acute Rehab PT Goals Patient Stated Goal: get better and go home PT Goal Formulation: With patient/family Time For Goal Achievement: 12/31/22 Potential to Achieve Goals: Good Progress towards PT goals: Progressing toward goals    Frequency    Min 4X/week      PT Plan Current plan remains appropriate       AM-PAC PT "6 Clicks" Mobility   Outcome Measure  Help needed turning from your back to your side while in a flat bed without using bedrails?: A Little Help needed moving from lying on your back to sitting on the side of a flat bed without using bedrails?: A Little Help needed moving to and from a bed to a chair (including a wheelchair)?: A Little Help needed standing up from a chair using your arms (e.g., wheelchair or bedside chair)?: A Little Help needed to walk in hospital room?: A Little Help needed climbing 3-5 steps with a railing? : Total 6 Click Score: 16    End of Session Equipment Utilized During Treatment: Gait belt;Cervical collar Activity Tolerance: Patient tolerated treatment well Patient left: in bed;with bed alarm set;with call bell/phone within  reach Nurse Communication: Mobility status PT Visit Diagnosis: Repeated falls (R29.6);Other abnormalities of gait and mobility (R26.89);Ataxic gait (R26.0);Muscle weakness (generalized) (M62.81)     Time: [id number] PT Time Calculation (min) (ACUTE ONLY): 18 min  Charges:  $Gait Training: 8-22 mins                     Michelle Nasuti, PTA Acute Rehabilitation Services Secure Chat Preferred  [phone]    Michelle Nasuti 12/20/2022, 11:02 AM

## 2022-12-21 MED ORDER — HYDROCODONE-ACETAMINOPHEN 5-325 MG PO TABS
1.0000 | ORAL_TABLET | ORAL | Status: DC | PRN
  Administered 2022-12-22: 1 via ORAL
  Filled 2022-12-21: qty 1

## 2022-12-21 NOTE — Progress Notes (Signed)
Occupational Therapy Treatment Patient Details Name: Samuel [name] MRN: [id number] DOB: 11-01-1960 Today's Date: 12/21/2022   History of present illness 62 yo M admitted to [facility] on 3/8 for cervical myelopathy, s/p C3-4 ACDF. PMH: cocaine and ETOH abuse   OT comments  Pt making good progress with all adls. Pt overall min assist with most adls. Concerns lie in areas of safety awareness and following through with precautions. Pt does not like neck brace and moves neck a fair amount in brace when he loosens it.  Instructed pt to be safe with neck to allow healing. Pt states he gets up and walks to bathroom for toileting on his own when nurses don't come. Feel pt is a fall risk.  Feel pt's lifestyle will lead him to be a risk taker when at home as well. Focusing on safety and good choices when in treatments.  Pt would like to go outside with his sister if able at some point. Will let nursing know.   Recommendations for follow up therapy are one component of a multi-disciplinary discharge planning process, led by the attending physician.  Recommendations may be updated based on patient status, additional functional criteria and insurance authorization.    Follow Up Recommendations  Acute inpatient rehab (3hours/day)     Assistance Recommended at Discharge Frequent or constant Supervision/Assistance  Patient can return home with the following  A little help with walking and/or transfers;A little help with bathing/dressing/bathroom;Assistance with cooking/housework;Assist for transportation;Help with stairs or ramp for entrance   Equipment Recommendations  BSC/3in1    Recommendations for Other Services Rehab consult    Precautions / Restrictions Precautions Precautions: Fall;Cervical Precaution Comments: reviewed precautions. Required Braces or Orthoses: Cervical Brace Cervical Brace: Hard collar Restrictions Weight Bearing Restrictions: No Other Position/Activity Restrictions: Continue to  reposition brace as pt likes to wear it loose.       Mobility Bed Mobility Overal bed mobility: Needs Assistance Bed Mobility: Supine to Sit     Supine to sit: Supervision, HOB elevated     General bed mobility comments: Pt demonstrating log roll technique without cues. Use of bedrails and HOB elevated    Transfers Overall transfer level: Needs assistance Equipment used: Rolling walker (2 wheels) Transfers: Sit to/from Stand Sit to Stand: Min guard Stand pivot transfers: Min guard   Step pivot transfers: Min guard     General transfer comment: From EOB x1 with min guard for safety. Cues for safe hand placement and upright posture.     Balance Overall balance assessment: Needs assistance Sitting-balance support: Feet supported, No upper extremity supported Sitting balance-Leahy Scale: Good Sitting balance - Comments: sitting EOB   Standing balance support: Bilateral upper extremity supported, During functional activity, Reliant on assistive device for balance Standing balance-Leahy Scale: Poor Standing balance comment: Pt continues to need walker when up but is requring less assist while using walker.                           ADL either performed or assessed with clinical judgement   ADL Overall ADL's : Needs assistance/impaired     Grooming: Wash/dry hands;Wash/dry face;Oral care;Min guard;Standing Grooming Details (indicate cue type and reason): Pt stood to brush teeth and wash hands without LOB at sink after toileting.             Lower Body Dressing: Minimal assistance;Sit to/from stand;Cueing for compensatory techniques Lower Body Dressing Details (indicate cue type and reason): Cues to  dress weaker leg first. Pt donned boxers with min guard to stand and socks with min assist. Toilet Transfer: Minimal assistance;Ambulation;Grab bars;Comfort height toilet Toilet Transfer Details (indicate cue type and reason): Pt walked to bathroom with walker. Pt  with no LOB. Cues for hand placement. Toileting- Clothing Manipulation and Hygiene: Minimal assistance;Sit to/from stand;Cueing for compensatory techniques       Functional mobility during ADLs: Minimal assistance;Rolling walker (2 wheels) General ADL Comments: Pt making improvements in all areas. Pt continues to be ataxic with gait when up but fewer LOB.  Pt using BUE more for all adls.    Extremity/Trunk Assessment Upper Extremity Assessment Upper Extremity Assessment: LUE deficits/detail;RUE deficits/detail RUE Deficits / Details: Pt gripping objects easier and using RUE more for functional tasks. LUE Deficits / Details: Pt with in creased use of LUE during functional tasks.   Lower Extremity Assessment Lower Extremity Assessment: Defer to PT evaluation        Vision   Additional Comments: wears glasses during session.   Perception     Praxis      Cognition Arousal/Alertness: Awake/alert Behavior During Therapy: WFL for tasks assessed/performed Overall Cognitive Status: Within Functional Limits for tasks assessed                                 General Comments: Pt can state precautions but does not always follow them.  Continues to rotate neck and loosen brace.        Exercises      Shoulder Instructions       General Comments Pt continues to say he his sleep deprived and does not like to get up but is always willing to work once encouraged.    Pertinent Vitals/ Pain       Pain Assessment Pain Assessment: No/denies pain  Home Living                                          Prior Functioning/Environment              Frequency  Min 3X/week        Progress Toward Goals  OT Goals(current goals can now be found in the care plan section)  Progress towards OT goals: Progressing toward goals  Acute Rehab OT Goals Patient Stated Goal: to go home OT Goal Formulation: With patient Time For Goal Achievement:  12/31/22 Potential to Achieve Goals: Good ADL Goals Pt Will Perform Eating: with set-up;with adaptive utensils Pt Will Perform Grooming: with set-up;sitting;with adaptive equipment Pt Will Perform Upper Body Bathing: with set-up;sitting Pt Will Perform Lower Body Bathing: with min assist;sit to/from stand;with adaptive equipment Pt Will Transfer to Toilet: ambulating;with min assist Pt/caregiver will Perform Home Exercise Program: Increased strength;Increased ROM;Both right and left upper extremity;With written HEP provided;With theraputty;Independently  Plan Discharge plan remains appropriate    Co-evaluation                 AM-PAC OT "6 Clicks" Daily Activity     Outcome Measure   Help from another person eating meals?: A Little Help from another person taking care of personal grooming?: A Little Help from another person toileting, which includes using toliet, bedpan, or urinal?: A Little Help from another person bathing (including washing, rinsing, drying)?: A Little Help from another person to put on and  taking off regular upper body clothing?: A Little Help from another person to put on and taking off regular lower body clothing?: A Little 6 Click Score: 18    End of Session Equipment Utilized During Treatment: Rolling walker (2 wheels);Cervical collar  OT Visit Diagnosis: Unsteadiness on feet (R26.81);Other abnormalities of gait and mobility (R26.89);Repeated falls (R29.6);Muscle weakness (generalized) (M62.81);Pain   Activity Tolerance Patient tolerated treatment well   Patient Left in bed;with bed alarm set;with call bell/phone within reach   Nurse Communication Mobility status;Other (comment) (pt would like to go outside with sister later today; would need to speak to sister to make sure pt is safe outside, not smoking etc.)        Time: [id number] OT Time Calculation (min): 23 min  Charges: OT General Charges $OT Visit: 1 Visit OT Treatments $Self  Care/Home Management : 23-37 mins   Glenford Peers 12/21/2022, 11:01 AM

## 2022-12-21 NOTE — Progress Notes (Signed)
Physical Therapy Treatment Patient Details Name: Samuel [name] MRN: [id number] DOB: March 28, 1961 Today's Date: 12/21/2022   History of Present Illness 62 yo M admitted to [facility] on 3/8 for cervical myelopathy, s/p C3-4 ACDF. PMH: cocaine and ETOH abuse    PT Comments    Pt was received in supine and agreeable to session. Pt was able to tolerate increased gait distance this session with slightly improved stability with RW support. Pt continued to demonstrate slight ataxia and decreased B hip flexion during gait trial, but was able to perform high marches in standing in place. Pt requires cues for safety awareness during mobility tasks. Pt continues to benefit from PT services to progress toward functional mobility goals.     Recommendations for follow up therapy are one component of a multi-disciplinary discharge planning process, led by the attending physician.  Recommendations may be updated based on patient status, additional functional criteria and insurance authorization.  Follow Up Recommendations  Acute inpatient rehab (3hours/day)     Assistance Recommended at Discharge Frequent or constant Supervision/Assistance  Patient can return home with the following A lot of help with walking and/or transfers;Assistance with cooking/housework;Assist for transportation;Help with stairs or ramp for entrance   Equipment Recommendations  Other (comment)    Recommendations for Other Services       Precautions / Restrictions Precautions Precautions: Fall;Cervical Precaution Booklet Issued: Yes (comment) Precaution Comments: reviewed precautions. Required Braces or Orthoses: Cervical Brace Cervical Brace: Hard collar Restrictions Weight Bearing Restrictions: No Other Position/Activity Restrictions: Continue to reposition brace as pt likes to wear it loose.     Mobility  Bed Mobility Overal bed mobility: Needs Assistance Bed Mobility: Supine to Sit, Sit to Supine     Supine to sit:  Supervision, HOB elevated Sit to supine: Supervision, HOB elevated   General bed mobility comments: Pt demonstrating log roll technique without cues. Use of bedrails and HOB elevated    Transfers Overall transfer level: Needs assistance Equipment used: Rolling walker (2 wheels) Transfers: Sit to/from Stand Sit to Stand: Min guard           General transfer comment: From EOB to RW with min guard for safety. Pt demonstrating safe hand placement. Cues for upright posture    Ambulation/Gait Ambulation/Gait assistance: Min guard Gait Distance (Feet): 190 Feet Assistive device: Rolling walker (2 wheels) Gait Pattern/deviations: Step-through pattern, Ataxic, Knee flexed in stance - right, Knee flexed in stance - left, Trunk flexed Gait velocity: decreased   Pre-gait activities: Marching in place with cues for technique General Gait Details: Pt slightly improving stability this session requiring min guard for safety. Cues for upright posture and RW proximity. pt demonstrating decreased hip flexion during gait trial, however is able to flex hip when marching in place       Balance Overall balance assessment: Needs assistance Sitting-balance support: Feet supported, No upper extremity supported Sitting balance-Leahy Scale: Good Sitting balance - Comments: sitting EOB   Standing balance support: Bilateral upper extremity supported, During functional activity, Reliant on assistive device for balance Standing balance-Leahy Scale: Poor Standing balance comment: with RW support                            Cognition Arousal/Alertness: Awake/alert Behavior During Therapy: WFL for tasks assessed/performed Overall Cognitive Status: Within Functional Limits for tasks assessed  Exercises      General Comments General comments (skin integrity, edema, etc.): Pt continues to say he his sleep deprived and does not like to  get up but is always willing to work once encouraged.      Pertinent Vitals/Pain Pain Assessment Pain Assessment: Faces Faces Pain Scale: Hurts a little bit Pain Location: neck Pain Descriptors / Indicators: Aching, Discomfort Pain Intervention(s): Limited activity within patient's tolerance, Monitored during session, Repositioned     PT Goals (current goals can now be found in the care plan section) Acute Rehab PT Goals Patient Stated Goal: get better and go home PT Goal Formulation: With patient/family Time For Goal Achievement: 12/31/22 Potential to Achieve Goals: Good Progress towards PT goals: Progressing toward goals    Frequency    Min 4X/week      PT Plan Current plan remains appropriate       AM-PAC PT "6 Clicks" Mobility   Outcome Measure  Help needed turning from your back to your side while in a flat bed without using bedrails?: A Little Help needed moving from lying on your back to sitting on the side of a flat bed without using bedrails?: A Little Help needed moving to and from a bed to a chair (including a wheelchair)?: A Little Help needed standing up from a chair using your arms (e.g., wheelchair or bedside chair)?: A Little Help needed to walk in hospital room?: A Little Help needed climbing 3-5 steps with a railing? : Total 6 Click Score: 16    End of Session Equipment Utilized During Treatment: Gait belt;Cervical collar Activity Tolerance: Patient tolerated treatment well Patient left: in bed;with bed alarm set;with call bell/phone within reach Nurse Communication: Mobility status PT Visit Diagnosis: Repeated falls (R29.6);Other abnormalities of gait and mobility (R26.89);Ataxic gait (R26.0);Muscle weakness (generalized) (M62.81)     Time: 1100-1115 PT Time Calculation (min) (ACUTE ONLY): 15 min  Charges:  $Gait Training: 8-22 mins                     Michelle Nasuti, PTA Acute Rehabilitation Services Secure Chat Preferred  [phone]  [phone]    Michelle Nasuti 12/21/2022, 11:26 AM

## 2022-12-21 NOTE — Progress Notes (Signed)
Occupational Therapy Treatment Patient Details Name: Samuel [name] MRN: [id number] DOB: Feb 11, 1961 Today's Date: 12/21/2022   History of present illness 62 yo M admitted to [facility] on 3/8 for cervical myelopathy, s/p C3-4 ACDF. PMH: cocaine and ETOH abuse   OT comments  Second session completed today as pt calling out to go to bathroom and sit up in chair to eat.  Noticed regular liquids on pts tray but nectar thick liquids written above bed. Will speak to nursing about swallowing precautions.    Recommendations for follow up therapy are one component of a multi-disciplinary discharge planning process, led by the attending physician.  Recommendations may be updated based on patient status, additional functional criteria and insurance authorization.    Follow Up Recommendations  Acute inpatient rehab (3hours/day)     Assistance Recommended at Discharge Frequent or constant Supervision/Assistance  Patient can return home with the following  A little help with walking and/or transfers;A little help with bathing/dressing/bathroom;Assistance with cooking/housework;Assist for transportation;Help with stairs or ramp for entrance   Equipment Recommendations  BSC/3in1    Recommendations for Other Services Rehab consult    Precautions / Restrictions Precautions Precautions: Fall;Cervical Precaution Booklet Issued: Yes (comment) Precaution Comments: reviewed precautions. Required Braces or Orthoses: Cervical Brace Cervical Brace: Hard collar Restrictions Weight Bearing Restrictions: No Other Position/Activity Restrictions: Continue to reposition brace as pt likes to wear it loose.       Mobility Bed Mobility Overal bed mobility: Needs Assistance Bed Mobility: Supine to Sit     Supine to sit: Supervision, HOB elevated     General bed mobility comments: Pt demonstrating log roll technique without cues. Use of bedrails and HOB elevated    Transfers Overall transfer level: Needs  assistance Equipment used: Rolling walker (2 wheels) Transfers: Sit to/from Stand Sit to Stand: Min guard Stand pivot transfers: Min guard   Step pivot transfers: Min guard     General transfer comment: From EOB x1 with min guard for safety. Cues for safe hand placement and upright posture.     Balance Overall balance assessment: Needs assistance Sitting-balance support: Feet supported, No upper extremity supported Sitting balance-Leahy Scale: Good Sitting balance - Comments: sitting EOB   Standing balance support: Bilateral upper extremity supported, During functional activity, Reliant on assistive device for balance Standing balance-Leahy Scale: Poor Standing balance comment: with RW support                           ADL either performed or assessed with clinical judgement   ADL Overall ADL's : Needs assistance/impaired     Grooming: Wash/dry hands;Min guard Grooming Details (indicate cue type and reason): Pt stood to brush teeth and wash hands without LOB at sink after toileting.             Lower Body Dressing: Minimal assistance;Sit to/from stand;Cueing for compensatory techniques Lower Body Dressing Details (indicate cue type and reason): Cues to dress weaker leg first. Pt donned boxers with min guard to stand and socks with min assist. Toilet Transfer: Minimal assistance;Ambulation;Grab bars;Comfort height toilet Toilet Transfer Details (indicate cue type and reason): Pt walked to bathroom with walker. Pt with no LOB. Cues for hand placement.  Pt then stood to urinate at toilet and needed to let go of grab bar with both hands. Pt stood for appx 2 minutes while having mild tremor while standing.  Pt required min assist to stablize self to urinate while not holding to anything.  Spoke with pt about trying to hold one hand to self and one hand on grab rail. Toileting- Clothing Manipulation and Hygiene: Minimal assistance;Sit to/from stand;Cueing for compensatory  techniques Toileting - Clothing Manipulation Details (indicate cue type and reason): min assist to maintain balance when holding to nothing.     Functional mobility during ADLs: Minimal assistance;Rolling walker (2 wheels) General ADL Comments: Pt making improvements in all areas. Pt continues to be ataxic with gait when up but fewer LOB.  Pt using BUE more for all adls.    Extremity/Trunk Assessment Upper Extremity Assessment Upper Extremity Assessment: LUE deficits/detail;RUE deficits/detail RUE Deficits / Details: Pt gripping objects easier and using RUE more for functional tasks. LUE Deficits / Details: Pt with in creased use of LUE during functional tasks.   Lower Extremity Assessment Lower Extremity Assessment: Defer to PT evaluation        Vision   Additional Comments: wears glasses during session.   Perception     Praxis      Cognition Arousal/Alertness: Awake/alert Behavior During Therapy: WFL for tasks assessed/performed Overall Cognitive Status: Within Functional Limits for tasks assessed                                 General Comments: Pt can state precautions but does not always follow them.  Continues to rotate neck and loosen brace.        Exercises      Shoulder Instructions       General Comments Pt continues to say he his sleep deprived and does not like to get up but is always willing to work once encouraged.    Pertinent Vitals/ Pain       Pain Assessment Pain Assessment: No/denies pain  Home Living                                          Prior Functioning/Environment              Frequency  Min 3X/week        Progress Toward Goals  OT Goals(current goals can now be found in the care plan section)  Progress towards OT goals: Progressing toward goals  Acute Rehab OT Goals Patient Stated Goal: to go home OT Goal Formulation: With patient Time For Goal Achievement: 12/31/22 Potential to  Achieve Goals: Good ADL Goals Pt Will Perform Eating: with set-up;with adaptive utensils Pt Will Perform Grooming: with set-up;sitting;with adaptive equipment Pt Will Perform Upper Body Bathing: with set-up;sitting Pt Will Perform Lower Body Bathing: with min assist;sit to/from stand;with adaptive equipment Pt Will Transfer to Toilet: ambulating;with min assist Pt/caregiver will Perform Home Exercise Program: Increased strength;Increased ROM;Both right and left upper extremity;With written HEP provided;With theraputty;Independently  Plan Discharge plan remains appropriate    Co-evaluation                 AM-PAC OT "6 Clicks" Daily Activity     Outcome Measure   Help from another person eating meals?: A Little Help from another person taking care of personal grooming?: A Little Help from another person toileting, which includes using toliet, bedpan, or urinal?: A Little Help from another person bathing (including washing, rinsing, drying)?: A Little Help from another person to put on and taking off regular upper body clothing?: A Little Help from another person  to put on and taking off regular lower body clothing?: A Little 6 Click Score: 18    End of Session Equipment Utilized During Treatment: Rolling walker (2 wheels);Cervical collar  OT Visit Diagnosis: Unsteadiness on feet (R26.81);Other abnormalities of gait and mobility (R26.89);Repeated falls (R29.6);Muscle weakness (generalized) (M62.81);Pain   Activity Tolerance Patient tolerated treatment well   Patient Left in chair;with call bell/phone within reach;with chair alarm set   Nurse Communication Other (comment) (Pt is nectar thick liquids but had regular liquids come on tray.)        Time: 1155-1206 OT Time Calculation (min): 11 min  Charges: OT General Charges $OT Visit: 1 Visit OT Treatments $Self Care/Home Management : 8-22 mins    Glenford Peers 12/21/2022, 12:19 PM

## 2022-12-21 NOTE — Progress Notes (Signed)
SLP Cancellation Note  Patient Details Name: [name] MRN: [id number] DOB: 12-Aug-1961   Cancelled treatment:       Reason Eval/Treat Not Completed: Other (comment). All diet recommendations following MBS yesterday were cancelled. Messaged MD to determine if SLP interventions are still desired as order to evaluate and treat is still active. MD reported that the pt refused full nectar thick liquids yesterday evening and MD explained risk and pt verbalized awareness and desire to continue regular solids and thin liquids regardless of dysphagia. Will sign off at this time after discussion with MD.    Lynann Beaver 12/21/2022, 2:21 PM

## 2022-12-21 NOTE — Progress Notes (Signed)
Inpatient Rehab Admissions Coordinator:   I have no beds available for this patient to admit to CIR today.  Will continue to follow for timing of potential admission pending bed availability.   Shann Medal, PT, DPT Admissions Coordinator [phone] 12/21/22  9:50 AM

## 2022-12-21 NOTE — H&P (Signed)
Physical Medicine and Rehabilitation Admission H&P    No chief complaint on file. : HPI: ***  ROS Past Medical History:  Diagnosis Date   Cervical myelopathy (HCC)    Elevated LFTs    Pancreatitis    Past Surgical History:  Procedure Laterality Date   ANTERIOR CERVICAL DECOMP/DISCECTOMY FUSION N/A 12/16/2022   Procedure: CERVICAL THREE FOUR ANTERIOR CERVICAL DISCENTOMY AND FUSION;  Surgeon: Callie Fielding, MD;  Location: [facility];  Service: Orthopedics;  Laterality: N/A;   NO PAST SURGERIES     Family History  Problem Relation Age of Onset   Breast cancer Mother    Pancreatitis Neg Hx    Liver disease Neg Hx    Colon cancer Neg Hx    Stomach cancer Neg Hx    Esophageal cancer Neg Hx    Social History:  reports that he has been smoking cigarettes. He has a 22.50 pack-year smoking history. He has never used smokeless tobacco. He reports current alcohol use of about 24.0 standard drinks of alcohol per week. He reports that he does not currently use drugs. Allergies: No Known Allergies Medications Prior to Admission  Medication Sig Dispense Refill   acetaminophen (TYLENOL) 500 MG tablet Take 1,000 mg by mouth every 6 (six) hours as needed (pain.).     aspirin 325 MG tablet Take 650 mg by mouth every 6 (six) hours as needed for mild pain or headache.     ibuprofen (ADVIL) 200 MG tablet Take 800 mg by mouth every 8 (eight) hours as needed (pain.).     gabapentin (NEURONTIN) 300 MG capsule Take 1 capsule (300 mg total) by mouth 3 (three) times daily. (Patient not taking: Reported on 12/12/2022) 60 capsule 0   meloxicam (MOBIC) 15 MG tablet Take 1 tablet (15 mg total) by mouth daily. (Patient not taking: Reported on 12/12/2022) 30 tablet 2   predniSONE (DELTASONE) 20 MG tablet Take 2 tablets (40 mg total) by mouth daily. 14 tablet 0      Home: Home Living Family/patient expects to be discharged to:: Private residence Living Arrangements: Alone Available Help at Discharge:  Friend(s), Family, Available 24 hours/day Type of Home: Apartment Home Access: Level entry Home Layout: One level Bathroom Shower/Tub: Chiropodist: Standard Bathroom Accessibility: No Home Equipment: Rollator (4 wheels), Shower seat Additional Comments: one level apt with no STE, friends and family can be available if needed   Functional History: Prior Function Prior Level of Function : Needs assist Physical Assist : Mobility (physical), ADLs (physical) Mobility (physical): Transfers, Gait ADLs (physical): Bathing, Dressing, IADLs, Toileting Mobility Comments: pt was independent with mobility, no AD, ~2-3 months ago, recently requiring assist and use of RW, with several falls over the past few weeks  Functional Status:  Mobility: Bed Mobility Overal bed mobility: Needs Assistance Bed Mobility: Supine to Sit, Sit to Supine Supine to sit: Supervision, HOB elevated Sit to supine: Supervision, HOB elevated General bed mobility comments: Pt demonstrating log roll technique without cues. Use of bedrails and HOB elevated Transfers Overall transfer level: Needs assistance Equipment used: Rolling walker (2 wheels) Transfers: Sit to/from Stand Sit to Stand: Min guard Bed to/from chair/wheelchair/BSC transfer type:: Stand pivot Stand pivot transfers: Min guard Step pivot transfers: Min guard General transfer comment: From EOB to RW with min guard for safety. Pt demonstrating safe hand placement. Cues for upright posture Ambulation/Gait Ambulation/Gait assistance: Min guard Gait Distance (Feet): 190 Feet Assistive device: Rolling walker (2 wheels) Gait Pattern/deviations: Step-through  pattern, Ataxic, Knee flexed in stance - right, Knee flexed in stance - left, Trunk flexed General Gait Details: Pt slightly improving stability this session requiring min guard for safety. Cues for upright posture and RW proximity. pt demonstrating decreased hip flexion during gait  trial, however is able to flex hip when marching in place Gait velocity: decreased Pre-gait activities: Marching in place with cues for technique    ADL: ADL Overall ADL's : Needs assistance/impaired Eating/Feeding: Set up, Sitting Eating/Feeding Details (indicate cue type and reason): Pt doing much better drinking from adaptive cup.  Pt states he still has hard time getting food to his mouth and thinks it is because of neck brace and that he cannot look down to see his food. Raised table up some for pt to eat while also putting elbows on pillows to attempt to have less fatigue when feeding.  Also helps pt tremendously to be OOB in the chair to eat. Grooming: Wash/dry hands, Wash/dry face, Oral care, Min guard, Standing Grooming Details (indicate cue type and reason): Pt stood to brush teeth and wash hands without LOB at sink after toileting. Upper Body Bathing: Minimal assistance, Sitting Lower Body Bathing: Maximal assistance, Sit to/from stand Upper Body Dressing : Moderate assistance, Cueing for UE precautions Lower Body Dressing: Minimal assistance, Sit to/from stand, Cueing for compensatory techniques Lower Body Dressing Details (indicate cue type and reason): Cues to dress weaker leg first. Pt donned boxers with min guard to stand and socks with min assist. Toilet Transfer: Minimal assistance, Ambulation, Grab bars, Comfort height toilet Toilet Transfer Details (indicate cue type and reason): Pt walked to bathroom with walker. Pt with no LOB. Cues for hand placement. Toileting- Clothing Manipulation and Hygiene: Minimal assistance, Sit to/from stand, Cueing for compensatory techniques Toileting - Clothing Manipulation Details (indicate cue type and reason): Pt foley now out. pt able to use urinal using BUE. Practiced setting urinal down with two hands vs attaching it to EOB when done so it does not spill. Pt requires min assist at times with this due to decreased fine motor  skills. Functional mobility during ADLs: Minimal assistance, Rolling walker (2 wheels) General ADL Comments: Pt making improvements in all areas. Pt continues to be ataxic with gait when up but fewer LOB.  Pt using BUE more for all adls.  Cognition: Cognition Overall Cognitive Status: Within Functional Limits for tasks assessed Orientation Level: Oriented to person, Oriented to place, Disoriented to time, Disoriented to situation Cognition Arousal/Alertness: Awake/alert Behavior During Therapy: WFL for tasks assessed/performed Overall Cognitive Status: Within Functional Limits for tasks assessed General Comments: Pt can state precautions but does not always follow them.  Continues to rotate neck and loosen brace.  Physical Exam: Blood pressure (!) 149/85, pulse 80, temperature 98.4 F (36.9 C), temperature source Oral, resp. rate 18, height '5\' 5"'$  (1.651 m), weight 44 kg, SpO2 100 %. Physical Exam  No results found for this or any previous visit (from the past 48 hour(s)). DG Swallowing Func-Speech Pathology  Result Date: 12/20/2022 Table formatting from the original result was not included. Modified Barium Swallow Study Patient Details Name: NICKALAS HEMM MRN: [id number] Date of Birth: August 01, 1961 Today's Date: 12/20/2022 HPI/PMH: HPI: 61 yo M admitted to [facility] on 3/8 for cervical myelopathy, s/p C3-4 ACDF. PMH: cocaine and ETOH abuse Clinical Impression: Pt demonstrates an acute dysphagia following ACDF with observable edema of posterior pharyngeal wall, restricting epiglottic inversion. There is decreased bolus propulsion through the pharynx and decreased UES opening with  accumulation of residue in the vallecular space and in the interarytenoid space and vestibule that is aspirated during and after swallow attempts. Pt has diminishing sensation of aspiration and cough is not always effective to clear thin liquids from airway (PAS 3-8). More cohesive nectar thick liquids are not aspirated during  the study and are most mobile to reduce severity of residue. Pt able to clear approximately 50% puree/pudding thick textures with a liquid wash and multiple effortful swallows. Pt had significant premature spillage of solids with immediate silent penetration to the glottis. There is also mild backflow of PO from the UES to the pyriforms due to appearance of bony protrusion at C6/7. Pt in a hard collar but a slight chin tuck did seem to aid clearance of vallecular residue; if pt given clearance to removal collar and mobilize neck this may be a very helpful strategy in the future. At this time recommend a full nectar thick liquid diet though could also tolerate purees if desired. Future session to work on use of strategies, pt awareness, and benefit of oral hygiene. Dysphagia should improve as edema decreases. Recommendations/Plan: Swallowing Evaluation Recommendations Swallowing Evaluation Recommendations Recommendations: PO diet PO Diet Recommendation: Full liquid diet; Mildly thick liquids (Level 2, nectar thick) Liquid Administration via: Cup; Straw Medication Administration: Crushed with puree Swallowing strategies  : Slow rate; Small bites/sips; Follow solids with liquids; Clear throat intermittently; Hard cough after swallowing; Multiple dry swallows after each bite/sip; effortful swallow Postural changes: Position pt fully upright for meals; Stay upright 30-60 min after meals Treatment Plan Treatment Plan Treatment recommendations: Therapy as outlined in treatment plan below Follow-up recommendations: Acute inpatient rehab (3 hours/day) Functional status assessment: Patient has had a recent decline in their functional status and demonstrates the ability to make significant improvements in function in a reasonable and predictable amount of time. Treatment frequency: Min 2x/week Treatment duration: 2 weeks Interventions: Aspiration precaution training; Compensatory techniques; Patient/family education; Trials of  upgraded texture/liquids; Diet toleration management by SLP Recommendations Recommendations for follow up therapy are one component of a multi-disciplinary discharge planning process, led by the attending physician.  Recommendations may be updated based on patient status, additional functional criteria and insurance authorization. Assessment: Orofacial Exam: Orofacial Exam Oral Cavity: Oral Hygiene: WFL Oral Cavity - Dentition: Missing dentition Orofacial Anatomy: WFL Oral Motor/Sensory Function: WFL Anatomy: Anatomy: Other (Comment) (edemal of posterior pharyngeal wall, cervical fusion at C3-4, bony protrusion at C6/7.) Thin Liquids: Thin Liquids (Level 0) Thin Liquids : Impaired Bolus delivery method: Spoon; Straw Thin Liquid - Impairment: Pharyngeal impairment Initiation of swallow : Valleculae Soft palate elevation: No bolus between soft palate (SP)/pharyngeal wall (PW) Laryngeal elevation: Complete superior movement of thyroid cartilage with complete approximation of arytenoids to epiglottic petiole Anterior hyoid excursion: Complete Epiglottic movement: Partial Laryngeal vestibule closure: Incomplete, narrow column air/contrast in laryngeal vestibule Pharyngeal stripping wave : Present - diminished (edematous, decreased movement) Pharyngeal contraction (A/P view only): N/A Pharyngoesophageal segment opening: Partial distention/partial duration, partial obstruction of flow Tongue base retraction: Trace column of contrast or air between tongue base and PPW Pharyngeal residue: Collection of residue within or on pharyngeal structures Location of pharyngeal residue: Valleculae; Aryepiglottic folds; Tongue base Penetration/Aspiration Scale (PAS) score: 8.  Material enters airway, passes BELOW cords without attempt by patient to eject out (silent aspiration); 7.  Material enters airway, passes BELOW cords and not ejected out despite cough attempt by patient; 6.  Material enters airway, passes BELOW cords then ejected  out; 1.  Material does  not enter airway; 2.  Material enters airway, remains ABOVE vocal cords then ejected out; 4.  Material enters airway, CONTACTS cords then ejected out  Mildly Thick Liquids: Mildly thick liquids (Level 2, nectar thick) Mildly thick liquids (Level 2, nectar thick): Impaired Bolus delivery method: Cup Mildly Thick Liquid - Impairment: Pharyngeal impairment Initiation of swallow : Posterior angle of the ramus Soft palate elevation: No bolus between soft palate (SP)/pharyngeal wall (PW) Laryngeal elevation: Complete superior movement of thyroid cartilage with complete approximation of arytenoids to epiglottic petiole Anterior hyoid excursion: Complete Epiglottic movement: Partial Laryngeal vestibule closure: Incomplete, narrow column air/contrast in laryngeal vestibule Pharyngeal stripping wave : Present - diminished Pharyngeal contraction (A/P view only): N/A Pharyngoesophageal segment opening: Partial distention/partial duration, partial obstruction of flow Tongue base retraction: Trace column of contrast or air between tongue base and PPW Pharyngeal residue: Collection of residue within or on pharyngeal structures Location of pharyngeal residue: Valleculae; Aryepiglottic folds Penetration/Aspiration Scale (PAS) score: 2.  Material enters airway, remains ABOVE vocal cords then ejected out; 1.  Material does not enter airway  Moderately Thick Liquids: Moderately thick liquids (Level 3, honey thick) Moderately thick liquids (Level 3, honey thick): Impaired Bolus delivery method: Spoon Moderately Thick Liquid - Impairment: Pharyngeal impairment Initiation of swallow : Valleculae Soft palate elevation: No bolus between soft palate (SP)/pharyngeal wall (PW) Laryngeal elevation: Complete superior movement of thyroid cartilage with complete approximation of arytenoids to epiglottic petiole Anterior hyoid excursion: Complete Epiglottic movement: Partial Laryngeal vestibule closure: Complete, no  air/contrast in laryngeal vestibule Pharyngeal stripping wave : Present - diminished Pharyngeal contraction (A/P view only): N/A Pharyngoesophageal segment opening: Partial distention/partial duration, partial obstruction of flow Tongue base retraction: Trace column of contrast or air between tongue base and PPW Pharyngeal residue: Collection of residue within or on pharyngeal structures Location of pharyngeal residue: Valleculae; Aryepiglottic folds Penetration/Aspiration Scale (PAS) score: 8.  Material enters airway, passes BELOW cords without attempt by patient to eject out (silent aspiration)  Puree: Puree Puree: Impaired Puree - Impairment: Pharyngeal impairment Initiation of swallow: Posterior angle of the ramus Soft palate elevation: No bolus between soft palate (SP)/pharyngeal wall (PW) Laryngeal elevation: Complete superior movement of thyroid cartilage with complete approximation of arytenoids to epiglottic petiole Anterior hyoid excursion: Complete Epiglottic movement: Partial Laryngeal vestibule closure: Complete, no air/contrast in laryngeal vestibule Pharyngeal stripping wave : Present - diminished Pharyngeal contraction (A/P view only): N/A Pharyngoesophageal segment opening: Partial distention/partial duration, partial obstruction of flow Tongue base retraction: Trace column of contrast or air between tongue base and PPW Pharyngeal residue: Collection of residue within or on pharyngeal structures Location of pharyngeal residue: Valleculae; Aryepiglottic folds Penetration/Aspiration Scale (PAS) score: 1.  Material does not enter airway Solid: Solid Solid: Impaired Solid - Impairment: Pharyngeal impairment; Oral Impairment Lip Closure: No labial escape Bolus preparation/mastication: Slow prolonged chewing/mashing with complete recollection Bolus transport/lingual motion: Brisk tongue motion Oral residue: Residue collection on oral structures Location of oral residue : Palate Initiation of swallow:  Posterior laryngeal surface of the epiglottis Soft palate elevation: No bolus between soft palate (SP)/pharyngeal wall (PW) Laryngeal elevation: Complete superior movement of thyroid cartilage with complete approximation of arytenoids to epiglottic petiole Anterior hyoid excursion: Complete Epiglottic movement: Partial Laryngeal vestibule closure: Complete, no air/contrast in laryngeal vestibule Pharyngeal stripping wave : Present - diminished Pharyngeal contraction (A/P view only): N/A Pharyngoesophageal segment opening: Partial distention/partial duration, partial obstruction of flow Tongue base retraction: Trace column of contrast or air between tongue base and PPW Pharyngeal residue:  Collection of residue within or on pharyngeal structures Location of pharyngeal residue: Valleculae; Aryepiglottic folds Penetration/Aspiration Scale (PAS) score: 4.  Material enters airway, CONTACTS cords then ejected out Pill: Pill Pill: Not Tested Compensatory Strategies: Compensatory Strategies Compensatory strategies: Yes Straw: Effective Effective Straw: Mildly thick liquid (Level 2, nectar thick) Effortful swallow: Effective Effective Effortful Swallow: Mildly thick liquid (Level 2, nectar thick); Puree; Solid Multiple swallows: Effective Effective Multiple Swallows: Mildly thick liquid (Level 2, nectar thick); Puree; Solid Chin tuck: Effective (partial and restricted movement) Effective Chin Tuck: Mildly thick liquid (Level 2, nectar thick); Puree Liquid wash: Effective Effective Liquid Wash: Puree; Solid Supraglottic swallow: Ineffective   General Information: Caregiver present: No  Diet Prior to this Study: Dysphagia 1 (pureed); Regular   Temperature : Normal   No data recorded  No data recorded  History of Recent Intubation: Yes  Behavior/Cognition: Alert; Cooperative; Pleasant mood Self-Feeding Abilities: Able to self-feed Baseline vocal quality/speech: Normal Volitional Cough: Able to elicit Volitional Swallow: Able to  elicit No data recorded Goal Planning: Prognosis for improved oropharyngeal function: Good No data recorded No data recorded No data recorded Consulted and agree with results and recommendations: Patient Pain: Pain Assessment Pain Assessment: Faces Pain Score: 8 Faces Pain Scale: 2 Pain Location: neck Pain Descriptors / Indicators: Aching; Discomfort Pain Intervention(s): Limited activity within patient's tolerance; Monitored during session; Repositioned End of Session: Start Time:SLP Start Time (ACUTE ONLY): 0900 Stop Time: SLP Stop Time (ACUTE ONLY): 0930 Time Calculation:SLP Time Calculation (min) (ACUTE ONLY): 30 min Charges: SLP Evaluations $ SLP Speech Visit: 1 Visit SLP Evaluations $BSS Swallow: 1 Procedure $MBS Swallow: 1 Procedure $Swallowing Treatment: 1 Procedure SLP visit diagnosis: SLP Visit Diagnosis: Dysphagia, oropharyngeal phase (R13.12) Past Medical History: Past Medical History: Diagnosis Date  Cervical myelopathy (HCC)   Elevated LFTs   Pancreatitis  Past Surgical History: Past Surgical History: Procedure Laterality Date  ANTERIOR CERVICAL DECOMP/DISCECTOMY FUSION N/A 12/16/2022  Procedure: CERVICAL THREE FOUR ANTERIOR CERVICAL DISCENTOMY AND FUSION;  Surgeon: Callie Fielding, MD;  Location: [place] [facility];  Service: Orthopedics;  Laterality: N/A;  NO PAST SURGERIES   DeBlois, Katherene Ponto 12/20/2022, 12:03 PM     Blood pressure (!) 149/85, pulse 80, temperature 98.4 F (36.9 C), temperature source Oral, resp. rate 18, height '5\' 5"'$  (1.651 m), weight 44 kg, SpO2 100 %.  Medical Problem List and Plan: 1. Functional deficits secondary to ***  -patient may *** shower  -ELOS/Goals: *** 2.  Antithrombotics: -DVT/anticoagulation:  {VTE PROPHYLAXIS/ANTICOAGULATION - [id number]  -antiplatelet therapy: *** 3. Pain Management: *** 4. Mood/Behavior/Sleep: ***  -antipsychotic agents: *** 5. Neuropsych/cognition: This patient *** capable of making decisions on *** own behalf. 6. Skin/Wound Care:  *** 7. Fluids/Electrolytes/Nutrition: ***     ***  Bary Leriche, PA-C 12/21/2022

## 2022-12-21 NOTE — Progress Notes (Signed)
Orthopedic Surgery Post-operative Progress Note  Assessment: Patient is a 62 y.o. male who is currently admitted after undergoing C3/4 ACDF   Plan: -Operative plans complete -Out of bed as tolerated with aspen collar -No bending/lifting/twisting greater than 10 pounds -PT/OT evaluation and treat -Pain control -Regular diet -DVT ppx: lovenox -Discharge to [facility] when bed available  _________________________________________________________________________  Subjective: No acute events overnight. Neck pain is improving. No radiating arm or leg pain. Feels his legs are slightly stronger. Sensation in arms and legs unchanged.   Patient does not like the collar. He said it rubs on his neck so he loosens it to make himself more comfortable.   Objective:  General: no acute distress, appropriate affect Neurologic: alert, answering questions appropriately, following commands Respiratory: unlabored breathing on room air Neck: no hematoma appreciated, collar loose this morning (readjusted) Skin: dressing clear/dry/intact  MSK (spine):   -Strength exam                                                   Left                  Right Grip strength                3/5                  3/5 Interosseus                  3/5                  3/5 Wrist extension            3/5                  4/5 Wrist flexion                 4/5                  4/5 Elbow extension           4/5                  4/5 Elbow flexion                4/5                  4/5 Deltoid                          4/5                  4/5   EHL                              4/5                  4/5 TA                                 4/5                  4/5 GSC  4/5                  4/5 Knee extension            4/5                  4/5 Knee flexion                 4/5                  4/5 Hip flexion                    4/5                  4/5   -Sensory exam                           Sensation  intact to light touch in L3-S1 nerve distributions of bilateral lower extremities (decreased in all distributions)             Sensation intact to light touch in C5-T1 nerve distributions of bilateral upper extremities (decreased in all distributions)   Patient name: Samuel [name] Patient MRN: [id number] Date: 12/21/22

## 2022-12-22 ENCOUNTER — Encounter (HOSPITAL_COMMUNITY): Payer: Self-pay | Admitting: Orthopedic Surgery

## 2022-12-22 ENCOUNTER — Inpatient Hospital Stay (HOSPITAL_COMMUNITY)
Admission: RE | Admit: 2022-12-22 | Discharge: 2023-01-03 | DRG: 092 | Disposition: A | Discharge status: Home / Self Care | Payer: Medicaid Other | Source: Intra-hospital | Attending: Physical Medicine and Rehabilitation | Admitting: Physical Medicine and Rehabilitation

## 2022-12-22 ENCOUNTER — Encounter (HOSPITAL_COMMUNITY): Payer: Self-pay | Admitting: Physical Medicine and Rehabilitation

## 2022-12-22 DIAGNOSIS — E876 Hypokalemia: Secondary | ICD-10-CM | POA: Diagnosis present

## 2022-12-22 DIAGNOSIS — Z79899 Other long term (current) drug therapy: Secondary | ICD-10-CM | POA: Diagnosis not present

## 2022-12-22 DIAGNOSIS — K861 Other chronic pancreatitis: Secondary | ICD-10-CM | POA: Diagnosis present

## 2022-12-22 DIAGNOSIS — M4804 Spinal stenosis, thoracic region: Secondary | ICD-10-CM | POA: Diagnosis present

## 2022-12-22 DIAGNOSIS — M4802 Spinal stenosis, cervical region: Secondary | ICD-10-CM | POA: Diagnosis present

## 2022-12-22 DIAGNOSIS — R2689 Other abnormalities of gait and mobility: Principal | ICD-10-CM | POA: Diagnosis present

## 2022-12-22 DIAGNOSIS — R1310 Dysphagia, unspecified: Secondary | ICD-10-CM | POA: Diagnosis present

## 2022-12-22 DIAGNOSIS — F1010 Alcohol abuse, uncomplicated: Secondary | ICD-10-CM | POA: Diagnosis present

## 2022-12-22 DIAGNOSIS — F17210 Nicotine dependence, cigarettes, uncomplicated: Secondary | ICD-10-CM | POA: Diagnosis present

## 2022-12-22 DIAGNOSIS — Z981 Arthrodesis status: Secondary | ICD-10-CM

## 2022-12-22 DIAGNOSIS — F1410 Cocaine abuse, uncomplicated: Secondary | ICD-10-CM | POA: Diagnosis present

## 2022-12-22 DIAGNOSIS — G9589 Other specified diseases of spinal cord: Secondary | ICD-10-CM | POA: Diagnosis present

## 2022-12-22 DIAGNOSIS — E871 Hypo-osmolality and hyponatremia: Secondary | ICD-10-CM | POA: Diagnosis present

## 2022-12-22 DIAGNOSIS — Z7952 Long term (current) use of systemic steroids: Secondary | ICD-10-CM | POA: Diagnosis not present

## 2022-12-22 DIAGNOSIS — Z791 Long term (current) use of non-steroidal anti-inflammatories (NSAID): Secondary | ICD-10-CM

## 2022-12-22 DIAGNOSIS — E440 Moderate protein-calorie malnutrition: Secondary | ICD-10-CM | POA: Diagnosis present

## 2022-12-22 DIAGNOSIS — Z803 Family history of malignant neoplasm of breast: Secondary | ICD-10-CM | POA: Diagnosis not present

## 2022-12-22 DIAGNOSIS — Z681 Body mass index (BMI) 19 or less, adult: Secondary | ICD-10-CM

## 2022-12-22 DIAGNOSIS — F17200 Nicotine dependence, unspecified, uncomplicated: Secondary | ICD-10-CM | POA: Diagnosis present

## 2022-12-22 DIAGNOSIS — R633 Feeding difficulties, unspecified: Secondary | ICD-10-CM | POA: Diagnosis present

## 2022-12-22 DIAGNOSIS — R471 Dysarthria and anarthria: Secondary | ICD-10-CM | POA: Diagnosis present

## 2022-12-22 DIAGNOSIS — R5381 Other malaise: Secondary | ICD-10-CM | POA: Diagnosis present

## 2022-12-22 DIAGNOSIS — N319 Neuromuscular dysfunction of bladder, unspecified: Secondary | ICD-10-CM | POA: Diagnosis present

## 2022-12-22 DIAGNOSIS — D62 Acute posthemorrhagic anemia: Secondary | ICD-10-CM | POA: Diagnosis present

## 2022-12-22 DIAGNOSIS — K5900 Constipation, unspecified: Secondary | ICD-10-CM | POA: Diagnosis present

## 2022-12-22 DIAGNOSIS — F1411 Cocaine abuse, in remission: Secondary | ICD-10-CM | POA: Diagnosis present

## 2022-12-22 DIAGNOSIS — R202 Paresthesia of skin: Secondary | ICD-10-CM | POA: Diagnosis present

## 2022-12-22 DIAGNOSIS — Z7982 Long term (current) use of aspirin: Secondary | ICD-10-CM | POA: Diagnosis not present

## 2022-12-22 DIAGNOSIS — R296 Repeated falls: Secondary | ICD-10-CM | POA: Diagnosis present

## 2022-12-22 DIAGNOSIS — G4700 Insomnia, unspecified: Secondary | ICD-10-CM | POA: Diagnosis present

## 2022-12-22 DIAGNOSIS — R200 Anesthesia of skin: Secondary | ICD-10-CM | POA: Diagnosis present

## 2022-12-22 DIAGNOSIS — M5001 Cervical disc disorder with myelopathy,  high cervical region: Secondary | ICD-10-CM | POA: Diagnosis present

## 2022-12-22 DIAGNOSIS — G959 Disease of spinal cord, unspecified: Secondary | ICD-10-CM

## 2022-12-22 DIAGNOSIS — Z9889 Other specified postprocedural states: Secondary | ICD-10-CM

## 2022-12-22 DIAGNOSIS — R64 Cachexia: Secondary | ICD-10-CM | POA: Diagnosis present

## 2022-12-22 DIAGNOSIS — F1011 Alcohol abuse, in remission: Secondary | ICD-10-CM | POA: Diagnosis present

## 2022-12-22 DIAGNOSIS — M6281 Muscle weakness (generalized): Secondary | ICD-10-CM | POA: Diagnosis present

## 2022-12-22 MED ORDER — LIDOCAINE HCL URETHRAL/MUCOSAL 2 % EX GEL: CUTANEOUS | Status: DC | PRN

## 2022-12-22 MED ORDER — ALUM & MAG HYDROXIDE-SIMETH 200-200-20 MG/5ML PO SUSP: 30.0000 mL | ORAL | Status: DC | PRN

## 2022-12-22 MED ORDER — PROCHLORPERAZINE MALEATE 5 MG PO TABS: 5.0000 mg | ORAL_TABLET | Freq: Four times a day (QID) | ORAL | Status: DC | PRN

## 2022-12-22 MED ORDER — HYDROCODONE-ACETAMINOPHEN 5-325 MG PO TABS: 1.0000 | ORAL_TABLET | ORAL | 0 refills | Status: DC | PRN

## 2022-12-22 MED ORDER — ACETAMINOPHEN 325 MG PO TABS
325.0000 mg | ORAL_TABLET | ORAL | Status: DC | PRN
  Administered 2022-12-23 – 2022-12-25 (×7): 650 mg via ORAL
  Administered 2022-12-26: 325 mg via ORAL
  Administered 2022-12-30 – 2023-01-03 (×5): 650 mg via ORAL
  Filled 2022-12-22 (×7): qty 2
  Filled 2022-12-22: qty 1
  Filled 2022-12-22 (×6): qty 2

## 2022-12-22 MED ORDER — PROCHLORPERAZINE EDISYLATE 10 MG/2ML IJ SOLN: 5.0000 mg | Freq: Four times a day (QID) | INTRAMUSCULAR | Status: DC | PRN

## 2022-12-22 MED ORDER — SENNA 8.6 MG PO TABS: 1.0000 | ORAL_TABLET | Freq: Two times a day (BID) | ORAL | 0 refills | Status: DC

## 2022-12-22 MED ORDER — PROCHLORPERAZINE 25 MG RE SUPP: 12.5000 mg | Freq: Four times a day (QID) | RECTAL | Status: DC | PRN

## 2022-12-22 MED ORDER — HYDROCODONE-ACETAMINOPHEN 5-325 MG PO TABS
1.0000 | ORAL_TABLET | ORAL | Status: DC | PRN
  Administered 2022-12-31 (×2): 1 via ORAL
  Filled 2022-12-22 (×4): qty 1

## 2022-12-22 MED ORDER — ENOXAPARIN SODIUM 30 MG/0.3ML IJ SOSY: 30.0000 mg | PREFILLED_SYRINGE | Freq: Every day | INTRAMUSCULAR | 0 refills | Status: DC

## 2022-12-22 MED ORDER — DIPHENHYDRAMINE HCL 12.5 MG/5ML PO ELIX: 12.5000 mg | ORAL_SOLUTION | Freq: Four times a day (QID) | ORAL | Status: DC | PRN

## 2022-12-22 MED ORDER — ENSURE SURGERY PO LIQD
237.0000 mL | Freq: Two times a day (BID) | ORAL | Status: DC
  Administered 2022-12-22 – 2023-01-02 (×14): 237 mL via ORAL
  Filled 2022-12-22 (×25): qty 237

## 2022-12-22 MED ORDER — DOCUSATE SODIUM 100 MG PO CAPS
100.0000 mg | ORAL_CAPSULE | Freq: Every day | ORAL | Status: DC
  Administered 2022-12-23: 100 mg via ORAL
  Filled 2022-12-22 (×2): qty 1

## 2022-12-22 MED ORDER — FLEET ENEMA 7-19 GM/118ML RE ENEM: 1.0000 | ENEMA | Freq: Once | RECTAL | Status: DC | PRN

## 2022-12-22 MED ORDER — MELATONIN 5 MG PO TABS
5.0000 mg | ORAL_TABLET | Freq: Every day | ORAL | Status: DC
  Administered 2022-12-23 – 2022-12-25 (×3): 5 mg via ORAL
  Filled 2022-12-22 (×3): qty 1

## 2022-12-22 MED ORDER — ENOXAPARIN SODIUM 30 MG/0.3ML IJ SOSY
30.0000 mg | PREFILLED_SYRINGE | INTRAMUSCULAR | Status: DC
  Administered 2022-12-23 – 2023-01-02 (×11): 30 mg via SUBCUTANEOUS
  Filled 2022-12-22 (×12): qty 0.3

## 2022-12-22 MED ORDER — TAMSULOSIN HCL 0.4 MG PO CAPS
0.4000 mg | ORAL_CAPSULE | Freq: Every day | ORAL | Status: DC
  Administered 2022-12-22 – 2022-12-27 (×6): 0.4 mg via ORAL
  Filled 2022-12-22 (×6): qty 1

## 2022-12-22 MED ORDER — GUAIFENESIN-DM 100-10 MG/5ML PO SYRP: 5.0000 mL | ORAL_SOLUTION | Freq: Four times a day (QID) | ORAL | Status: DC | PRN

## 2022-12-22 MED ORDER — METHOCARBAMOL 500 MG PO TABS
500.0000 mg | ORAL_TABLET | Freq: Four times a day (QID) | ORAL | Status: DC | PRN
  Administered 2022-12-23 – 2023-01-01 (×5): 500 mg via ORAL
  Filled 2022-12-22 (×5): qty 1

## 2022-12-22 MED ORDER — POLYETHYLENE GLYCOL 3350 17 G PO PACK: 17.0000 g | PACK | Freq: Every day | ORAL | Status: DC | PRN

## 2022-12-22 MED ORDER — BISACODYL 10 MG RE SUPP: 10.0000 mg | Freq: Every day | RECTAL | Status: DC | PRN

## 2022-12-22 MED ORDER — TRAMADOL HCL 50 MG PO TABS
50.0000 mg | ORAL_TABLET | Freq: Four times a day (QID) | ORAL | Status: DC | PRN
  Administered 2022-12-28: 50 mg via ORAL
  Filled 2022-12-22 (×2): qty 1

## 2022-12-22 NOTE — H&P (Signed)
Physical Medicine and Rehabilitation Admission H&P     CC: Functional decline due to cervical myelopathy.     HPI: Samuel Niccoli. [name] is a 62 year old R handed male with history of chronic pancreatitis with diarrhea, GB polyps, abnormal LFTs, ETOH/Cocaine abuse in the  past (last (+) cocaine result 2012), 3 year history of numbness/tingling bilateral hands with progressive weakness, decrease in dexterity and now with inability to feel his hands with numbness extending to forearms, difficulty feeding himself and numbness BLE. 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 He was evaluated by Dr. Laurance Flatten who recommended surgical intervention due to cervical myelopathy. Patient underwent ACDF C3/C4 on 12/16/22.    Post op, ABLA being monitored and transient hyponatremia resolved and  Hypokalemia treated with Kdur X 1. He continues on Ensure for evidence of low calorie malnutrition.  ST consulted due to reports of cough with sips of liquids as well as need to expectorate solids at times. MBS done 03/12 showing observable edema posterior pharyngeal wall restricting epiglottic inversion with residue and aspiration and spillage of solids with penetration to vocal cords. Nectar thick liquid diet recommended with aspiration precautions due to "increase risk of adverse event". ST felt that dysphagia should improve as edema decreases but patient refused dietary modification and risks of aspiration discussed by surgeon per records therefore ST signed off.  He is to continue to wear collar at all times X 6 weeks but may remove for shower.    PTA- patient has been staying with friend for past 3 months. He has was independent but has had significant decline in past 2-3  months, and in the past few weeks has had multiple falls and  required assistance with mobility and ADLs. Sister/nephew have also been checking in on him.  PT/OT has been working with patient who continues to be limited by BUE weakness, ataxia w/balance deficits, difficulty swallowing but declined modified diet and has poor safety awareness. CIR recommended due to functional decline.      Pt reports voiding all the time- feels like he needs to void within 15-30 minutes after last time- sounds like urinary retention- from [place] compression. LBM this AM, but still feels constipated. Cannot feel buttocks as well as lower arms/legs. Pain improved- mainly just because cervical collar in pt's opinion. Not sleeping great- they keep waking him up and cannot get comfortable- would be willing to try something for sleep.    Review of Systems  Constitutional:  Negative for chills and fever.  HENT:  Negative for hearing loss.   Eyes:  Negative for blurred vision.  Respiratory:  Negative for shortness of breath.   Cardiovascular:  Negative for chest pain.  Gastrointestinal:  Positive for constipation (had BM today.). Negative for heartburn.  Genitourinary:  Positive for frequency. Negative for dysuria and urgency.  Musculoskeletal:  Negative for neck pain.  Neurological:  Positive for tingling, sensory change and weakness. Negative for dizziness and headaches.  Psychiatric/Behavioral:  The patient has insomnia (due to collar discomfort).   All other systems reviewed and are negative.         Past Medical History:  Diagnosis Date   Cervical myelopathy (HCC)     Elevated LFTs     Pancreatitis  Past Surgical History:  Procedure Laterality Date   ANTERIOR CERVICAL DECOMP/DISCECTOMY FUSION N/A 12/16/2022    Procedure: CERVICAL THREE FOUR ANTERIOR CERVICAL DISCENTOMY AND FUSION;  Surgeon: Callie Fielding, MD;  Location: [facility];  Service: Orthopedics;  Laterality: N/A;   NO PAST SURGERIES                Family History  Problem Relation Age of Onset   Breast cancer Mother     Pancreatitis Neg Hx     Liver disease Neg Hx     Colon cancer Neg Hx     Stomach cancer Neg Hx     Esophageal cancer Neg Hx        Social History:  Divorced. He lives with a friend. He reports that he has been smoking cigarettes about 1/2 PPD. Marland Kitchen He has a 22.50 pack-year smoking history. He has never used smokeless tobacco. He reports current alcohol use of about 24.0 standard drinks of alcohol per week. He reports that he does not currently use drugs.     Allergies: No Known Allergies           Medications Prior to Admission  Medication Sig Dispense Refill   acetaminophen (TYLENOL) 500 MG tablet Take 1,000 mg by mouth every 6 (six) hours as needed (pain.).       aspirin 325 MG tablet Take 650 mg by mouth every 6 (six) hours as needed for mild pain or headache.       ibuprofen (ADVIL) 200 MG tablet Take 800 mg by mouth every 8 (eight) hours as needed (pain.).       gabapentin (NEURONTIN) 300 MG capsule Take 1 capsule (300 mg total) by mouth 3 (three) times daily. (Patient not taking: Reported on 12/12/2022) 60 capsule 0   meloxicam (MOBIC) 15 MG tablet Take 1 tablet (15 mg total) by mouth daily. (Patient not taking: Reported on 12/12/2022) 30 tablet 2   predniSONE (DELTASONE) 20 MG tablet Take 2 tablets (40 mg total) by mouth daily. 14 tablet 0          Home: Home Living Family/patient expects to be discharged to:: Private residence Living Arrangements: Alone Available Help at Discharge: Friend(s), Family, Available 24 hours/day Type of Home: Apartment Home Access: Level entry Home Layout: One level Bathroom Shower/Tub: Chiropodist: Standard Bathroom Accessibility: No Home Equipment: Rollator (4 wheels), Shower seat Additional Comments: one level apt with no STE, friends and family can be available if needed   Functional History: Prior Function Prior Level of Function : Needs  assist Physical Assist : Mobility (physical), ADLs (physical) Mobility (physical): Transfers, Gait ADLs (physical): Bathing, Dressing, IADLs, Toileting Mobility Comments: pt was independent with mobility, no AD, ~2-3 months ago, recently requiring assist and use of RW, with several falls over the past few weeks   Functional Status:  Mobility: Bed Mobility Overal bed mobility: Needs Assistance Bed Mobility: Supine to Sit, Sit to Supine Supine to sit: Supervision, HOB elevated Sit to supine: Supervision, HOB elevated General bed mobility comments: Pt demonstrating log roll technique without cues. Use of bedrails and HOB elevated Transfers Overall transfer level: Needs assistance Equipment used: Rolling walker (2 wheels) Transfers: Sit to/from Stand Sit to Stand: Min guard Bed to/from chair/wheelchair/BSC transfer type:: Stand pivot Stand pivot transfers: Min guard Step pivot transfers: Min guard General transfer comment: From EOB to RW with min guard for safety. Pt demonstrating safe hand placement. Cues for upright posture Ambulation/Gait Ambulation/Gait assistance: Min guard Gait  Distance (Feet): 190 Feet Assistive device: Rolling walker (2 wheels) Gait Pattern/deviations: Step-through pattern, Ataxic, Knee flexed in stance - right, Knee flexed in stance - left, Trunk flexed General Gait Details: Pt slightly improving stability this session requiring min guard for safety. Cues for upright posture and RW proximity. pt demonstrating decreased hip flexion during gait trial, however is able to flex hip when marching in place Gait velocity: decreased Pre-gait activities: Marching in place with cues for technique   ADL: ADL Overall ADL's : Needs assistance/impaired Eating/Feeding: Set up, Sitting Eating/Feeding Details (indicate cue type and reason): Pt doing much better drinking from adaptive cup.  Pt states he still has hard time getting food to his mouth and thinks it is because of  neck brace and that he cannot look down to see his food. Raised table up some for pt to eat while also putting elbows on pillows to attempt to have less fatigue when feeding.  Also helps pt tremendously to be OOB in the chair to eat. Grooming: Wash/dry hands, Wash/dry face, Oral care, Min guard, Standing Grooming Details (indicate cue type and reason): Pt stood to brush teeth and wash hands without LOB at sink after toileting. Upper Body Bathing: Minimal assistance, Sitting Lower Body Bathing: Maximal assistance, Sit to/from stand Upper Body Dressing : Moderate assistance, Cueing for UE precautions Lower Body Dressing: Minimal assistance, Sit to/from stand, Cueing for compensatory techniques Lower Body Dressing Details (indicate cue type and reason): Cues to dress weaker leg first. Pt donned boxers with min guard to stand and socks with min assist. Toilet Transfer: Minimal assistance, Ambulation, Grab bars, Comfort height toilet Toilet Transfer Details (indicate cue type and reason): Pt walked to bathroom with walker. Pt with no LOB. Cues for hand placement. Toileting- Clothing Manipulation and Hygiene: Minimal assistance, Sit to/from stand, Cueing for compensatory techniques Toileting - Clothing Manipulation Details (indicate cue type and reason): Pt foley now out. pt able to use urinal using BUE. Practiced setting urinal down with two hands vs attaching it to EOB when done so it does not spill. Pt requires min assist at times with this due to decreased fine motor skills. Functional mobility during ADLs: Minimal assistance, Rolling walker (2 wheels) General ADL Comments: Pt making improvements in all areas. Pt continues to be ataxic with gait when up but fewer LOB.  Pt using BUE more for all adls.   Cognition: Cognition Overall Cognitive Status: Within Functional Limits for tasks assessed Orientation Level: Oriented to person, Oriented to place, Disoriented to time, Disoriented to  situation Cognition Arousal/Alertness: Awake/alert Behavior During Therapy: WFL for tasks assessed/performed Overall Cognitive Status: Within Functional Limits for tasks assessed General Comments: Pt can state precautions but does not always follow them.  Continues to rotate neck and loosen brace.     Blood pressure (!) 149/85, pulse 80, temperature 98.4 F (36.9 C), temperature source Oral, resp. rate 18, height '5\' 5"'$  (1.651 m), weight 44 kg, SpO2 100 %. Physical Exam Vitals and nursing note reviewed.  Constitutional:      Appearance: He is cachectic.     Comments: Pt awake, alert, wearing cervical collar- BMI 15; cachetic appearing, appropriate, NAD  HENT:     Head: Normocephalic and atraumatic.     Comments: Facial symmetry intact Tongue midline    Nose: Nose normal. No congestion.     Mouth/Throat:     Mouth: Mucous membranes are dry.     Pharynx: Oropharynx is clear. No oropharyngeal exudate.  Eyes:  General:        Right eye: No discharge.        Left eye: No discharge.     Extraocular Movements: Extraocular movements intact.  Neck:     Comments: Collar in place. Has anterior cervical incision covered with dry dressing- C/D/I Cardiovascular:     Rate and Rhythm: Normal rate and regular rhythm.     Heart sounds: Normal heart sounds. No murmur heard.    No gallop.  Pulmonary:     Effort: Pulmonary effort is normal.     Comments: Sounds a little gurgly- a little coarse- but good air movement Abdominal:     General: Abdomen is flat. There is distension.     Palpations: Abdomen is soft.     Tenderness: There is no abdominal tenderness.     Comments: Slightly hypoactive BS  Musculoskeletal:     Comments: UE strength- biceps 4+/5; WE 4+ on R and 4/5 on L; triceps 4/5; Grip 3+/5; and FA 3+/5 B/L LE strength- HF 4/5; KE 4+/5; DF 4/5; and PF 4+/5 B/L   Skin:    General: Skin is warm and dry.     Comments: Cervical incision per neck Bruising and scabs between legs from  prior use of brief  Neurological:     Mental Status: He is alert and oriented to person, place, and time.     Comments: Soft voice with moderate dysarthria and slurring at times. He was able to follow simple commands without difficulty. BUE weakness with sensory deficits. BLE with significant ataxia.  C6 sensory level decreased to light touch down to T2- torso is intact per pt- but L1-S4 is decreased to light touch  Psychiatric:        Mood and Affect: Mood normal.        Behavior: Behavior normal.        Lab Results Last 48 Hours  No results found for this or any previous visit (from the past 48 hour(s)).    Imaging Results (Last 48 hours)  DG Swallowing Func-Speech Pathology   Result Date: 12/20/2022 Table formatting from the original result was not included. Modified Barium Swallow Study Patient Details Name: LOVELACE KNUTESON MRN: [id number] Date of Birth: Jul 10, 1961 Today's Date: 12/20/2022 HPI/PMH: HPI: 62 yo M admitted to [facility] on 3/8 for cervical myelopathy, s/p C3-4 ACDF. PMH: cocaine and ETOH abuse Clinical Impression: Pt demonstrates an acute dysphagia following ACDF with observable edema of posterior pharyngeal wall, restricting epiglottic inversion. There is decreased bolus propulsion through the pharynx and decreased UES opening with accumulation of residue in the vallecular space and in the interarytenoid space and vestibule that is aspirated during and after swallow attempts. Pt has diminishing sensation of aspiration and cough is not always effective to clear thin liquids from airway (PAS 3-8). More cohesive nectar thick liquids are not aspirated during the study and are most mobile to reduce severity of residue. Pt able to clear approximately 50% puree/pudding thick textures with a liquid wash and multiple effortful swallows. Pt had significant premature spillage of solids with immediate silent penetration to the glottis. There is also mild backflow of PO from the UES to the pyriforms  due to appearance of bony protrusion at C6/7. Pt in a hard collar but a slight chin tuck did seem to aid clearance of vallecular residue; if pt given clearance to removal collar and mobilize neck this may be a very helpful strategy in the future. At this time recommend a full nectar  thick liquid diet though could also tolerate purees if desired. Future session to work on use of strategies, pt awareness, and benefit of oral hygiene. Dysphagia should improve as edema decreases. Recommendations/Plan: Swallowing Evaluation Recommendations Swallowing Evaluation Recommendations Recommendations: PO diet PO Diet Recommendation: Full liquid diet; Mildly thick liquids (Level 2, nectar thick) Liquid Administration via: Cup; Straw Medication Administration: Crushed with puree Swallowing strategies  : Slow rate; Small bites/sips; Follow solids with liquids; Clear throat intermittently; Hard cough after swallowing; Multiple dry swallows after each bite/sip; effortful swallow Postural changes: Position pt fully upright for meals; Stay upright 30-60 min after meals Treatment Plan Treatment Plan Treatment recommendations: Therapy as outlined in treatment plan below Follow-up recommendations: Acute inpatient rehab (3 hours/day) Functional status assessment: Patient has had a recent decline in their functional status and demonstrates the ability to make significant improvements in function in a reasonable and predictable amount of time. Treatment frequency: Min 2x/week Treatment duration: 2 weeks Interventions: Aspiration precaution training; Compensatory techniques; Patient/family education; Trials of upgraded texture/liquids; Diet toleration management by SLP Recommendations Recommendations for follow up therapy are one component of a multi-disciplinary discharge planning process, led by the attending physician.  Recommendations may be updated based on patient status, additional functional criteria and insurance authorization.  Assessment: Orofacial Exam: Orofacial Exam Oral Cavity: Oral Hygiene: WFL Oral Cavity - Dentition: Missing dentition Orofacial Anatomy: WFL Oral Motor/Sensory Function: WFL Anatomy: Anatomy: Other (Comment) (edemal of posterior pharyngeal wall, cervical fusion at C3-4, bony protrusion at C6/7.) Thin Liquids: Thin Liquids (Level 0) Thin Liquids : Impaired Bolus delivery method: Spoon; Straw Thin Liquid - Impairment: Pharyngeal impairment Initiation of swallow : Valleculae Soft palate elevation: No bolus between soft palate (SP)/pharyngeal wall (PW) Laryngeal elevation: Complete superior movement of thyroid cartilage with complete approximation of arytenoids to epiglottic petiole Anterior hyoid excursion: Complete Epiglottic movement: Partial Laryngeal vestibule closure: Incomplete, narrow column air/contrast in laryngeal vestibule Pharyngeal stripping wave : Present - diminished (edematous, decreased movement) Pharyngeal contraction (A/P view only): N/A Pharyngoesophageal segment opening: Partial distention/partial duration, partial obstruction of flow Tongue base retraction: Trace column of contrast or air between tongue base and PPW Pharyngeal residue: Collection of residue within or on pharyngeal structures Location of pharyngeal residue: Valleculae; Aryepiglottic folds; Tongue base Penetration/Aspiration Scale (PAS) score: 8.  Material enters airway, passes BELOW cords without attempt by patient to eject out (silent aspiration); 7.  Material enters airway, passes BELOW cords and not ejected out despite cough attempt by patient; 6.  Material enters airway, passes BELOW cords then ejected out; 1.  Material does not enter airway; 2.  Material enters airway, remains ABOVE vocal cords then ejected out; 4.  Material enters airway, CONTACTS cords then ejected out  Mildly Thick Liquids: Mildly thick liquids (Level 2, nectar thick) Mildly thick liquids (Level 2, nectar thick): Impaired Bolus delivery method: Cup Mildly  Thick Liquid - Impairment: Pharyngeal impairment Initiation of swallow : Posterior angle of the ramus Soft palate elevation: No bolus between soft palate (SP)/pharyngeal wall (PW) Laryngeal elevation: Complete superior movement of thyroid cartilage with complete approximation of arytenoids to epiglottic petiole Anterior hyoid excursion: Complete Epiglottic movement: Partial Laryngeal vestibule closure: Incomplete, narrow column air/contrast in laryngeal vestibule Pharyngeal stripping wave : Present - diminished Pharyngeal contraction (A/P view only): N/A Pharyngoesophageal segment opening: Partial distention/partial duration, partial obstruction of flow Tongue base retraction: Trace column of contrast or air between tongue base and PPW Pharyngeal residue: Collection of residue within or on pharyngeal structures Location of pharyngeal residue: Valleculae;  Aryepiglottic folds Penetration/Aspiration Scale (PAS) score: 2.  Material enters airway, remains ABOVE vocal cords then ejected out; 1.  Material does not enter airway  Moderately Thick Liquids: Moderately thick liquids (Level 3, honey thick) Moderately thick liquids (Level 3, honey thick): Impaired Bolus delivery method: Spoon Moderately Thick Liquid - Impairment: Pharyngeal impairment Initiation of swallow : Valleculae Soft palate elevation: No bolus between soft palate (SP)/pharyngeal wall (PW) Laryngeal elevation: Complete superior movement of thyroid cartilage with complete approximation of arytenoids to epiglottic petiole Anterior hyoid excursion: Complete Epiglottic movement: Partial Laryngeal vestibule closure: Complete, no air/contrast in laryngeal vestibule Pharyngeal stripping wave : Present - diminished Pharyngeal contraction (A/P view only): N/A Pharyngoesophageal segment opening: Partial distention/partial duration, partial obstruction of flow Tongue base retraction: Trace column of contrast or air between tongue base and PPW Pharyngeal residue:  Collection of residue within or on pharyngeal structures Location of pharyngeal residue: Valleculae; Aryepiglottic folds Penetration/Aspiration Scale (PAS) score: 8.  Material enters airway, passes BELOW cords without attempt by patient to eject out (silent aspiration)  Puree: Puree Puree: Impaired Puree - Impairment: Pharyngeal impairment Initiation of swallow: Posterior angle of the ramus Soft palate elevation: No bolus between soft palate (SP)/pharyngeal wall (PW) Laryngeal elevation: Complete superior movement of thyroid cartilage with complete approximation of arytenoids to epiglottic petiole Anterior hyoid excursion: Complete Epiglottic movement: Partial Laryngeal vestibule closure: Complete, no air/contrast in laryngeal vestibule Pharyngeal stripping wave : Present - diminished Pharyngeal contraction (A/P view only): N/A Pharyngoesophageal segment opening: Partial distention/partial duration, partial obstruction of flow Tongue base retraction: Trace column of contrast or air between tongue base and PPW Pharyngeal residue: Collection of residue within or on pharyngeal structures Location of pharyngeal residue: Valleculae; Aryepiglottic folds Penetration/Aspiration Scale (PAS) score: 1.  Material does not enter airway Solid: Solid Solid: Impaired Solid - Impairment: Pharyngeal impairment; Oral Impairment Lip Closure: No labial escape Bolus preparation/mastication: Slow prolonged chewing/mashing with complete recollection Bolus transport/lingual motion: Brisk tongue motion Oral residue: Residue collection on oral structures Location of oral residue : Palate Initiation of swallow: Posterior laryngeal surface of the epiglottis Soft palate elevation: No bolus between soft palate (SP)/pharyngeal wall (PW) Laryngeal elevation: Complete superior movement of thyroid cartilage with complete approximation of arytenoids to epiglottic petiole Anterior hyoid excursion: Complete Epiglottic movement: Partial Laryngeal  vestibule closure: Complete, no air/contrast in laryngeal vestibule Pharyngeal stripping wave : Present - diminished Pharyngeal contraction (A/P view only): N/A Pharyngoesophageal segment opening: Partial distention/partial duration, partial obstruction of flow Tongue base retraction: Trace column of contrast or air between tongue base and PPW Pharyngeal residue: Collection of residue within or on pharyngeal structures Location of pharyngeal residue: Valleculae; Aryepiglottic folds Penetration/Aspiration Scale (PAS) score: 4.  Material enters airway, CONTACTS cords then ejected out Pill: Pill Pill: Not Tested Compensatory Strategies: Compensatory Strategies Compensatory strategies: Yes Straw: Effective Effective Straw: Mildly thick liquid (Level 2, nectar thick) Effortful swallow: Effective Effective Effortful Swallow: Mildly thick liquid (Level 2, nectar thick); Puree; Solid Multiple swallows: Effective Effective Multiple Swallows: Mildly thick liquid (Level 2, nectar thick); Puree; Solid Chin tuck: Effective (partial and restricted movement) Effective Chin Tuck: Mildly thick liquid (Level 2, nectar thick); Puree Liquid wash: Effective Effective Liquid Wash: Puree; Solid Supraglottic swallow: Ineffective   General Information: Caregiver present: No  Diet Prior to this Study: Dysphagia 1 (pureed); Regular   Temperature : Normal   No data recorded  No data recorded  History of Recent Intubation: Yes  Behavior/Cognition: Alert; Cooperative; Pleasant mood Self-Feeding Abilities: Able to self-feed Baseline  vocal quality/speech: Normal Volitional Cough: Able to elicit Volitional Swallow: Able to elicit No data recorded Goal Planning: Prognosis for improved oropharyngeal function: Good No data recorded No data recorded No data recorded Consulted and agree with results and recommendations: Patient Pain: Pain Assessment Pain Assessment: Faces Pain Score: 8 Faces Pain Scale: 2 Pain Location: neck Pain Descriptors /  Indicators: Aching; Discomfort Pain Intervention(s): Limited activity within patient's tolerance; Monitored during session; Repositioned End of Session: Start Time:SLP Start Time (ACUTE ONLY): 0900 Stop Time: SLP Stop Time (ACUTE ONLY): 0930 Time Calculation:SLP Time Calculation (min) (ACUTE ONLY): 30 min Charges: SLP Evaluations $ SLP Speech Visit: 1 Visit SLP Evaluations $BSS Swallow: 1 Procedure $MBS Swallow: 1 Procedure $Swallowing Treatment: 1 Procedure SLP visit diagnosis: SLP Visit Diagnosis: Dysphagia, oropharyngeal phase (R13.12) Past Medical History: Past Medical History: Diagnosis Date  Cervical myelopathy (HCC)   Elevated LFTs   Pancreatitis  Past Surgical History: Past Surgical History: Procedure Laterality Date  ANTERIOR CERVICAL DECOMP/DISCECTOMY FUSION N/A 12/16/2022  Procedure: CERVICAL THREE FOUR ANTERIOR CERVICAL DISCENTOMY AND FUSION;  Surgeon: Callie Fielding, MD;  Location: [facility];  Service: Orthopedics;  Laterality: N/A;  NO PAST SURGERIES   DeBlois, Katherene Ponto 12/20/2022, 12:03 PM          Blood pressure (!) 149/85, pulse 80, temperature 98.4 F (36.9 C), temperature source Oral, resp. rate 18, height '5\' 5"'$  (1.651 m), weight 44 kg, SpO2 100 %.   Medical Problem List and Plan: 1. Functional deficits secondary to cervical myelopathy with C5 incomplete ASIA D SCI s/p C3/4 ACDF             -patient may  shower- cover incision             -ELOS/Goals: 7-10 days mod I 2.  Antithrombotics: -DVT/anticoagulation:  Pharmaceutical: Lovenox             -antiplatelet therapy: N/a 3. Pain Management: hydrocodone prn.              --will add gabapentin for neuropathic symptoms.   4. Mood/Behavior/Sleep: LCSW to follow for evaluation and support.              -antipsychotic agents: N/A             --Discussed compliance with collar. Melatonin added for insomnia.Don't want to do Trazodone because concern for urinary retention  5. Neuropsych/cognition: This patient is capable of making  decisions on his own behalf. 6. Skin/Wound Care: Routine pressure relief measures.  7. Fluids/Electrolytes/Nutrition: Monitor I/O. Check CMET in am 8. ABLA: Recheck CBC in am 9. Electrolyte abnormalities: Recheck BMET in am to follow up on Sodium and potassium levels. 10. Low calorie malnutrition: Continue ensure bid. 11. Dysphagia: Discussed edema/surgery leading to aspiration risk/PNA --patient does not want diet modified. Went over risks of pneumonia, and spesis, etc- pt emphatic doesn't want nectar thick liquids 12. Neurogenic bladder/Frequency: Will check voiding function with bladder scan for PVRs. - sounds like is automatically double voiding- will start Flomax 0.4 mg qsupper 13. Bowel urgency: Will reduce colace to once daily and monitor. But also feels constipated- will see if needs to be cleaned out?             Bary Leriche, PA-C 12/21/2022   I have personally performed a face to face diagnostic evaluation of this patient and formulated the key components of the plan.  Additionally, I have personally reviewed laboratory data, imaging studies, as well as relevant notes and concur with  the physician assistant's documentation above.   The patient's status has not changed from the original H&P.  Any changes in documentation from the acute care chart have been noted above.

## 2022-12-22 NOTE — Progress Notes (Signed)
Patient ID: Samuel [name], male   DOB: 1961-01-20, 62 y.o.   MRN: [id number] Met with the patient to review current situation, rehab process, team conference and plan of care. Discussed safety precautions, asp. Risk, c-collar x 6 weeks, toileting protocol and pain management. Reviewed health risk for smoking and ETOH use along with high calorie/high protein food options for malnutrition. Continue to follow along to address educational needs to facilitate preparation for discharge. Margarito Liner, RN

## 2022-12-22 NOTE — Progress Notes (Signed)
PMR Admission Coordinator Pre-Admission Assessment   Patient: Samuel [name] is an 62 y.o., male MRN: [id number] DOB: [birth year] Height: '5\' 5"'$  (165.1 cm) Weight: 44 kg   Insurance Information HMO:     PPO:      PCP:      IPA:      80/20:      OTHER:  PRIMARY: U[employer] Medicaid      Policy#: [id number]      Subscriber: pt CM Name: Cacilie Phone#: [phone]     Fax#: [phone] Pre-Cert#: [id number] [place] for CIR provided by Cacilie with [facility] Medicaid with updates due to fax listed above on 3/18      Employer:  Benefits:  Phone #: [phone]     Name:  Eff. Date: 11/10/22     Deduct: $0      Out of Pocket Max: $0      Life Max: n/a CIR: 100%      SNF:  Outpatient:      Co-Pay:  Home Health:       Co-Pay:  DME:      Co-Pay:  Providers:  SECONDARY:       Policy#:      Phone#:    Development worker, community:       Phone#:    [employer] Engineer, [employer]" for patients in Inpatient Rehabilitation Facilities with attached "Privacy Act [place] Records" was provided and verbally reviewed with: N/A   Emergency Contact Information Contact Information       Name Relation Home Work Mobile    [place] Sister     [phone]           Current Medical History  Patient Admitting Diagnosis: cervical myelopathy   History of Present Illness: Pt is a 62 y/o male with PMH of ETOH abuse and pancreatitis with progressive UE weakness, discoordination, and numbness.  Pt had outpatient MRI on 1/30 showing central and bilateral foraminal stenosis at C3-4 with T2 cord signal change, and foraminal stenosis at C4-5 and C7/T1.  XR CT spine on 11/30/22 showed anterior osteophyte at multiple levels with disc height loss at C5/6, C6/7.  He was recommended for operative management and presented to [facility] on 12/16/22 for ACDF of C3-4.  Post op course hyponatremia and ABLA.  Therapy evaluations completed and pt was recommended for CIR.    Patient's medical record from [facility] has been  reviewed by the rehabilitation admission coordinator and physician.   Past Medical History      Past Medical History:  Diagnosis Date   Cervical myelopathy (HCC)     Elevated LFTs     Pancreatitis        Has the patient had major surgery during 100 days prior to admission? Yes   Family History   family history includes Breast cancer in his mother.   Current Medications   Current Facility-Administered Medications:    docusate sodium (COLACE) capsule 100 mg, 100 mg, Oral, BID, Callie Fielding, MD, 100 mg at 12/22/22 0921   enoxaparin (LOVENOX) injection 30 mg, 30 mg, Subcutaneous, Daily, Callie Fielding, MD, 30 mg at 12/22/22 [id number]   feeding supplement (ENSURE SURGERY) liquid 237 mL, 237 mL, Oral, BID BM, Callie Fielding, MD, 237 mL at 12/22/22 [id number]   HYDROcodone-acetaminophen (NORCO/VICODIN) 5-325 MG per tablet 1 tablet, 1 tablet, Oral, Q4H PRN, Callie Fielding, MD, 1 tablet at 12/22/22 0929   ondansetron (ZOFRAN) tablet 4 mg, 4 mg, Oral, Q6H PRN **OR** ondansetron (ZOFRAN)  injection 4 mg, 4 mg, Intravenous, Q6H PRN, Callie Fielding, MD   Patients Current Diet:  Diet Order                  Diet regular Room service appropriate? Yes; Fluid consistency: Thin  Diet effective now                         Precautions / Restrictions Precautions Precautions: Fall, Cervical Precaution Booklet Issued: Yes (comment) Precaution Comments: reviewed precautions. Cervical Brace: Hard collar Restrictions Weight Bearing Restrictions: No Other Position/Activity Restrictions: Continue to reposition brace as pt likes to wear it loose.    Has the patient had 2 or more falls or a fall with injury in the past year? Yes   Prior Activity Level Community (5-7x/wk): Slow decline over the last year, but only requiring assist the last 2 weeks for ADLs, using a rollator most recently prior to admit, does not have a car, does not work   Prior Functional Level Self Care: Did the patient need  help bathing, dressing, using the toilet or eating? Independent   Indoor Mobility: Did the patient need assistance with walking from room to room (with or without device)? Independent   Stairs: Did the patient need assistance with internal or external stairs (with or without device)? Independent   Functional Cognition: Did the patient need help planning regular tasks such as shopping or remembering to take medications? Independent   Patient Information Are you of Hispanic, Latino/a,or Spanish origin?: A. No, not of Hispanic, Latino/a, or Spanish origin What is your race?: B. Black or African American Do you need or want an interpreter to communicate with a doctor or health care staff?: 0. No   Patient's Response To:  Health Literacy and Transportation Is the patient able to respond to health literacy and transportation needs?: Yes Health Literacy - How often do you need to have someone help you when you read instructions, pamphlets, or other written material from your doctor or pharmacy?: Never In the past 12 months, has lack of transportation kept you from medical appointments or from getting medications?: No In the past 12 months, has lack of transportation kept you from meetings, work, or from getting things needed for daily living?: No   Home Assistive Devices / Equipment Home Equipment: Rollator (4 wheels), Shower seat   Prior Device Use: Indicate devices/aids used by the patient prior to current illness, exacerbation or injury? Walker   Current Functional Level Cognition   Overall Cognitive Status: Within Functional Limits for tasks assessed Orientation Level: Oriented X4 General Comments: Pt can state precautions but does not always follow them.  Continues to rotate neck and loosen brace.    Extremity Assessment (includes Sensation/Coordination)   Upper Extremity Assessment: LUE deficits/detail, RUE deficits/detail RUE Deficits / Details: Pt gripping objects easier and using  RUE more for functional tasks. RUE Sensation: decreased light touch, decreased proprioception RUE Coordination: decreased fine motor, decreased gross motor LUE Deficits / Details: Pt with in creased use of LUE during functional tasks. LUE Sensation: decreased light touch LUE Coordination: decreased fine motor, decreased gross motor  Lower Extremity Assessment: Defer to PT evaluation RLE Deficits / Details: hip flexion ~4/5, knee extension ~4+/5, knee flexion ~3+/5, ankle DF ~4/5, PF ~4/5. light touch sensation diminished, more intact on R RLE Sensation: decreased light touch RLE Coordination: decreased gross motor LLE Deficits / Details: hip flexion ~3+/5, knee extension ~4+/5, knee flexion ~3+/5, ankle DF ~  3+/5, PF ~4/5. light touch sensation diminished on L compared to R LLE Sensation: decreased light touch LLE Coordination: decreased gross motor     ADLs   Overall ADL's : Needs assistance/impaired Eating/Feeding: Set up, Sitting Eating/Feeding Details (indicate cue type and reason): Pt doing much better drinking from adaptive cup.  Pt states he still has hard time getting food to his mouth and thinks it is because of neck brace and that he cannot look down to see his food. Raised table up some for pt to eat while also putting elbows on pillows to attempt to have less fatigue when feeding.  Also helps pt tremendously to be OOB in the chair to eat. Grooming: Wash/dry hands, Min guard Grooming Details (indicate cue type and reason): Pt stood to brush teeth and wash hands without LOB at sink after toileting. Upper Body Bathing: Minimal assistance, Sitting Lower Body Bathing: Maximal assistance, Sit to/from stand Upper Body Dressing : Moderate assistance, Cueing for UE precautions Lower Body Dressing: Minimal assistance, Sit to/from stand, Cueing for compensatory techniques Lower Body Dressing Details (indicate cue type and reason): Cues to dress weaker leg first. Pt donned boxers with min  guard to stand and socks with min assist. Toilet Transfer: Minimal assistance, Ambulation, Grab bars, Comfort height toilet Toilet Transfer Details (indicate cue type and reason): Pt walked to bathroom with walker. Pt with no LOB. Cues for hand placement.  Pt then stood to urinate at toilet and needed to let go of grab bar with both hands. Pt stood for appx 2 minutes while having mild tremor while standing.  Pt required min assist to stablize self to urinate while not holding to anything.  Spoke with pt about trying to hold one hand to self and one hand on grab rail. Toileting- Clothing Manipulation and Hygiene: Minimal assistance, Sit to/from stand, Cueing for compensatory techniques Toileting - Clothing Manipulation Details (indicate cue type and reason): min assist to maintain balance when holding to nothing. Functional mobility during ADLs: Minimal assistance, Rolling walker (2 wheels) General ADL Comments: Pt making improvements in all areas. Pt continues to be ataxic with gait when up but fewer LOB.  Pt using BUE more for all adls.     Mobility   Overal bed mobility: Needs Assistance Bed Mobility: Supine to Sit, Sit to Supine Supine to sit: Supervision, HOB elevated Sit to supine: Supervision, HOB elevated General bed mobility comments: Pt demonstrating log roll technique without cues. Use of bedrails and HOB elevated     Transfers   Overall transfer level: Needs assistance Equipment used: Rolling walker (2 wheels) Transfers: Sit to/from Stand Sit to Stand: Min guard Bed to/from chair/wheelchair/BSC transfer type:: Stand pivot Stand pivot transfers: Min guard Step pivot transfers: Min guard General transfer comment: From EOB to RW with min guard for safety. Pt demonstrating safe hand placement. Cues for upright posture     Ambulation / Gait / Stairs / Wheelchair Mobility   Ambulation/Gait Ambulation/Gait assistance: Counsellor (Feet): 190 Feet Assistive device: Rolling  walker (2 wheels) Gait Pattern/deviations: Step-through pattern, Ataxic, Knee flexed in stance - right, Knee flexed in stance - left, Trunk flexed General Gait Details: Pt slightly improving stability this session requiring min guard for safety. Cues for upright posture and RW proximity. pt demonstrating decreased hip flexion during gait trial, however is able to flex hip when marching in place Gait velocity: decreased Pre-gait activities: Marching in place with cues for technique     Posture / Balance  Dynamic Sitting Balance Sitting balance - Comments: sitting EOB Balance Overall balance assessment: Needs assistance Sitting-balance support: Feet supported, No upper extremity supported Sitting balance-Leahy Scale: Good Sitting balance - Comments: sitting EOB Standing balance support: Bilateral upper extremity supported, During functional activity, Reliant on assistive device for balance Standing balance-Leahy Scale: Poor Standing balance comment: with RW support     Special needs/care consideration Skin surgical incision to neck    Previous Home Environment (from acute therapy documentation) Living Arrangements: Alone Available Help at Discharge: Friend(s), Family, Available 24 hours/day Type of Home: Apartment Home Layout: One level Home Access: Level entry Bathroom Shower/Tub: Chiropodist: Standard Bathroom Accessibility: No Additional Comments: one level apt with no STE, friends and family can be available if needed   Discharge Living Setting Plans for Discharge Living Setting: Patient's home, Lives with (comment) (friend, [name], stays with him) Type of Home at Discharge: Apartment Discharge Home Layout: One level Discharge Home Access: Level entry Discharge Bathroom Shower/Tub: Tub/shower unit Discharge Bathroom Toilet: Standard Discharge Bathroom Accessibility: No Does the patient have any problems obtaining your medications?: No   Social/Family/Support  Systems Anticipated Caregiver: mod I goals, contact sister [name] for updates Anticipated Caregiver's Contact Information: [phone] Ability/Limitations of Caregiver: works, not available 24/7 Caregiver Availability: Intermittent Discharge Plan Discussed with Primary Caregiver: Yes Is Caregiver In Agreement with Plan?: Yes Does Caregiver/Family have Issues with Lodging/Transportation while Pt is in Rehab?: No   Goals Patient/Family Goal for Rehab: PT/OT mod I, SLP n/a Expected length of stay: 7-10 days Additional Information: Discharge plan: back to pt's apartmet at mod I level Pt/Family Agrees to Admission and willing to participate: Yes Program Orientation Provided & Reviewed with Pt/Caregiver Including Roles  & Responsibilities: Yes  Barriers to Discharge: Insurance for SNF coverage, Decreased caregiver support   Decrease burden of Care through IP rehab admission: n/a   Possible need for SNF placement upon discharge: Not anticipated.  Pt with mod I goals, plan to discharge to his apartment.    Patient Condition: I have reviewed medical records from [facility], spoken with CM, and patient and family member. I met with patient at the bedside and discussed via phone for inpatient rehabilitation assessment.  Patient will benefit from ongoing PT and OT, can actively participate in 3 hours of therapy a day 5 days of the week, and can make measurable gains during the admission.  Patient will also benefit from the coordinated team approach during an Inpatient Acute Rehabilitation admission.  The patient will receive intensive therapy as well as Rehabilitation physician, nursing, social worker, and care management interventions.  Due to safety, skin/wound care, medication administration, pain management, and patient education the patient requires 24 hour a day rehabilitation nursing.  The patient is currently min assist with mobility and basic ADLs.  Discharge setting and therapy post discharge at  home with home health is anticipated.  Patient has agreed to participate in the Acute Inpatient Rehabilitation Program and will admit today.   Preadmission Screen Completed By:  Michel Santee, PT, DPT 12/22/2022 10:10 AM ______________________________________________________________________   Discussed status with Dr. Dagoberto Ligas on 12/22/22  at 10:10 AM and received approval for admission today.   Admission Coordinator:  Michel Santee, PT, DPT time  10:10 AM Sudie Grumbling 12/22/22     Assessment/Plan: Diagnosis: Does the need for close, 24 hr/day Medical supervision in concert with the patient's rehab needs make it unreasonable for this patient to be served in a less intensive setting? Yes Co-Morbidities  requiring supervision/potential complications: Cervical myelopathy with UE/LE weakness; hyponatremia; dysphagia- refusing dysphagia diet- s/p C3/4 ACDF Due to bladder management, bowel management, safety, skin/wound care, disease management, medication administration, pain management, and patient education, does the patient require 24 hr/day rehab nursing? Yes Does the patient require coordinated care of a physician, rehab nurse, PT, OT, and SLP to address physical and functional deficits in the context of the above medical diagnosis(es)? Yes Addressing deficits in the following areas: balance, endurance, locomotion, strength, transferring, bowel/bladder control, bathing, dressing, feeding, grooming, and toileting Can the patient actively participate in an intensive therapy program of at least 3 hrs of therapy 5 days a week? Yes The potential for patient to make measurable gains while on inpatient rehab is good Anticipated functional outcomes upon discharge from inpatient rehab: modified independent PT, modified independent OT, n/a SLP Estimated rehab length of stay to reach the above functional goals is: 7-10 days Anticipated discharge destination: Home 10. Overall Rehab/Functional Prognosis: good      MD Signature:

## 2022-12-22 NOTE — Progress Notes (Signed)
Orthopedic Surgery Post-operative Progress Note  Assessment: Patient is a 62 y.o. male who is currently admitted after undergoing C3/4 ACDF   Plan: -Operative plans complete -Out of bed as tolerated with aspen collar -No bending/lifting/twisting greater than 10 pounds -PT/OT evaluation and treat -Pain control -Regular diet -DVT ppx: lovenox -Discharge to IPR when bed available  _________________________________________________________________________  Subjective: No acute events overnight. Not having much neck pain at this point. No radiating arm or leg pain. Tolerating oral diet without issue when he is sitting up. Has trouble swallowing when he is laying down or reclined. No change in extremity sensation. Feels balance has slightly improved since surgery and feels stable with his walker.  Objective:  General: no acute distress, appropriate affect Neurologic: alert, answering questions appropriately, following commands Respiratory: unlabored breathing on room air Neck: no hematoma appreciated, collar on properly this morning Skin: dressing clear/dry/intact  MSK (spine):   -Strength exam                                                   Left                  Right Grip strength                3/5                  3/5 Interosseus                  3/5                  3/5 Wrist extension            3/5                  4/5 Wrist flexion                 4/5                  4/5 Elbow extension           4/5                  4/5 Elbow flexion                4/5                  4/5 Deltoid                          4/5                  4/5   EHL                              4/5                  4/5 TA                                 4/5                  4/5 [place]  4/5                  4/5 Knee extension            4/5                  4/5 Knee flexion                 4/5                  4/5 Hip flexion                    4/5                  4/5    -Sensory exam                           Sensation intact to light touch in L3-S1 nerve distributions of bilateral lower extremities (decreased in all distributions)             Sensation intact to light touch in C5-T1 nerve distributions of bilateral upper extremities (decreased in all distributions)   Patient name: Samuel [name] Patient MRN: [id number] Date: 12/22/22

## 2022-12-22 NOTE — Progress Notes (Signed)
Inpatient Rehab Admissions Coordinator:   I have a bed for this patient to admit to CIR today.  TOC and MD aware.  Will let pt/family know.   Shann Medal, PT, DPT Admissions Coordinator [phone] 12/22/22  10:10 AM

## 2022-12-22 NOTE — Plan of Care (Signed)
  Problem: SCI BLADDER ELIMINATION Goal: RH STG MANAGE BLADDER WITH ASSISTANCE Description: STG Manage Bladder With min Assistance Outcome: Progressing;  toilet every 3 hours; bladder scan

## 2022-12-22 NOTE — Progress Notes (Signed)
Inpatient Rehabilitation Admission Medication Review by a Pharmacist  A complete drug regimen review was completed for this patient to identify any potential clinically significant medication issues.  High Risk Drug Classes Is patient taking? Indication by Medication  Antipsychotic Yes Compazine-nausea  Anticoagulant Yes Enoxparin-VTE px  Antibiotic No   Opioid Yes Tramadol, Norco-pain  Antiplatelet No   Hypoglycemics/insulin No   Vasoactive Medication No   Chemotherapy No   Other Yes Docusate, Miralax-constipation Diphenhydramine-itching Guaifenesin-cough Methocarbamol-spasms Tamsulosin-BPH     Type of Medication Issue Identified Description of Issue Recommendation(s)  Drug Interaction(s) (clinically significant)     Duplicate Therapy     Allergy     No Medication Administration End Date     Incorrect Dose     Additional Drug Therapy Needed     Significant med changes from prior encounter (inform family/care partners about these prior to discharge).    Other       Clinically significant medication issues were identified that warrant physician communication and completion of prescribed/recommended actions by midnight of the next day:  No  Name of provider notified for urgent issues identified:   Provider Method of Notification:     Pharmacist comments:   Time spent performing this drug regimen review (minutes):  15   Mike Berntsen A. Levada Dy, PharmD, BCPS, FNKF Clinical Pharmacist  Please utilize Amion for appropriate phone number to reach the unit pharmacist (Lindy)  12/22/2022 12:59 PM

## 2022-12-22 NOTE — Plan of Care (Signed)
Completed/met for discharge  

## 2022-12-22 NOTE — Progress Notes (Signed)
INPATIENT REHABILITATION ADMISSION NOTE   Arrival Method: wheelchair     Mental Orientation: alert  Assessment:done   Skin:done   IV'S:none   Pain:none   Tubes and Drains:none   Safety Measures:done   Vital Signs:done   Height and Weight:done   Rehab Orientation:done   Family:none   Glenis Smoker BSN, RN-BC, WTA Notes:

## 2022-12-23 DIAGNOSIS — E440 Moderate protein-calorie malnutrition: Secondary | ICD-10-CM | POA: Insufficient documentation

## 2022-12-23 LAB — COMPREHENSIVE METABOLIC PANEL
ALT: 16 U/L (ref 0–44)
AST: 23 U/L (ref 15–41)
Albumin: 3.3 g/dL — ABNORMAL LOW (ref 3.5–5.0)
Alkaline Phosphatase: 73 U/L (ref 38–126)
Anion gap: 8 (ref 5–15)
BUN: 9 mg/dL (ref 8–23)
CO2: 25 mmol/L (ref 22–32)
Calcium: 9.2 mg/dL (ref 8.9–10.3)
Chloride: 100 mmol/L (ref 98–111)
Creatinine, Ser: 0.59 mg/dL — ABNORMAL LOW (ref 0.61–1.24)
GFR, Estimated: >60 mL/min (ref >60)
Glucose, Bld: 103 mg/dL — ABNORMAL HIGH (ref 70–99)
Potassium: 3.8 mmol/L (ref 3.5–5.1)
Sodium: 133 mmol/L — ABNORMAL LOW (ref 135–145)
Total Bilirubin: 0.7 mg/dL (ref 0.3–1.2)
Total Protein: 6.7 g/dL (ref 6.5–8.1)

## 2022-12-23 LAB — CBC WITH DIFFERENTIAL/PLATELET
Abs Immature Granulocytes: 0.02 10*3/uL (ref 0.00–0.07)
Basophils Absolute: 0.1 10*3/uL (ref 0.0–0.1)
Basophils Relative: 1 %
Eosinophils Absolute: 0.1 10*3/uL (ref 0.0–0.5)
Eosinophils Relative: 2 %
HCT: 32.1 % — ABNORMAL LOW (ref 39.0–52.0)
Hemoglobin: 10.8 g/dL — ABNORMAL LOW (ref 13.0–17.0)
Immature Granulocytes: 0 %
Lymphocytes Relative: 26 %
Lymphs Abs: 1.6 10*3/uL (ref 0.7–4.0)
MCH: 30 pg (ref 26.0–34.0)
MCHC: 33.6 g/dL (ref 30.0–36.0)
MCV: 89.2 fL (ref 80.0–100.0)
Monocytes Absolute: 1.2 10*3/uL — ABNORMAL HIGH (ref 0.1–1.0)
Monocytes Relative: 19 %
Neutro Abs: 3.2 10*3/uL (ref 1.7–7.7)
Neutrophils Relative %: 52 %
Platelets: 264 10*3/uL (ref 150–400)
RBC: 3.6 MIL/uL — ABNORMAL LOW (ref 4.22–5.81)
RDW: 14.6 % (ref 11.5–15.5)
WBC: 6.3 10*3/uL (ref 4.0–10.5)
nRBC: 0 % (ref 0.0–0.2)

## 2022-12-23 MED ORDER — JUVEN PO PACK
1.0000 | PACK | Freq: Two times a day (BID) | ORAL | Status: DC
  Administered 2022-12-23 – 2022-12-31 (×8): 1 via ORAL
  Filled 2022-12-23 (×11): qty 1

## 2022-12-23 MED ORDER — ACETAMINOPHEN 325 MG PO TABS: 325.0000 mg | ORAL_TABLET | ORAL | Status: DC | PRN

## 2022-12-23 MED ORDER — SORBITOL 70 % SOLN: 45.0000 mL | Freq: Once | Status: DC

## 2022-12-23 NOTE — Progress Notes (Signed)
Occupational Therapy Session Note  Patient Details  Name: Samuel [name] MRN: [id number] Date of Birth: 1961/03/03  Today's Date: 12/23/2022 OT Individual Time: 1230-1315 OT Individual Time Calculation (min): 45 min    Short Term Goals: Week 1:  OT Short Term Goal 1 (Week 1): STG=LTG d/t ELOS  Skilled Therapeutic Interventions/Progress Updates:    Pr greeted sitting upright in bed finishing lunch with nurse tech. Handoff to OT. Pt with coughing episodes after almost every swallow of food items and thin liquids. He was able to use built up handles and self feed with set-up A. Educated on aspiration risk, but patient stated he can swallow just fine. OT issued therab=putty and small beads. We practiced squeezing putty and picking out beads for [facility] Ambulation to bathroom with min A and RW with cues for safety. Successful BM with min A for hygiene. Pt returned to bed and left semi-reclined in bed with needs met.   Therapy Documentation Precautions:  Precautions Precautions: Fall, Cervical Precaution Booklet Issued: Yes (comment) Precaution Comments: reviewed precautions. Required Braces or Orthoses: Cervical Brace Cervical Brace: Hard collar Restrictions Weight Bearing Restrictions: No Other Position/Activity Restrictions: Continue to reposition brace as pt likes to wear it loose.  Pain:  Denies pain   Therapy/Group: Individual Therapy  Valma Cava 12/23/2022, 2:31 PM

## 2022-12-23 NOTE — Progress Notes (Signed)
Inpatient Rehabilitation  Patient information reviewed and entered into eRehab system by Alfrieda Tarry Earnestine Shipp, OTR/L, Rehab Quality Coordinator.   Information including medical coding, functional ability and quality indicators will be reviewed and updated through discharge.   

## 2022-12-23 NOTE — Progress Notes (Signed)
Inpatient Rehabilitation Care Coordinator Assessment and Plan Patient Details  Name: Samuel [name] MRN: [id number] Date of Birth: November 19, 1960  Today's Date: 12/23/2022  Hospital Problems: Principal Problem:   Cervical myelopathy ([place]) Active Problems:   Hyponatremia   TOBACCO ABUSE   Neurogenic bladder  Past Medical History:  Past Medical History:  Diagnosis Date   Alcohol abuse    Cervical myelopathy ([place])    Coma ([place])    was in hospital for 3+ months in the 80's--carbon monoxide poisoning.   Elevated LFTs    Gallbladder polyp    Pancreatitis    Renal cyst, left    Past Surgical History:  Past Surgical History:  Procedure Laterality Date   ANTERIOR CERVICAL DECOMP/DISCECTOMY FUSION N/Samuel 12/16/2022   Procedure: CERVICAL THREE FOUR ANTERIOR CERVICAL DISCENTOMY AND FUSION;  Surgeon: Callie Fielding, MD;  Location: [facility];  Service: Orthopedics;  Laterality: N/Samuel;   NO PAST SURGERIES     Social History:  reports that he has been smoking cigarettes. He has Samuel 22.50 pack-year smoking history. He has never used smokeless tobacco. He reports current alcohol use of about 24.0 standard drinks of alcohol per week. He reports that he does not currently use drugs.  Family / Support Systems Marital Status: Divorced How Long?: 10-15 years Spouse/Significant Other: Divorced Children: 2 adult daughter- Samuel [name] (lives in [place]) and Lakeside (lives in [place]) Other Supports: friend Samuel [name] Anticipated Caregiver: friend Samuel [name] Ability/Limitations of Caregiver: None reported Caregiver Availability: 24/7 Family Dynamics: Pt lives alone, and has Samuel friend Samuel [name] that lives there as  well.  Social History Preferred language: English Religion: Christian Cultural Background: Pt worked at [employer] and [employer] until retirement Education: high school [employer] - How often do you need to have someone help you when you read instructions, pamphlets, or other written material from your doctor or pharmacy?:  Never Writes: Yes Employment Status: Retired Date Retired/Disabled/Unemployed: 2022 Age Retired: 59 Public relations account executive Issues: Pt reports jail time over 30 yrs ago. Denies anything current. Guardian/Conservator: N/Samuel   Abuse/Neglect Abuse/Neglect Assessment Can Be Completed: Yes Physical Abuse: Denies Verbal Abuse: Denies Sexual Abuse: Denies Exploitation of patient/patient's resources: Denies Self-Neglect: Denies  Patient response to: Social Isolation - How often do you feel lonely or isolated from those around you?: Never  Emotional Status Pt's affect, behavior and adjustment status: Pt in good spirits at time of visit Recent Psychosocial Issues: Denies Psychiatric History: Denies Substance Abuse History: Pt admits that he smoked cigarettes daily prior to admission. Was smoking 5-10 per day. Unsure if he would like to quit. Reports occassional etoh use. Denies rec drug use.  Patient / Family Perceptions, Expectations & Goals Pt/Family understanding of illness & functional limitations: Pt has Samuel general understanding of pt care needs Premorbid pt/family roles/activities: Independent Anticipated changes in roles/activities/participation: Assistance with ADLs/IADLs Pt/family expectations/goals: Pt goal is to work on getting out of here, and get feeling back in hands and feet.  Community Resources Express Scripts: None Premorbid Home Care/DME Agencies: None Transportation available at discharge: TBD Is the patient able to respond to transportation needs?: Yes In the past 12 months, has lack of transportation kept you from medical appointments or from getting medications?: No In the past 12 months, has lack of transportation kept you from meetings, work, or from getting things needed for daily living?: No Resource referrals recommended: Neuropsychology  Discharge Planning Living Arrangements: Non-relatives/Friends Support Systems: Friends/neighbors Type of Residence:  Private residence Insurance Resources: Multimedia programmer (specify) ([place] Medicaid  [place]) Pensions consultant: Radio broadcast assistant Screen Referred: No Living Expenses: Education officer, community Management: Patient Does the patient have any problems obtaining your medications?: No Home Management: Pt manages all homecare needs Patient/Family Preliminary Plans: TBD Care Coordinator Barriers to Discharge: Insurance for SNF coverage Care Coordinator Anticipated Follow Up Needs: HH/OP  Clinical Impression SW met with pt in room at bedside to introduce self, explain role, and discuss discharge process. Pt is not Samuel English as Samuel second language teacher. No HCPOA. DME: rollator, shower chair, and handheld shower head. Pt aware SW will follow-up with his sister Samuel [name].   [id number] left message for pt sister Samuel [name] to introduce self, explain role, and discuss discharge process. SW informed there will be follow-up with updates after team conference on Tuesday.   Samuel [name] Samuel [name] 12/23/2022, 2:01 PM

## 2022-12-23 NOTE — Progress Notes (Signed)
PROGRESS NOTE   Subjective/Complaints:  Pt reports neck hurting- trying to get comfortable.  Constipated still-  Bladder scans low.   Trying to keep comfortable.  Willing to do Sorbitol-    ROS:  Pt denies SOB, abd pain, CP, N/V/C/D, and vision changes   Objective:   No results found. Recent Labs    12/23/22 0611  WBC 6.3  HGB 10.8*  HCT 32.1*  PLT 264   Recent Labs    12/23/22 0611  NA 133*  K 3.8  CL 100  CO2 25  GLUCOSE 103*  BUN 9  CREATININE 0.59*  CALCIUM 9.2    Intake/Output Summary (Last 24 hours) at 12/23/2022 1633 Last data filed at 12/23/2022 1532 Gross per 24 hour  Intake 780 ml  Output 475 ml  Net 305 ml        Physical Exam: Vital Signs Blood pressure (!) 143/71, pulse 65, temperature 98.6 F (37 C), temperature source Oral, resp. rate 17, height 5\' 5"  (1.651 m), weight 41.8 kg, SpO2 98 %.   General: awake, alert, appropriate, curled up in bed- frail appearing; cachetic; NAD HENT: conjugate gaze; oropharynx moist- wearing cervical collar CV: regular rate; no JVD Pulmonary: CTA B/L; no W/R/R- good air movement GI: soft, NT, somewhat distended- hypoactive BS Psychiatric: appropriate Neurological: Ox3 Musculoskeletal:     Comments: UE strength- biceps 4+/5; WE 4+ on R and 4/5 on L; triceps 4/5; Grip 3+/5; and FA 3+/5 B/L LE strength- HF 4/5; KE 4+/5; DF 4/5; and PF 4+/5 B/L   Skin:    General: Skin is warm and dry.     Comments: Cervical incision per neck Bruising and scabs between legs from prior use of brief  Neurological:     Mental Status: He is alert and oriented to person, place, and time.     Comments: Soft voice with moderate dysarthria and slurring at times. He was able to follow simple commands without difficulty. BUE weakness with sensory deficits. BLE with significant ataxia.  C6 sensory level decreased to light touch down to T2- torso is intact per pt- but L1-S4 is  decreased to light touch   Assessment/Plan: 1. Functional deficits which require 3+ hours per day of interdisciplinary therapy in a comprehensive inpatient rehab setting. Physiatrist is providing close team supervision and 24 hour management of active medical problems listed below. Physiatrist and rehab team continue to assess barriers to discharge/monitor patient progress toward functional and medical goals  Care Tool:  Bathing    Body parts bathed by patient: Right arm, Left arm, Chest, Abdomen, Front perineal area, Buttocks, Right upper leg, Left upper leg, Right lower leg, Left lower leg, Face         Bathing assist Assist Level: Minimal Assistance - Patient > 75%     Upper Body Dressing/Undressing Upper body dressing   What is the patient wearing?: Pull over shirt, Orthosis Orthosis activity level: Performed by helper  Upper body assist Assist Level: Minimal Assistance - Patient > 75%    Lower Body Dressing/Undressing Lower body dressing      What is the patient wearing?: Pants     Lower body assist Assist for lower  body dressing: Moderate Assistance - Patient 50 - 74%     Toileting Toileting    Toileting assist Assist for toileting: Minimal Assistance - Patient > 75%     Transfers Chair/bed transfer  Transfers assist     Chair/bed transfer assist level: Moderate Assistance - Patient 50 - 74%     Locomotion Ambulation   Ambulation assist      Assist level: Moderate Assistance - Patient 50 - 74% Assistive device: No Device Max distance: 100'   Walk 10 feet activity   Assist     Assist level: Moderate Assistance - Patient - 50 - 74% Assistive device: No Device   Walk 50 feet activity   Assist    Assist level: Moderate Assistance - Patient - 50 - 74% Assistive device: No Device    Walk 150 feet activity   Assist Walk 150 feet activity did not occur: Safety/medical concerns         Walk 10 feet on uneven surface   activity   Assist     Assist level: Moderate Assistance - Patient - 50 - 74% Assistive device: Other (comment) (Hand rail)   Wheelchair     Assist Is the patient using a wheelchair?: Yes Type of Wheelchair: Manual    Wheelchair assist level: Dependent - Patient 0% (Only used as transport chair) Max wheelchair distance: 150'    Wheelchair 50 feet with 2 turns activity    Assist        Assist Level: Dependent - Patient 0%   Wheelchair 150 feet activity     Assist      Assist Level: Dependent - Patient 0%   Blood pressure (!) 143/71, pulse 65, temperature 98.6 F (37 C), temperature source Oral, resp. rate 17, height 5\' 5"  (1.651 m), weight 41.8 kg, SpO2 98 %.  Medical Problem List and Plan: 1. 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  Antithrombotics: -DVT/anticoagulation:  Pharmaceutical: Lovenox             -antiplatelet therapy: N/a 3. Pain Management: hydrocodone prn.              --will add gabapentin for neuropathic symptoms.    3/15- says mainly taking tylenol- since hx of addiction- trying to avoid.  4. Mood/Behavior/Sleep: LCSW to follow for evaluation and support.              -antipsychotic agents: N/A             --Discussed compliance with collar. Melatonin added for insomnia.Don't want to do Trazodone because concern for urinary retention  5. Neuropsych/cognition: This patient is capable of making decisions on his own behalf. 6. Skin/Wound Care: Routine pressure relief measures.  7. Fluids/Electrolytes/Nutrition: Monitor I/O. Check CMET in am 8. ABLA: Recheck CBC in am  3/15- Hb stable at 10.6 9. Electrolyte abnormalities: Recheck BMET in am to follow up on Sodium and potassium levels. 10. Low calorie malnutrition: Continue ensure bid. 11.  Dysphagia: Discussed edema/surgery leading to aspiration risk/PNA --patient does not want diet modified. Went over risks of pneumonia, and spesis, etc- pt emphatic doesn't want nectar thick liquids 12. Neurogenic bladder/Frequency: Will check voiding function with bladder scan  for PVRs. - sounds like is automatically double voiding- will start Flomax 0.4 mg qsupper  3/15- PVR's 0-6cc- so doing well 13. Bowel urgency with constipation: Will reduce colace to once daily and monitor. But also feels constipated- will see if needs to be cleaned out?  3/15- will give sorbitol- since pt feels constipated- 45 cc- doesn't want enema or Suppository- did have large BM      I spent a total of 36   minutes on total care today- >50% coordination of care- due to  Review of all B/B issues- d/w nursing about pain and B/B.       LOS: 1 days A FACE TO FACE EVALUATION WAS PERFORMED  Samuel [name] 12/23/2022, 4:33 PM

## 2022-12-23 NOTE — Progress Notes (Signed)
Occupational Therapy Assessment and Plan  Patient Details  Name: Samuel [name] MRN: [id number] Date of Birth: 1961-03-04  OT Diagnosis: {diagnoses:3041644} Rehab Potential:   ELOS: 7-10   Today's Date: 12/23/2022 OT Individual Time: [id number] OT Individual Time Calculation (min): 75 min     Hospital Problem: Principal Problem:   Cervical myelopathy ([place]) Active Problems:   Hyponatremia   TOBACCO ABUSE   Neurogenic bladder   Past Medical History:  Past Medical History:  Diagnosis Date   Alcohol abuse    Cervical myelopathy ([place])    Coma ([place])    was in hospital for 3+ months in the 80's--carbon monoxide poisoning.   Elevated LFTs    Gallbladder polyp    Pancreatitis    Renal cyst, left    Past Surgical History:  Past Surgical History:  Procedure Laterality Date   ANTERIOR CERVICAL DECOMP/DISCECTOMY FUSION N/A 12/16/2022   Procedure: CERVICAL THREE FOUR ANTERIOR CERVICAL DISCENTOMY AND FUSION;  Surgeon: Callie Fielding, MD;  Location: [facility];  Service: Orthopedics;  Laterality: N/A;   NO PAST SURGERIES      Assessment & Plan Clinical Impression: Patient is a 62 y.o. year old male with recent admission to the hospital on *** with ***.  Patient transferred to CIR on 12/22/2022 .    Patient currently requires [id number] with [id number] secondary to {impairments:3041632}.  Prior to hospitalization, patient could complete *** with [id number].  Patient will benefit from skilled intervention to {benefit of skilled intervention:3041641} prior to discharge {discharge:3041642}.  Anticipate patient will require {supervision/assistance:22779} and {follow PV:4045953.  OT - End of Session Activity Tolerance: Tolerates 30+ min activity without fatigue OT Assessment OT Barriers to Discharge: Decreased caregiver support OT Patient demonstrates impairments in the following area(s): Balance;Endurance;Sensory;Pain;Motor OT Basic ADL's Functional Problem(s):  Grooming;Bathing;Dressing;Toileting OT Advanced ADL's Functional Problem(s): Simple Meal Preparation OT Transfers Functional Problem(s): Toilet;Tub/Shower OT Additional Impairment(s): Fuctional Use of Upper Extremity OT Plan OT Intensity: Minimum of 1-2 x/day, 45 to 90 minutes OT Frequency: 5 out of 7 days OT Duration/Estimated Length of Stay: 7-10 OT Treatment/Interventions: Balance/vestibular training;DME/adaptive equipment instruction;Patient/family education;Therapeutic Activities;Wheelchair propulsion/positioning;Therapeutic Exercise;Psychosocial support;Functional electrical stimulation;Community reintegration;Functional mobility training;Self Care/advanced ADL retraining;UE/LE Strength taining/ROM;UE/LE Coordination activities;Neuromuscular re-education;Skin care/wound managment;Discharge planning;Disease mangement/prevention;Pain management;Visual/perceptual remediation/compensation;Splinting/orthotics OT Self Feeding Anticipated Outcome(s): MOD I OT Basic Self-Care Anticipated Outcome(s): MOD I OT Toileting Anticipated Outcome(s): MOD I OT Bathroom Transfers Anticipated Outcome(s): MOD I toilet, S shower OT Recommendation Patient destination: Home Follow Up Recommendations: Home health OT;Outpatient OT Equipment Recommended: To be determined   OT Evaluation Precautions/Restrictions  Precautions Precautions: Fall;Cervical Precaution Booklet Issued: Yes (comment) Precaution Comments: reviewed precautions. Required Braces or Orthoses: Cervical Brace Cervical Brace: Hard collar Restrictions Weight Bearing Restrictions: No Other Position/Activity Restrictions: Continue to reposition brace as pt likes to wear it loose. General Chart Reviewed: Yes Family/Caregiver Present: No Vital Signs  Pain Pain Assessment Pain Score: 0-No pain Home Living/Prior Functioning Home Living Family/patient expects to be discharged to:: Private residence Living Arrangements: Alone Available  Help at Discharge: Family, Friend(s) Type of Home: Apartment Home Access: Level entry Home Layout: One level Bathroom Shower/Tub: Tub/shower unit, Air cabin crew Accessibility: Yes Additional Comments: one level apt with no STE, friends and family can be available if needed IADL History Homemaking Responsibilities: Yes Meal Prep Responsibility: Secondary Current License: Yes Prior Function Level of Independence: Requires assistive device for independence (used rollator for about 2 weeks prior to hospital) Driving: Yes Vision Baseline Vision/History: 1 Wears glasses Ability to See in Adequate  Light: 0 Adequate Patient Visual Report: No change from baseline Vision Assessment?: No apparent visual deficits Perception  Perception: Within Functional Limits Praxis Praxis: Intact Cognition Cognition Overall Cognitive Status: Within Functional Limits for tasks assessed Arousal/Alertness: Lethargic Orientation Level: Person;Place;Situation Person: Oriented Place: Oriented Situation: Oriented Awareness: Appears intact Problem Solving: Appears intact Safety/Judgment: Appears intact Brief Interview for Mental Status (BIMS) Repetition of Three Words (First Attempt): 3 Temporal Orientation: Year: Correct Temporal Orientation: Month: Accurate within 5 days Temporal Orientation: Day: Correct Recall: "Sock": Yes, no cue required Recall: "Blue": Yes, no cue required Recall: "Bed": Yes, no cue required BIMS Summary Score: 15 Sensation Sensation Light Touch: Impaired by gross assessment Coordination Gross Motor Movements are Fluid and Coordinated: No Fine Motor Movements are Fluid and Coordinated: No Finger Nose Finger Test: slow and deliberate d/t coordination/motor v visionual perception Motor  Motor Motor: Hemiplegia Motor - Skilled Clinical Observations: gross generalized weakness, L weaker than R  Trunk/Postural Assessment  Cervical  Assessment Cervical Assessment: Exceptions to WFL (C collar) Thoracic Assessment Thoracic Assessment: Within Functional Limits Lumbar Assessment Lumbar Assessment: Within Functional Limits Postural Control Postural Control: Deficits on evaluation (delayed/decreased)  Balance   Extremity/Trunk Assessment RUE Assessment RUE Assessment: Exceptions to [facility] General Strength Comments: generalized weakenss, full ROM, decreased coordination LUE Assessment LUE Assessment: Exceptions to [facility] General Strength Comments: generalized weakness more than L  Care Tool Care Tool Self Care Eating   Eating Assist Level: Set up assist    Oral Care    Oral Care Assist Level: Minimal Assistance - Patient > 75%    Bathing   Body parts bathed by patient: Right arm;Left arm;Chest;Abdomen;Front perineal area;Buttocks;Right upper leg;Left upper leg;Right lower leg;Left lower leg;Face     Assist Level: Minimal Assistance - Patient > 75%    Upper Body Dressing(including orthotics)   What is the patient wearing?: Pull over shirt;Orthosis Orthosis activity level: Performed by helper Assist Level: Minimal Assistance - Patient > 75%    Lower Body Dressing (excluding footwear)   What is the patient wearing?: Pants Assist for lower body dressing: Moderate Assistance - Patient 50 - 74%    Putting on/Taking off footwear   What is the patient wearing?: Non-skid slipper socks Assist for footwear: Maximal Assistance - Patient 25 - 49%       Care Tool Toileting Toileting activity   Assist for toileting: Minimal Assistance - Patient > 75%     Care Tool Bed Mobility Roll left and right activity        Sit to lying activity        Lying to sitting on side of bed activity         Care Tool Transfers Sit to stand transfer        Chair/bed transfer         Toilet transfer   Assist Level: Minimal Assistance - Patient > 75%     Care Tool Cognition  Expression of Ideas and Wants     Understanding Verbal and Non-Verbal Content     Memory/Recall Ability     Refer to Care Plan for Long Term Goals  SHORT TERM GOAL WEEK 1 OT Short Term Goal 1 (Week 1): STG=LTG d/t ELOS  Recommendations for other services: {RECOMMENDATIONS FOR OTHER SERVICES:3049016}   Skilled Therapeutic Intervention ADL ADL Eating: Supervision/safety Where Assessed-Eating: Chair (red handles) Grooming: Minimal assistance Where Assessed-Grooming: Standing at sink Upper Body Bathing: Supervision/safety Where Assessed-Upper Body Bathing: Shower Lower Body Bathing: Minimal assistance Where Assessed-Lower Body Bathing:  Shower Upper Body Dressing: Minimal assistance Where Assessed-Upper Body Dressing: Edge of bed Lower Body Dressing: Moderate assistance Where Assessed-Lower Body Dressing: Edge of bed Toileting: Minimal assistance Where Assessed-Toileting: Glass blower/designer: Psychiatric nurse Method: Psychologist, educational: Environmental education officer Method: Stand pivot Mobility  Transfers Sit to Stand: Minimal Assistance - Patient > 75% Stand to Sit: Minimal Assistance - Patient > 75%  1:1. Pt educated on OT role/purpose, CIR, ELOS, and SCI recovery. Pt completes BADL at shower level with MIN A overall form mobility and up to MOD A for LB ADLs. MAX cuing needed for maintianing cervical precations in shower since able to doff brace. May be best from now on to keep on as pt is impulsive with head movements with bathing. Pt completes box and blocks assessment at EOB. RUE 31, LUE 20. Exited session with pt seated in bed, exit alarm on and call light in reach  Discharge Criteria: Patient will be discharged from OT if patient refuses treatment 3 consecutive times without medical reason, if treatment goals not met, if there is a change in medical status, if patient makes no progress towards goals or if patient is discharged from hospital.  The above  assessment, treatment plan, treatment alternatives and goals were discussed and mutually agreed upon: {Assessment/Treatment Plan Discussed/Agreed:3049017}  Tonny Branch 12/23/2022, 10:31 AM

## 2022-12-23 NOTE — Evaluation (Signed)
Physical Therapy Assessment and Plan  Patient Details  Name: Samuel [name] MRN: [id number] Date of Birth: 03/28/1961  PT Diagnosis: Ataxic gait, Difficulty walking, Impaired sensation, and Muscle weakness Rehab Potential: Good ELOS: 7-10 days   Today's Date: 12/23/2022 PT Individual Time: 1031-1129 PT Individual Time Calculation (min): 58 min    Hospital Problem: Principal Problem:   Cervical myelopathy ([place]) Active Problems:   Hyponatremia   TOBACCO ABUSE   Neurogenic bladder   Past Medical History:  Past Medical History:  Diagnosis Date   Alcohol abuse    Cervical myelopathy ([place])    Coma ([place])    was in hospital for 3+ months in the 80's--carbon monoxide poisoning.   Elevated LFTs    Gallbladder polyp    Pancreatitis    Renal cyst, left    Past Surgical History:  Past Surgical History:  Procedure Laterality Date   ANTERIOR CERVICAL DECOMP/DISCECTOMY FUSION N/A 12/16/2022   Procedure: CERVICAL THREE FOUR ANTERIOR CERVICAL DISCENTOMY AND FUSION;  Surgeon: Callie Fielding, MD;  Location: [facility];  Service: Orthopedics;  Laterality: N/A;   NO PAST SURGERIES      Assessment & Plan Clinical Impression: Patient is a 62 year old R handed male with history of chronic pancreatitis with diarrhea, GB polyps, abnormal LFTs, ETOH/Cocaine abuse in the  past (last (+) cocaine result 2012), 3 year history of numbness/tingling bilateral hands with progressive weakness, decrease in dexterity and now with inability to feel his hands with numbness extending to forearms, difficulty feeding himself and numbness BLE. 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 He was evaluated by Dr. Laurance Flatten who recommended surgical intervention due to cervical  myelopathy. Patient underwent ACDF C3/C4 on 12/16/22.    Post op, ABLA being monitored and transient hyponatremia resolved and  Hypokalemia treated with Kdur X 1. He continues on Ensure for evidence of low calorie malnutrition.  ST consulted due to reports of cough with sips of liquids as well as need to expectorate solids at times. MBS done 03/12 showing observable edema posterior pharyngeal wall restricting epiglottic inversion with residue and aspiration and spillage of solids with penetration to vocal cords. Nectar thick liquid diet recommended with aspiration precautions due to "increase risk of adverse event". ST felt that dysphagia should improve as edema decreases but patient refused dietary modification and risks of aspiration discussed by surgeon per records therefore ST signed off.  He is to continue to wear collar at all times X 6 weeks but may remove for shower.    PTA- patient has been staying with friend for past 3 months. He has was independent but has had significant decline in past 2-3 months, and in the past few weeks has had multiple falls and  required assistance with mobility and ADLs. Sister/nephew have also been checking in on him.  PT/OT has been working with patient who continues to be limited by BUE weakness, ataxia w/balance deficits, difficulty swallowing but declined modified diet and has poor safety awareness.  Patient transferred to CIR on 12/22/2022 .   Patient currently requires min with mobility secondary to muscle weakness, decreased cardiorespiratoy endurance, ataxia, and decreased sitting balance, decreased standing balance, decreased postural control, decreased balance strategies, and difficulty maintaining precautions.  Prior to hospitalization, patient was independent  with mobility and lived  with   in a Apartment home.  Home access is  Level entry.  Patient will benefit from skilled PT intervention to maximize safe functional mobility, minimize fall risk, and decrease  caregiver burden for planned discharge home with 24 hour supervision.  Anticipate patient will benefit from follow up OP at discharge.      PT Evaluation Precautions/Restrictions Precautions Precautions: Fall;Cervical Precaution Booklet Issued: Yes (comment) Precaution Comments: reviewed precautions. Required Braces or Orthoses: Cervical Brace Cervical Brace: Hard collar Restrictions Weight Bearing Restrictions: No Other Position/Activity Restrictions: Continue to reposition brace as pt likes to wear it loose. General Chart Reviewed: Yes Family/Caregiver Present: No  Pain Pain Assessment Pain Score: 0-No pain Pain Interference Pain Interference Pain Effect on Sleep: 1. Rarely or not at all Pain Interference with Therapy Activities: 1. Rarely or not at all Pain Interference with Day-to-Day Activities: 1. Rarely or not at all Home Living/Prior [place] Available Help at Discharge: Family;Friend(s) Type of Home: Apartment Home Access: Level entry Home Layout: One level Bathroom Shower/Tub: Tub/shower unit;Curtain Technical brewer Accessibility: Yes Additional Comments: one level apt with no STE, friends and family can be available if needed Prior Function Level of Independence: Requires assistive device for independence (used rollator for about 2 weeks prior to hospital) Driving: Yes Vision/Perception  Vision - History Ability to See in Adequate Light: 0 Adequate Perception Perception: Within Functional Limits Praxis Praxis: Intact  Cognition Overall Cognitive Status: Within Functional Limits for tasks assessed Arousal/Alertness: Lethargic Awareness: Appears intact Problem Solving: Appears intact Safety/Judgment: Appears intact Sensation Sensation Light Touch: Impaired by gross assessment Coordination Gross Motor Movements are Fluid and Coordinated: No Fine Motor Movements are Fluid and Coordinated: No Finger Nose Finger Test:  slow and deliberate d/t coordination/motor v visionual perception Motor  Motor Motor: Hemiplegia Motor - Skilled Clinical Observations: gross generalized weakness, L weaker than R   Trunk/Postural Assessment  Cervical Assessment Cervical Assessment: Exceptions to WFL (C collar) Thoracic Assessment Thoracic Assessment: Within Functional Limits Lumbar Assessment Lumbar Assessment: Within Functional Limits Postural Control Postural Control: Deficits on evaluation (delayed/decreased)  Balance Balance Balance Assessed: Yes Static Sitting Balance Static Sitting - Balance Support: Feet supported Static Sitting - Level of Assistance: 5: Stand by assistance Dynamic Sitting Balance Dynamic Sitting - Balance Support: Feet supported Dynamic Sitting - Level of Assistance: 4: Min assist Theatre stage manager Standing - Balance Support: During functional activity Static Standing - Level of Assistance: 4: Min assist Dynamic Standing Balance Dynamic Standing - Balance Support: During functional activity Dynamic Standing - Level of Assistance: 3: Mod assist Extremity Assessment  RUE Assessment RUE Assessment: Exceptions to [facility] General Strength Comments: generalized weakenss, full ROM, decreased coordination LUE Assessment LUE Assessment: Exceptions to [facility] General Strength Comments: generalized weakness more than L RLE Assessment RLE Assessment: Exceptions to [facility] General Strength Comments: Hips grossly 4/5, otherwise WFL LLE Assessment LLE Assessment: Exceptions to [facility] General Strength Comments: Hip grossly 3+/5, otherwise [facility]  Care Tool Care Tool Bed Mobility Roll left and right activity   Roll left and right assist level: Supervision/Verbal cueing    Sit to lying activity   Sit to lying assist level: Minimal Assistance - Patient > 75%    Lying to sitting on side of bed activity   Lying to sitting on side of bed assist level: the ability to move from lying on the back to  sitting on the side of the bed with no back support.: Minimal Assistance - Patient > 75%  Care Tool Transfers Sit to stand transfer   Sit to stand assist level: Moderate Assistance - Patient 50 - 74%    Chair/bed transfer   Chair/bed transfer assist level: Moderate Assistance - Patient 50 - 74%     Toilet transfer   Assist Level: Minimal Assistance - Patient > 75%    Car transfer          Care Tool Locomotion Ambulation   Assist level: Moderate Assistance - Patient 50 - 74% Assistive device: No Device Max distance: 100'  Walk 10 feet activity   Assist level: Moderate Assistance - Patient - 50 - 74% Assistive device: No Device   Walk 50 feet with 2 turns activity   Assist level: Moderate Assistance - Patient - 50 - 74% Assistive device: No Device  Walk 150 feet activity Walk 150 feet activity did not occur: Safety/medical concerns      Walk 10 feet on uneven surfaces activity   Assist level: Moderate Assistance - Patient - 50 - 74% Assistive device: Other (comment) (Hand rail)  Stairs   Assist level: Minimal Assistance - Patient > 75% Stairs assistive device: 2 hand rails Max number of stairs: 12  Walk up/down 1 step activity   Walk up/down 1 step (curb) assist level: Minimal Assistance - Patient > 75% Walk up/down 1 step or curb assistive device: 2 hand rails  Walk up/down 4 steps activity   Walk up/down 4 steps assist level: Minimal Assistance - Patient > 75% Walk up/down 4 steps assistive device: 2 hand rails  Walk up/down 12 steps activity   Walk up/down 12 steps assist level: Minimal Assistance - Patient > 75% Walk up/down 12 steps assistive device: 2 hand rails  Pick up small objects from floor   Pick up small object from the floor assist level: Minimal Assistance - Patient > 75%    Wheelchair Is the patient using a wheelchair?: Yes Type of Wheelchair: Manual   Wheelchair assist level: Dependent - Patient 0% (Only used as transport chair) Max wheelchair  distance: 150'  Wheel 50 feet with 2 turns activity   Assist Level: Dependent - Patient 0%  Wheel 150 feet activity   Assist Level: Dependent - Patient 0%    Refer to Care Plan for [place] 1 PT Short Term Goal 1 (Week 1): STGs = LTGs  Recommendations for other services: None   Skilled Therapeutic Intervention  Evaluation completed (see details above and below) with education on PT POC and goals and individual treatment initiated with focus on bed mobility, balance, transfers, ambulation, and stair training. Pt received semi reclined in bed and agrees to therapy. No complaint of pain. Supine to sit with cues for logrolling and minA. Pt performs sit to stand and stand step transfer to [facility] with modA and no AD with cues for sequencing. WC transport to gym. Pt completes car transfer and ramp navigation with modA and cues for positioning and sequencing. Following rest break, pt ambulates x100' without AD, requiring modA and with notable ataxic gait pattern and postural instability. PT provides cues for upright gaze to improve balance, and symmetrical reciprocal gait pattern. Following rest break, pt completes x12 6" steps with bilateral hand rails and minA. PT provides pt with a RW and pt able to ambulate x200' with RW with CGA/minA and cues for safe AD management, decreasing WB through RW for energy conservation and body mechanics. WC transport back to room. Pt left seated in WC  with all needs within reach.   Mobility Transfers Transfers: Sit to Stand;Stand to Sit;Stand Pivot Transfers Sit to Stand: Minimal Assistance - Patient > 75% Stand to Sit: Minimal Assistance - Patient > 75% Stand Pivot Transfers: Minimal Assistance - Patient > 75% Stand Pivot Transfer Details: Manual facilitation for weight shifting;Manual facilitation for weight bearing Transfer (Assistive device): None Locomotion  Gait Ambulation: Yes Gait Assistance: Moderate Assistance - Patient  50-74% Gait Distance (Feet): 100 Feet Assistive device: None Gait Assistance Details: Verbal cues for gait pattern;Verbal cues for sequencing;Tactile cues for posture Gait Gait: Yes Gait Pattern: Impaired Gait Pattern: Ataxic Gait velocity: decreased Stairs / Additional Locomotion Stairs: Yes Stairs Assistance: Minimal Assistance - Patient > 75% Stair Management Technique: Two rails Number of Stairs: 12 Height of Stairs: 6 Ramp: Moderate Assistance - Patient 50 - 74% Curb: Minimal Assistance - Patient >75% Wheelchair Mobility Wheelchair Mobility: No   Discharge Criteria: Patient will be discharged from PT if patient refuses treatment 3 consecutive times without medical reason, if treatment goals not met, if there is a change in medical status, if patient makes no progress towards goals or if patient is discharged from hospital.  The above assessment, treatment plan, treatment alternatives and goals were discussed and mutually agreed upon: by patient  Breck Coons, PT, DPT 12/23/2022, 5:02 PM

## 2022-12-23 NOTE — Progress Notes (Signed)
Initial Nutrition Assessment  DOCUMENTATION CODES:   Non-severe (moderate) malnutrition in context of chronic illness  INTERVENTION:  - Continue Ensure Surgery BID. Provides 330 kcals, 18 gm protein.   NUTRITION DIAGNOSIS:   Moderate Malnutrition related to chronic illness as evidenced by moderate fat depletion, moderate muscle depletion.  GOAL:   Patient will meet greater than or equal to 90% of their needs  MONITOR:   PO intake  REASON FOR ASSESSMENT:   Malnutrition Screening Tool    ASSESSMENT:   62 y.o. male admits to inpatient rehab related to functional decline due to cervical myelopathy. PMH includes: elevated LFTs, pancreatitis.  Meds reviewed: colace. Labs reviewed: Na low.   Pt reports that he has been eating well since admission. He states that he has a good appetite and was eating well PTA as well. He also reports that he has been drinking 2-3 Ensure per day. He states that he has been trying to gain weight. Pt is meeting his needs at this time. RD will continue to monitor PO intakes.   NUTRITION - FOCUSED PHYSICAL EXAM:  Flowsheet Row Most Recent Value  Orbital Region Moderate depletion  Upper Arm Region Moderate depletion  Thoracic and Lumbar Region Unable to assess  Buccal Region Moderate depletion  Temple Region Moderate depletion  Clavicle Bone Region Moderate depletion  Clavicle and Acromion Bone Region Moderate depletion  Scapular Bone Region Moderate depletion  Dorsal Hand Moderate depletion  Patellar Region Moderate depletion  Anterior Thigh Region Moderate depletion  Posterior Calf Region Moderate depletion  Edema (RD Assessment) None  Hair Reviewed  Eyes Reviewed  Mouth Reviewed  Skin Reviewed  Nails Reviewed       Diet Order:   Diet Order             Diet regular Room service appropriate? Yes; Fluid consistency: Thin  Diet effective now                   EDUCATION NEEDS:   Not appropriate for education at this  time  Skin:  Skin Assessment: Reviewed RN Assessment  Last BM:  3/14  Height:   Ht Readings from Last 1 Encounters:  12/22/22 5\' 5"  (1.651 m)    Weight:   Wt Readings from Last 1 Encounters:  12/22/22 41.8 kg    Ideal Body Weight:     BMI:  Body mass index is 15.33 kg/m.  Estimated Nutritional Needs:   Kcal:  [id number] kcals  Protein:  60-75 gm  Fluid:  >/= 1.2 L  Thalia Bloodgood, RD, LDN, CNSC.

## 2022-12-24 MED ORDER — POLYETHYLENE GLYCOL 3350 17 G PO PACK
17.0000 g | PACK | Freq: Every day | ORAL | Status: DC
  Administered 2022-12-28 – 2022-12-30 (×3): 17 g via ORAL
  Filled 2022-12-24 (×6): qty 1

## 2022-12-24 MED ORDER — DOCUSATE SODIUM 100 MG PO CAPS
100.0000 mg | ORAL_CAPSULE | Freq: Two times a day (BID) | ORAL | Status: DC
  Administered 2022-12-24 – 2022-12-29 (×6): 100 mg via ORAL
  Filled 2022-12-24 (×10): qty 1

## 2022-12-24 NOTE — Progress Notes (Signed)
PROGRESS NOTE   Subjective/Complaints:  Review sorbitol yesterday, had small BM, hard per documentation.  Willing to increase bowel regimen today, reattempt sorbitol in 2 to 3 days if needed.  No other concerns, complaints.  ROS:  Pt denies SOB, abd pain, CP, N/V/C/D, and vision changes   Objective:   No results found. Recent Labs    12/23/22 0611  WBC 6.3  HGB 10.8*  HCT 32.1*  PLT 264    Recent Labs    12/23/22 0611  NA 133*  K 3.8  CL 100  CO2 25  GLUCOSE 103*  BUN 9  CREATININE 0.59*  CALCIUM 9.2     Intake/Output Summary (Last 24 hours) at 12/24/2022 1645 Last data filed at 12/24/2022 0830 Gross per 24 hour  Intake 476 ml  Output 1525 ml  Net -1049 ml         Physical Exam: Vital Signs Blood pressure 130/76, pulse 60, temperature 97.6 F (36.4 C), resp. rate 16, height 5\' 5"  (1.651 m), weight 41.8 kg, SpO2 100 %.   General: awake, alert, appropriate, curled up in bed- frail appearing; cachetic; NAD HENT: conjugate gaze; oropharynx moist- wearing cervical collar CV: regular rate; no JVD Pulmonary: CTA B/L; no W/R/R- good air movement GI: soft, NT, somewhat distended- hypoactive BS Psychiatric: appropriate Neurological: Ox3 Musculoskeletal:     Comments: UE strength- biceps 4+/5; WE 4+ on R and 4/5 on L; triceps 4/5; Grip 3+/5; and FA 3+/5 B/L LE strength- HF 4/5; KE 4+/5; DF 4/5; and PF 4+/5 B/L   Skin:    General: Skin is warm and dry.     Comments: Cervical incision per neck Bruising and scabs between legs from prior use of brief  Neurological:     Mental Status: He is alert and oriented to person, place, and time.     Comments: Soft voice with moderate dysarthria and slurring at times. He was able to follow simple commands without difficulty. BUE weakness with sensory deficits. BLE with significant ataxia.  C6 sensory level decreased to light touch down to T2- torso is intact per pt-  but L1-S4 is decreased to light touch   Assessment/Plan: 1. Functional deficits which require 3+ hours per day of interdisciplinary therapy in a comprehensive inpatient rehab setting. Physiatrist is providing close team supervision and 24 hour management of active medical problems listed below. Physiatrist and rehab team continue to assess barriers to discharge/monitor patient progress toward functional and medical goals  Care Tool:  Bathing    Body parts bathed by patient: Right arm, Left arm, Chest, Abdomen, Front perineal area, Buttocks, Right upper leg, Left upper leg, Right lower leg, Left lower leg, Face         Bathing assist Assist Level: Minimal Assistance - Patient > 75%     Upper Body Dressing/Undressing Upper body dressing   What is the patient wearing?: Pull over shirt, Orthosis Orthosis activity level: Performed by helper  Upper body assist Assist Level: Minimal Assistance - Patient > 75%    Lower Body Dressing/Undressing Lower body dressing      What is the patient wearing?: Pants     Lower body assist Assist for  lower body dressing: Moderate Assistance - Patient 50 - 74%     Toileting Toileting    Toileting assist Assist for toileting: Minimal Assistance - Patient > 75%     Transfers Chair/bed transfer  Transfers assist     Chair/bed transfer assist level: Moderate Assistance - Patient 50 - 74%     Locomotion Ambulation   Ambulation assist      Assist level: Moderate Assistance - Patient 50 - 74% Assistive device: No Device Max distance: 100'   Walk 10 feet activity   Assist     Assist level: Moderate Assistance - Patient - 50 - 74% Assistive device: No Device   Walk 50 feet activity   Assist    Assist level: Moderate Assistance - Patient - 50 - 74% Assistive device: No Device    Walk 150 feet activity   Assist Walk 150 feet activity did not occur: Safety/medical concerns         Walk 10 feet on uneven surface   activity   Assist     Assist level: Moderate Assistance - Patient - 50 - 74% Assistive device: Other (comment) (Hand rail)   Wheelchair     Assist Is the patient using a wheelchair?: Yes Type of Wheelchair: Manual    Wheelchair assist level: Dependent - Patient 0% (Only used as transport chair) Max wheelchair distance: 150'    Wheelchair 50 feet with 2 turns activity    Assist        Assist Level: Dependent - Patient 0%   Wheelchair 150 feet activity     Assist      Assist Level: Dependent - Patient 0%   Blood pressure 130/76, pulse 60, temperature 97.6 F (36.4 C), resp. rate 16, height 5\' 5"  (1.651 m), weight 41.8 kg, SpO2 100 %.  Medical Problem List and Plan: 1. 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  Antithrombotics: -DVT/anticoagulation:  Pharmaceutical: Lovenox             -antiplatelet therapy: N/a 3. Pain Management: hydrocodone prn.              --will add gabapentin for neuropathic symptoms.    3/15- says mainly taking tylenol- since hx of addiction- trying to avoid.  4. Mood/Behavior/Sleep: LCSW to follow for evaluation and support.              -antipsychotic agents: N/A             --Discussed compliance with collar. Melatonin added for insomnia.Don't want to do Trazodone because concern for urinary retention  5. Neuropsych/cognition: This patient is capable of making decisions on his own behalf. 6. Skin/Wound Care: Routine pressure relief measures.  7. Fluids/Electrolytes/Nutrition: Monitor I/O. Check CMET in am 8. ABLA: Recheck CBC in am  3/15- Hb stable at 10.6 9. Electrolyte abnormalities: Recheck BMET in am to follow up on Sodium and potassium levels. 10. Low calorie malnutrition: Continue ensure bid. 11. Dysphagia: Discussed  edema/surgery leading to aspiration risk/PNA --patient does not want diet modified. Went over risks of pneumonia, and spesis, etc- pt emphatic doesn't want nectar thick liquids 12. Neurogenic bladder/Frequency: Will check voiding function with bladder scan for PVRs. -  sounds like is automatically double voiding- will start Flomax 0.4 mg qsupper  3/15- PVR's 0-6cc- so doing well 13. Bowel urgency with constipation: Will reduce colace to once daily and monitor. But also feels constipated- will see if needs to be cleaned out?  3/15- will give sorbitol- since pt feels constipated- 45 cc- doesn't want enema or Suppository- did have large BM             3/16 - Small bowel movement overnight, refused sorbitol, increase Colace to twice daily and MiraLAX to daily,     LOS: 2 days A FACE TO [place] 12/24/2022, 4:45 PM

## 2022-12-24 NOTE — Progress Notes (Signed)
Physical Therapy Session Note  Patient Details  Name: Samuel [name] MRN: [id number] Date of Birth: Jul 08, 1961  Today's Date: 12/24/2022 PT Individual Time: 1300-1345 PT Individual Time Calculation (min): 45 min   Short Term Goals: Week 1:  PT Short Term Goal 1 (Week 1): STGs = LTGs  Skilled Therapeutic Interventions/Progress Updates:    Pt recd in bathroom with NT present. Pt ambulated out of bathroom with min A and RW. No complaint of pain. Pt ambulated throughout session 100-150 ft with RW. Noted ataxic gait with minor scissoring. Added 4 lb ankle weight with moderate improvement in ataxia. Pt performed coordination tasks on steps with ankle weights in place, seated rest breaks for fatigue management. Pt then utilized nustep x 10 min at level 6 with 1 rest break for muscular strength and endurance. Pt returned to room in same manner with moderate  carryover with weights removed. Pt returned to bed with supervision and was left with all needs in reach and alarm active.   Therapy Documentation Precautions:  Precautions Precautions: Fall, Cervical Precaution Booklet Issued: Yes (comment) Precaution Comments: reviewed precautions. Required Braces or Orthoses: Cervical Brace Cervical Brace: Hard collar Restrictions Weight Bearing Restrictions: No Other Position/Activity Restrictions: Continue to reposition brace as pt likes to wear it loose. General:   Vital Signs:  Pain: Pain Assessment Pain Score: 2  Mobility:   Locomotion :    Trunk/Postural Assessment :    Balance:   Exercises:   Other Treatments:      Therapy/Group: Individual Therapy  Mickel Fuchs 12/24/2022, 1:33 PM

## 2022-12-24 NOTE — Progress Notes (Signed)
Occupational Therapy Session Note  Patient Details  Name: Samuel [name] MRN: [id number] Date of Birth: [birth year]  Today's Date: 12/24/2022 OT Individual Time: 0847-1000 OT Individual Time Calculation (min): 73 min    Short Term Goals: Week 1:  OT Short Term Goal 1 (Week 1): STG=LTG d/t ELOS  Skilled Therapeutic Interventions/Progress Updates: Patient received resting in bed. Patient reports having already washed up and requested to work on hands and endurance. Patient assisted to EOB with CGA. Assisted to reposition his C-Collar before starting treatment session due to loose fit. Ambulated to therapy gym with CGA using the walker. Worked on neuro reeducation once seated at table. Worked on pinch and lateral movements with the R hand stronger than the left. Increased time needed to fully extend the left index finger. In hand manipulation and storage task, with the RUE patient able to consistently pick up and hold 10-12 small tiles before placing them one at a time back on the table. With the LUE patient only able to keep 3-4 tiles in his hand at a time before placing them back onto the table. Continued with wrist extension/flexion and supination/pronation movements working to improve functional strength and FM activity tolerance. Patient very motivated and participatory even with difficult coordination tasks. Ambulated back to his room with RW, CGA. Continue with skilled OT POC to improve fine motor function and independence with self care skills.      Therapy Documentation Precautions:  Precautions Precautions: Fall, Cervical Precaution Booklet Issued: Yes (comment) Precaution Comments: reviewed precautions. Required Braces or Orthoses: Cervical Brace Cervical Brace: Hard collar Restrictions Weight Bearing Restrictions: No Other Position/Activity Restrictions: Continue to reposition brace as pt likes to wear it loose. General:   Vital Signs: Therapy Vitals Temp: 97.6 F (36.4  C) Pulse Rate: 60 Resp: 16 BP: 130/76 Patient Position (if appropriate): Lying Oxygen Therapy SpO2: 100 % O2 Device: Room Air  Pain: Pain Assessment Pain Score: 0/10 reported during OT treatment     Therapy/Group: Individual Therapy  Hermina Barters 12/24/2022, 3:37 PM

## 2022-12-24 NOTE — IPOC Note (Signed)
Overall Plan of Care [facility]) Patient Details Name: Samuel [name] MRN: [id number] DOB: 23-Nov-1960  Admitting Diagnosis: Cervical myelopathy [facility])  Hospital Problems: Principal Problem:   Cervical myelopathy ([place]) Active Problems:   Hyponatremia   TOBACCO ABUSE   Neurogenic bladder   Malnutrition of moderate degree     Functional Problem List: Nursing Bladder, Pain, Endurance, Medication Management, Safety  PT Balance, Endurance, Motor, Safety, Sensory, Skin Integrity  OT Balance, Endurance, Sensory, Pain, Motor  SLP    TR         Basic ADL's: OT Grooming, Bathing, Dressing, Toileting     Advanced  ADL's: OT Simple Meal Preparation     Transfers: PT Bed Mobility, Bed to Chair, Car, Manufacturing systems engineer, Metallurgist: PT Ambulation, Stairs     Additional Impairments: OT Fuctional Use of Upper Extremity  SLP        TR      Anticipated Outcomes Item Anticipated Outcome  Self Feeding MOD I  Swallowing      Basic self-care  MOD I  Toileting  MOD I   Bathroom Transfers MOD I toilet, S shower  Bowel/Bladder  manage bowel w mod I assist  Transfers  Supervision  Locomotion  Supervision  Communication     Cognition     Pain  < 4 with prns  Safety/Judgment  manage w cues   Therapy Plan: PT Intensity: Minimum of 1-2 x/day ,45 to 90 minutes PT Frequency: 5 out of 7 days PT Duration Estimated Length of Stay: 7-10 days OT Intensity: Minimum of 1-2 x/day, 45 to 90 minutes OT Frequency: 5 out of 7 days OT Duration/Estimated Length of Stay: 7-10     Team Interventions: Nursing Interventions Pain Management, Bowel Management, Patient/Family Education, Disease Management/Prevention, Discharge Planning, Medication Management  PT interventions Ambulation/gait training, Community reintegration, DME/adaptive equipment instruction, Neuromuscular re-education, Psychosocial support, Wheelchair propulsion/positioning, UE/LE Strength taining/ROM,  IT trainer, Training and development officer, Functional electrical stimulation, Pain management, Skin care/wound management, Therapeutic Activities, UE/LE Coordination activities, Discharge planning, Cognitive remediation/compensation, Disease management/prevention, Patient/family education, Functional mobility training, Splinting/orthotics, Therapeutic Exercise, Visual/perceptual remediation/compensation  OT Interventions Training and development officer, DME/adaptive equipment instruction, Patient/family education, Therapeutic Activities, Wheelchair propulsion/positioning, Therapeutic Exercise, Psychosocial support, Functional electrical stimulation, Community reintegration, Functional mobility training, Self Care/advanced ADL retraining, UE/LE Strength taining/ROM, UE/LE Coordination activities, Neuromuscular re-education, Skin care/wound managment, Discharge planning, Disease mangement/prevention, Pain management, Visual/perceptual remediation/compensation, Splinting/orthotics  SLP Interventions    TR Interventions    SW/CM Interventions Discharge Planning, Psychosocial Support, Patient/Family Education   Barriers to Discharge MD  Medical stability, Home enviroment access/loayout, Neurogenic bowel and bladder, Wound care, Lack of/limited family support, Weight, and Weight bearing restrictions  Nursing Decreased caregiver support 1 level no ste; Lives with (comment) (friend, [name], stays with him)  PT Decreased caregiver support    OT Decreased caregiver support    SLP      SW Insurance for SNF coverage     Team Discharge Planning: Destination: PT-Home ,OT- Home , SLP-  Projected Follow-up: PT-24 hour supervision/assistance, Outpatient PT, OT-  Home health OT, Outpatient OT, SLP-  Projected Equipment Needs: PT-To be determined, OT- To be determined, SLP-  Equipment Details: PT- , OT-  Patient/family involved in discharge planning: PT- Patient,  OT-Patient, SLP-   MD ELOS: 7-10 days Medical  Rehab Prognosis:  Good Assessment: The patient has been admitted for CIR therapies with the diagnosis of cervical myelopathy. The team will be addressing functional mobility, strength, stamina, balance, safety,  adaptive techniques and equipment, self-care, bowel and bladder mgt, patient and caregiver education, . Goals have been set at mod I to supervision. Anticipated discharge destination is home.        See Team Conference Notes for weekly updates to the plan of care
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
## 2022-12-26 LAB — BASIC METABOLIC PANEL
Anion gap: 8 (ref 5–15)
BUN: 8 mg/dL (ref 8–23)
CO2: 25 mmol/L (ref 22–32)
Calcium: 9.1 mg/dL (ref 8.9–10.3)
Chloride: 96 mmol/L — ABNORMAL LOW (ref 98–111)
Creatinine, Ser: 0.64 mg/dL (ref 0.61–1.24)
GFR, Estimated: >60 mL/min (ref >60)
Glucose, Bld: 217 mg/dL — ABNORMAL HIGH (ref 70–99)
Potassium: 3.9 mmol/L (ref 3.5–5.1)
Sodium: 129 mmol/L — ABNORMAL LOW (ref 135–145)

## 2022-12-26 LAB — CBC
HCT: 34.8 % — ABNORMAL LOW (ref 39.0–52.0)
Hemoglobin: 11.5 g/dL — ABNORMAL LOW (ref 13.0–17.0)
MCH: 29.9 pg (ref 26.0–34.0)
MCHC: 33 g/dL (ref 30.0–36.0)
MCV: 90.6 fL (ref 80.0–100.0)
Platelets: 294 10*3/uL (ref 150–400)
RBC: 3.84 MIL/uL — ABNORMAL LOW (ref 4.22–5.81)
RDW: 14.4 % (ref 11.5–15.5)
WBC: 3.9 10*3/uL — ABNORMAL LOW (ref 4.0–10.5)
nRBC: 0 % (ref 0.0–0.2)

## 2022-12-26 MED ORDER — MELATONIN 3 MG PO TABS
3.0000 mg | ORAL_TABLET | Freq: Every day | ORAL | Status: DC
  Administered 2022-12-26 – 2022-12-29 (×4): 3 mg via ORAL
  Filled 2022-12-26 (×7): qty 1

## 2022-12-26 NOTE — Progress Notes (Signed)
Occupational Therapy Session Note  Patient Details  Name: Samuel [name] MRN: [id number] Date of Birth: 1960-10-23  Today's Date: 12/26/2022 OT Individual Time: 1020-1100 session 1 OT Individual Time Calculation (min): 40 min  Session 2: 1346-1415   Short Term Goals: Week 1:  OT Short Term Goal 1 (Week 1): STG=LTG d/t ELOS  Skilled Therapeutic Interventions/Progress Updates: Session 1:  pt greeted seated in w/c, reporting feeling like he was "strapped down" d/t safety belt. Education provided on safety alarms. Pt transported to gym for time mgmt. Utilized BITs to further assess RUE vs LUE coordination, [place] and sensation.   Pt first completed seated therapeutic activity on BITS where pt instructed to reach dynamically from chair to screen to assess RUE vs LUE coordination. Pt completed task as indicated below:  RUE- 97%, 1 miss, 1.39 secs  LUE- 96% 1.23 sec reaction time, 2 misses   Pt completed seated tracing task on BITS with RUE to facilitate improved RUE GM coordination. Pt completed task with good carryover able to trace shapes with 90% accuracy.    Pt completed dynamic standing balance task where pt instructed to use LUE to remove clothespins from mirror on L side and use BUEs to pin clothespins to cup on R side. Pt completed task with CGA with no UE support or LOB.   Pt completed functional ambulation back to room with RW and CGA. Pt completed ambulatory toilet transfer with RW and CGA. Pt able to stand for continent urine void with CGA.   Ended session with pt seated in w/c with alarm belt activated and all needs within reach.   Session 2: pt greeted asleep in supine but easily able to arouse and agreeable to OT intervention. Pt completed supine>sit with CGA, pt donned shoes from EOB with set- up assist. CGA for sit>stand with RW. Pt completed ambulatory toilet transfer with RW and CGA. Pt with continent urine void able to complete 3/3 toileting tasks with CGA. Pt ambulated to  sink with RW and CGA with pt able to stand at sink for hand hygiene with CGA and no UE supported. Total A transport to gym for time mgmt.   Remainder of session focused on seated [facility] therapeutic activities. Pt instructed to recreate shape structures with small shapes with an emphasis on [facility] and improved proprioception with BUEs. Pt completed task with + time and supervision with pt having to stacks shapes on top of each other as well as stand shapes vertically beside each other. Pt reports hardest part is "getting the grip" on the blocks.   Issued pt dycem to assist with functional grasp around soda cans as pt reports grasping soda is difficulty. Pt also reports he fees like his hands slip off of RW, applied coband to arm supports on RW with able to maintain improved grasp during functional mobility back to room with RW and CGA. Ended session with pt supine in bed with bed alarm activated and all needs within reach.    Therapy Documentation Precautions:  Precautions Precautions: Fall, Cervical Precaution Booklet Issued: Yes (comment) Precaution Comments: reviewed precautions. Required Braces or Orthoses: Cervical Brace Cervical Brace: Hard collar Restrictions Weight Bearing Restrictions: No Other Position/Activity Restrictions: Continue to reposition brace as pt likes to wear it loose.   Pain: Session 1: no pain  Session 2: no pain     Therapy/Group: Individual Therapy  Corinne Ports [facility] 12/26/2022, 12:10 PM

## 2022-12-26 NOTE — Progress Notes (Signed)
PROGRESS NOTE   Subjective/Complaints:  Pt reports peeing well- low bladder scans.   Tired and sore- says sleeping very sleepy at night- A little "out of it' this AM.   ROS:  Pt denies SOB, abd pain, CP, N/V/C/D, and vision changes  Except for HPI   Objective:   No results found. Recent Labs    12/26/22 0825  WBC 3.9*  HGB 11.5*  HCT 34.8*  PLT 294   No results for input(s): "NA", "K", "CL", "CO2", "GLUCOSE", "BUN", "CREATININE", "CALCIUM" in the last 72 hours.  Intake/Output Summary (Last 24 hours) at 12/26/2022 0907 Last data filed at 12/26/2022 0754 Gross per 24 hour  Intake 598 ml  Output 1325 ml  Net -727 ml        Physical Exam: Vital Signs Blood pressure 117/79, pulse 62, temperature (!) 97.5 F (36.4 C), temperature source Oral, resp. rate 18, height 5\' 5"  (1.651 m), weight 41.8 kg, SpO2 100 %.    General: awake, alert, appropriate,  but sleepy; supine in bed; curled up; cachetic; NAD HENT: conjugate gaze; oropharynx dry- cervical collar- with steristrips on incision CV: regular rate; no JVD Pulmonary: CTA B/L; no W/R/R- good air movement GI: soft, NT, ND, (+)BS- slightly hypoactive Psychiatric: appropriate Neurological: Ox3- but sleepy  Musculoskeletal:     Comments: UE strength- biceps 4+/5; WE 4+ on R and 4/5 on L; triceps 4/5; Grip 3+/5; and FA 3+/5 B/L LE strength- HF 4/5; KE 4+/5; DF 4/5; and PF 4+/5 B/L   Skin:    General: Skin is warm and dry.     Comments: Cervical incision per neck -covered in dried blood and Steri-Strips, appears well-approximated Bruising and scabs between legs from prior use of brief  Neurological:     Mental Status: He is alert and oriented to person, place, and time.     Comments: Soft voice with moderate dysarthria and slurring at times. He was able to follow simple commands without difficulty. BUE weakness with sensory deficits. BLE with significant ataxia.   C6 sensory level decreased to light touch down to T2- torso is intact per pt- but L1-S4 is decreased to light touch   Assessment/Plan: 1. Functional deficits which require 3+ hours per day of interdisciplinary therapy in a comprehensive inpatient rehab setting. Physiatrist is providing close team supervision and 24 hour management of active medical problems listed below. Physiatrist and rehab team continue to assess barriers to discharge/monitor patient progress toward functional and medical goals  Care Tool:  Bathing    Body parts bathed by patient: Right arm, Left arm, Chest, Abdomen, Front perineal area, Buttocks, Right upper leg, Left upper leg, Right lower leg, Left lower leg, Face         Bathing assist Assist Level: Minimal Assistance - Patient > 75%     Upper Body Dressing/Undressing Upper body dressing   What is the patient wearing?: Pull over shirt, Orthosis Orthosis activity level: Performed by helper  Upper body assist Assist Level: Minimal Assistance - Patient > 75%    Lower Body Dressing/Undressing Lower body dressing      What is the patient wearing?: Pants     Lower body assist  Assist for lower body dressing: Moderate Assistance - Patient 50 - 74%     Toileting Toileting    Toileting assist Assist for toileting: Minimal Assistance - Patient > 75%     Transfers Chair/bed transfer  Transfers assist     Chair/bed transfer assist level: Moderate Assistance - Patient 50 - 74%     Locomotion Ambulation   Ambulation assist      Assist level: Moderate Assistance - Patient 50 - 74% Assistive device: No Device Max distance: 100'   Walk 10 feet activity   Assist     Assist level: Moderate Assistance - Patient - 50 - 74% Assistive device: No Device   Walk 50 feet activity   Assist    Assist level: Moderate Assistance - Patient - 50 - 74% Assistive device: No Device    Walk 150 feet activity   Assist Walk 150 feet activity did  not occur: Safety/medical concerns         Walk 10 feet on uneven surface  activity   Assist     Assist level: Moderate Assistance - Patient - 50 - 74% Assistive device: Other (comment) (Hand rail)   Wheelchair     Assist Is the patient using a wheelchair?: Yes Type of Wheelchair: Manual    Wheelchair assist level: Dependent - Patient 0% (Only used as transport chair) Max wheelchair distance: 150'    Wheelchair 50 feet with 2 turns activity    Assist        Assist Level: Dependent - Patient 0%   Wheelchair 150 feet activity     Assist      Assist Level: Dependent - Patient 0%   Blood pressure 117/79, pulse 62, temperature (!) 97.5 F (36.4 C), temperature source Oral, resp. rate 18, height 5\' 5"  (1.651 m), weight 41.8 kg, SpO2 100 %.  Medical Problem List and Plan: 1. Functional deficits secondary to cervical myelopathy with C5 incomplete ASIA D SCI s/p C3/4 ACDF             -patient may  shower- cover incision             -ELOS/Goals: 7-10 days mod I  Con't CIR PT and OT 2.  Antithrombotics: -DVT/anticoagulation:  Pharmaceutical: Lovenox             -antiplatelet therapy: N/a 3. Pain Management: hydrocodone prn.              --will add gabapentin for neuropathic symptoms.    3/15- says mainly taking tylenol- since hx of addiction- trying to avoid.   3/18- sore this AM, but tolerable 4. Mood/Behavior/Sleep: LCSW to follow for evaluation and support.              -antipsychotic agents: N/A             --Discussed compliance with collar. Melatonin added for insomnia.Don't want to do Trazodone because concern for urinary retention  5. Neuropsych/cognition: This patient is capable of making decisions on his own behalf. 6. Skin/Wound Care: Routine pressure relief measures.  7. Fluids/Electrolytes/Nutrition: Monitor I/O. Check CMET in am 8. ABLA: Recheck CBC in am  3/15- Hb stable at 10.6  3/18- Hb 11.5 9. Electrolyte abnormalities: Recheck BMET in  am to follow up on Sodium and potassium levels. 10. Low calorie malnutrition: Continue ensure bid.  3/18- Albumin 3.3 11. Dysphagia: Discussed edema/surgery leading to aspiration risk/PNA --patient does not want diet modified. Went over risks of pneumonia, and spesis, etc- pt emphatic  doesn't want nectar thick liquids 12. Neurogenic bladder/Frequency: Will check voiding function with bladder scan for PVRs. - sounds like is automatically double voiding- will start Flomax 0.4 mg qsupper  3/15- PVR's 0-6cc- so doing well  3/18- PVRs low- voiding well 13. Bowel urgency with constipation: Will reduce colace to once daily and monitor. But also feels constipated- will see if needs to be cleaned out?  3/15- will give sorbitol- since pt feels constipated- 45 cc- doesn't want enema or Suppository- did have large BM             3/16 - Small bowel movement overnight, refused sorbitol, increase Colace to twice daily and MiraLAX to daily,  3/17 -Medium bowel movement this a.m., feels much improved 14. Insomnia  3/18- "out of it" this AM- will decrease melatonin to 3 mg QHS     LOS: 4 days A FACE TO FACE EVALUATION WAS PERFORMED  Ernestene Coover 12/26/2022, 9:07 AM

## 2022-12-26 NOTE — Progress Notes (Signed)
Physical Therapy Session Note  Patient Details  Name: Samuel [name] MRN: [id number] Date of Birth: 06/10/1961  Today's Date: 12/26/2022 PT Individual Time: 1120-1200, [id number] PT Individual Time Calculation (min): 40 min, 28 min   Short Term Goals: Week 1:  PT Short Term Goal 1 (Week 1): STGs = LTGs  Skilled Therapeutic Interventions/Progress Updates:    Session 1; Pt seated in w/c on arrival and agreeable to therapy. No complaint of pain. Pt ambulated with RW and CGA to/from day room, >200 ft. Note mild-moderate ataxia which did worsen with fatigue, but overall improved from previous session with this therapist 2 days ago. Cues for upright posture and forward gaze. Pt then participated in coordination activities with cones, first tapping, then cone tip overs, and crossbody tapping. Improved with repetition. Pt then performed squats to pick up cones from floor with alternating hands and hand to therapist at eye level. Cues to keep RW close for safety. Donned gloves with min A. Pt then stood with no UE support and cleaned cones for UE coordination and functional balance. Pt returned to room and sat in recliner, was left with all needs in reach and alarm active.   Session 2: pt received in bed and agreeable to therapy. No complaint of pain. Supine>sit with supervision, donned shoes with min A. Pt then requested to use bathroom. ambulatory transfer with RW and CGA for continent void in standing, CGA for 3/3 toileting tasks. Pt then washed hands in standing with CGA. Pt ambulated >200 ft to day room with CGA and RW, noted mild increase in ataxia from morning session d/t fatigue but no increase in required assist level. Pt then used nustep at level 7 x 6 min for global strength of endurance and integration of reciprocal gait pattern. Seated rest break for safety before ambulating back to room. Pt returned to bed in the same manner and was left with all needs in reach and alarm active.   Therapy  Documentation Precautions:  Precautions Precautions: Fall, Cervical Precaution Booklet Issued: Yes (comment) Precaution Comments: reviewed precautions. Required Braces or Orthoses: Cervical Brace Cervical Brace: Hard collar Restrictions Weight Bearing Restrictions: No Other Position/Activity Restrictions: Continue to reposition brace as pt likes to wear it loose. General:       Therapy/Group: Individual Therapy  Mickel Fuchs 12/26/2022, 12:45 PM

## 2022-12-26 NOTE — Care Management (Signed)
[facility] Individual Statement of Services  Patient Name:  Samuel [name]  Date:  12/26/2022  Welcome to the [facility].  Our goal is to provide you with an individualized program based on your diagnosis and situation, designed to meet your specific needs.  With this comprehensive rehabilitation program, you will be expected to participate in at least 3 hours of rehabilitation therapies Monday-Friday, with modified therapy programming on the weekends.  Your rehabilitation program will include the following services:  Physical Therapy (PT), Occupational Therapy (OT), 24 hour per day rehabilitation nursing, Therapeutic Recreaction (TR), Psychology, Neuropsychology, Care Coordinator, Rehabilitation Medicine, [place], and Other  Weekly team conferences will be held on Tuesdays to discuss your progress.  Your Inpatient Rehabilitation Care Coordinator will talk with you frequently to get your input and to update you on team discussions.  Team conferences with you and your family in attendance may also be held.  Expected length of stay: 7-10 days    Overall anticipated outcome: Supervision  Depending on your progress and recovery, your program may change. Your Inpatient Rehabilitation Care Coordinator will coordinate services and will keep you informed of any changes. Your Inpatient Rehabilitation Care Coordinator's name and contact numbers are listed  below.  The following services may also be recommended but are not provided by the [facility] will be made to provide these services after discharge if needed.  Arrangements include referral to agencies that provide these services.  Your insurance has been verified to be:  [place] [employer] [place]  Your  primary doctor is:  Durene Fruits  Pertinent information will be shared with your doctor and your insurance company.  Inpatient Rehabilitation Care Coordinator:  Cathleen Corti [phone] or ([phone]  Information discussed with and copy given to patient by: Rana Snare, 12/26/2022, 10:05 AM

## 2022-12-26 NOTE — Progress Notes (Addendum)
Patient ID: Samuel [name], male   DOB: 19-Mar-1961, 62 y.o.   MRN: [id number]  1004- SW left message for pt sister Samuel [name] to inform on ELOS and will follow-up after team conference tomorrow.  *SW returned phone call and left message for pt sister Samuel [name]. SW waiting on follow-up.   1443-SW spoke with pt sister Samuel [name] to inform on ELOS. She confirms pt friend is able to assist, however, he also works. SW shared will follow-up with updates after team conference.   Loralee Pacas, MSW, [place] Office: [phone] Cell: [phone] Fax: [phone]

## 2022-12-26 NOTE — Progress Notes (Addendum)
Occupational Therapy Session Note  Patient Details  Name: Samuel [name] MRN: [id number] Date of Birth: 04/27/1961  Today's Date: 12/26/2022 OT Individual Time: [id number] OT Individual Time Calculation (min): 74 min    Short Term Goals: Week 1:  OT Short Term Goal 1 (Week 1): STG=LTG d/t ELOS  Skilled Therapeutic Interventions/Progress Updates:    OT intervention with focus on bathing at shower level and dressing with sit<>stand from w/c. Supine>sit EOB with supervsion. Covering placed over sutures and pt amb to bathroom with CGA. Pt doffed C-collar after clothing removed. Bathing with CGA sit<>stand from TTB. Pt returned to room and completed dressing with CGA sit<>stand from w/c. Pt requires more then a reasonable amount of time to complete tasks. Pt with mild ataxia during amb and BADLs. No LOB noted. Pt engaged in theraputty tasks with Red theraputty. Pt removed and replaced small beads. Pt with decreased sensation in BUE and frequently dropped beads. Pt remained in w/c with belt alarm activated. All needs within reach.   Therapy Documentation Precautions:  Precautions Precautions: Fall, Cervical Precaution Booklet Issued: Yes (comment) Precaution Comments: reviewed precautions. Required Braces or Orthoses: Cervical Brace Cervical Brace: Hard collar Restrictions Weight Bearing Restrictions: No Other Position/Activity Restrictions: Continue to reposition brace as pt likes to wear it loose.  Pain: Pain Assessment Pain Scale: 0-10 Pain Score: 0-No pain   Therapy/Group: Individual Therapy  Samuel [name], Samuel [name] 12/26/2022, 9:37 AM

## 2022-12-27 MED ORDER — SENNA 8.6 MG PO TABS
2.0000 | ORAL_TABLET | Freq: Every day | ORAL | Status: DC
  Administered 2022-12-27 – 2022-12-29 (×3): 17.2 mg via ORAL
  Filled 2022-12-27 (×5): qty 2

## 2022-12-27 MED ORDER — SORBITOL 70 % SOLN: 30.0000 mL | Freq: Once | Status: DC

## 2022-12-27 NOTE — Progress Notes (Signed)
Occupational Therapy Session Note  Patient Details  Name: ALDYN SWENOR MRN: [id number] Date of Birth: 10/05/1961  Today's Date: 12/27/2022 OT Individual Time: [phone] 1st Session; 1346-1500 2nd Session  OT Individual Time Calculation (min): 60 min, 74 min    Short Term Goals: Week 1:  OT Short Term Goal 1 (Week 1): STG=LTG d/t ELOS  Skilled Therapeutic Interventions/Progress Updates:   1st Session:  Pt seen for skilled OT session. OT planning to attend Team Conf later to discuss pt's status, goals and discharge planning. OT Encouraged toileting as pt had no been since early am. Pt amb with CGA from recliner to bathroom with RW surround and stood for voiding. Pt able to manage clothing and balance with CGA. Pt then amb back to sinkside for hand washing with CGA/close S. Pt then transported to demo apt space to work on TTB transfers. Pt has bench, suction grab bar and HH shower hose in place in friends apt where he stays. Pt able to perform simulated TTB shower with close S. Pt then amb with CGA/Close s to kitchen sink and stood 2 trials on Airex foam mat for functional reach unilaterally 20 reps to R then L with CGA and increase time and effort for L hand coord. After seated rest pt  completed assessments including: Grip R 62 lbs L 40 lbs; 9HPT: 2 min 32 sec L; 38 sec R well below norm for age and gender on L UE. Transported pt w/c back to room, left w/c level with chair alarm set, needs and nurse call button in reach.  Pain: no pain reported.    2nd Session:  Pt seen for 2nd session of OT this pm. Pt in bed resting upon OT arrival and voice he is slightly upset his sister told the MSW his friend would not be home to help him because he works, therefore d/c was pushed to next Tues for increased time to reach mod I. Pt reports the friend is on unemployment and would not be at work and can assist. OT let pt know I would report back to primary team. Pt open to all therapy this session.  Transferred from bed to w/c via SPT with RW with close S. Pt self propelled to and from demo apt space ~ 65 ft x 2 (to/from). Pt then amb with RW to retrieve pantry items set in cabinets, fridge and freezer and microwave and transport via new RW bag issued this session. Required CGA/close S. Pt then transported to middle gym and performed 2 trials of standing balance on Bioness with CGA for maze target and min A for higher level base balancer. Pt self propelled w/c back to room, left w/c level with chair alarm set, needs and nurse call button in reach.  Pain: 0/10- no pain reported t/o session.    Therapy Documentation Precautions:  Precautions Precautions: Fall, Cervical Precaution Booklet Issued: Yes (comment) Precaution Comments: reviewed precautions. Required Braces or Orthoses: Cervical Brace Cervical Brace: Hard collar Restrictions Weight Bearing Restrictions: No Other Position/Activity Restrictions: Continue to reposition brace as pt likes to wear it loose.    Therapy/Group: Individual Therapy  Barnabas Lister 12/27/2022, 7:43 AM

## 2022-12-27 NOTE — Patient Care Conference (Signed)
Inpatient RehabilitationTeam Conference and Plan of Care Update Date: 12/28/2022   Time: 11:03 AM    Patient Name: Samuel [name]      Medical Record Number: [id number]  Date of Birth: 1961/03/19 Sex: Male         Room/Bed: 4M09C/4M09C-01 Payor Info: Payor: [employer] / Plan:  MEDICAID [place] / Product Type: *No Product type* /    Admit Date/Time:  12/22/2022  1:56 PM  Primary Diagnosis:  Cervical myelopathy [facility])  Hospital Problems: Principal Problem:   Cervical myelopathy ([place]) Active Problems:   Hyponatremia   TOBACCO ABUSE   Neurogenic bladder   Malnutrition of moderate degree    Expected Discharge Date: Expected Discharge Date: 01/03/23  Team Members Present: Physician leading conference: Dr. Courtney Heys Social Worker Present: Loralee Pacas, [place] Nurse Present: Tacy Learn, RN PT Present: Ailene Rud, PT OT Present: Jamey Ripa, OT PPS Coordinator present : Gunnar Fusi, SLP     Current Status/Progress Goal Weekly Team Focus  Bowel/Bladder   Patient is continent of bladder/bowel, LBM 12/27/22, Has schedule and prn Laxative/stool softener   Maintain continence   Patient is able to inform staff of his toileting needs, so that constipation is addressed in a timely manner    Swallow/Nutrition/ Hydration               ADL's   bathing/dressing-CGA. Functional transers-CGA.   mod I overall   safety awareness, education, BADLs, BUE FMC/GMC    Mobility   CGA with gait and transfers, limited by ataxia which is improving   supervision overall  coordination, balance, gait training    Communication                Safety/Cognition/ Behavioral Observations               Pain   Cervical neck pain take mostly prn Tylenol po   , = 3/10   Assess and monitor QS/PRN complications    Skin   s/p cervical neck incison has old dried drainage, adhesive strips intact, Aspen collar on at all times    Preventon of infection  Assess incisional area for evidence of complaints from patient, swelling, irritation and changes QS and prn      Discharge Planning:  Pt will discharge to home with support from his friend that will be staying at the home. SW will confirm their are no barriers to discharge.   Team Discussion: Cervical myelopathy. Continent B/B with time voiding. Neck/shoulder pain managed with PRN medications. Incision to neck has attached edges without drainage. C-collar at all times. Trying to take Tylenol for pain. Limiting factor with therapy is ataxia. Needs to MOD I at home due to lack of 24/7 care.  Patient on target to meet rehab goals: yes, Bathing/dressing CGA. CGA with gait and transfers.  *See Care Plan and progress notes for long and short-term goals.   Revisions to Treatment Plan:  Monitor labs  Teaching Needs: Medications, safety, self care, gait/transfer training, skin care, etc.   Current Barriers to Discharge: Lack of/limited family support  Possible Resolutions to Barriers: Family education, nursing education, order recommended DME     Medical Summary Current Status: trying to take tylenol for pain- inciciosn with steristrips- continent B/B; neck and shoulder pain- maintain Cervical collar  Barriers to Discharge: Uncontrolled Pain;Weight bearing restrictions;Medical stability;Inadequate Nutritional Intake  Barriers to Discharge Comments: Na 129- will recheck to determine if needs treatment Possible Resolutions to Barriers/Weekly Focus: Barriers-  impaired mobility- doesn't have help at home- needs Mod I goals-   d/c 3/26 since needs to be Mod I   Continued Need for Acute Rehabilitation Level of Care: The patient requires daily medical management by a physician with specialized training in physical medicine and rehabilitation for the following reasons: Direction of a multidisciplinary physical rehabilitation program to maximize functional independence :  Yes Medical management of patient stability for increased activity during participation in an intensive rehabilitation regime.: Yes Analysis of laboratory values and/or radiology reports with any subsequent need for medication adjustment and/or medical intervention. : Yes   I attest that I was present, lead the team conference, and concur with the assessment and plan of the team.   Ernest Pine 12/28/2022, 9:16 AM

## 2022-12-27 NOTE — Progress Notes (Addendum)
Patient ID: Samuel [name], male   DOB: 07-15-1961, 62 y.o.   MRN: [id number]  SW met with pt in room to provide updates from team conference, and d/c date 3/26. Amenable to outpatient PT/OT at [facility].   [phone]- SW spoke with pt sister [name] to discuss above.She asked about his insurance assisting with appointments. SW shared pt will need to schedule, and number for [employer] will be in discharge instructions.   SW faxed referral to [facility] (p:[phone]/[phone]).  Loralee Pacas, MSW, [place] Office: [phone] Cell: [phone] Fax: [phone]

## 2022-12-27 NOTE — Progress Notes (Signed)
Physical Therapy Session Note  Patient Details  Name: Samuel [name] MRN: [id number] Date of Birth: 18-Oct-1960  Today's Date: 12/27/2022 PT Individual Time: [id number], 1130-1200 PT Individual Time Calculation (min): 42 min, 30 min    Short Term Goals: Week 1:  PT Short Term Goal 1 (Week 1): STGs = LTGs  Skilled Therapeutic Interventions/Progress Updates:    Pt recd standing at sink as hand off from NT. Pt changed shoes to tennis shoes with min A to pull over heels. Ambulated to therapy gym with RW and CGA, mildly ataxic. Cues for upright posture throughout. Pt participated in quadruped push ups 3 x 10 for UE weight bearing and core strength. Quadruped<>seated with CGA fading to supervision with noted improvement in coordination by end of session. Pt then performed tall kneel<>half kneel 2 x 6 with BUE support on bench with CGA, improved coordination and success with second set. Pt then performed tall kneel squats x 20 for improved hip extension and glute strength. Pt ambulated back to room in the same manner and remained in recliner, was left with all needs in reach and alarm active.   Session 2: pt received in bed and agreeable to therapy. No complaint of pain. Supine>sit with supervision, donned shoes with increased time and supervision. Pt ambulated to/from therapy gym with RW and CGA, mildly ataxic but improving from previous sessions with this therapist. Pt participated in cone taps on airex pad for coordination and improved balance. Progressed to UUE support, with increased difficulty with LUE support. Pt then performed box squats on airex 4 x 10, progressed to no UE support. Education on decr reliance on RW with sit<>stand. Pt returned to room and used bathroom with supervision for continent void. Returned to bed with supervision and was left with all needs in reach and alarm active.   Therapy Documentation Precautions:  Precautions Precautions: Fall, Cervical Precaution Booklet Issued:  Yes (comment) Precaution Comments: reviewed precautions. Required Braces or Orthoses: Cervical Brace Cervical Brace: Hard collar Restrictions Weight Bearing Restrictions: No Other Position/Activity Restrictions: Continue to reposition brace as pt likes to wear it loose. General:       Therapy/Group: Individual Therapy  Mickel Fuchs 12/27/2022, 8:44 AM

## 2022-12-27 NOTE — Progress Notes (Signed)
PROGRESS NOTE   Subjective/Complaints:  Pt reports needs to have BM, but doesn't want meds til after therapy- however had large BM after seen by physician.   Also said really worked hard in therapy yesterday- so sore and tired.   Also, doesn't feel out of it this Am and knows not in dream.   ROS:   Pt denies SOB, abd pain, CP, N/V/C/D, and vision changes  Except for HPI   Objective:   No results found. Recent Labs    12/26/22 0825  WBC 3.9*  HGB 11.5*  HCT 34.8*  PLT 294   Recent Labs    12/26/22 0825  NA 129*  K 3.9  CL 96*  CO2 25  GLUCOSE 217*  BUN 8  CREATININE 0.64  CALCIUM 9.1    Intake/Output Summary (Last 24 hours) at 12/27/2022 [id number] Last data filed at 12/27/2022 0804 Gross per 24 hour  Intake 1067 ml  Output 900 ml  Net 167 ml        Physical Exam: Vital Signs Blood pressure 114/64, pulse 64, temperature 98.1 F (36.7 C), resp. rate 18, height 5\' 5"  (1.651 m), weight 41.8 kg, SpO2 100 %.     General: awake, alert, appropriate, sleepy- curled up in bed; NAD HENT: conjugate gaze; oropharynx dry- wearing cervical collar- incision steristrips- looks good CV: regular rate; no JVD Pulmonary: CTA B/L; no W/R/R- good air movement GI: soft, NT, (+) BS slightly distended when examined Psychiatric: appropriate- but very sleepy Neurological: Ox3  Musculoskeletal:     Comments: UE strength- biceps 4+/5; WE 4+ on R and 4/5 on L; triceps 4/5; Grip 3+/5; and FA 3+/5 B/L LE strength- HF 4/5; KE 4+/5; DF 4/5; and PF 4+/5 B/L   Skin:    General: Skin is warm and dry.     Comments: Cervical incision per neck -covered in dried blood and Steri-Strips, appears well-approximated Bruising and scabs between legs from prior use of brief  Neurological:     Mental Status: He is alert and oriented to person, place, and time.     Comments: Soft voice with moderate dysarthria and slurring at times. He was  able to follow simple commands without difficulty. BUE weakness with sensory deficits. BLE with significant ataxia.  C6 sensory level decreased to light touch down to T2- torso is intact per pt- but L1-S4 is decreased to light touch   Assessment/Plan: 1. Functional deficits which require 3+ hours per day of interdisciplinary therapy in a comprehensive inpatient rehab setting. Physiatrist is providing close team supervision and 24 hour management of active medical problems listed below. Physiatrist and rehab team continue to assess barriers to discharge/monitor patient progress toward functional and medical goals  Care Tool:  Bathing    Body parts bathed by patient: Right arm, Left arm, Chest, Abdomen, Front perineal area, Buttocks, Right upper leg, Left upper leg, Right lower leg, Left lower leg, Face         Bathing assist Assist Level: Contact Guard/Touching assist     Upper Body Dressing/Undressing Upper body dressing   What is the patient wearing?: Pull over shirt, Orthosis Orthosis activity level: Performed by helper  Upper body  assist Assist Level: Minimal Assistance - Patient > 75%    Lower Body Dressing/Undressing Lower body dressing      What is the patient wearing?: Pants, Underwear/pull up     Lower body assist Assist for lower body dressing: Contact Guard/Touching assist     Toileting Toileting    Toileting assist Assist for toileting: Moderate Assistance - Patient 50 - 74%     Transfers Chair/bed transfer  Transfers assist     Chair/bed transfer assist level: Moderate Assistance - Patient 50 - 74%     Locomotion Ambulation   Ambulation assist      Assist level: Moderate Assistance - Patient 50 - 74% Assistive device: No Device Max distance: 100'   Walk 10 feet activity   Assist     Assist level: Moderate Assistance - Patient - 50 - 74% Assistive device: No Device   Walk 50 feet activity   Assist    Assist level: Moderate  Assistance - Patient - 50 - 74% Assistive device: No Device    Walk 150 feet activity   Assist Walk 150 feet activity did not occur: Safety/medical concerns         Walk 10 feet on uneven surface  activity   Assist     Assist level: Moderate Assistance - Patient - 50 - 74% Assistive device: Other (comment) (Hand rail)   Wheelchair     Assist Is the patient using a wheelchair?: Yes Type of Wheelchair: Manual    Wheelchair assist level: Dependent - Patient 0% (Only used as transport chair) Max wheelchair distance: 150'    Wheelchair 50 feet with 2 turns activity    Assist        Assist Level: Dependent - Patient 0%   Wheelchair 150 feet activity     Assist      Assist Level: Dependent - Patient 0%   Blood pressure 114/64, pulse 64, temperature 98.1 F (36.7 C), resp. rate 18, height 5\' 5"  (1.651 m), weight 41.8 kg, SpO2 100 %.  Medical Problem List and Plan: 1. 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  Antithrombotics: -DVT/anticoagulation:  Pharmaceutical: Lovenox             -antiplatelet therapy: N/a 3. Pain Management: hydrocodone prn.              --will add gabapentin for neuropathic symptoms.    3/15- says mainly taking tylenol- since hx of addiction- trying to avoid.   3/18- sore this AM, but tolerable  3/19- sore/tired this Am- therapy pushed him yesterday- con't regimen 4. Mood/Behavior/Sleep: LCSW to follow for evaluation and support.              -antipsychotic agents: N/A             --Discussed compliance with collar. Melatonin added for insomnia.Don't want to do Trazodone because concern for urinary retention   3/19- decreased melatonin to 3 mg QHS 5. Neuropsych/cognition: This patient is capable of making  decisions on his own behalf. 6. Skin/Wound Care: Routine pressure relief measures.  7. Fluids/Electrolytes/Nutrition: Monitor I/O. Check CMET in am 8. ABLA: Recheck CBC in am  3/15-  Hb stable at 10.6  3/18- Hb 11.5 9. Electrolyte abnormalities: Recheck BMET in am to follow up on Sodium and potassium levels. 10. Low calorie malnutrition: Continue ensure bid.  3/18- Albumin 3.3 11. Dysphagia: Discussed edema/surgery leading to aspiration risk/PNA --patient does not want diet modified. Went over risks of pneumonia, and spesis, etc- pt emphatic doesn't want nectar thick liquids 12. Neurogenic bladder/Frequency: Will check voiding function with bladder scan for PVRs. - sounds like is automatically double voiding- will start Flomax 0.4 mg qsupper  3/15- PVR's 0-6cc- so doing well  3/18- PVRs low- voiding well  3/19- voiding well- no PVRS/Bladder scans anymore- will d/c 13. Bowel urgency with constipation: Will reduce colace to once daily and monitor. But also feels constipated- will see if needs to be cleaned out?  3/15- will give sorbitol- since pt feels constipated- 45 cc- doesn't want enema or Suppository- did have large BM             3/16 - Small bowel movement overnight, refused sorbitol, increase Colace to twice daily and MiraLAX to daily,  3/17 -Medium bowel movement this a.m., feels much improved  3/19- added Senna 2 tabs qday and will try Sorbitol again after therapy- LBM 2 days ago and was small. Addendum- just had large BM- stopped Sorbitol 14. Insomnia  3/18- "out of it" this AM- will decrease melatonin to 3 mg QHS    3/19- more "with it" this AM, but still sleepy- not a morning person per pt.  15. Hyponatremia  3/19- Na down to 129- not sure why- but first time below 132- will recheck in AM and treat as appropriate.   I spent a total of 37   minutes on total care today- >50% coordination of care- due to  D/w pt about constipation and sedation- also team conference to determine  length of stay  LOS: 5 days A FACE TO [place] 12/27/2022, 8:33 AM

## 2022-12-28 LAB — BASIC METABOLIC PANEL
Anion gap: 8 (ref 5–15)
BUN: 9 mg/dL (ref 8–23)
CO2: 23 mmol/L (ref 22–32)
Calcium: 9.3 mg/dL (ref 8.9–10.3)
Chloride: 99 mmol/L (ref 98–111)
Creatinine, Ser: 0.46 mg/dL — ABNORMAL LOW (ref 0.61–1.24)
GFR, Estimated: >60 mL/min (ref >60)
Glucose, Bld: 100 mg/dL — ABNORMAL HIGH (ref 70–99)
Potassium: 4.1 mmol/L (ref 3.5–5.1)
Sodium: 130 mmol/L — ABNORMAL LOW (ref 135–145)

## 2022-12-28 MED ORDER — OXYBUTYNIN CHLORIDE 5 MG PO TABS
5.0000 mg | ORAL_TABLET | Freq: Every day | ORAL | Status: DC
  Filled 2022-12-28: qty 1

## 2022-12-28 MED ORDER — DICLOFENAC SODIUM 1 % EX GEL
2.0000 g | Freq: Four times a day (QID) | CUTANEOUS | Status: DC
  Administered 2022-12-28 – 2022-12-30 (×3): 2 g via TOPICAL
  Filled 2022-12-28: qty 100

## 2022-12-28 NOTE — Progress Notes (Signed)
Occupational Therapy Session Note  Patient Details  Name: Samuel [name] MRN: [id number] Date of Birth: 1961-08-31  Today's Date: 12/28/2022 OT Individual Time: [id number] OT Individual Time Calculation (min): 70 min    Short Term Goals: Week 1:  OT Short Term Goal 1 (Week 1): STG=LTG d/t ELOS  Skilled Therapeutic Interventions/Progress Updates:    Pt resting in bed upon arrival. Supine>sit EOB with supervision. Pt amb with RW to gym. Standing activity using BUE to place Squigz on window and replace in container. Pt stood on Airex to clean all items. CGA for standing balance on AirEx. Pt completed small peg pattern on peg board. Pt dropped ~5 pegs but was able to retrieve. Pt removed 9 small beads from red theraputty and replaced in theraputty. Pt dropped 2 beads. Pt reports he still has numbness in BUE but is improving. Pt amb with RW back to room and returned to bed. Bed alarm activated. All needs within reach.   Therapy Documentation Precautions:  Precautions Precautions: Fall, Cervical Precaution Booklet Issued: Yes (comment) Precaution Comments: reviewed precautions. Required Braces or Orthoses: Cervical Brace Cervical Brace: Hard collar Restrictions Weight Bearing Restrictions: No Other Position/Activity Restrictions: Continue to reposition brace as pt likes to wear it loose.  Pain:  Pt denies pain this morning   Therapy/Group: Individual Therapy  [name], Caylor 12/28/2022, 12:07 PM

## 2022-12-28 NOTE — Progress Notes (Signed)
Occupational Therapy Session Note  Patient Details  Name: Samuel [name] MRN: [id number] Date of Birth: 11/17/1960  Today's Date: 12/28/2022 OT Individual Time: [id number] OT Individual Time Calculation (min): 74 min    Short Term Goals: Week 1:  OT Short Term Goal 1 (Week 1): STG=LTG d/t ELOS  Skilled Therapeutic Interventions/Progress Updates:    Pt asleep upon arrival but easily aroused. OT intervention with focus on functional amb with RW, bathing at shower level, toileting, dressing with sit<>stand from EOB, self feeding, standing balance, discharge planning, and safety awareness to increase independence with BADLs. All amb with RW in room with supervision. Pt amb into bathroom and stood at toilet to void before transferring to walki-in shower, Bathing at supervision level. Pt returned to room and completed dressing with sit<>stand from EOB. Pt amb with RW to sink and stood at sink to brush teeth and complete grooming tasks. Pt returned to EOB to eat breakfast. Pt using foam tubing to adapt utensils. Pt able to open containers without assistance. Pt transferred to recliner at end of session. Belt alarm activated. All needs within reach.  Therapy Documentation Precautions:  Precautions Precautions: Fall, Cervical Precaution Booklet Issued: Yes (comment) Precaution Comments: reviewed precautions. Required Braces or Orthoses: Cervical Brace Cervical Brace: Hard collar Restrictions Weight Bearing Restrictions: No Other Position/Activity Restrictions: Continue to reposition brace as pt likes to wear it loose. General:   Vital Signs:  Pain:  Pt reports posterior neck discomfort; MD aware   Therapy/Group: Individual Therapy  Dwayne, Kleinert 12/28/2022, 7:58 AM

## 2022-12-28 NOTE — Progress Notes (Signed)
Physical Therapy Session Note  Patient Details  Name: Samuel [name] MRN: [id number] Date of Birth: [birth year]  Today's Date: 12/28/2022 PT Individual Time: 0932-1014 PT Individual Time Calculation (min): 42 min   Short Term Goals: Week 1:  PT Short Term Goal 1 (Week 1): STGs = LTGs  Skilled Therapeutic Interventions/Progress Updates:   Received pt sitting in recliner, pt agreeable to PT treatment, and denied any pain during session. Session with emphasis on functional mobility/transfers, generalized strengthening and endurance, dynamic standing balance/coordination, NMR, and gait training. Pt stood with RW and CGA/close supervision and ambulated 1110ft x 2 trials with RW and CGA to/from main therapy gym. Pt performed 2x15 LAQ and 2x15 hip flexion with 2.5lb ankle weight during active seated rest break. Pt then performed the following activities inside // bars with emphasis on dynamic standing balance and coordination: -forward high knee marching with LUE support x 4 laps with min A for balance -lateral side stepping with no UE support x 4 laps with CGA for balance -forward marching<>lateral cone taps x 4 laps with 1 UE support and CGA for balance -static standing balance on Bosu without UE support 3x30 seconds Returned to room and pt requested to return to bed. Doffed shoes with max A and transferred sit<>supine with supervision. Concluded session with pt semi-reclined in bed, needs within reach, and bed alarm on.   Therapy Documentation Precautions:  Precautions Precautions: Fall, Cervical Precaution Booklet Issued: Yes (comment) Precaution Comments: reviewed precautions. Required Braces or Orthoses: Cervical Brace Cervical Brace: Hard collar Restrictions Weight Bearing Restrictions: No Other Position/Activity Restrictions: Continue to reposition brace as pt likes to wear it loose.   Therapy/Group: Individual Therapy Alfonse Alpers PT, DPT  12/28/2022, 7:08 AM

## 2022-12-28 NOTE — Progress Notes (Signed)
PROGRESS NOTE   Subjective/Complaints:  Pt reports he wants to leave Friday- he says will have 24 A at home and someone to watch him.   Therapy feels he still needs more A than pt wants at this time and want d/c Tuesday.   Pt also reports still real sore/but thinks it's the cervical collar.  Also notes that needing to pee q30-60 minutes - very frequent.   ROS:   Pt denies SOB, abd pain, CP, N/V/C/D, and vision changes   Except for HPI   Objective:   No results found. Recent Labs    12/26/22 0825  WBC 3.9*  HGB 11.5*  HCT 34.8*  PLT 294   Recent Labs    12/26/22 0825 12/28/22 0548  NA 129* 130*  K 3.9 4.1  CL 96* 99  CO2 25 23  GLUCOSE 217* 100*  BUN 8 9  CREATININE 0.64 0.46*  CALCIUM 9.1 9.3    Intake/Output Summary (Last 24 hours) at 12/28/2022 1640 Last data filed at 12/28/2022 1416 Gross per 24 hour  Intake 1074 ml  Output 825 ml  Net 249 ml        Physical Exam: Vital Signs Blood pressure (!) 149/80, pulse 63, temperature 97.8 F (36.6 C), temperature source Oral, resp. rate 16, height 5\' 5"  (1.651 m), weight 41.8 kg, SpO2 100 %.     General: awake, alert, appropriate, sitting up EOB with OTA in room; cachetic still; NAD HENT: conjugate gaze; oropharynx dry CV: regular rate; no JVD Pulmonary: CTA B/L; no W/R/R- good air movement GI: soft, NT, ND, (+)BS Psychiatric: appropriate- more interactive Neurological: Ox3  Musculoskeletal:     Comments: UE strength- biceps 4+/5; WE 4+ on R and 4/5 on L; triceps 4/5; Grip 3+/5; and FA 3+/5 B/L LE strength- HF 4/5; KE 4+/5; DF 4/5; and PF 4+/5 B/L   Skin:    General: Skin is warm and dry.     Comments: Cervical incision per neck -covered in dried blood and Steri-Strips, appears well-approximated Bruising and scabs between legs from prior use of brief  Neurological:     Mental Status: He is alert and oriented to person, place, and time.      Comments: Soft voice with moderate dysarthria and slurring at times. He was able to follow simple commands without difficulty. BUE weakness with sensory deficits. BLE with significant ataxia.  C6 sensory level decreased to light touch down to T2- torso is intact per pt- but L1-S4 is decreased to light touch   Assessment/Plan: 1. Functional deficits which require 3+ hours per day of interdisciplinary therapy in a comprehensive inpatient rehab setting. Physiatrist is providing close team supervision and 24 hour management of active medical problems listed below. Physiatrist and rehab team continue to assess barriers to discharge/monitor patient progress toward functional and medical goals  Care Tool:  Bathing    Body parts bathed by patient: Right arm, Left arm, Chest, Abdomen, Front perineal area, Buttocks, Right upper leg, Left upper leg, Right lower leg, Left lower leg, Face         Bathing assist Assist Level: Supervision/Verbal cueing     Upper Body Dressing/Undressing Upper body dressing  What is the patient wearing?: Pull over shirt, Orthosis Orthosis activity level: Performed by helper  Upper body assist Assist Level: Contact Guard/Touching assist    Lower Body Dressing/Undressing Lower body dressing      What is the patient wearing?: Pants, Underwear/pull up     Lower body assist Assist for lower body dressing: Supervision/Verbal cueing     Toileting Toileting    Toileting assist Assist for toileting: Supervision/Verbal cueing     Transfers Chair/bed transfer  Transfers assist     Chair/bed transfer assist level: Contact Guard/Touching assist     Locomotion Ambulation   Ambulation assist      Assist level: Contact Guard/Touching assist Assistive device: Walker-rolling Max distance: 182ft   Walk 10 feet activity   Assist     Assist level: Contact Guard/Touching assist Assistive device: Walker-rolling   Walk 50 feet activity   Assist     Assist level: Contact Guard/Touching assist Assistive device: Walker-rolling    Walk 150 feet activity   Assist Walk 150 feet activity did not occur: Safety/medical concerns  Assist level: Contact Guard/Touching assist Assistive device: Walker-rolling    Walk 10 feet on uneven surface  activity   Assist     Assist level: Moderate Assistance - Patient - 50 - 74% Assistive device: Other (comment) (Hand rail)   Wheelchair     Assist Is the patient using a wheelchair?: Yes Type of Wheelchair: Manual    Wheelchair assist level: Dependent - Patient 0% (Only used as transport chair) Max wheelchair distance: 150'    Wheelchair 50 feet with 2 turns activity    Assist        Assist Level: Dependent - Patient 0%   Wheelchair 150 feet activity     Assist      Assist Level: Dependent - Patient 0%   Blood pressure (!) 149/80, pulse 63, temperature 97.8 F (36.6 C), temperature source Oral, resp. rate 16, height 5\' 5"  (1.651 m), weight 41.8 kg, SpO2 100 %.  Medical Problem List and Plan: 1. 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  2.  Antithrombotics: -DVT/anticoagulation:  Pharmaceutical: Lovenox             -antiplatelet therapy: N/a 3. Pain Management: hydrocodone prn.              --will add gabapentin for neuropathic symptoms.    3/15- says mainly taking tylenol- since hx of addiction- trying to avoid.   3/18- sore this AM, but tolerable  3/19- sore/tired this Am- therapy pushed him yesterday- con't regimen  3/20- collar is his main issue, but tolerating pain/soreness 4. Mood/Behavior/Sleep: LCSW to follow for evaluation and support.              -antipsychotic agents:  N/A             --Discussed compliance with collar. Melatonin added for insomnia.Don't want to do Trazodone because concern for urinary retention   3/19- decreased melatonin to 3 mg QHS 5. Neuropsych/cognition: This patient is capable of making decisions on his own behalf. 6. Skin/Wound  Care: Routine pressure relief measures.  7. Fluids/Electrolytes/Nutrition: Monitor I/O. Check CMET in am 8. ABLA: Recheck CBC in am  3/15- Hb stable at 10.6  3/18- Hb 11.5 9. Electrolyte abnormalities: Recheck BMET in am to follow up on Sodium and potassium levels. 10. Low calorie malnutrition: Continue ensure bid.  3/18- Albumin 3.3 11. Dysphagia: Discussed edema/surgery leading to aspiration risk/PNA --patient does not want diet modified. Went over risks of pneumonia, and spesis, etc- pt emphatic doesn't want nectar thick liquids 12. Neurogenic bladder/Frequency: Will check voiding function with bladder scan for PVRs. - sounds like is automatically double voiding- will start Flomax 0.4 mg qsupper  3/15- PVR's 0-6cc- so doing well  3/18- PVRs low- voiding well  3/19- voiding well- no PVRS/Bladder scans anymore- will d/c  3/20- still having frequency- will stop Flomax- see if that helps frequency, before starting Oxybutynin 13. Bowel urgency with constipation: Will reduce colace to once daily and monitor. But also feels constipated- will see if needs to be cleaned out?  3/15- will give sorbitol- since pt feels constipated- 45 cc- doesn't want enema or Suppository- did have large BM             3/16 - Small bowel movement overnight, refused sorbitol, increase Colace to twice daily and MiraLAX to daily,  3/17 -Medium bowel movement this a.m., feels much improved  3/19- added Senna 2 tabs qday and will try Sorbitol again after therapy- LBM 2 days ago and was small. Addendum- just had large BM- stopped Sorbitol 14. Insomnia  3/18- "out of it" this AM- will decrease melatonin to 3 mg QHS    3/19- more "with it"  this AM, but still sleepy- not a morning person per pt.  15. Hyponatremia  3/19- Na down to 129- not sure why- but first time below 132- will recheck in AM and treat as appropriate.   3/20- Na 130- back up slightly- will con't to monitor   I spent a total of 36   minutes on total care today- >50% coordination of care- due to  D/w OTA- pt wants to leave Friday, therapy thinks he needs to stay til Tuesday as scheduled- OT will let me know what they think after day fo therapy- Also d/w pt about ways to help urinary frequency  LOS: 6 days A FACE TO FACE EVALUATION WAS PERFORMED  Leonidus Rowand 12/28/2022, 4:40 PM

## 2022-12-29 ENCOUNTER — Inpatient Hospital Stay (HOSPITAL_COMMUNITY): Payer: Medicaid Other

## 2022-12-29 ENCOUNTER — Encounter: Payer: Medicaid Other | Admitting: Orthopedic Surgery

## 2022-12-29 NOTE — Progress Notes (Addendum)
Physical Therapy Session Note  Patient Details  Name: Samuel [name] MRN: [id number] Date of Birth: 1961/10/10  Today's Date: 12/29/2022 PT Individual Time: 0900-1015, 1300-1350 PT Individual Time Calculation (min): 75 min, 50 min   Short Term Goals: Week 1:  PT Short Term Goal 1 (Week 1): STGs = LTGs  Skilled Therapeutic Interventions/Progress Updates:    Session 1:Pt received in recliner and agreeable to therapy.  No c/o pain at start  of session, but neck pain with activity. Pt found to have triggerpoints in R upper traps and possible levator scap and supraspinatus, difficult to assess. Provided manual therapy with pt reporting relief and education on triggerpoint related pain. Pt ambulated <>gym with CGA-supervision with RW. Mildly ataxic gait, 1 small LOB requiring no more than CGA to recover. Pt navigated stairs with 2 hand rails. Required CGA overall, but one slight knee buckle that pt was able to self recover. Guarded knees on following descent for safety, but no repeated buckling. Pt navigated 6" stairs 3 x 12 for endurance and improved stability. Pt participated in floor transfer with CGA fading to supervision. Educated on activating EMS if necessary. Pt Progressed to half kneel<>stand x 10 BIL, required BIL UE support to initiate stand. Noted LLE with increased difficulty vs RLE. Pt progressed to //bars and performed lunges 3 x 20 alternating, with UUE support. Noted lateral R knee instability, improved with cueing. Pt also participated in several activities on rocker board for improved coordination and balance. Pt returned to room and sat in recliner, was left with all needs in reach and alarm active.   Session 2: Pt received in recliner and agreeable to therapy.  No complaint of pain. Pt ambulated with RW and CGA ro day room, demoes improving ataxia with gait. Pt ambulated x 60 ft with out AD. Noted moderate increase in ataxia and requires up to mod A for balance. Added ankle weights x  4lbs and pt was able to navigate x 60 ft and 2 x 150 ft with min A overall and improved ataxia but still > than with RW. Pt then participated in horseshoes x 3 rounds. Pt retrieved horseshoes from ground without AD and then ambulated to pick up thrown horse shoes with RW, CGA overall. Pt returned to room and to bed, was left with all needs in reach and alarm active.   Therapy Documentation Precautions:  Precautions Precautions: Fall, Cervical Precaution Booklet Issued: Yes (comment) Precaution Comments: reviewed precautions. Required Braces or Orthoses: Cervical Brace Cervical Brace: Hard collar Restrictions Weight Bearing Restrictions: No Other Position/Activity Restrictions: Continue to reposition brace as pt likes to wear it loose. General:       Therapy/Group: Individual Therapy  Mickel Fuchs 12/29/2022, 10:12 AM

## 2022-12-29 NOTE — Progress Notes (Signed)
Occupational Therapy Session Note  Patient Details  Name: Samuel [name] MRN: [id number] Date of Birth: [birth year]  Today's Date: 12/29/2022 OT Individual Time: 0700-0810 OT Individual Time Calculation (min): 70 min    Short Term Goals: Week 1:  OT Short Term Goal 1 (Week 1): STG=LTG d/t ELOS  Skilled Therapeutic Interventions/Progress Updates:    OT intervention with focus on functional amb with RW in home environment, bathing at shower level, dressing with sit<>stand from EOB, self feeding, standing balance for grooming tasks, and safety awareness to increase pt's independence with BADLs and prepare for discharge home. All amb in room with RW at supervision level. Bathing with sit<>stand from shower seat with supervision. Dressing with sit<>stand from EOB with supervision. Pt amb with RW to sink and stood at sink to complete grooming tasks-supervision. Pt using foam tubing to adapt utensils. Pt able to open all containers without assistance. Self feeding with superviison. No coughing during meal. Pt remained seated in recliner with all needs within reach. Belt alarm activated.   Therapy Documentation Precautions:  Precautions Precautions: Fall, Cervical Precaution Booklet Issued: Yes (comment) Precaution Comments: reviewed precautions. Required Braces or Orthoses: Cervical Brace Cervical Brace: Hard collar Restrictions Weight Bearing Restrictions: No Other Position/Activity Restrictions: Continue to reposition brace as pt likes to wear it loose. Pain: Pt reports 1/10 posterior neck discomfort; MD aware and warm shower   Therapy/Group: Individual Therapy  Leslee, Lehr 12/29/2022, 7:50 AM

## 2022-12-29 NOTE — Progress Notes (Signed)
PROGRESS NOTE   Subjective/Complaints:  Pt reports LBM yesterday Neck pain only 1/10 Good sleep so sounds like urination not as bad.  Only got voltaren gel x1 but thinks was helpful.    ROS:  Pt denies SOB, abd pain, CP, N/V/C/D, and vision changes  Except for HPI   Objective:   No results found. No results for input(s): "WBC", "HGB", "HCT", "PLT" in the last 72 hours.  Recent Labs    12/28/22 0548  NA 130*  K 4.1  CL 99  CO2 23  GLUCOSE 100*  BUN 9  CREATININE 0.46*  CALCIUM 9.3    Intake/Output Summary (Last 24 hours) at 12/29/2022 0915 Last data filed at 12/29/2022 [id number] Gross per 24 hour  Intake 772 ml  Output 1525 ml  Net -753 ml        Physical Exam: Vital Signs Blood pressure 126/76, pulse (!) 58, temperature 97.7 F (36.5 C), temperature source Oral, resp. rate 16, height 5\' 5"  (1.651 m), weight 41.8 kg, SpO2 100 %.      General: awake, alert, appropriate, in shower with OTA; NAD HENT: conjugate gaze; oropharynx dry- wearing cervical collar- incision with steristrips- looks good CV: regular rate and rhythm; no JVD Pulmonary: CTA B/L; no W/R/R- good air movement GI: soft, NT, ND, (+)BS- normoactive Psychiatric: appropriate Neurological: Ox3  Musculoskeletal:     Comments: UE strength- biceps 4+/5; WE 4+ on R and 4/5 on L; triceps 4/5; Grip 3+/5; and FA 3+/5 B/L LE strength- HF 4/5; KE 4+/5; DF 4/5; and PF 4+/5 B/L   Skin:    General: Skin is warm and dry.     Comments: Cervical incision per neck -covered in dried blood and Steri-Strips, appears well-approximated Bruising and scabs between legs from prior use of brief  Neurological:     Mental Status: He is alert and oriented to person, place, and time.     Comments: Soft voice with moderate dysarthria and slurring at times. He was able to follow simple commands without difficulty. BUE weakness with sensory deficits. BLE with significant  ataxia.  C6 sensory level decreased to light touch down to T2- torso is intact per pt- but L1-S4 is decreased to light touch   Assessment/Plan: 1. Functional deficits which require 3+ hours per day of interdisciplinary therapy in a comprehensive inpatient rehab setting. Physiatrist is providing close team supervision and 24 hour management of active medical problems listed below. Physiatrist and rehab team continue to assess barriers to discharge/monitor patient progress toward functional and medical goals  Care Tool:  Bathing    Body parts bathed by patient: Right arm, Left arm, Chest, Abdomen, Front perineal area, Buttocks, Right upper leg, Left upper leg, Right lower leg, Left lower leg, Face         Bathing assist Assist Level: Supervision/Verbal cueing     Upper Body Dressing/Undressing Upper body dressing   What is the patient wearing?: Pull over shirt, Orthosis Orthosis activity level: Performed by helper  Upper body assist Assist Level: Minimal Assistance - Patient > 75%    Lower Body Dressing/Undressing Lower body dressing      What is the patient wearing?: Pants, Underwear/pull  up     Lower body assist Assist for lower body dressing: Supervision/Verbal cueing     Toileting Toileting    Toileting assist Assist for toileting: Supervision/Verbal cueing     Transfers Chair/bed transfer  Transfers assist     Chair/bed transfer assist level: Contact Guard/Touching assist     Locomotion Ambulation   Ambulation assist      Assist level: Contact Guard/Touching assist Assistive device: Walker-rolling Max distance: 129ft   Walk 10 feet activity   Assist     Assist level: Contact Guard/Touching assist Assistive device: Walker-rolling   Walk 50 feet activity   Assist    Assist level: Contact Guard/Touching assist Assistive device: Walker-rolling    Walk 150 feet activity   Assist Walk 150 feet activity did not occur: Safety/medical  concerns  Assist level: Contact Guard/Touching assist Assistive device: Walker-rolling    Walk 10 feet on uneven surface  activity   Assist     Assist level: Moderate Assistance - Patient - 50 - 74% Assistive device: Other (comment) (Hand rail)   Wheelchair     Assist Is the patient using a wheelchair?: Yes Type of Wheelchair: Manual    Wheelchair assist level: Dependent - Patient 0% (Only used as transport chair) Max wheelchair distance: 150'    Wheelchair 50 feet with 2 turns activity    Assist        Assist Level: Dependent - Patient 0%   Wheelchair 150 feet activity     Assist      Assist Level: Dependent - Patient 0%   Blood pressure 126/76, pulse (!) 58, temperature 97.7 F (36.5 C), temperature source Oral, resp. rate 16, height 5\' 5"  (1.651 m), weight 41.8 kg, SpO2 100 %.  Medical Problem List and Plan: 1. 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  Antithrombotics: -DVT/anticoagulation:  Pharmaceutical: Lovenox             -antiplatelet therapy: N/a 3. Pain Management: hydrocodone prn.              --will add gabapentin for neuropathic symptoms.    3/15- says mainly taking tylenol- since hx of addiction- trying to avoid.   3/18- sore this AM, but tolerable  3/19- sore/tired this Am- therapy pushed him yesterday- con't regimen  3/20- collar is his main issue, but tolerating pain/soreness  3/21- says pain almost  gone- 1/10 this AM- did get volateren x1- will make sure QID scheduled 4. Mood/Behavior/Sleep: LCSW to follow for evaluation and support.              -antipsychotic agents: N/A             --Discussed compliance with collar. Melatonin added for insomnia.Don't want to do Trazodone because concern  for urinary retention   3/19- decreased melatonin to 3 mg QHS 5. Neuropsych/cognition: This patient is capable of making decisions on his own behalf. 6. Skin/Wound Care: Routine pressure relief measures.  7. Fluids/Electrolytes/Nutrition: Monitor I/O. Check CMET in am 8. ABLA: Recheck CBC in am  3/15- Hb stable at 10.6  3/18- Hb 11.5 9. Electrolyte abnormalities: Recheck BMET  in am to follow up on Sodium and potassium levels. 10. Low calorie severe malnutrition: BMI 15Continue ensure bid.  3/18- Albumin 3.3 but BMI 15- needs to gain some weight- doing supplements 11. Dysphagia: Discussed edema/surgery leading to aspiration risk/PNA --patient does not want diet modified. Went over risks of pneumonia, and spesis, etc- pt emphatic doesn't want nectar thick liquids 12. Neurogenic bladder/Frequency: Will check voiding function with bladder scan for PVRs. - sounds like is automatically double voiding- will start Flomax 0.4 mg qsupper  3/15- PVR's 0-6cc- so doing well  3/18- PVRs low- voiding well  3/19- voiding well- no PVRS/Bladder scans anymore- will d/c  3/20- still having frequency- will stop Flomax- see if that helps frequency, before starting Oxybutynin  3/21- think it's better- slept better with Flomax off.  13. Bowel urgency with constipation: Will reduce colace to once daily and monitor. But also feels constipated- will see if needs to be cleaned out?  3/15- will give sorbitol- since pt feels constipated- 45 cc- doesn't want enema or Suppository- did have large BM             3/16 - Small bowel movement overnight, refused sorbitol, increase Colace to twice daily and MiraLAX to daily,  3/17 -Medium bowel movement this a.m., feels much improved  3/19- added Senna 2 tabs qday and will try Sorbitol again after therapy- LBM 2 days ago and was small. Addendum- just had large BM- stopped Sorbitol order  3/21- LBM yesterday 14. Insomnia  3/18- "out of it" this AM- will decrease melatonin to 3 mg  QHS    3/19- more "with it" this AM, but still sleepy- not a morning person per pt.  15. Hyponatremia  3/19- Na down to 129- not sure why- but first time below 132- will recheck in AM and treat as appropriate.   3/20- Na 130- back up slightly- will con't to monitor  3/21- will check Monday   Pt has agreed that he needs to stay til 3/26- not happy, but willing.     LOS: 7 days A FACE TO FACE EVALUATION WAS PERFORMED  Quinzell Malcomb 12/29/2022, 9:15 AM

## 2022-12-29 NOTE — Progress Notes (Signed)
Occupational Therapy Weekly Progress Note  Patient Details  Name: Samuel [name] MRN: [id number] Date of Birth: March 13, 1961  Beginning of progress report period: December 23, 2022 End of progress report period: December 29, 2022    Pt is making steady progress with BADLs and functional transfers since admission. Pt currently completes bathing at shower level and dressing with supervision. All functional amb with RW and toileting tasks with supervision. Ataxia improving.Pt requires min verbal cues for safety awareness.   Patient continues to demonstrate the following deficits: muscle weakness, muscle joint tightness, and muscle paralysis, decreased cardiorespiratoy endurance, impaired timing and sequencing, abnormal tone, unbalanced muscle activation, ataxia, decreased coordination, and decreased motor planning, and decreased sitting balance, decreased standing balance, decreased postural control, and decreased balance strategies and therefore will continue to benefit from skilled OT intervention to enhance overall performance with BADL, iADL, and Reduce care partner burden.  Patient progressing toward long term goals..  Continue plan of care.  OT Short Term Goals Week 2:  OT Short Term Goal 1 (Week 2): STG=LTG d/t ELOS     [name], Koltes 12/29/2022, 7:55 AM

## 2022-12-30 NOTE — Discharge Instructions (Addendum)
Orthopedic Surgery Discharge Instructions  Patient name: Samuel [name] Procedure Performed: C3/4 anterior cervical discectomy and fusion Diagnosis: cervical myelopathy Date of Surgery: 12/16/2022 Surgeon: Ileene Rubens, MD  Activity: You should wear your cervical collar for six weeks after surgery. It is okay to remove the cervical collar when taking a shower, but you should have it on at all other times. You should refrain from bending, lifting, or twisting with objects greater than ten pounds until three months after surgery. You are encouraged to walk as much as desired. You can perform household activities such as cleaning dishes, doing laundry, vacuuming, etc. as long as the ten-pound restriction is followed.  Incision Care: You can leave your incision open to air. You do not need to place a dressing over it. It is okay to let soap and water run over your incision. Do not submerge (e.g., take a bath, swim, go in a hot tub, etc.) until six weeks after surgery.  Medications: You have been prescribed oxycodone. This is a narcotic pain medication and should only be taken as prescribed. You should not drink alcohol or operate heavy machinery (including driving) while taking this medication. The oxycodone can cause constipation as a side effect. For that reason, you have been prescribed senna and miralax. These are both laxatives. You do not need to take this medication if you develop diarrhea. Should you remain constipated even while taking these medications, please increase the dose of miralax to twice daily. Tylenol has been prescribed to be taken every 8 hours, which will give you additional pain relief. Robaxin is a muscle relaxer that has been prescribed to you for muscle spasm type pain. Take this medication as needed.   Do not take NSAIDs (ibuprofen, Aleve, Celebrex, naproxen, meloxicam, etc.) for the first 6 weeks after surgery as there is some evidence that their use may decrease the chances  of successful fusion.   You may resume any home blood thinners (aspirin, warfarin, lovenox, apixaban, plavix, xarelto, etc.) 72 hours after your surgery. Take these medications as they were previously prescribed.   Driving: You should not drive while taking narcotic pain medications. You should also not drive if you feel you cannot turn your body enough to check your blind spots while in your cervical collar. In this case, you should wait six weeks to drive when your cervical collar will be discontinued. You should start getting back to driving slowly and you may want to try driving in a parking lot before doing anything more. Please be mindful that in some states it is illegal to drive with a cervical collar in place. You should check your local laws before driving and you may have to wait until the collar is discontinued before driving.  Reasons to Call the Office After Surgery: You should feel free to call the office with any concerns or questions you have in the post-operative period, but you should definitely notify the office if you develop: -shortness of breath, chest pain, or trouble breathing -excessive bleeding, drainage, redness, or swelling around the surgical site -fevers, chills, or pain that is getting worse with each passing day -persistent nausea or vomiting -new weakness in any extremity, new or worsening numbness or tingling in any extremity -numbness in the groin, bowel or bladder incontinence -other concerns about your surgery  Follow Up Appointments: You need to call the office to schedule a follow up appointment. You should schedule your follow up visit around 01/30/2023 (which is roughly six weeks from your  surgery).   Office Information:  -Ileene Rubens, MD -Phone number: [phone] -Address: [address]       [place], [place] [address]

## 2022-12-30 NOTE — Progress Notes (Signed)
Occupational Therapy Session Note  Patient Details  Name: CALIBER OMANA MRN: [id number] Date of Birth: December 04, 1960  Today's Date: 12/30/2022 OT Individual Time: 1350-1429 OT Individual Time Calculation (min): 39 min    Short Term Goals: Week 1:  OT Short Term Goal 1 (Week 1): STG=LTG d/t ELOS  Skilled Therapeutic Interventions/Progress Updates:    Pt received in bed with no pain. Pt dons shoes at EOB with set up and completes functional mobility with RW into bathroom, to sink and to gym with set up. Pt stands for bladder void with S overall. In tx gym, focus on [place]. Palm<>finger translation of connect 4 pieces with increased grip slips RUE. Unable to stack chips. 9HPT RUE 44.1 seconds LUE 2 min 19 seconds. Seated push pin activivity placing onto matching colors with RUE and removing with LUE.  Pt left at end of session in bed with exit alarm on, call light in reach and all needs met   Therapy Documentation Precautions:  Precautions Precautions: Fall, Cervical Precaution Booklet Issued: Yes (comment) Precaution Comments: reviewed precautions. Required Braces or Orthoses: Cervical Brace Cervical Brace: Hard collar Restrictions Weight Bearing Restrictions: No Other Position/Activity Restrictions: Continue to reposition brace as pt likes to wear it loose. General:   Therapy/Group: Individual Therapy  Tonny Branch 12/30/2022, 6:48 AM

## 2022-12-30 NOTE — Progress Notes (Signed)
Physical Therapy Session Note  Patient Details  Name: Samuel [name] MRN: [id number] Date of Birth: Apr 17, 1961  Today's Date: 12/30/2022 PT Individual Time: 0900-1015 PT Individual Time Calculation (min): 75 min   Short Term Goals: Week 1:  PT Short Term Goal 1 (Week 1): STGs = LTGs  Skilled Therapeutic Interventions/Progress Updates:    Pt received in recliner and agreeable to therapy.  Pt reports muscle pain in his Bil neck and shoulder pain, addressed with manual therapy during session. Pt reports dissatisfaction with fit of c-collar. Discuss that pt needs to tighten collar to prevent rubbing under chin and/or smaller size to actually immobilize, but pt refuses stating "then I can't breathe." Discussed risks of not wearing as directed and not maintaining cervical precautions. When therapist removed to examine skin, pt immediately turned head despite instructions not to move neck. Skin intact and no signs of redness. Pt resistant/refusing education on c-collar and throughout session made statements about discomfort, despite fit adjusted Gait <>day room with close supervision and RW, one instance of R foot drag, discussed fall risks in the home. Kinetron in standing at 20 cm/sec 3 x 2 min for balance and smooth reciprocal motion. Sit to stand on kinetron plates with cues for upright posture, keeping legs away seat to actually find balance point. Multimodal cueing for Sit to stand education to keep weight forward into toes and shoulders over hips, pt able to achieve last few reps and demoed greatly improved balance and stability.  Pt then returned to room in the same manner. Demoes improved gait stability with improved posture. Therapist provided triggerpoint release at BIL upper traps, levator scap, scalenes and supra spinatus. Pt reports relief after intervention. Pt provided with theracane and educated on appropriate use, pt excited to use. Pt then ambulated to bathroom for continent void with  supervision for 3/3 toileting tasks. Returned to bed after session and was left with all needs in reach and alarm active.   Therapy Documentation Precautions:  Precautions Precautions: Fall, Cervical Precaution Booklet Issued: Yes (comment) Precaution Comments: reviewed precautions. Required Braces or Orthoses: Cervical Brace Cervical Brace: Hard collar Restrictions Weight Bearing Restrictions: No Other Position/Activity Restrictions: Continue to reposition brace as pt likes to wear it loose. General:       Therapy/Group: Individual Therapy  Mickel Fuchs 12/30/2022, 9:16 AM

## 2022-12-30 NOTE — Progress Notes (Signed)
Occupational Therapy Session Note  Patient Details  Name: Samuel [name] MRN: [id number] Date of Birth: 26-Jul-1961  Today's Date: 12/30/2022 OT Individual Time: 0700-0810 OT Individual Time Calculation (min): 70 min    Short Term Goals: Week 2:  OT Short Term Goal 1 (Week 2): STG=LTG d/t ELOS  Skilled Therapeutic Interventions/Progress Updates:    Pt resting in bed upon arrival. OT intervention with focus on functional amb with RW, bathing/dressing, educaiton, and safety awareness to increase independence with bADLs. All amb with RW at supervision level. Bathing at shower level with sit<>stand from shower seat. Dressing with sit<>stand from EOB with supervision. Pt requires assistance with doffing/donning Aspen collar 2/2 decreased sensation in BUE. Pt completed eating breakfast using adaptive utensils. Pt verbalized strategy to eat slowly with hard swallows. Pt amb with RW to ADL apartment for bed mobility tranfsers with supervision. Pt returned to room. Pt educated on changing pads on Aspen collar. Pt remained in recliner with belt alarm actiated. All needs wihtin reach.   Therapy Documentation Precautions:  Precautions Precautions: Fall, Cervical Precaution Booklet Issued: Yes (comment) Precaution Comments: reviewed precautions. Required Braces or Orthoses: Cervical Brace Cervical Brace: Hard collar Restrictions Weight Bearing Restrictions: No Other Position/Activity Restrictions: Continue to reposition brace as pt likes to wear it loose.   Pain:  Pt denies pain this morning    Therapy/Group: Individual Therapy  Jakolby, Hosek 12/30/2022, 8:12 AM

## 2022-12-30 NOTE — Progress Notes (Signed)
Occupational Therapy Session Note  Patient Details  Name: Samuel [name] MRN: [id number] Date of Birth: 23-Oct-1960  Today's Date: 12/30/2022 OT Individual Time: 1120-1200 OT Individual Time Calculation (min): 40 min (make up minutes)    Short Term Goals: Week 2:  OT Short Term Goal 1 (Week 2): STG=LTG d/t ELOS  Skilled Therapeutic Interventions/Progress Updates:   Make up minutes performed this am. Pt agreeable to additional OT session. Pt struggling with Aspen collar over his mouth upon OT arrival with nurse Danae Chen present calling ortho tech. OT able to remove and reapply in supine properly as collar had slipped up over his mouth while laying in bed. Pt was then repositioned upright in bed for session. OT applied moist heat via warm compress cloths to B  hands and soft tissue massage to hands. Pt overall with dry hands with pt reporting "everything feels so slipery and numb". Increased awareness reported and pt able to participate in simple hand coord/resistance activity of Velcro Checkers. Pt with intrinsic hand weakness Bly L>R. Pt able to place all game pieces on board with tip to tip pattern with R hand but required increased time and required key pinch pattern at times to remove with L hand. Overall significant improvement noted from session earlier week. Pt please with progress and reported no pain throughout session. Left pt in bed upright for lunch meal with exit alarm set, needs and nurse call button in reach.   Therapy Documentation Precautions:  Precautions Precautions: Fall, Cervical Precaution Booklet Issued: Yes (comment) Precaution Comments: reviewed precautions. Required Braces or Orthoses: Cervical Brace Cervical Brace: Hard collar Restrictions Weight Bearing Restrictions: No Other Position/Activity Restrictions: Continue to reposition brace as pt likes to wear it loose.   Therapy/Group: Individual Therapy  Barnabas Lister 12/30/2022, 11:59 AM

## 2022-12-30 NOTE — Progress Notes (Signed)
Orthopedic Surgery Post-operative Progress Note  Assessment: Patient is a 62 y.o. male who is currently in IPR after undergoing C3/4 ACDF Date of surgery: 12/16/2022 (~2 weeks post-op)   Plan: -Operative plans complete -Out of bed as tolerated with aspen collar -No bending/lifting/twisting greater than 10 pounds -Okay to leave incision open to air -Okay to let soap/water run over the incision, but do not submerge the wound -Regular diet -Follow up in office in approximately 4 weeks -Placed updated orthopedic discharge instructions in the discharge tab  _________________________________________________________________________  Subjective: No acute events since surgery. Has been in IPR working with therapy. Feels he has made improvement in his balance and strength. Reports that the sensation in his hands and legs is slightly better than before surgery but is still decreased. No new numbness or paresthesias. Feels his dysphagia has improved and he states he has been able to swallow his food easier.   Objective:  General: no acute distress, appropriate affect, eating breakfast this morning Neurologic: alert, answering questions appropriately, following commands Respiratory: unlabored breathing on room air Neck: no hematoma appreciated, collar loose this morning (readjusted) Skin: incision well approximated with no active or expressible drainage, no erythema or tenderness around the incision   MSK (spine):   -Strength exam                                                   Left                  Right Grip strength                4/5                  4/5 Interosseus                  4/5                  4/5 Wrist extension            4/5                  4/5 Wrist flexion                 4/5                  4/5 Elbow extension           4/5                  4/5 Elbow flexion                4/5                  4/5 Deltoid                          4/5                  4/5   EHL                               4/5                  4/5 TA  4/5                  4/5 GSC                             4/5                  4/5 Knee extension            4/5                  4/5 Knee flexion                 4/5                  4/5 Hip flexion                    4/5                  4/5   -Sensory exam                           Sensation intact to light touch in L3-S1 nerve distributions of bilateral lower extremities (decreased in all distributions)             Sensation intact to light touch in C5-T1 nerve distributions of bilateral upper extremities (decreased in all distributions)   XRs from 12/29/2022 were reviewed and independently interpreted, showing anterior instrumentation in similar position to immediately post-op films. Allograft in appropriate position.   Patient name: Samuel [name] Patient MRN: [id number] Date: 12/30/22

## 2022-12-30 NOTE — Progress Notes (Signed)
PROGRESS NOTE   Subjective/Complaints:  Surgeon just came to take steristrips off- needs f/u in 4 weeks.  LBM this Am but is loose per nursing, so will stop Colace and hold Senna.   No issues otherwise, per pt.   ROS:   Pt denies SOB, abd pain, CP, N/V/C/D, and vision changes  Except for HPI   Objective:   DG Cervical Spine 2 or 3 views  Result Date: 12/29/2022 CLINICAL DATA:  Postop EXAM: CERVICAL SPINE - 2-3 VIEW COMPARISON:  12/16/2022 FINDINGS: Anterior plate and screws at [id number] with intervening bone plug at the C3-C4 disc space. Scattered endplate spur formation and disc space narrowing in cervical spine. Bones appear demineralized. Mild cervicothoracic scoliosis apex LEFT. No fracture, subluxation, or bone destruction. IMPRESSION: Postoperative changes of anterior fusion C3-C4. Electronically Signed   By: Lavonia Dana M.D.   On: 12/29/2022 11:03   No results for input(s): "WBC", "HGB", "HCT", "PLT" in the last 72 hours.  Recent Labs    12/28/22 0548  NA 130*  K 4.1  CL 99  CO2 23  GLUCOSE 100*  BUN 9  CREATININE 0.46*  CALCIUM 9.3    Intake/Output Summary (Last 24 hours) at 12/30/2022 0814 Last data filed at 12/30/2022 0328 Gross per 24 hour  Intake 712 ml  Output 900 ml  Net -188 ml        Physical Exam: Vital Signs Blood pressure 132/86, pulse 69, temperature 97.7 F (36.5 C), temperature source Oral, resp. rate 18, height 5\' 5"  (1.651 m), weight 41.8 kg, SpO2 100 %.       General: awake, alert, appropriate, sitting up trying to eat breakfast in bedside chair; NAD HENT: conjugate gaze; oropharynx moist- wearing cervical collar- steristrips off CV: regular rate; no JVD Pulmonary: CTA B/L; no W/R/R- good air movement GI: soft, NT, ND, (+)BS- normoactive Psychiatric: appropriate Neurological: Ox3 Grip 4/5 B/L and FA 4-/5 on L and 4/5 on R- is really improving  Musculoskeletal:     Comments:  UE strength- biceps 4+/5; WE 4+ on R and 4/5 on L; triceps 4/5; Grip 3+/5; and FA 3+/5 B/L LE strength- HF 4/5; KE 4+/5; DF 4/5; and PF 4+/5 B/L   Skin:    General: Skin is warm and dry.     Comments: Cervical incision per neck -covered in dried blood and Steri-Strips, appears well-approximated Bruising and scabs between legs from prior use of brief  Neurological:     Mental Status: He is alert and oriented to person, place, and time.     Comments: Soft voice with moderate dysarthria and slurring at times. He was able to follow simple commands without difficulty. BUE weakness with sensory deficits. BLE with significant ataxia.  C6 sensory level decreased to light touch down to T2- torso is intact per pt- but L1-S4 is decreased to light touch   Assessment/Plan: 1. Functional deficits which require 3+ hours per day of interdisciplinary therapy in a comprehensive inpatient rehab setting. Physiatrist is providing close team supervision and 24 hour management of active medical problems listed below. Physiatrist and rehab team continue to assess barriers to discharge/monitor patient progress toward functional and medical goals  Care  Tool:  Bathing    Body parts bathed by patient: Right arm, Left arm, Chest, Abdomen, Front perineal area, Buttocks, Right upper leg, Left upper leg, Right lower leg, Left lower leg, Face         Bathing assist Assist Level: Supervision/Verbal cueing     Upper Body Dressing/Undressing Upper body dressing   What is the patient wearing?: Pull over shirt, Orthosis Orthosis activity level: Performed by helper  Upper body assist Assist Level: Minimal Assistance - Patient > 75%    Lower Body Dressing/Undressing Lower body dressing      What is the patient wearing?: Pants, Underwear/pull up     Lower body assist Assist for lower body dressing: Supervision/Verbal cueing     Toileting Toileting    Toileting assist Assist for toileting: Supervision/Verbal  cueing     Transfers Chair/bed transfer  Transfers assist     Chair/bed transfer assist level: Contact Guard/Touching assist     Locomotion Ambulation   Ambulation assist      Assist level: Contact Guard/Touching assist Assistive device: Walker-rolling Max distance: 143ft   Walk 10 feet activity   Assist     Assist level: Contact Guard/Touching assist Assistive device: Walker-rolling   Walk 50 feet activity   Assist    Assist level: Contact Guard/Touching assist Assistive device: Walker-rolling    Walk 150 feet activity   Assist Walk 150 feet activity did not occur: Safety/medical concerns  Assist level: Contact Guard/Touching assist Assistive device: Walker-rolling    Walk 10 feet on uneven surface  activity   Assist     Assist level: Moderate Assistance - Patient - 50 - 74% Assistive device: Other (comment) (Hand rail)   Wheelchair     Assist Is the patient using a wheelchair?: Yes Type of Wheelchair: Manual    Wheelchair assist level: Dependent - Patient 0% (Only used as transport chair) Max wheelchair distance: 150'    Wheelchair 50 feet with 2 turns activity    Assist        Assist Level: Dependent - Patient 0%   Wheelchair 150 feet activity     Assist      Assist Level: Dependent - Patient 0%   Blood pressure 132/86, pulse 69, temperature 97.7 F (36.5 C), temperature source Oral, resp. rate 18, height 5\' 5"  (1.651 m), weight 41.8 kg, SpO2 100 %.  Medical Problem List and Plan: 1. 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  Antithrombotics: -DVT/anticoagulation:  Pharmaceutical: Lovenox             -antiplatelet therapy: N/a 3. Pain Management: hydrocodone prn.               --will add gabapentin for neuropathic symptoms.    3/15- says mainly taking tylenol- since hx of addiction- trying to avoid.   3/18- sore this AM, but tolerable  3/19- sore/tired this Am- therapy pushed him yesterday- con't regimen  3/20- collar is his main issue, but tolerating pain/soreness  3/21- says pain almost  gone- 1/10 this AM- did get volateren x1- will make sure QID scheduled 4. Mood/Behavior/Sleep: LCSW to follow for  evaluation and support.              -antipsychotic agents: N/A             --Discussed compliance with collar. Melatonin added for insomnia.Don't want to do Trazodone because concern for urinary retention   3/19- decreased melatonin to 3 mg QHS 5. Neuropsych/cognition: This patient is capable of making decisions on his own behalf. 6. Skin/Wound Care: Routine pressure relief measures.  7. Fluids/Electrolytes/Nutrition: Monitor I/O. Check CMET in am 8. ABLA: Recheck CBC in am  3/15- Hb stable at 10.6  3/18- Hb 11.5 9. Electrolyte abnormalities: Recheck BMET in am to follow up on Sodium and potassium levels. 10. Low calorie severe malnutrition: BMI 15Continue ensure bid.  3/18- Albumin 3.3 but BMI 15- needs to gain some weight- doing supplements  3/22- encouraging pt to finish his breakfast 11. Dysphagia: Discussed edema/surgery leading to aspiration risk/PNA --patient does not want diet modified. Went over risks of pneumonia, and spesis, etc- pt emphatic doesn't want nectar thick liquids 3/22- appears to be resolved 12. Neurogenic bladder/Frequency: Will check voiding function with bladder scan for PVRs. - sounds like is automatically double voiding- will start Flomax 0.4 mg qsupper  3/15- PVR's 0-6cc- so doing well  3/18- PVRs low- voiding well  3/19- voiding well- no PVRS/Bladder scans anymore- will d/c  3/20- still having frequency- will stop Flomax- see if that helps frequency, before starting Oxybutynin  3/21- think it's better- slept better with Flomax off.   13. Bowel urgency with constipation: Will reduce colace to once daily and monitor. But also feels constipated- will see if needs to be cleaned out?  3/15- will give sorbitol- since pt feels constipated- 45 cc- doesn't want enema or Suppository- did have large BM             3/16 - Small bowel movement overnight, refused sorbitol, increase Colace to twice daily and MiraLAX to daily,  3/17 -Medium bowel movement this a.m., feels much improved  3/19- added Senna 2 tabs qday and will try Sorbitol again after therapy- LBM 2 days ago and was small. Addendum- just had large BM- stopped Sorbitol order  3/21- LBM yesterday  3/22- Will stop Colace 100 mg BID- con't Senna 2 tabs daily, but might need to stop- not taking pain meds 14. Insomnia  3/18- "out of it" this AM- will decrease melatonin to 3 mg QHS    3/19- more "with it" this AM, but still sleepy- not a morning person per pt.  15. Hyponatremia  3/19- Na down to 129- not sure why- but first time below 132- will recheck in AM and treat as appropriate.   3/20- Na 130- back up slightly- will con't to monitor  3/21- will check Monday   Pt has agreed that he needs to stay til 3/26- not happy, but willing.    I spent a total of  35  minutes on total care today- >50% coordination of care- due to  D/w nursing about bowels and pain- also with Spine surgeon   LOS: 8 days A FACE TO FACE EVALUATION WAS PERFORMED  Kimi Bordeau 12/30/2022, 8:14 AM

## 2022-12-31 DIAGNOSIS — M542 Cervicalgia: Secondary | ICD-10-CM

## 2022-12-31 DIAGNOSIS — N319 Neuromuscular dysfunction of bladder, unspecified: Secondary | ICD-10-CM

## 2022-12-31 DIAGNOSIS — K5900 Constipation, unspecified: Secondary | ICD-10-CM

## 2022-12-31 DIAGNOSIS — E440 Moderate protein-calorie malnutrition: Secondary | ICD-10-CM

## 2022-12-31 NOTE — Progress Notes (Signed)
PROGRESS NOTE   Subjective/Complaints:  C collar very uncomfortable despite padding. No additional concerns today.  LBM today  ROS:   Pt denies SOB, abd pain, CP, N/V/C/D, cough,  and vision changes  Except for HPI   Objective:   No results found. No results for input(s): "WBC", "HGB", "HCT", "PLT" in the last 72 hours.  No results for input(s): "NA", "K", "CL", "CO2", "GLUCOSE", "BUN", "CREATININE", "CALCIUM" in the last 72 hours.   Intake/Output Summary (Last 24 hours) at 12/31/2022 1344 Last data filed at 12/31/2022 0810 Gross per 24 hour  Intake 577 ml  Output --  Net 577 ml         Physical Exam: Vital Signs Blood pressure (!) 145/74, pulse 75, temperature 97.7 F (36.5 C), resp. rate 17, height 5\' 5"  (1.651 m), weight 41.8 kg, SpO2 100 %.       General: awake, alert, appropriate, sitting up trying to eat breakfast in bedside chair; NAD HENT: conjugate gaze; oropharynx moist- wearing cervical collar- steristrips off- appears to be very loose fitting  CV: regular rate; no JVD Pulmonary: CTA B/L; no W/R/R- good air movement GI: soft, NT, ND, (+)BS- normoactive Psychiatric: appropriate, pleasant Neurological: Ox3 Grip 4/5 B/L and FA 4-/5 on L and 4/5 on R- is really improving  Musculoskeletal:     Comments: UE strength- biceps 4+/5; WE 4+ on R and 4/5 on L; triceps 4/5; Grip 3+/5; and FA 3+/5 B/L LE strength- HF 4/5; KE 4+/5; DF 4/5; and PF 4+/5 B/L   Skin:    General: Skin is warm and dry.     Comments: Cervical incision per neck -appears well-approximated Bruising and scabs between legs from prior use of brief  Neurological:     Mental Status: He is alert and oriented to person, place, and time.     Comments: Soft voice with moderate dysarthria and slurring at times. He was able to follow simple commands without difficulty. BUE weakness with sensory deficits. BLE with significant ataxia.  C6  sensory level decreased to light touch down to T2- torso is intact per pt- but L1-S4 is decreased to light touch   Assessment/Plan: 1. Functional deficits which require 3+ hours per day of interdisciplinary therapy in a comprehensive inpatient rehab setting. Physiatrist is providing close team supervision and 24 hour management of active medical problems listed below. Physiatrist and rehab team continue to assess barriers to discharge/monitor patient progress toward functional and medical goals  Care Tool:  Bathing    Body parts bathed by patient: Right arm, Left arm, Chest, Abdomen, Front perineal area, Buttocks, Right upper leg, Left upper leg, Right lower leg, Left lower leg, Face         Bathing assist Assist Level: Supervision/Verbal cueing     Upper Body Dressing/Undressing Upper body dressing   What is the patient wearing?: Pull over shirt, Orthosis Orthosis activity level: Performed by helper  Upper body assist Assist Level: Minimal Assistance - Patient > 75%    Lower Body Dressing/Undressing Lower body dressing      What is the patient wearing?: Pants, Underwear/pull up     Lower body assist Assist for lower body dressing:  Supervision/Verbal cueing     Toileting Toileting    Toileting assist Assist for toileting: Supervision/Verbal cueing     Transfers Chair/bed transfer  Transfers assist     Chair/bed transfer assist level: Contact Guard/Touching assist     Locomotion Ambulation   Ambulation assist      Assist level: Contact Guard/Touching assist Assistive device: Walker-rolling Max distance: 200   Walk 10 feet activity   Assist     Assist level: Contact Guard/Touching assist Assistive device: Walker-rolling   Walk 50 feet activity   Assist    Assist level: Contact Guard/Touching assist Assistive device: Walker-rolling    Walk 150 feet activity   Assist Walk 150 feet activity did not occur: Safety/medical concerns  Assist  level: Contact Guard/Touching assist Assistive device: Walker-rolling    Walk 10 feet on uneven surface  activity   Assist     Assist level: Moderate Assistance - Patient - 50 - 74% Assistive device: Other (comment) (Hand rail)   Wheelchair     Assist Is the patient using a wheelchair?: Yes Type of Wheelchair: Manual    Wheelchair assist level: Dependent - Patient 0% (Only used as transport chair) Max wheelchair distance: 150'    Wheelchair 50 feet with 2 turns activity    Assist        Assist Level: Dependent - Patient 0%   Wheelchair 150 feet activity     Assist      Assist Level: Dependent - Patient 0%   Blood pressure (!) 145/74, pulse 75, temperature 97.7 F (36.5 C), resp. rate 17, height 5\' 5"  (1.651 m), weight 41.8 kg, SpO2 100 %.  Medical Problem List and Plan: 1. 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  Antithrombotics: -DVT/anticoagulation:  Pharmaceutical: Lovenox             -antiplatelet therapy: N/a 3. Pain Management: hydrocodone prn.              --will add gabapentin for neuropathic symptoms.    3/15- says mainly taking tylenol- since hx of addiction- trying to avoid.   3/18- sore this AM, but tolerable  3/19- sore/tired this Am- therapy pushed him yesterday- con't regimen  3/20- collar is his main issue, but tolerating pain/soreness  3/21- says pain almost  gone- 1/10 this AM- did get volateren x1- will make sure QID scheduled  3/23 Will try smaller C collar-ordered 4. Mood/Behavior/Sleep: LCSW to follow for evaluation and support.              -antipsychotic agents: N/A             --Discussed compliance with collar. Melatonin added for insomnia.Don't want  to do Trazodone because concern for urinary retention   3/19- decreased melatonin to 3 mg QHS 5. Neuropsych/cognition: This patient is capable of making decisions on his own behalf. 6. Skin/Wound Care: Routine pressure relief measures.  7. Fluids/Electrolytes/Nutrition: Monitor I/O. Check CMET in am 8. ABLA: Recheck CBC in am  3/15- Hb stable at 10.6  3/18- Hb 11.5 9. Electrolyte abnormalities: Recheck BMET in  am to follow up on Sodium and potassium levels. 10. Low calorie severe malnutrition: BMI 15Continue ensure bid.  3/18- Albumin 3.3 but BMI 15- needs to gain some weight- doing supplements  3/22- encouraging pt to finish his breakfast  3/23 eating 100% meals, continue to encourage PO intake 11. Dysphagia: Discussed edema/surgery leading to aspiration risk/PNA --patient does not want diet modified. Went over risks of pneumonia, and spesis, etc- pt emphatic doesn't want nectar thick liquids 3/22- appears to be resolved 12. Neurogenic bladder/Frequency: Will check voiding function with bladder scan for PVRs. - sounds like is automatically double voiding- will start Flomax 0.4 mg qsupper  3/15- PVR's 0-6cc- so doing well  3/18- PVRs low- voiding well  3/19- voiding well- no PVRS/Bladder scans anymore- will d/c  3/20- still having frequency- will stop Flomax- see if that helps frequency, before starting Oxybutynin  3/21- think it's better- slept better with Flomax off.   3/23 improved, has been continent of bladder 13. Bowel urgency with constipation: Will reduce colace to once daily and monitor. But also feels constipated- will see if needs to be cleaned out?  3/15- will give sorbitol- since pt feels constipated- 45 cc- doesn't want enema or Suppository- did have large BM             3/16 - Small bowel movement overnight, refused sorbitol, increase Colace to twice daily and MiraLAX to daily,  3/17 -Medium bowel movement this a.m., feels much improved  3/19- added Senna 2 tabs qday and  will try Sorbitol again after therapy- LBM 2 days ago and was small. Addendum- just had large BM- stopped Sorbitol order  3/21- LBM yesterday  3/22- Will stop Colace 100 mg BID- con't Senna 2 tabs daily, but might need to stop- not taking pain meds  3/23 LBM today, improved  14. Insomnia  3/18- "out of it" this AM- will decrease melatonin to 3 mg QHS    3/19- more "with it" this AM, but still sleepy- not a morning person per pt.  15. Hyponatremia  3/19- Na down to 129- not sure why- but first time below 132- will recheck in AM and treat as appropriate.   3/20- Na 130- back up slightly- will con't to monitor  3/21- will check Monday   LOS: 9 days A FACE TO FACE EVALUATION WAS PERFORMED  Jennye Boroughs 12/31/2022, 1:44 PM

## 2022-12-31 NOTE — Progress Notes (Addendum)
New C Collar ordered from Ortho tech for patients comfort. New Collar Adult size medium. Patient states it feels and fits better. Patient requested PRN pain medications throughout the day. States pain is a 8/10 to his back and neck PRN Norco 5-325 mg. Upon recheck patients states pain was 2/10 and feeling better. Appetite good. Encouraged fluids.

## 2022-12-31 NOTE — Progress Notes (Signed)
Orthopedic Tech Progress Note Patient Details:  Samuel [name] [birth year] [id number] Adolescent sized aspen cervical collar has been ordered from [facility]  Patient ID: Samuel [name], male   DOB: 1961/04/08, 62 y.o.   MRN: [id number]  Jearld Lesch 12/31/2022, 9:17 AM

## 2022-12-31 NOTE — Progress Notes (Signed)
Physical Therapy Session Note  Patient Details  Name: Samuel [name] MRN: [id number] Date of Birth: [birth year]  Today's Date: 12/31/2022 PT Individual Time: 1045-1130 PT Individual Time Calculation (min): 45 min   Short Term Goals: Week 1:  PT Short Term Goal 1 (Week 1): STGs = LTGs  Skilled Therapeutic Interventions/Progress Updates:  Pt was seen bedside in the am. Pt transferred to edge of bed with side rail and S. Pt able to put on shoes at edge of bed with S. Pt performed multiple sit to stand and stand pivot transfers with rolling walker and close supervision to contact guard with verbal cues. Pt ambulated 200 feet x 2 with rolling walker and c/g. In gym pt performed cone taps and alternating cone taps, 3 sets x 10 reps each for LE strengthening. Pt returned to room following treatment. Pt performed toilet transfers with close supervision to contact guard. Pt washed hands with S. Pt transferred edge of bed to supine with S and side rail. Pt left sitting up in bed with call bell within reach and bed alarm on.   Therapy Documentation Precautions:  Precautions Precautions: Fall, Cervical Precaution Booklet Issued: Yes (comment) Precaution Comments: reviewed precautions. Required Braces or Orthoses: Cervical Brace Cervical Brace: Hard collar Restrictions Weight Bearing Restrictions: No Other Position/Activity Restrictions: Continue to reposition brace as pt likes to wear it loose. General:   Pain: No c/o pain.     Therapy/Group: Individual Therapy  Dub Amis 12/31/2022, 11:33 AM

## 2023-01-01 DIAGNOSIS — D62 Acute posthemorrhagic anemia: Secondary | ICD-10-CM

## 2023-01-01 NOTE — Progress Notes (Signed)
PROGRESS NOTE   Subjective/Complaints:  New C-collar more comfortable. He asks about results of xray C spine last night- no acute changes.   Pain controlled with medications. No additional concerns today.   ROS:   Pt denies SOB, abd pain, CP, N/V/C/D, cough,  and vision changes + back and neck pain  Except for HPI   Objective:   No results found. No results for input(s): "WBC", "HGB", "HCT", "PLT" in the last 72 hours.  No results for input(s): "NA", "K", "CL", "CO2", "GLUCOSE", "BUN", "CREATININE", "CALCIUM" in the last 72 hours.   Intake/Output Summary (Last 24 hours) at 01/01/2023 1337 Last data filed at 01/01/2023 1317 Gross per 24 hour  Intake 816 ml  Output --  Net 816 ml         Physical Exam: Vital Signs Blood pressure 138/85, pulse 75, temperature (!) 97.5 F (36.4 C), resp. rate 20, height 5\' 5"  (1.651 m), weight 41.8 kg, SpO2 99 %.       General: awake, alert, appropriate, sitting in bed NAD HENT: conjugate gaze; oropharynx moist- wearing cervical collar- fitting better CV: regular rate; no JVD Pulmonary: CTA B/L; no W/R/R- good air movement GI: soft, NT, ND, (+)BS- normoactive Psychiatric: appropriate, very pleasant Neurological: Ox3 Grip 4/5 B/L and FA 4-/5 on L and 4/5 on R- is really improving  Musculoskeletal:     Comments: UE strength- biceps 4+/5; WE 4+ on R and 4/5 on L; triceps 4/5; Grip 3+/5; and FA 3+/5 B/L LE strength- HF 4/5; KE 4+/5; DF 4/5; and PF 4+/5 B/L   Skin:    General: Skin is warm and dry.     Comments: Cervical incision per neck -appears well-approximated Bruising and scabs between legs from prior use of brief  Neurological:     Mental Status: He is alert and oriented to person, place, and time.     Comments: Soft voice with moderate dysarthria and slurring at times. He was able to follow simple commands without difficulty. BUE weakness with sensory deficits. BLE  with significant ataxia.  C6 sensory level decreased to light touch down to T2- torso is intact per pt- but L1-S4 is decreased to light touch   Assessment/Plan: 1. Functional deficits which require 3+ hours per day of interdisciplinary therapy in a comprehensive inpatient rehab setting. Physiatrist is providing close team supervision and 24 hour management of active medical problems listed below. Physiatrist and rehab team continue to assess barriers to discharge/monitor patient progress toward functional and medical goals  Care Tool:  Bathing    Body parts bathed by patient: Right arm, Left arm, Chest, Abdomen, Front perineal area, Buttocks, Right upper leg, Left upper leg, Right lower leg, Left lower leg, Face         Bathing assist Assist Level: Supervision/Verbal cueing     Upper Body Dressing/Undressing Upper body dressing   What is the patient wearing?: Pull over shirt, Orthosis Orthosis activity level: Performed by helper  Upper body assist Assist Level: Minimal Assistance - Patient > 75%    Lower Body Dressing/Undressing Lower body dressing      What is the patient wearing?: Pants, Underwear/pull up  Lower body assist Assist for lower body dressing: Supervision/Verbal cueing     Toileting Toileting    Toileting assist Assist for toileting: Supervision/Verbal cueing     Transfers Chair/bed transfer  Transfers assist     Chair/bed transfer assist level: Contact Guard/Touching assist     Locomotion Ambulation   Ambulation assist      Assist level: Contact Guard/Touching assist Assistive device: Walker-rolling Max distance: 200   Walk 10 feet activity   Assist     Assist level: Contact Guard/Touching assist Assistive device: Walker-rolling   Walk 50 feet activity   Assist    Assist level: Contact Guard/Touching assist Assistive device: Walker-rolling    Walk 150 feet activity   Assist Walk 150 feet activity did not occur:  Safety/medical concerns  Assist level: Contact Guard/Touching assist Assistive device: Walker-rolling    Walk 10 feet on uneven surface  activity   Assist     Assist level: Moderate Assistance - Patient - 50 - 74% Assistive device: Other (comment) (Hand rail)   Wheelchair     Assist Is the patient using a wheelchair?: Yes Type of Wheelchair: Manual    Wheelchair assist level: Dependent - Patient 0% (Only used as transport chair) Max wheelchair distance: 150'    Wheelchair 50 feet with 2 turns activity    Assist        Assist Level: Dependent - Patient 0%   Wheelchair 150 feet activity     Assist      Assist Level: Dependent - Patient 0%   Blood pressure 138/85, pulse 75, temperature (!) 97.5 F (36.4 C), resp. rate 20, height 5\' 5"  (1.651 m), weight 41.8 kg, SpO2 99 %.  Medical Problem List and Plan: 1. 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  Antithrombotics: -DVT/anticoagulation:  Pharmaceutical: Lovenox             -antiplatelet therapy: N/a 3. Pain Management: hydrocodone prn.              --will add gabapentin for neuropathic symptoms.    3/15- says mainly taking tylenol- since hx of addiction- trying to avoid.   3/18- sore this AM, but tolerable  3/19- sore/tired this Am- therapy pushed him yesterday- con't regimen  3/20- collar is his main issue, but tolerating pain/soreness  3/21- says pain almost  gone- 1/10 this AM- did get volateren x1- will make sure QID scheduled  3/23 Will try smaller C collar-ordered  3/24 appears to have improved comfort new new C-collar  4. Mood/Behavior/Sleep: LCSW to follow for evaluation and support.              -antipsychotic agents:  N/A             --Discussed compliance with collar. Melatonin added for insomnia.Don't want to do Trazodone because concern for urinary retention   3/19- decreased melatonin to 3 mg QHS 5. Neuropsych/cognition: This patient is capable of making decisions on his own behalf. 6. Skin/Wound Care: Routine pressure relief measures.  7. Fluids/Electrolytes/Nutrition: Monitor I/O. Check CMET in am 8. ABLA: Recheck  CBC in am  3/15- Hb stable at 10.6  3/18- Hb 11.5  Recheck tomorrow 9. Electrolyte abnormalities: Recheck BMET in am to follow up on Sodium and potassium levels. 10. Low calorie severe malnutrition: BMI 15Continue ensure bid.  3/18- Albumin 3.3 but BMI 15- needs to gain some weight- doing supplements  3/22- encouraging pt to finish his breakfast  3/24 eating most of his meals, continue to monitor 11. Dysphagia: Discussed edema/surgery leading to aspiration risk/PNA --patient does not want diet modified. Went over risks of pneumonia, and spesis, etc- pt emphatic doesn't want nectar thick liquids 3/22- appears to be resolved 12. Neurogenic bladder/Frequency: Will check voiding function with bladder scan for PVRs. - sounds like is automatically double voiding- will start Flomax 0.4 mg qsupper  3/15- PVR's 0-6cc- so doing well  3/18- PVRs low- voiding well  3/19- voiding well- no PVRS/Bladder scans anymore- will d/c  3/20- still having frequency- will stop Flomax- see if that helps frequency, before starting Oxybutynin  3/21- think it's better- slept better with Flomax off.   3/23 improved, has been continent of bladder  3/24 reports this has improved, continue to monitor 13. Bowel urgency with constipation: Will reduce colace to once daily and monitor. But also feels constipated- will see if needs to be cleaned out?  3/15- will give sorbitol- since pt feels constipated- 45 cc- doesn't want enema or Suppository- did have large BM             3/16 - Small bowel movement overnight, refused  sorbitol, increase Colace to twice daily and MiraLAX to daily,  3/17 -Medium bowel movement this a.m., feels much improved  3/19- added Senna 2 tabs qday and will try Sorbitol again after therapy- LBM 2 days ago and was small. Addendum- just had large BM- stopped Sorbitol order  3/21- LBM yesterday  3/22- Will stop Colace 100 mg BID- con't Senna 2 tabs daily, but might need to stop- not taking pain meds  3/24  Reports regular Bms, no concerns 14. Insomnia  3/18- "out of it" this AM- will decrease melatonin to 3 mg QHS    3/19- more "with it" this AM, but still sleepy- not a morning person per pt.  15. Hyponatremia  3/19- Na down to 129- not sure why- but first time below 132- will recheck in AM and treat as appropriate.   3/20- Na 130- back up slightly- will con't to monitor  3/21- will check Monday   LOS: 10 days A FACE TO FACE EVALUATION WAS PERFORMED  Jennye Boroughs 01/01/2023, 1:37 PM

## 2023-01-01 NOTE — Progress Notes (Signed)
Occupational Therapy Session Note  Patient Details  Name: Samuel [name] MRN: [id number] Date of Birth: 02/10/1961  Today's Date: 01/01/2023 OT Individual Time: [id number] OT Individual Time Calculation (min): 45 min    Short Term Goals: Week 1:  OT Short Term Goal 1 (Week 1): STG=LTG d/t ELOS  Skilled Therapeutic Interventions/Progress Updates:   Patient in bed upon arrival, patient indicated that he rested well last night with a pain response in bilateral shld of 3 on 0-10 scale. The pt indicated that he reported his pain level to nursing and she indicated that she would provide meds after therapy. The pt was able to transfer from supine to EOB with SBA. The pt was able to transfer from the EOB to the w/c using a stand pivot transfer with CGA.  The pt was able to wheel himself over to the sink area and remove his over head shirt with SBA.  The pt went on to wash his face, chest, BUE, and bilateral upper leg area with s/u assist. The pt was able to come from sit to stand by grasping the sink area for addiotnal balance to wash his perineal and bottom with close S.  The pt was able to apply deodorant and donn his over head shirt with s/u assist. The pt was able to donn his brief and pants with MinA.  The pt was MaxA with his non skid socks.  The pt  was able to transfer back into bed from the w/c using a stand pivot transfer incorporating the bed rail with MinA for managing BLE. The call light and bedside table were placed within reach and all additional needs were addressed prior to exiting the room.   Therapy Documentation Precautions:  Precautions Precautions: Fall, Cervical Precaution Booklet Issued: Yes (comment) Precaution Comments: reviewed precautions. Required Braces or Orthoses: Cervical Brace Cervical Brace: Hard collar Restrictions Weight Bearing Restrictions: No Other Position/Activity Restrictions: Continue to reposition brace as pt likes to wear it loose.  Therapy/Group:  Individual Therapy  Yvonne Kendall 01/01/2023, 12:49 PM

## 2023-01-02 ENCOUNTER — Other Ambulatory Visit (HOSPITAL_COMMUNITY): Payer: Self-pay

## 2023-01-02 DIAGNOSIS — D62 Acute posthemorrhagic anemia: Secondary | ICD-10-CM | POA: Insufficient documentation

## 2023-01-02 LAB — CBC
HCT: 34.8 % — ABNORMAL LOW (ref 39.0–52.0)
Hemoglobin: 11.6 g/dL — ABNORMAL LOW (ref 13.0–17.0)
MCH: 30 pg (ref 26.0–34.0)
MCHC: 33.3 g/dL (ref 30.0–36.0)
MCV: 89.9 fL (ref 80.0–100.0)
Platelets: 347 10*3/uL (ref 150–400)
RBC: 3.87 MIL/uL — ABNORMAL LOW (ref 4.22–5.81)
RDW: 14.3 % (ref 11.5–15.5)
WBC: 5.3 10*3/uL (ref 4.0–10.5)
nRBC: 0 % (ref 0.0–0.2)

## 2023-01-02 LAB — BASIC METABOLIC PANEL
Anion gap: 10 (ref 5–15)
BUN: 9 mg/dL (ref 8–23)
CO2: 21 mmol/L — ABNORMAL LOW (ref 22–32)
Calcium: 9.3 mg/dL (ref 8.9–10.3)
Chloride: 101 mmol/L (ref 98–111)
Creatinine, Ser: 0.57 mg/dL — ABNORMAL LOW (ref 0.61–1.24)
GFR, Estimated: >60 mL/min (ref >60)
Glucose, Bld: 100 mg/dL — ABNORMAL HIGH (ref 70–99)
Potassium: 4 mmol/L (ref 3.5–5.1)
Sodium: 132 mmol/L — ABNORMAL LOW (ref 135–145)

## 2023-01-02 MED ORDER — TRAMADOL HCL 50 MG PO TABS
50.0000 mg | ORAL_TABLET | Freq: Four times a day (QID) | ORAL | 0 refills | Status: DC | PRN
  Filled 2023-01-02: qty 20, 5d supply, fill #0

## 2023-01-02 MED ORDER — METHOCARBAMOL 500 MG PO TABS
500.0000 mg | ORAL_TABLET | Freq: Four times a day (QID) | ORAL | 0 refills | Status: DC | PRN
  Filled 2023-01-02: qty 60, 14d supply, fill #0

## 2023-01-02 MED ORDER — SENNA 8.6 MG PO TABS
2.0000 | ORAL_TABLET | Freq: Every day | ORAL | 0 refills | Status: DC
  Filled 2023-01-02: qty 60, 30d supply, fill #0

## 2023-01-02 MED ORDER — MELATONIN 3 MG PO TABS
3.0000 mg | ORAL_TABLET | Freq: Every day | ORAL | 0 refills | Status: DC
  Filled 2023-01-02: qty 30, 30d supply, fill #0

## 2023-01-02 MED ORDER — DICLOFENAC SODIUM 1 % EX GEL
2.0000 g | Freq: Four times a day (QID) | CUTANEOUS | 0 refills | Status: DC
  Filled 2023-01-02: qty 200, 25d supply, fill #0

## 2023-01-02 NOTE — Progress Notes (Signed)
Inpatient Rehabilitation Discharge Medication Review by a Pharmacist  A complete drug regimen review was completed for this patient to identify any potential clinically significant medication issues.  High Risk Drug Classes Is patient taking? Indication by Medication  Antipsychotic No   Anticoagulant No   Antibiotic No   Opioid Yes Tramadol - prn pain   Antiplatelet No   Hypoglycemics/insulin No   Vasoactive Medication No   Chemotherapy No   Other Yes Methocarbamol - prn spasms     Type of Medication Issue Identified Description of Issue Recommendation(s)  Drug Interaction(s) (clinically significant)     Duplicate Therapy     Allergy     No Medication Administration End Date     Incorrect Dose     Additional Drug Therapy Needed     Significant med changes from prior encounter (inform family/care partners about these prior to discharge).    Other       Clinically significant medication issues were identified that warrant physician communication and completion of prescribed/recommended actions by midnight of the next day:  No  Name of provider notified for urgent issues identified:   Provider Method of Notification:     Pharmacist comments: None  Time spent performing this drug regimen review (minutes): 20 minutes  Thank you Anette Guarneri, PharmD

## 2023-01-02 NOTE — Progress Notes (Signed)
Patient ID: Samuel [name], male   DOB: 06/15/1961, 62 y.o.   MRN: [id number]  SW ordered RW with [employer] via parachute.   Loralee Pacas, MSW, [place] Office: [phone] Cell: [phone] Fax: [phone]

## 2023-01-02 NOTE — Progress Notes (Signed)
Occupational Therapy Session Note  Patient Details  Name: Samuel [name] MRN: [id number] Date of Birth: 1960-12-13  Today's Date: 01/02/2023 OT Individual Time: 0700-0810 OT Individual Time Calculation (min): 70 min    Short Term Goals: Week 2:  OT Short Term Goal 1 (Week 2): STG=LTG d/t ELOS  Skilled Therapeutic Interventions/Progress Updates:    Pt resting in bed upon arrival. OT intervention with focus on BADLs, functional amb with RW, safety awareness, and ongoing discharge planning in preparation for discharge home tomorrow. Pt completed bathing/dressing (see below) and mod I/supervision level with exception of doffing/donning cervical collar. All amb with RW in room with mod I. Pt stood at sink to complete grooming tasks. Pt mod I for self feeding using adaptive utensils. Pt remained seated in recliner with belt alarm activaed. All needs within reach.   Therapy Documentation Precautions:  Precautions Precautions: Fall, Cervical Precaution Booklet Issued: Yes (comment) Precaution Comments: reviewed precautions. Required Braces or Orthoses: Cervical Brace Cervical Brace: Hard collar Restrictions Weight Bearing Restrictions: No Other Position/Activity Restrictions: Continue to reposition brace as pt likes to wear it loose.  Pain:  Pt reports 2/10 posterior neck pain; warm shower and RN aware ADL: ADL Eating: Modified independent Where Assessed-Eating: Chair Grooming: Modified independent Where Assessed-Grooming: Standing at sink Upper Body Bathing: Modified independent Where Assessed-Upper Body Bathing: Shower Lower Body Bathing: Modified independent Where Assessed-Lower Body Bathing: Shower Upper Body Dressing: Minimal assistance Where Assessed-Upper Body Dressing: Edge of bed Lower Body Dressing: Modified independent Where Assessed-Lower Body Dressing: Edge of bed Toileting: Modified independent Where Assessed-Toileting: Glass blower/designer: Midwife Method: Clinical biochemist Method: Heritage manager: Civil engineer, contracting with back Vision Baseline Vision/History: 1 Wears glasses Patient Visual Report: No change from baseline Vision Assessment?: No apparent visual deficits Perception  Perception: Within Functional Limits Praxis Praxis: Probation officer Sitting - Balance Support: Feet supported Static Sitting - Level of Assistance: 6: Modified independent (Device/Increase time) Dynamic Sitting Balance Dynamic Sitting - Balance Support: During functional activity Dynamic Sitting - Level of Assistance: 6: Modified independent (Device/Increase time) Exercises:   Other Treatments:     Therapy/Group: Individual Therapy  Apolonio, Whiton 01/02/2023, 8:05 AM

## 2023-01-02 NOTE — Progress Notes (Signed)
PROGRESS NOTE   Subjective/Complaints:  Doing better with new collar BowelsOK- LBM this AM Ready for d/c- discussed d/c plans.    ROS:   Pt denies SOB, abd pain, CP, N/V/C/D, and vision changes   Except for HPI   Objective:   No results found. Recent Labs    01/02/23 0655  WBC 5.3  HGB 11.6*  HCT 34.8*  PLT 347    Recent Labs    01/02/23 0655  NA 132*  K 4.0  CL 101  CO2 21*  GLUCOSE 100*  BUN 9  CREATININE 0.57*  CALCIUM 9.3    Intake/Output Summary (Last 24 hours) at 01/02/2023 0845 Last data filed at 01/02/2023 0815 Gross per 24 hour  Intake 720 ml  Output 425 ml  Net 295 ml        Physical Exam: Vital Signs Blood pressure 130/72, pulse 78, temperature 97.6 F (36.4 C), temperature source Oral, resp. rate 16, height 5\' 5"  (1.651 m), weight 41.8 kg, SpO2 100 %.        General: awake, alert, appropriate,sitting EOB getting dressed with OTA;  NAD HENT: conjugate gaze; oropharynx moist CV: regular rate; no JVD Pulmonary: CTA B/L; no W/R/R- good air movement GI: soft, NT, ND, (+)BS- flat- almost cachetic  Psychiatric: appropriate Neurological: Ox3  Grip 4/5 B/L and FA 4-/5 on L and 4/5 on R- is really improving  Musculoskeletal:     Comments: UE strength- biceps 4+/5; WE 4+ on R and 4/5 on L; triceps 4/5; Grip 3+/5; and FA 3+/5 B/L LE strength- HF 4/5; KE 4+/5; DF 4/5; and PF 4+/5 B/L   Skin:    General: Skin is warm and dry.     Comments: Cervical incision per neck -appears well-approximated Bruising and scabs between legs from prior use of brief  Neurological:     Mental Status: He is alert and oriented to person, place, and time.     Comments: Soft voice with moderate dysarthria and slurring at times. He was able to follow simple commands without difficulty. BUE weakness with sensory deficits. BLE with significant ataxia.  C6 sensory level decreased to light touch down to T2-  torso is intact per pt- but L1-S4 is decreased to light touch   Assessment/Plan: 1. Functional deficits which require 3+ hours per day of interdisciplinary therapy in a comprehensive inpatient rehab setting. Physiatrist is providing close team supervision and 24 hour management of active medical problems listed below. Physiatrist and rehab team continue to assess barriers to discharge/monitor patient progress toward functional and medical goals  Care Tool:  Bathing    Body parts bathed by patient: Right arm, Left arm, Chest, Abdomen, Front perineal area, Buttocks, Right upper leg, Left upper leg, Right lower leg, Left lower leg, Face         Bathing assist Assist Level: Independent with assistive device     Upper Body Dressing/Undressing Upper body dressing   What is the patient wearing?: Pull over shirt, Orthosis Orthosis activity level: Performed by helper  Upper body assist Assist Level: Minimal Assistance - Patient > 75%    Lower Body Dressing/Undressing Lower body dressing      What  is the patient wearing?: Pants, Underwear/pull up     Lower body assist Assist for lower body dressing: Independent with assitive device     Toileting Toileting    Toileting assist Assist for toileting: Independent with assistive device     Transfers Chair/bed transfer  Transfers assist     Chair/bed transfer assist level: Contact Guard/Touching assist     Locomotion Ambulation   Ambulation assist      Assist level: Contact Guard/Touching assist Assistive device: Walker-rolling Max distance: 200   Walk 10 feet activity   Assist     Assist level: Contact Guard/Touching assist Assistive device: Walker-rolling   Walk 50 feet activity   Assist    Assist level: Contact Guard/Touching assist Assistive device: Walker-rolling    Walk 150 feet activity   Assist Walk 150 feet activity did not occur: Safety/medical concerns  Assist level: Contact  Guard/Touching assist Assistive device: Walker-rolling    Walk 10 feet on uneven surface  activity   Assist     Assist level: Moderate Assistance - Patient - 50 - 74% Assistive device: Other (comment) (Hand rail)   Wheelchair     Assist Is the patient using a wheelchair?: Yes Type of Wheelchair: Manual    Wheelchair assist level: Dependent - Patient 0% (Only used as transport chair) Max wheelchair distance: 150'    Wheelchair 50 feet with 2 turns activity    Assist        Assist Level: Dependent - Patient 0%   Wheelchair 150 feet activity     Assist      Assist Level: Dependent - Patient 0%   Blood pressure 130/72, pulse 78, temperature 97.6 F (36.4 C), temperature source Oral, resp. rate 16, height 5\' 5"  (1.651 m), weight 41.8 kg, SpO2 100 %.  Medical Problem List and Plan: 1. 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  Antithrombotics: -DVT/anticoagulation:  Pharmaceutical: Lovenox             -antiplatelet therapy: N/a 3. Pain Management: hydrocodone prn.              --will add gabapentin for neuropathic symptoms.    3/15- says mainly taking tylenol- since hx of addiction- trying to avoid.   3/18- sore this AM, but tolerable  3/19- sore/tired this Am- therapy pushed him yesterday- con't regimen  3/20- collar is his main issue, but tolerating pain/soreness  3/21- says pain almost  gone- 1/10 this AM- did get volateren x1- will make sure QID scheduled  3/23 Will try smaller C collar-ordered  3/24 appears to have improved comfort new new C-collar   3/25- doing better 4. Mood/Behavior/Sleep: LCSW to follow for evaluation and support.              -antipsychotic  agents: N/A             --Discussed compliance with collar. Melatonin added for insomnia.Don't want to do Trazodone because concern for urinary retention   3/19- decreased melatonin to 3 mg QHS 5. Neuropsych/cognition: This patient is capable of making decisions  on his own behalf. 6. Skin/Wound Care: Routine pressure relief measures.  7. Fluids/Electrolytes/Nutrition: Monitor I/O. Check CMET in am 8. ABLA: Recheck CBC in am  3/15- Hb stable at 10.6  3/18- Hb 11.5  Recheck tomorrow 9. Electrolyte abnormalities: Recheck BMET in am to follow up on Sodium and potassium levels. 10. Low calorie severe malnutrition: BMI 15Continue ensure bid.  3/18- Albumin 3.3 but BMI 15- needs to gain some weight- doing supplements  3/22- encouraging pt to finish his breakfast  3/24 eating most of his meals, continue to monitor 11. Dysphagia: Discussed edema/surgery leading to aspiration risk/PNA --patient does not want diet modified. Went over risks of pneumonia, and spesis, etc- pt emphatic doesn't want nectar thick liquids 3/22- appears to be resolved 12. Neurogenic bladder/Frequency: Will check voiding function with bladder scan for PVRs. - sounds like is automatically double voiding- will start Flomax 0.4 mg qsupper  3/15- PVR's 0-6cc- so doing well  3/18- PVRs low- voiding well  3/19- voiding well- no PVRS/Bladder scans anymore- will d/c  3/20- still having frequency- will stop Flomax- see if that helps frequency, before starting Oxybutynin  3/21- think it's better- slept better with Flomax off.   3/23 improved, has been continent of bladder  3/24 reports this has improved, continue to monitor 13. Bowel urgency with constipation: Will reduce colace to once daily and monitor. But also feels constipated- will see if needs to be cleaned out?  3/15- will give sorbitol- since pt feels constipated- 45 cc- doesn't want enema or Suppository- did have large BM             3/16 - Small bowel movement overnight,  refused sorbitol, increase Colace to twice daily and MiraLAX to daily,  3/17 -Medium bowel movement this a.m., feels much improved  3/19- added Senna 2 tabs qday and will try Sorbitol again after therapy- LBM 2 days ago and was small. Addendum- just had large BM- stopped Sorbitol order  3/21- LBM yesterday  3/22- Will stop Colace 100 mg BID- con't Senna 2 tabs daily, but might need to stop- not taking pain meds  3/24  Reports regular Bms, no concerns 14. Insomnia  3/18- "out of it" this AM- will decrease melatonin to 3 mg QHS    3/19- more "with it" this AM, but still sleepy- not a morning person per pt.  15. Hyponatremia  3/19- Na down to 129- not sure why- but first time below 132- will recheck in AM and treat as appropriate.   3/20- Na 130- back up slightly- will con't to monitor  3/21- will check Monday  3/25- Na 132- doing better   LOS: 11 days A FACE TO FACE EVALUATION WAS PERFORMED  Manisha Cancel 01/02/2023, 8:45 AM

## 2023-01-02 NOTE — Progress Notes (Signed)
Nutrition Follow-up  DOCUMENTATION CODES:   Non-severe (moderate) malnutrition in context of chronic illness  INTERVENTION:  - Continue Ensure Surgery BID.   - Continue -1 packet Juven BID, each packet provides 95 calories, 2.5 grams of protein (collagen), and 9.8 grams of carbohydrate (3 grams sugar); also contains 7 grams of L-arginine and L-glutamine, 300 mg vitamin C, 15 mg vitamin E, 1.2 mcg vitamin B-12, 9.5 mg zinc, 200 mg calcium, and 1.5 g  Calcium Beta-hydroxy-Beta-methylbutyrate to support wound healing  NUTRITION DIAGNOSIS:   Moderate Malnutrition related to chronic illness as evidenced by moderate fat depletion, moderate muscle depletion. - ongoing  GOAL:   Patient will meet greater than or equal to 90% of their needs - Met   MONITOR:   PO intake  REASON FOR ASSESSMENT:   Malnutrition Screening Tool    ASSESSMENT:   62 y.o. male admits to inpatient rehab related to functional decline due to cervical myelopathy. PMH includes: elevated LFTs, pancreatitis.  Meds reviewed:  miralax, senokot. Labs reviewed: Na low.   Pt ate 100% of his breakfast this am. Per record, the pt has been eating 80-100% of his meals since last assessment. Pt is meeting his needs at this time. RD will continue to monitor PO intakes.   Diet Order:   Diet Order             Diet regular Room service appropriate? Yes; Fluid consistency: Thin  Diet effective now                   EDUCATION NEEDS:   Not appropriate for education at this time  Skin:  Skin Assessment: Reviewed RN Assessment  Last BM:  3/23 - type 6  Height:   Ht Readings from Last 1 Encounters:  12/22/22 5\' 5"  (1.651 m)    Weight:   Wt Readings from Last 1 Encounters:  12/22/22 41.8 kg    Ideal Body Weight:     BMI:  Body mass index is 15.33 kg/m.  Estimated Nutritional Needs:   Kcal:  [id number] kcals  Protein:  60-75 gm  Fluid:  >/= 1.2 L  Thalia Bloodgood, RD, LDN, CNSC.

## 2023-01-02 NOTE — Progress Notes (Signed)
Occupational Therapy Discharge Summary  Patient Details  Name: Samuel [name] MRN: [id number] Date of Birth: Apr 13, 1961  Date of Discharge from [place] 25, 2024   Patient has met 11 of 11 long term goals due to improved activity tolerance, improved balance, postural control, ability to compensate for deficits, functional use of  RIGHT upper, RIGHT lower, LEFT upper, and LEFT lower extremity, and improved coordination. Pt made steady progress with BADLs and functional transfers during this admission.  Pt completes all bathing/dressing and toileting tasks at mod I level. Pt uses his RUE at dominant level with some ataxia, although improved. Pt verbalizes understanding of need to wear cervical collar at all times except when bathing in shower. Pt verbalizes understanding of all home safety recommendations. Pt reports his cousin and/or nephew will be with him at all times. Patient to discharge at overall Modified Independent level.  Patient's care partner is independent to provide the necessary physical assistance at discharge.    Reasons goals not met: n/a  Recommendation:  Patient will benefit from ongoing skilled OT services in outpatient setting to continue to advance functional skills in the area of BADL, iADL, and Reduce care partner burden.  Equipment: No equipment provided  Reasons for discharge: treatment goals met  Patient/family agrees with progress made and goals achieved: Yes  OT Discharge ADL ADL Eating: Modified independent Where Assessed-Eating: Chair Grooming: Modified independent Where Assessed-Grooming: Standing at sink Upper Body Bathing: Modified independent Where Assessed-Upper Body Bathing: Shower Lower Body Bathing: Modified independent Where Assessed-Lower Body Bathing: Shower Upper Body Dressing: Minimal assistance Where Assessed-Upper Body Dressing: Edge of bed Lower Body Dressing: Modified independent Where Assessed-Lower Body Dressing: Edge of  bed Toileting: Modified independent Where Assessed-Toileting: Glass blower/designer: Diplomatic Services operational officer Method: Clinical biochemist Method: Heritage manager: Civil engineer, contracting with back Vision Baseline Vision/History: 1 Wears glasses Patient Visual Report: No change from baseline Vision Assessment?: No apparent visual deficits Perception  Perception: Within Functional Limits Praxis Praxis: Intact Cognition Cognition Overall Cognitive Status: Within Functional Limits for tasks assessed Arousal/Alertness: Awake/alert Orientation Level: Person;Place;Situation Person: Oriented Place: Oriented Situation: Oriented Attention: Sustained Sustained Attention: Appears intact Awareness: Appears intact Problem Solving: Appears intact Safety/Judgment: Appears intact Brief Interview for Mental Status (BIMS) Repetition of Three Words (First Attempt): 3 Temporal Orientation: Year: Correct Temporal Orientation: Month: Accurate within 5 days Temporal Orientation: Day: Correct Recall: "Sock": Yes, no cue required Recall: "Blue": Yes, no cue required Recall: "Bed": Yes, no cue required BIMS Summary Score: 15 Sensation Sensation Light Touch: Impaired by gross assessment Hot/Cold: Appears Intact Proprioception: Impaired by gross assessment;Appears Intact Stereognosis: Impaired by gross assessment Coordination Gross Motor Movements are Fluid and Coordinated: No Fine Motor Movements are Fluid and Coordinated: No Finger Nose Finger Test: decreased speed 2/2 discoordination Motor  Motor Motor: Ataxia;Tetraplegia Motor - Skilled Clinical Observations: gross generalized weakness, L weaker than R Trunk/Postural Assessment  Cervical Assessment Cervical Assessment: Exceptions to [facility] (cervical collar) Thoracic Assessment Thoracic Assessment: Within Functional Limits Lumbar Assessment Lumbar  Assessment: Within Functional Limits Postural Control Postural Control: Within Functional Limits  Balance Static Sitting Balance Static Sitting - Balance Support: Feet supported Static Sitting - Level of Assistance: 6: Modified independent (Device/Increase time) Dynamic Sitting Balance Dynamic Sitting - Balance Support: During functional activity Dynamic Sitting - Level of Assistance: 6: Modified independent (Device/Increase time) Extremity/Trunk Assessment RUE Assessment RUE Assessment: Exceptions to [facility] General Strength Comments: generalized weakenss, full ROM, decreased coordination LUE Assessment  LUE Assessment: Exceptions to [facility] General Strength Comments: generalized weakness more than L, decreased soordination   Jakyree, Ericksen 01/02/2023, 7:44 AM

## 2023-01-02 NOTE — Discharge Summary (Signed)
Physician Discharge Summary  Patient ID: Samuel [name] MRN: [id number] DOB/AGE: 02/02/1961 62 y.o.  Admit date: 12/22/2022 Discharge date: 01/03/2023  Discharge Diagnoses:  Principal Problem:   Cervical myelopathy ([place]) Active Problems:   Hyponatremia   TOBACCO ABUSE   Neurogenic bladder   Malnutrition of moderate degree   Acute blood loss anemia   Discharged Condition: stable  Significant Diagnostic Studies: DG Cervical Spine 2 or 3 views  Result Date: 12/29/2022 CLINICAL DATA:  Postop EXAM: CERVICAL SPINE - 2-3 VIEW COMPARISON:  12/16/2022 FINDINGS: Anterior plate and screws at [id number] with intervening bone plug at the C3-C4 disc space. Scattered endplate spur formation and disc space narrowing in cervical spine. Bones appear demineralized. Mild cervicothoracic scoliosis apex LEFT. No fracture, subluxation, or bone destruction. IMPRESSION: Postoperative changes of anterior fusion C3-C4. Electronically Signed   By: Lavonia Dana M.D.   On: 12/29/2022 11:03   n.    Labs:  Basic Metabolic Panel:    Latest Ref Rng & Units 01/02/2023    6:55 AM 12/28/2022    5:48 AM 12/26/2022    8:25 AM  BMP  Glucose 70 - 99 mg/dL 100  100  217   BUN 8 - 23 mg/dL 9  9  8    Creatinine 0.61 - 1.24 mg/dL 0.57  0.46  0.64   Sodium 135 - 145 mmol/L 132  130  129   Potassium 3.5 - 5.1 mmol/L 4.0  4.1  3.9   Chloride 98 - 111 mmol/L 101  99  96   CO2 22 - 32 mmol/L 21  23  25    Calcium 8.9 - 10.3 mg/dL 9.3  9.3  9.1      CBC:    Latest Ref Rng & Units 01/02/2023    6:55 AM 12/26/2022    8:25 AM 12/23/2022    6:11 AM  CBC  WBC 4.0 - 10.5 K/uL 5.3  3.9  6.3   Hemoglobin 13.0 - 17.0 g/dL 11.6  11.5  10.8   Hematocrit 39.0 - 52.0 % 34.8  34.8  32.1   Platelets 150 - 400 K/uL 347  294  264      CBG: No results for input(s): "GLUCAP" in the last 168 hours.  Brief HPI:   Samuel [name] is a 62 y.o. male with history of chronic pancreatitis, GB polyps, abnormal LFTs, EtOH/cocaine use in the past,  3-year history of numbness/tingling bilateral hands progressing to weakness with decrease in dexterity, difficulty feeding himself and numbness BLE.  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  He was evaluated by Dr. Ileene Rubens and elected to undergo ACDF C3-C4 on 12/16/2022.  Postop noted to have acute blood loss anemia as well as transient hyponatremia and hypokalemia.    He was noted to be coughing with sips of liquids and had difficulty expelling tolerating solids at times therefore MBS done on 03/12 showing observable edema in posterior pharyngeal wall restricting epiglottic inversion with residue and aspiration spillage of solids.  Nectar thick liquid diet recommended however patient declined this and continues on regular diet.  Recommendations also to be cervical collar at all times in 6 weeks but could remove for shower.  Have sensory deficits BUE and BLE as well as buttocks.  Reported frequency as well as issues with insomnia.  Therapy was working with patient was limited but did not meet  due to swallowing was poor safety awareness.  CIR was recommended due to functional decline   Hospital Course: Samuel [name] was admitted to rehab 12/22/2022 for inpatient therapies to consist of PT and OT at least three hours five days a week. Past admission physiatrist, therapy team and rehab RN have worked together to provide customized collaborative inpatient rehab.  Flomax was added to help with voiding function and PVRs checked showing no evidence of retention.  Multiple laxatives added to help with constipation and this has been adjusted throughout his stay.  He has had limited use of pain medicines and bowel function is improving and he has been refusing laxatives therefore MiraLAX was DC'd and senna made as needed.  His voiding function has improved and he is  reporting recurrent issues with frequency therefore Flomax was DC'd.  He is currently voiding without difficulty.    Ensure supplements were ordered due to low calorie malnutrition and to help promote wound healing.  He has been tolerating diet without signs or symptoms of aspiration. Melatonin was added to help manage insomnia.   He  has been using Tylenol for pain management.  He did report discomfort due to poorly fitting cervical collar therefore this was changed out 03/24.His blood pressures were monitored on TID basis and have been controlled.  Follow-up labs show acute blood loss anemia is resolving.  Hyponatremia is stable and trending back up to 132. He has made steady gains during his stay and supervision is recommended for safety. He will continue to receive follow up outpatient PT and OT at [facility] after discharge.    Rehab course: During patient's stay in rehab weekly team conferences were held to monitor patient's progress, set goals and discuss barriers to discharge. At admission, patient required min assist with mobility and mod assist with ADL tasks. He  has had improvement in activity tolerance, balance, postural control as well as ability to compensate for deficits. He has had improvement in functional use RUE/LUE  and RLE/LLE as well as improvement in awareness. He is able to complete ADL tasks at modified independent level. He requires supervision for transfers and to ambulate 500' with use of RW and cues for safety. Family education has been completed.    Disposition: Home  Diet: Regular  Special Instructions: Wear collar at all times. May remove for showers No driving or strenuous activity. No lifting items over 5 lbs or over your head.  Discharge Instructions     Ambulatory referral to Occupational Therapy   Complete by: As directed    Eval and treat   Ambulatory referral to Physical Medicine Rehab   Complete by: As directed    Ambulatory referral to Physical  Therapy   Complete by: As directed    Eval and treat      Allergies as of 01/02/2023   No Known Allergies      Medication List     STOP taking these medications    enoxaparin 30 MG/0.3ML injection Commonly known as: LOVENOX   HYDROcodone-acetaminophen 5-325 MG tablet Commonly known as: NORCO/VICODIN       TAKE these medications    acetaminophen 325 MG tablet Commonly known as: TYLENOL Take 1-2 tablets (325-650 mg total) by mouth every 4 (four) hours as needed for mild pain.   diclofenac Sodium 1 % Gel Commonly known as: VOLTAREN Apply 2 g topically 4 (four) times daily.   melatonin 3 MG Tabs tablet Take 1 tablet (3 mg total) by mouth  at bedtime.   methocarbamol 500 MG tablet Commonly known as: ROBAXIN Take 1 tablet (500 mg total) by mouth every 6 (six) hours as needed for muscle spasms. Notes to patient: Or shoulder/neck stiffness/tightness   senna 8.6 MG Tabs tablet Commonly known as: SENOKOT Take 2 tablets (17.2 mg total) by mouth daily. What changed:  how much to take when to take this Notes to patient: For constipation--can adjust to 1-2 depending on constipation symptoms   traMADol 50 MG tablet--Rx# 28 tablets Commonly known as: ULTRAM Take 1 tablet (50 mg total) by mouth every 6 (six) hours as needed for moderate pain.        Follow-up Information     Camillia Herter, NP Follow up.   Specialty: Nurse Practitioner Why: Call in 1-2 days for post hospital follow up Contact information: [place] [address] [phone]         Callie Fielding, MD Follow up.   Specialty: Orthopedic Surgery Why: Call in 1-2 days for post hospital follow up Contact information: [place] [address] [phone]         Courtney Heys, MD Follow up.   Specialty: Physical Medicine and Rehabilitation Why: office will call you with follow up appointment Contact information: [id number]  [place] [address] [phone]                 Signed: Bary Leriche 01/02/2023, 1:15 PM

## 2023-01-02 NOTE — Progress Notes (Signed)
Physical Therapy Session Note  Patient Details  Name: Samuel [name] MRN: [id number] Date of Birth: [birth year]  Today's Date: 01/02/2023 PT Individual Time: 0900-1000 PT Individual Time Calculation (min): 60 min   Short Term Goals: Week 1:  PT Short Term Goal 1 (Week 1): STGs = LTGs  Skilled Therapeutic Interventions/Progress Updates:    Pt received in recliner and agreeable to therapy.  No complaint of pain. Pt ambulated at supervision and mod I level with RW throughout session, >200 ft at longest bout. Pt still with mildly ataxia gait, but overall able to self correct occasional LOB. Pt performed car transfer at high SUV height with running board and supervision. Navigated ramp, mulch, and curb with supervision, cues for RW proximity on uneven terrain. MMT performed in sitting documented in flow sheet. Pt also navigated 6" steps x 12 with supervision and 2 hand rails, last set with one hand rail for increased community access. Pt then ambulated to day room. Pt with questions about his surgery and why he needs to wear a brace. Therapist provided education on type of surgery, using pt's own x rays and online diagram for improved understanding. Pt was then able to reiterate why wearing brace in important and expressed greater understanding of procedure and condition. Pt then ambulated >200 ft back to room and returned to recliner, was left with all needs in reach and alarm active.   Therapy Documentation Precautions:  Precautions Precautions: Fall, Cervical Precaution Booklet Issued: Yes (comment) Precaution Comments: reviewed precautions. Required Braces or Orthoses: Cervical Brace Cervical Brace: Hard collar Restrictions Weight Bearing Restrictions: No Other Position/Activity Restrictions: Continue to reposition brace as pt likes to wear it loose. General:      Therapy/Group: Individual Therapy  Mickel Fuchs 01/02/2023, 12:25 PM

## 2023-01-02 NOTE — Progress Notes (Signed)
Physical Therapy Discharge Summary  Patient Details  Name: Samuel [name] MRN: [id number] Date of Birth: September 14, 1961  Date of Discharge from PT service:January 02, 2023  Today's Date: 01/02/2023 PT Individual Time: 1300-1345 PT Individual Time Calculation (min): 45 min    Patient has met 8 of 8 long term goals due to improved activity tolerance, improved balance, improved postural control, increased strength, ability to compensate for deficits, and improved coordination.  Patient to discharge at an ambulatory level Supervision.   Patient's care partner is independent to provide the necessary physical assistance at discharge.  Reasons goals not met: NA  Recommendation:  Patient will benefit from ongoing skilled PT services in outpatient setting to continue to advance safe functional mobility, address ongoing impairments in strength, balance, coordination, and minimize fall risk.  Equipment: RW  Reasons for discharge: treatment goals met and discharge from hospital  Patient/family agrees with progress made and goals achieved: Yes  Skilled Therapeutic Interventions/Progress Updates: pt received in bed and agreeable to therapy. No complaint of pain. Session focused on ambulation in community environment. Pt navigated to [facility] entrance with RW and supervision. Occ cueing or CGA when traversing thresholds. Pt ambulated over unlevel pavement and textured surfaces with RW and supervision, rest breaks x 2 over >1000 ft ambulation. Pt requested to weigh himself. Stepped on/off scale with CGA. Weight found to be 102.7lbs. Pt returned to room and was left with all needs in reach and alarm active.   PT Discharge Precautions/Restrictions Precautions Precautions: Fall;Cervical Precaution Comments: reviewed precautions. Required Braces or Orthoses: Cervical Brace Cervical Brace: Hard collar  Pain Pain Assessment Pain Score: 0-No pain Pain Interference Pain Interference Pain Effect on Sleep: 1.  Rarely or not at all Pain Interference with Therapy Activities: 1. Rarely or not at all Pain Interference with Day-to-Day Activities: 1. Rarely or not at all Vision/Perception  Vision - History Ability to See in Adequate Light: 0 Adequate Perception Perception: Within Functional Limits Praxis Praxis: Intact  Cognition Overall Cognitive Status: Within Functional Limits for tasks assessed Arousal/Alertness: Awake/alert Orientation Level: Oriented X4 Attention: Sustained Sustained Attention: Appears intact Memory: Appears intact Awareness: Appears intact Problem Solving: Appears intact Safety/Judgment: Appears intact Sensation Sensation Light Touch: Impaired by gross assessment Proprioception: Impaired by gross assessment;Appears Intact Additional Comments: Pt reports sensation improved from baseline Coordination Gross Motor Movements are Fluid and Coordinated: No Fine Motor Movements are Fluid and Coordinated: No Coordination and Movement Description: grossly dyscoordinated but improved from baseline. Motor  Motor Motor: Ataxia;Tetraplegia Motor - Skilled Clinical Observations: gross generalized weakness, L weaker than R Motor - Discharge Observations: Greatly improved from baseline  Mobility Transfers Transfers: Sit to Stand;Stand to Sit;Stand Pivot Transfers Sit to Stand: Independent with assistive device Stand to Sit: Independent with assistive device Stand Pivot Transfers: Independent with assistive device Transfer (Assistive device): Rolling walker Locomotion  Gait Ambulation: Yes Gait Assistance: Supervision/Verbal cueing Gait Distance (Feet): 500 Feet Assistive device: Rolling walker Gait Gait: Yes Gait Pattern: Impaired Gait Pattern: Ataxic Gait velocity: decreased Stairs / Additional Locomotion Stairs: Yes Stairs Assistance: Supervision/Verbal cueing Stair Management Technique: Two rails Number of Stairs: 12 Height of Stairs: 6 Ramp: Supervision/Verbal  cueing Curb: Supervision/Verbal cueing Wheelchair Mobility Wheelchair Mobility: No  Trunk/Postural Assessment  Cervical Assessment Cervical Assessment: Exceptions to [facility] [address] (cervical collar) Thoracic Assessment Thoracic Assessment: Within Functional Limits Lumbar Assessment Lumbar Assessment: Within Functional Limits Postural Control Postural Control: Within Functional Limits  Balance Balance Balance Assessed: Yes Static Sitting Balance Static Sitting - Balance Support: Feet supported  Static Sitting - Level of Assistance: 6: Modified independent (Device/Increase time) Dynamic Sitting Balance Dynamic Sitting - Balance Support: During functional activity Dynamic Sitting - Level of Assistance: 6: Modified independent (Device/Increase time) Static Standing Balance Static Standing - Balance Support: During functional activity Static Standing - Level of Assistance: 6: Modified independent (Device/Increase time) Dynamic Standing Balance Dynamic Standing - Balance Support: During functional activity Dynamic Standing - Level of Assistance: 5: Stand by assistance Extremity Assessment      RLE Assessment RLE Assessment: Exceptions to [facility] General Strength Comments: Hips grossly 4/5, otherwise WFL LLE Assessment LLE Assessment: Exceptions to [facility] General Strength Comments: Hip grossly 4-/5, otherwise [facility]   Inita Uram C Josanne Boerema 01/02/2023, 1:21 PM

## 2023-01-02 NOTE — Progress Notes (Signed)
Occupational Therapy Session Note  Patient Details  Name: DARREK ROHLIK MRN: [id number] Date of Birth: Dec 01, 1960  Today's Date: 01/02/2023 OT Individual Time: 1030-1058 OT Individual Time Calculation (min): 28 min    Short Term Goals: Week 2:  OT Short Term Goal 1 (Week 2): STG=LTG d/t ELOS  Skilled Therapeutic Interventions/Progress Updates:    Pt resting in recliner upon arrival. Pt amb with RW to gym for BUE strength/FMC assessment. Amb with supervision.   The Dynamometer Grip Strength Test is a quantitative and objective measure of isometric muscular strength of the hand and forearm.  -Instructions The patient was asked to sit with their back, pelvis, and knees at 90 degrees. The shoulder was adducted and neutrally rotated with The elbow flexed to 90 degrees and forearm in neutral. The arm was not supported.  -Results The score was determined by calculating the average of 3 trials. The pt's average score was 60 lbs in the R hand and 42 lbs in the L hand.  -Norms Males Average in lbs 55-59 R 101.1 L 83.2 60-64 R 89.7 L 76.8 65-69 R 91.1 L 76.8 70-74 R 75.3 L 64.8 75+ R 65.7 L 55.0  9 Hole Peg Test is used to measure finger dexterity in pts with various neurological diagnoses. - Instructions The pt was instructed to pick up the pegs one at a time, using their dominant hand first and put them into the holes in any order until the holes were all filled. The pt then removed the pegs one at a time and returned them to the container. Both hands were tested separately.  - Results The pt completed the test in 55.4 seconds with Rt and 128.1 with Lt. Scores are based on the time taken to complete the activity. The timer started the moment the pt touched the first peg until the moment the last peg hit the container.  - Norms for healthy males ages 62-70+ 69-55 R 18.9 L 19.84 56-60 R 20.90 L 21.64 61-65 R 20.87 L 21.60 66-70 R 21.23 L 22.29 71+ R 25.79 L  25.95  Pt returned to room and amb with RW into bathroom. Pt stood at toilet to void before returning to bed. Pt remained in bed with all needs within reach and bed alarm activated.   Therapy Documentation Precautions:  Precautions Precautions: Fall, Cervical Precaution Booklet Issued: Yes (comment) Precaution Comments: reviewed precautions. Required Braces or Orthoses: Cervical Brace Cervical Brace: Hard collar Restrictions Weight Bearing Restrictions: No Other Position/Activity Restrictions: Continue to reposition brace as pt likes to wear it loose.   Pain: Pt reports his neck feels better since receiving meds earlier; no pain reported  Therapy/Group: Individual Therapy  Jafet, Mcfeaters 01/02/2023, 11:09 AM

## 2023-01-03 NOTE — Progress Notes (Signed)
Inpatient Rehabilitation Care Coordinator Discharge Note   Patient Details  Name: Samuel [name] MRN: [id number] Date of Birth: Jan 30, 1961   Discharge location: D/c to home  Length of Stay: 11 days  Discharge activity level: Supervision  Home/community participation: Limited  Patient response [id number] Literacy - How often do you need to have someone help you when you read instructions, pamphlets, or other written material from your doctor or pharmacy?: Never  Patient response [id number] Isolation - How often do you feel lonely or isolated from those around you?: Never  Services provided included: MD, RD, PT, OT, RN, CM, Neuropsych, SW, Pharmacy, TR  Financial Services:  Charity fundraiser Utilized: [place] Medicaid  Choices offered to/list presented to: patient  Follow-up services arranged:  Outpatient, DME    Outpatient Servicies: [facility] for PT/OT DME : [place] for RW    Patient response to transportation need: Is the patient able to respond to transportation needs?: Yes In the past 12 months, has lack of transportation kept you from medical appointments or from getting medications?: No In the past 12 months, has lack of transportation kept you from meetings, work, or from getting things needed for daily living?: No    Comments (or additional information):  Patient/Family verbalized understanding of follow-up arrangements:  Yes  Individual responsible for coordination of the follow-up plan: contact pt [phone]  Confirmed correct DME delivered: Rana Snare 01/03/2023    Rana Snare

## 2023-01-03 NOTE — Progress Notes (Signed)
Patient ID: Samuel [name], male   DOB: May 26, 1961, 62 y.o.   MRN: [id number]  SW spoke with pt to confirm if RW delivered. Reports item not received.   SW waiting on updates from [place] about item.  *item delivered.  Loralee Pacas, MSW, [place] Office: [phone] Cell: [phone] Fax: [phone]

## 2023-01-03 NOTE — Progress Notes (Signed)
PROGRESS NOTE   Subjective/Complaints:  Pt says LBM yesterday- ready to go today Collar doing A  LOT BETTER  No issues- ready for d/c- nephew picking him up. PA will go over meds/d/c plans when nephew gets here.    ROS:   Pt denies SOB, abd pain, CP, N/V/C/D, and vision changes  Except for HPI   Objective:   No results found. Recent Labs    01/02/23 0655  WBC 5.3  HGB 11.6*  HCT 34.8*  PLT 347    Recent Labs    01/02/23 0655  NA 132*  K 4.0  CL 101  CO2 21*  GLUCOSE 100*  BUN 9  CREATININE 0.57*  CALCIUM 9.3    Intake/Output Summary (Last 24 hours) at 01/03/2023 0810 Last data filed at 01/03/2023 0700 Gross per 24 hour  Intake 836 ml  Output 600 ml  Net 236 ml        Physical Exam: Vital Signs Blood pressure 117/66, pulse 62, temperature 98.1 F (36.7 C), temperature source Oral, resp. rate 16, height 5\' 5"  (1.651 m), weight 41.8 kg, SpO2 100 %.       General: awake, alert, appropriate, sitting up EOB finishing breakfast- 100%; BMI 15; NAD HENT: conjugate gaze; oropharynx moist CV: regular rate; no JVD Pulmonary: CTA B/L; no W/R/R- good air movement GI: soft, NT, ND, (+)BS- slightly hyperactive- just ate Psychiatric: appropriate Neurological: Ox3  Grip 4/5 B/L and FA 4-/5 on L and 4/5 on R- is really improving  Musculoskeletal:     Comments: UE strength- biceps 4+/5; WE 4+ on R and 4/5 on L; triceps 4/5; Grip 3+/5; and FA 3+/5 B/L LE strength- HF 4/5; KE 4+/5; DF 4/5; and PF 4+/5 B/L   Skin:    General: Skin is warm and dry.     Comments: Cervical incision per neck -appears well-approximated Bruising and scabs between legs from prior use of brief  Neurological:     Mental Status: He is alert and oriented to person, place, and time.     Comments: Soft voice with moderate dysarthria and slurring at times. He was able to follow simple commands without difficulty. BUE weakness with  sensory deficits. BLE with significant ataxia.  C6 sensory level decreased to light touch down to T2- torso is intact per pt- but L1-S4 is decreased to light touch   Assessment/Plan: 1. Functional deficits which require 3+ hours per day of interdisciplinary therapy in a comprehensive inpatient rehab setting. Physiatrist is providing close team supervision and 24 hour management of active medical problems listed below. Physiatrist and rehab team continue to assess barriers to discharge/monitor patient progress toward functional and medical goals  Care Tool:  Bathing    Body parts bathed by patient: Right arm, Left arm, Chest, Abdomen, Front perineal area, Buttocks, Right upper leg, Left upper leg, Right lower leg, Left lower leg, Face         Bathing assist Assist Level: Independent with assistive device     Upper Body Dressing/Undressing Upper body dressing   What is the patient wearing?: Pull over shirt, Orthosis Orthosis activity level: Performed by helper  Upper body assist Assist Level: Minimal Assistance -  Patient > 75%    Lower Body Dressing/Undressing Lower body dressing      What is the patient wearing?: Pants, Underwear/pull up     Lower body assist Assist for lower body dressing: Independent with assitive device     Toileting Toileting    Toileting assist Assist for toileting: Independent with assistive device     Transfers Chair/bed transfer  Transfers assist     Chair/bed transfer assist level: Independent with assistive device Chair/bed transfer assistive device: Museum/gallery exhibitions officer assist      Assist level: Supervision/Verbal cueing Assistive device: Walker-rolling Max distance: 600   Walk 10 feet activity   Assist     Assist level: Contact Guard/Touching assist Assistive device: Walker-rolling   Walk 50 feet activity   Assist    Assist level: Supervision/Verbal cueing Assistive device: Walker-rolling     Walk 150 feet activity   Assist Walk 150 feet activity did not occur: Safety/medical concerns  Assist level: Supervision/Verbal cueing Assistive device: Walker-rolling    Walk 10 feet on uneven surface  activity   Assist     Assist level: Supervision/Verbal cueing Assistive device: Walker-rolling   Wheelchair     Assist Is the patient using a wheelchair?: No Type of Wheelchair: Manual    Wheelchair assist level: Dependent - Patient 0% (Only used as transport chair) Max wheelchair distance: 150'    Wheelchair 50 feet with 2 turns activity    Assist        Assist Level: Dependent - Patient 0%   Wheelchair 150 feet activity     Assist      Assist Level: Dependent - Patient 0%   Blood pressure 117/66, pulse 62, temperature 98.1 F (36.7 C), temperature source Oral, resp. rate 16, height 5\' 5"  (1.651 m), weight 41.8 kg, SpO2 100 %.  Medical Problem List and Plan: 1. Functional deficits secondary to cervical myelopathy with C5 incomplete ASIA D SCI s/p C3/4 ACDF             -patient may  shower- cover incision             -ELOS/Goals: 7-10 days mod I  D/c 3/26- pt doesn't want to leave then- he wants 3/22  D/c 3/26  D/c today- will need f/u with Dr Dagoberto Ligas in 4-6 weeks and Surgery in 4 weeks   2.  Antithrombotics: -DVT/anticoagulation:  Pharmaceutical: Lovenox             -antiplatelet therapy: N/a 3. Pain Management: hydrocodone prn.              --will add gabapentin for neuropathic symptoms.    3/15- says mainly taking tylenol- since hx of addiction- trying to avoid.   3/18- sore this AM, but tolerable  3/19- sore/tired this Am- therapy pushed him yesterday- con't regimen  3/20- collar is his main issue, but tolerating pain/soreness  3/21- says pain almost  gone- 1/10 this AM- did get volateren x1- will make sure QID scheduled  3/23 Will try smaller C collar-ordered  3/24 appears to have improved comfort new new C-collar   3/25- doing  better  3/26- SO MUCH better per pt 4. Mood/Behavior/Sleep: LCSW to follow for evaluation and support.              -antipsychotic agents: N/A             --Discussed compliance with collar. Melatonin added for insomnia.Don't want to do Trazodone because concern for  urinary retention   3/19- decreased melatonin to 3 mg QHS 5. Neuropsych/cognition: This patient is capable of making decisions on his own behalf. 6. Skin/Wound Care: Routine pressure relief measures.  7. Fluids/Electrolytes/Nutrition: Monitor I/O. Check CMET in am 8. ABLA: Recheck CBC in am  3/15- Hb stable at 10.6  3/18- Hb 11.5  3/26- Hb 11.6 9. Electrolyte abnormalities: Recheck BMET in am to follow up on Sodium and potassium levels. 10. Low calorie severe malnutrition: BMI 15Continue ensure bid.  3/18- Albumin 3.3 but BMI 15- needs to gain some weight- doing supplements  3/22- encouraging pt to finish his breakfast  3/24 eating most of his meals, continue to monitor  3/26- seen by dietician- eating 80-100% of meals-  11. Dysphagia: Discussed edema/surgery leading to aspiration risk/PNA --patient does not want diet modified. Went over risks of pneumonia, and spesis, etc- pt emphatic doesn't want nectar thick liquids 3/22- appears to be resolved 12. Neurogenic bladder/Frequency: Will check voiding function with bladder scan for PVRs. - sounds like is automatically double voiding- will start Flomax 0.4 mg qsupper  3/15- PVR's 0-6cc- so doing well  3/18- PVRs low- voiding well  3/19- voiding well- no PVRS/Bladder scans anymore- will d/c  3/20- still having frequency- will stop Flomax- see if that helps frequency, before starting Oxybutynin  3/21- think it's better- slept better with Flomax off.   3/23 improved, has been continent of bladder  3/24 reports this has improved, continue to monitor  3/26- resolved 13. Bowel urgency with constipation: Will reduce colace to once daily and monitor. But also feels constipated- will  see if needs to be cleaned out?  3/15- will give sorbitol- since pt feels constipated- 45 cc- doesn't want enema or Suppository- did have large BM             3/16 - Small bowel movement overnight, refused sorbitol, increase Colace to twice daily and MiraLAX to daily,  3/17 -Medium bowel movement this a.m., feels much improved  3/19- added Senna 2 tabs qday and will try Sorbitol again after therapy- LBM 2 days ago and was small. Addendum- just had large BM- stopped Sorbitol order  3/21- LBM yesterday  3/22- Will stop Colace 100 mg BID- con't Senna 2 tabs daily, but might need to stop- not taking pain meds  3/24  Reports regular Bms, no concerns  3/26- LBM yesterday but just about to go.  14. Insomnia  3/18- "out of it" this AM- will decrease melatonin to 3 mg QHS    3/19- more "with it" this AM, but still sleepy- not a morning person per pt.  15. Hyponatremia  3/19- Na down to 129- not sure why- but first time below 132- will recheck in AM and treat as appropriate.   3/20- Na 130- back up slightly- will con't to monitor  3/21- will check Monday  3/25- Na 132- doing better    LOS: 12 days A FACE TO FACE EVALUATION WAS PERFORMED  Sevilla Murtagh 01/03/2023, 8:10 AM

## 2023-01-04 ENCOUNTER — Telehealth: Payer: Self-pay

## 2023-01-04 NOTE — Transitions of Care (Post Inpatient/ED Visit) (Signed)
   01/04/2023  Name: Samuel [name] MRN: [id number] DOB: 08/26/1961  Today's TOC FU Call Status: Today's TOC FU Call Status:: Successful TOC FU Call Competed TOC FU Call Complete Date: 01/04/23  Transition Care Management Follow-up Telephone Call Date of Discharge: 01/03/23 [facility] Type of Discharge: Inpatient Admission Primary Inpatient Discharge Diagnosis:: cervical myelopathy How have you been since you were released from the hospital?: Better Any questions or concerns?: No  Items Reviewed: Did you receive and understand the discharge instructions provided?: Yes Medications obtained and verified?: Yes (Medications Reviewed) (He said he has all of his medications and did not have any questions about the med regime or need to review the med list.) Any new allergies since your discharge?: No Dietary orders reviewed?: Yes Type of Diet Ordered:: heart healthy Do you have support at home?: Yes People in Home: alone Name of Support/Comfort Primary Source: friends  Home Care and Equipment/Supplies: [place] Ordered?: No (He has been referred to outpatient PT,OT) Any new equipment or medical supplies ordered?: Yes Name of Medical supply agency?: [facility] Were you able to get the equipment/medical supplies?: Yes Do you have any questions related to the use of the equipment/supplies?: No  Functional Questionnaire: Do you need assistance with bathing/showering or dressing?: No (He said he just got home from the hospital yesterday and has been doing th best he can independently.) Do you need assistance with meal preparation?: Yes Do you need assistance with eating?: No Do you have difficulty maintaining continence: No Do you need assistance with getting out of bed/getting out of a chair/moving?: Yes (wears cervical brace, has RW to use with ambulation) Do you have difficulty managing or taking your medications?: No  Follow up  appointments reviewed: PCP Follow-up appointment confirmed?: Yes Date of PCP follow-up appointment?: 01/17/23 Follow-up Provider: Juluis Mire, NP- he said he would like a PCP closer to where he lives, [facility] [place] far. He can establish care at [facility] after his HFU appt. [facility] Follow-up appointment confirmed?: Yes Date of Specialist follow-up appointment?: 02/01/23 Follow-Up Specialty Provider:: [facility] Do you need transportation to your follow-up appointment?: No Do you understand care options if your condition(s) worsen?: Yes-patient verbalized understanding  Reminder for appointment 01/17/2023 mailed to patient     SIGNATURE  Eden Lathe, RN

## 2023-01-05 NOTE — Therapy (Signed)
OUTPATIENT OCCUPATIONAL THERAPY NEURO EVALUATION  Patient Name: Samuel [name] MRN: [id number] DOB:1961-09-21, 62 y.o., male Today's Date: 01/09/2023  PCP: Durene Fruits, NP REFERRING PROVIDER: Bary Leriche, PA-C   END OF SESSION:  OT End of Session - 01/09/23 0802     Visit Number 1    Number of Visits 7    Date for OT Re-Evaluation 02/24/23    Authorization Type [employer] - no auth required    OT Start Time 0803    OT Stop Time 0846    OT Time Calculation (min) 43 min    Activity Tolerance Patient tolerated treatment well;No increased pain;Patient limited by fatigue;Patient limited by lethargy    Behavior During Therapy [facility] for tasks assessed/performed             Past Medical History:  Diagnosis Date   Alcohol abuse    Cervical myelopathy    Coma    was in hospital for 3+ months in the 80's--carbon monoxide poisoning.   Elevated LFTs    Gallbladder polyp    Pancreatitis    Renal cyst, left    Past Surgical History:  Procedure Laterality Date   ANTERIOR CERVICAL DECOMP/DISCECTOMY FUSION N/A 12/16/2022   Procedure: CERVICAL THREE FOUR ANTERIOR CERVICAL DISCENTOMY AND FUSION;  Surgeon: Callie Fielding, MD;  Location: [place];  Service: Orthopedics;  Laterality: N/A;   NO PAST SURGERIES     Patient Active Problem List   Diagnosis Date Noted   Acute blood loss anemia 01/02/2023   Malnutrition of moderate degree 12/23/2022   Neurogenic bladder 12/22/2022   Cervical myelopathy 12/16/2022   Pancreatic stones    Alcohol-induced chronic pancreatitis    Abnormal LFTs    Pancreatic duct dilated 11/18/2018   Transaminitis 09/11/2018   HYPOKALEMIA 07/28/2010   TOBACCO ABUSE 07/28/2010   COCAINE ABUSE 07/28/2010   NEUROPATHY 07/28/2010   Hyponatremia 07/12/2010   Alcohol abuse 07/12/2010   Acute pancreatitis 07/12/2010    ONSET DATE: 12/16/22 DOS (C3-4 fusion)   REFERRING DIAG: G95.9 (ICD-10-CM) - Cervical myelopathy (HCC)   THERAPY DIAG:  Other lack of  coordination  Muscle weakness (generalized)  Paresthesia of skin  Rationale for Evaluation and Treatment: Rehabilitation  SUBJECTIVE:   SUBJECTIVE STATEMENT: He states before his neck fusion ~3 weeks ago, he couldn't feel his hands, feet, or have sensation to peritoneal areas. Now these are improving, as well as his functional endurance. He states he can stand unassisted for ~3 mins now, but usually uses rollator. He states he has to "plan ahead" to use the bathroom.  He uses access Lucianne Lei to arrive today. He got home from hospital last Tues.  PERTINENT HISTORY: Now 3+ weeks s/p cervical fusion C3-4.  Per in-patient rehab OT notes: "62 yo M admitted to [facility] on 3/8 for cervical myelopathy, s/p C3-4 ACDF. PMH: cocaine and ETOH abuse"  In the hospital he was weak b/l UEs L > R, and D/C at [facility] for LB BADLs and Sup - Mod I for other BADLs.   PRECAUTIONS: Cervical (defer to PT, but light AROM can typically start @ week 4 post-op); WEIGHT BEARING RESTRICTIONS: Yes <5# now, or until cleared by MD   PAIN:  Are you having pain? No but has numbness, tingling he rates "mild" today nad generally improving in hands, feet, peritoneal   FALLS: Has patient fallen in last 6 months? Yes. Number of falls 1-2 times (all before sx)   LIVING ENVIRONMENT: Lives with:  his roommate  Stairs: No Has following equipment at home: Gilford Rile - 2 wheeled, shower chair, and rollator, built up foam handles, therapy putty  PLOF: Requires assistive device for independence, Needs assistance with homemaking, and he states his roommate even helped him to the bathroom pre-sx. This had been "going on for a while"  PATIENT GOALS: To get better balance, functional endurance and use of his hands for basic home tasks  OBJECTIVE: (All objective assessments below are from initial evaluation on: 01/09/23 unless otherwise specified.)    HAND DOMINANCE: Right   ADLs: Overall ADLs: overall he's functioning at distant supervision to Mod I  level for basic ADLS, most difficulty due to c-collar, poor use of hands and poor balance/fnl endurance  Transfers/ambulation related to ADLs: supervision Eating: Mod I  Grooming: Mod I  UB Dressing: Mod I  LB Dressing: supervision at times for safety  Toileting: supervision at times for safety  Bathing: assist for back washing only  Tub Shower transfers: safety supervision only  Equipment: Transfer tub bench, Long handled sponge, and Feeding equipment  IADLs: Shopping: supervision  Light housekeeping: Supervision  Meal Prep: Supervision  Community mobility: Supervision  Medication management: Mod I  Financial management: Mod I   MOBILITY STATUS: Hx of falls, difficulty carrying objections with ambulation, and supervision to Mod I  ACTIVITY TOLERANCE: Activity tolerance: better since sx, but chronically impaired to <5 mins standing unsupported and <15 mins supported tasks   FUNCTIONAL OUTCOME MEASURES: Eval: Quck DASH 22% impairment today  (Higher % Score  =  More Impairment)     UPPER EXTREMITY ROM     Shoulder to Wrist AROM Right eval Left eval  Shoulder flexion 118 100  Shoulder abduction    Shoulder extension    Shoulder internal rotation    Shoulder external rotation    Elbow flexion Full Full  Elbow extension Full Full  Wrist flexion [facility] WFL  Wrist extension WFL WFL  (Blank rows = not tested)   Hand AROM Right eval Left eval  Full Fist Ability (or Gap to Distal Palmar Crease) Full Full  Thumb Opposition to Small Finger (or Gap) Full to SF Full to SF  (Blank rows = not tested)   UPPER EXTREMITY MMT:    Eval:  NT at eval due to recent and still healing injuries. Will be tested when appropriate, but grossly 3/5 MMT or 3-/5MMT in b/l UEs.   MMT Right TBD Left TBD  Shoulder flexion    Shoulder abduction    Shoulder adduction    Shoulder extension    Shoulder internal rotation    Shoulder external rotation    Middle trapezius    Lower trapezius     Elbow flexion    Elbow extension    Forearm supination    Forearm pronation    Wrist flexion    Wrist extension    Wrist ulnar deviation    Wrist radial deviation    (Blank rows = not tested)  HAND FUNCTION: Eval: Observed weakness in affected hand.  Grip strength Right: 60 lbs, Left: 50 lbs  Tip Pinch: Rt:  7# Lt: 8# Key Rt: 8# Lt: 7#  COORDINATION: Eval: Observed coordination impairments with BOTH hands- noted slower motion and decreased FMS. 9 Hole Peg Test Right: 1 min 13sec sec, Left: 28min 21sec  SENSATION: Eval: Static 2-pt discrimination Rt: Median 73mm, ulnar 21mm; Lt: Median 31mm, Ulnar 95mm  EDEMA:   Eval: N/A  MUSCLE TONE:  WFL bil UEs  COGNITION: Overall cognitive  status: Within functional limits for tasks assessed  VISION: Subjective report: wears glasses all the time, no c/o  OBSERVATIONS:   Eval: Seems like main issues are decreased fnl endurance, bil UE: FMS, ROM, strength, and decreased sensation.   TODAY'S TREATMENT:  Post-evaluation treatment: NA at eval   PATIENT EDUCATION: Education details: See tx section above for details  Person educated: Patient Education method: Verbal Instruction Education comprehension: States and demonstrates understanding, Additional Education required   HOME EXERCISE PROGRAM: TBD   GOALS: Goals reviewed with patient? Yes  SHORT TERM GOALS: Target date: 01/27/23  Pt will demo/state understanding of initial home exercise program (HEP) to improve function, pain, and independence.   Baseline: Needs a plan for rehabilitation  Goal status: INITIAL   LONG TERM GOALS: Target date: 02/24/23  Pt will improve functional ability by decreased impairment per Quick DASH assessment to 15% or better, for better quality of life. Baseline: 22% impaired  Goal status: INITIAL  2.  Pt will improve A/ROM in bil sh flex AROM to at least 130* each, to have prerequisite/functional motion for tasks like reach and grasp.  Baseline:  <120* for both Goal status: INITIAL  3.  Pt will improve strength in bil UEs to at least 4/5 MMT to have increased functional ability to carry out selfcare and higher-level homecare tasks with no difficulty Baseline: 3/5 MMT Goal status: INITIAL  4.  Pt will improve coordination skills in bil hands FMS, as seen by increasing score on 9HPT testing to at least <1 min, bilaterally to have increased functional ability to carry out fine motor tasks (fasteners, etc.) and more complex, coordinated IADLs (meal prep, sports, etc.).  Baseline: >58min 10sec bil Goal status: INITIAL  5.  Pt will increase static 2-point discrimination ability in Rt hand to <30mm to have better occupational participation in daily roles. Baseline: 24mm Median, 83mm Ulnar Goal status: INITIAL  6.  Pt will improve grip strength in Lt hand to at least 60lbs for functional use at home and in IADLs. Baseline: 50 lbs Goal status: INITIAL  ASSESSMENT:  CLINICAL IMPRESSION: Patient is a 62 y.o. male who was seen today for occupational therapy evaluation for difficulties with ADLs, functional endurance, balance, bilateral upper extremity range of motion and strength especially fine motor skills and sensation.  Some of these issues are more chronic, and he has some materials from inpatient rehab and has already been working on some of these things.  He will benefit from outpatient occupational therapy to increase the skills.  PERFORMANCE DEFICITS: in functional skills including ADLs, IADLs, coordination, dexterity, sensation, ROM, strength, flexibility, Fine motor control, body mechanics, endurance, decreased knowledge of precautions, and UE functional use, cognitive skills including problem solving and safety awareness, and psychosocial skills including coping strategies, environmental adaptation, and habits.   IMPAIRMENTS: are limiting patient from ADLs, IADLs, and rest and sleep.   CO-MORBIDITIES: may have co-morbidities  that  affects occupational performance. Patient will benefit from skilled OT to address above impairments and improve overall function.  MODIFICATION OR ASSISTANCE TO COMPLETE EVALUATION: No modification of tasks or assist necessary to complete an evaluation.  OT OCCUPATIONAL PROFILE AND HISTORY: Problem focused assessment: Including review of records relating to presenting problem.  CLINICAL DECISION MAKING: LOW - limited treatment options, no task modification necessary  REHAB POTENTIAL: Excellent  EVALUATION COMPLEXITY: Low    PLAN:  OT FREQUENCY: 1x/week  OT DURATION: 6 weeks  PLANNED INTERVENTIONS: self care/ADL training, therapeutic exercise, therapeutic activity, neuromuscular re-education, manual  therapy, passive range of motion, compression bandaging, moist heat, cryotherapy, contrast bath, patient/family education, visual/perceptual remediation/compensation, energy conservation, coping strategies training, DME and/or AE instructions, and Re-evaluation  RECOMMENDED OTHER SERVICES: is getting PT as well   CONSULTED AND AGREED WITH PLAN OF CARE: Patient  PLAN FOR NEXT SESSION: Provide/review his current HEP for FMS, hand strength UE ROM, and fnl endurance, etc.    Benito Mccreedy, OT 01/09/2023, 9:21 AM

## 2023-01-09 ENCOUNTER — Other Ambulatory Visit: Payer: Self-pay

## 2023-01-09 ENCOUNTER — Encounter: Payer: Self-pay | Admitting: Physical Therapy

## 2023-01-09 ENCOUNTER — Encounter: Payer: Self-pay | Admitting: Rehabilitative and Restorative Service Providers"

## 2023-01-09 ENCOUNTER — Ambulatory Visit: Payer: Medicaid Other | Admitting: Rehabilitative and Restorative Service Providers"

## 2023-01-09 ENCOUNTER — Ambulatory Visit: Payer: Medicaid Other | Attending: Family | Admitting: Physical Therapy

## 2023-01-09 VITALS — BP 131/77 | HR 61

## 2023-01-09 DIAGNOSIS — R269 Unspecified abnormalities of gait and mobility: Secondary | ICD-10-CM | POA: Insufficient documentation

## 2023-01-09 DIAGNOSIS — R278 Other lack of coordination: Secondary | ICD-10-CM | POA: Diagnosis present

## 2023-01-09 DIAGNOSIS — R2681 Unsteadiness on feet: Secondary | ICD-10-CM | POA: Insufficient documentation

## 2023-01-09 DIAGNOSIS — M6281 Muscle weakness (generalized): Secondary | ICD-10-CM | POA: Diagnosis present

## 2023-01-09 DIAGNOSIS — R202 Paresthesia of skin: Secondary | ICD-10-CM | POA: Insufficient documentation

## 2023-01-09 NOTE — Therapy (Signed)
OUTPATIENT PHYSICAL THERAPY NEURO EVALUATION   Patient Name: Samuel [name] MRN: [id number] DOB:[birth year], 62 y.o., male Today's Date: 01/09/2023   PCP: Durene Fruits, NP REFERRING PROVIDER: Bary Leriche, PA-C  END OF SESSION:  PT End of Session - 01/09/23 0851     Visit Number 1    Number of Visits 9    Date for PT Re-Evaluation 03/20/23    Authorization Type [place] Medicaid    Progress Note Due on Visit 10    PT Start Time 0850    PT Stop Time 0930    PT Time Calculation (min) 40 min    Equipment Utilized During Treatment Gait belt;Cervical collar    Activity Tolerance Patient tolerated treatment well    Behavior During Therapy WFL for tasks assessed/performed             Past Medical History:  Diagnosis Date   Alcohol abuse    Cervical myelopathy    Coma    was in hospital for 3+ months in the 80's--carbon monoxide poisoning.   Elevated LFTs    Gallbladder polyp    Pancreatitis    Renal cyst, left    Past Surgical History:  Procedure Laterality Date   ANTERIOR CERVICAL DECOMP/DISCECTOMY FUSION N/A 12/16/2022   Procedure: CERVICAL THREE FOUR ANTERIOR CERVICAL DISCENTOMY AND FUSION;  Surgeon: Callie Fielding, MD;  Location: [place];  Service: Orthopedics;  Laterality: N/A;   NO PAST SURGERIES     Patient Active Problem List   Diagnosis Date Noted   Acute blood loss anemia 01/02/2023   Malnutrition of moderate degree 12/23/2022   Neurogenic bladder 12/22/2022   Cervical myelopathy 12/16/2022   Pancreatic stones    Alcohol-induced chronic pancreatitis    Abnormal LFTs    Pancreatic duct dilated 11/18/2018   Transaminitis 09/11/2018   HYPOKALEMIA 07/28/2010   TOBACCO ABUSE 07/28/2010   COCAINE ABUSE 07/28/2010   NEUROPATHY 07/28/2010   Hyponatremia 07/12/2010   Alcohol abuse 07/12/2010   Acute pancreatitis 07/12/2010    ONSET DATE: 01/02/2023 (referral date)  REFERRING DIAG: G95.9 (ICD-10-CM) - Cervical myelopathy  THERAPY DIAG:  Abnormality of gait  and mobility - Plan: PT plan of care cert/re-cert  Unsteadiness on feet - Plan: PT plan of care cert/re-cert  Rationale for Evaluation and Treatment: Rehabilitation  SUBJECTIVE:                                                                                                                                                                                             SUBJECTIVE STATEMENT: Patient is a poor historian. Patient reports that two weeks before his surgery  he was unable to stand up due to weakness. He had a 4WW before this but was not needing it to get around. Patient reports that since surgery he was using a 2WW in hospital but has been using a rollator since he got home. Patient reports that he tries to walk short distance from his room to the bathroom without an AD. Patient furniture surfs if he begins to feel off balance. Patient reports that he is able to control bowel/bladder function now at home. Patient arrives to session in cervical collar. He reports that it is limiting his sleep but is sleeping better than when he was in the hospital.   OT reached out to surgeon who advised that patient will need cervical collar for at least 6 weeks given bone health.   Pt accompanied by: self  PERTINENT HISTORY: cervical myelopathy s/p ACDF C3/C4 on 12/16/22, chronic pancreatitis with diarrhea, GB polyps, abnormal LFTs, ETOH/Cocaine abuse in the  past (last (+) cocaine result 2012),  PAIN:  Are you having pain? No  PRECAUTIONS: Cervical, Fall, and Other: possible swallow with thickened liquids, (no lifting over 5lbs overhead)  WEIGHT BEARING RESTRICTIONS: No  FALLS: Has patient fallen in last 6 months? Yes. Number of falls 2 falls prior to surgery - catching foot on rug  LIVING ENVIRONMENT: Lives with:  lives with roommate who is able to help as needed Lives in: House/apartment Stairs: No Has following equipment at home: Gilford Rile - 2 wheeled, Environmental consultant - 4 wheeled, Electronics engineer, and Grab  bars  PLOF: Independent - required max assist 2 weeks before surgery due to level of weakness, history of 3 year of progressive weakness leading up to surgery but able to do ADLs/IADLs. Patient previously did yard work before last summer but given increase in weakness was unable to do this.    PATIENT GOALS: "Be able to walk without a walker."  OBJECTIVE:   DIAGNOSTIC FINDINGS:   12/29/2022: IMPRESSION: Postoperative changes of anterior fusion C3-C4.  11/08/2022:  IMPRESSION: 1. Cervical spondylosis most pronounced at the C3-C4 level where there is severe canal stenosis with cord compression and severe bilateral foraminal stenosis. 2. Focal T2 hyperintense signal within the cord at the C3-C4 level where the cord is diminutive in caliber suggesting chronic myelomalacia. 3. Mild-to-moderate canal stenosis at C5-6 and mild canal stenosis at C4-5 and C6-7. 4. Moderate-severe left foraminal stenosis at C7-T1.   COGNITION: Overall cognitive status:  mild impairment with history but grossly WNL   SENSATION: Impaired  COORDINATION: Impaired with heel to shin - dymetric with LE placement Alt supination/pronation - grossly WNL  EDEMA:  WNL  MUSCLE TONE:1+ bilaterally LE tone on Modified Ashworth Scale  POSTURE: rounded shoulders and neck motion limited by cervical collar set in neutral  LOWER EXTREMITY ROM:     Not formally assessed due to tone - mild to moderate restriction noted  LOWER EXTREMITY MMT:    Not formally assessed due to tone - mild to moderate restriction noted  TRANSFERS: Assistive device utilized: Environmental consultant - 4 wheeled  Sit to stand: SBA Stand to sit: SBA Chair to chair: SBA Reaches towards surfaces and poor awareness of chair alignment, increased reliance on UE use unless cued  GAIT: Gait pattern: Scissoring gait pattern intermittently, uneven step lengths, mild signs of unsteadiness, increased forefoot contact Distance walked: 2 x 60 feet Assistive  device utilized: Walker - 4 wheeled Level of assistance: SBA and CGA Comments: scissoring intermixed places at increased risk for falls  FUNCTIONAL TESTS:  5 times sit to stand: 16.9" without UE use, WBOS (SBA) Timed up and go (TUG): 19.79" with rollator + CGA-SBA 10 meter walk test: 17.88" or 0.56 m/s with rollator + CGA  Patient demonstrates decreased safety awareness of use of rollator and break use.   TODAY'S TREATMENT:                                                                                                                               Initial Eval only   PATIENT EDUCATION: Education details: POC, examination findings, results, goal collaboration, collar safety Person educated: Patient Education method: Explanation Education comprehension: verbalized understanding and needs further education  HOME EXERCISE PROGRAM: To be provided  GOALS: Goals reviewed with patient? Yes  SHORT TERM GOALS: Target date: 02/06/2023  Patient will demonstrate 100% compliance with initial HEP to continue to progress between physical therapy sessions.     Baseline: To be provided Goal status: INITIAL  2. Patient will improve gait speed to 0.66 m/s with LRAD to progress towards PLOF and demonstrate progress towards a reduced risk for falls.  Baseline: 17.88" or 0.56 m/s with rollator + CGA Goal status: INITIAL  3.  Patient will improve TUG score to 16 seconds or less with LRAD to indicate clinically significant progress towards a decreased risk of falls and improved mobility.  Baseline: 19.79" with rollator + CGA-SBA Goal status: INITIAL  4.  Patient will improve their 5x Sit to Stand score to less than 15 seconds to demonstrate a decreased risk for falls and improved LE strength.   Baseline: 16.9" without UE use, WBOS (SBA) Goal status: INITIAL  5.  Berg to be assessed and goal written Baseline: To be assessed Goal status: INITIAL   LONG TERM GOALS: Target date:  03/06/2023  Patient will report demonstrate independence with final HEP in order to maintain current gains and continue to progress after physical therapy discharge.   Baseline: To be provided Goal status: INITIAL  2.  Patient will improve gait speed to 0.70 m/s or less with LRAD to progress towards PLOF and demonstrate progress towards a reduced risk for falls.  Baseline: 17.88" or 0.56 m/s with rollator + CGA Goal status: INITIAL  3. Patient will improve TUG score to 15 seconds or less with LRAD to indicate clinically significant progress towards a decreased risk of falls and improved mobility.  Baseline: 19.79" with rollator + CGA-SBA Goal status: INITIAL  4.  Berg to be assessed and goal written Baseline: To be assessed Goal status: INITIAL   ASSESSMENT:  CLINICAL IMPRESSION: Patient is a 62 y.o. male who was seen today for physical therapy evaluation and treatment for impairments related to cervical myelopathy s/p ACDF C3/C4 on 12/16/22. Patient is at an increased risk for falls as indicated by gait speed, TUG, and 5xSTS. Patient also demonstrates decreased safety awareness and will require reinforcement of cervical precautions and AD training. Patient was previously working at high level doing  yard work and will need skilled physical therapy services to maximize physical function in order to progress towards PLOF and learn appropriate activity modification and compensatory strategies.   OBJECTIVE IMPAIRMENTS: Abnormal gait, decreased activity tolerance, decreased balance, decreased coordination, decreased knowledge of use of DME, decreased mobility, difficulty walking, decreased ROM, decreased strength, and decreased safety awareness.   ACTIVITY LIMITATIONS: carrying, bending, standing, squatting, and reach over head  PARTICIPATION LIMITATIONS: community activity, occupation, and yard work  PERSONAL FACTORS: Social background and 3+ comorbidities: see above  are also affecting  patient's functional outcome.   REHAB POTENTIAL: Good  CLINICAL DECISION MAKING: Evolving/moderate complexity  EVALUATION COMPLEXITY: Moderate  PLAN:  PT FREQUENCY: 1x/week  PT DURATION: 8 weeks  PLANNED INTERVENTIONS: Therapeutic exercises, Therapeutic activity, Neuromuscular re-education, Balance training, Gait training, Patient/Family education, Self Care, DME instructions, Manual therapy, and Re-evaluation  PLAN FOR NEXT SESSION: Berg to be assessed/goal written as indicated, intial HEP, rollator safety / training, cervical precautions, dynamic / static balance tasks with functional reaching  Esperanza Heir, PT, DPT 01/09/2023, 4:40 PM  Check all possible CPT codes: [phone] - PT Re-evaluation, 97110- Therapeutic Exercise, [phone]- Neuro Re-education, [phone] - Gait Training, [phone] - Manual Therapy, 97530 - Therapeutic Activities, and [phone] - Self Care    Check all conditions that are expected to impact treatment: Musculoskeletal disorders, Neurological condition, and Social determinants of health   If treatment provided at initial evaluation, no treatment charged due to lack of authorization.

## 2023-01-13 ENCOUNTER — Ambulatory Visit: Payer: Medicaid Other | Admitting: Physical Therapy

## 2023-01-13 ENCOUNTER — Ambulatory Visit: Payer: Medicaid Other | Admitting: Occupational Therapy

## 2023-01-16 ENCOUNTER — Ambulatory Visit: Payer: Medicaid Other | Admitting: Physical Therapy

## 2023-01-16 ENCOUNTER — Telehealth: Payer: Self-pay | Admitting: Rehabilitative and Restorative Service Providers"

## 2023-01-16 ENCOUNTER — Telehealth: Payer: Self-pay | Admitting: Orthopedic Surgery

## 2023-01-16 ENCOUNTER — Ambulatory Visit: Payer: Medicaid Other | Admitting: Rehabilitative and Restorative Service Providers"

## 2023-01-16 NOTE — Telephone Encounter (Signed)
Received call from Samuel [name] w/ [facility] checking status of PCS form. I advised it was faxed on 01/12/23 to [phone]. She asked for it to be re-faxed to different fax of [phone]. I faxed to [phone]. Ph [phone]

## 2023-01-16 NOTE — Telephone Encounter (Signed)
OT called listed cell #, but it was his sister's phone. She provided his new cell #, which OT called and left message about missed PT and OT appointments today, was asked to call to reschedule and made aware of next Monday apt at 845am.   He was asked to call if he cannot make it and check his schedule.

## 2023-01-17 ENCOUNTER — Encounter (INDEPENDENT_AMBULATORY_CARE_PROVIDER_SITE_OTHER): Payer: Self-pay | Admitting: Primary Care

## 2023-01-17 ENCOUNTER — Ambulatory Visit (INDEPENDENT_AMBULATORY_CARE_PROVIDER_SITE_OTHER): Payer: Medicaid Other | Admitting: Primary Care

## 2023-01-17 VITALS — BP 118/78 | HR 72 | Ht 65.0 in | Wt 97.4 lb

## 2023-01-17 DIAGNOSIS — Z7689 Persons encountering health services in other specified circumstances: Secondary | ICD-10-CM

## 2023-01-17 DIAGNOSIS — Z1211 Encounter for screening for malignant neoplasm of colon: Secondary | ICD-10-CM

## 2023-01-17 DIAGNOSIS — F17210 Nicotine dependence, cigarettes, uncomplicated: Secondary | ICD-10-CM | POA: Diagnosis not present

## 2023-01-17 NOTE — Progress Notes (Signed)
  [facility]   Subjective:   Samuel [name] is a 62 y.o. male presents for establish care. He wishes to change providers due to [facility] is closer to his house.  Admit date to the hospital was 12/22/22, patient was discharged from the hospital on 01/03/23, patient was admitted for: Cervical myelopathy wears a neck brace and participating in PT.     Past Medical History:  Diagnosis Date   Alcohol abuse    Cervical myelopathy    Coma    was in hospital for 3+ months in the 80's--carbon monoxide poisoning.   Elevated LFTs    Gallbladder polyp    Pancreatitis    Renal cyst, left      No Known Allergies    Current Outpatient Medications on File Prior to Visit  Medication Sig Dispense Refill   acetaminophen (TYLENOL) 325 MG tablet Take 1-2 tablets (325-650 mg total) by mouth every 4 (four) hours as needed for mild pain.     diclofenac Sodium (VOLTAREN) 1 % GEL Apply 2 g topically 4 (four) times daily. 400 g 0   melatonin 3 MG TABS tablet Take 1 tablet (3 mg total) by mouth at bedtime. 30 tablet 0   methocarbamol (ROBAXIN) 500 MG tablet Take 1 tablet (500 mg total) by mouth every 6 (six) hours as needed for muscle spasms. 60 tablet 0   senna (SENOKOT) 8.6 MG TABS tablet Take 2 tablets (17.2 mg total) by mouth daily. 60 tablet 0   traMADol (ULTRAM) 50 MG tablet Take 1 tablet (50 mg total) by mouth every 6 (six) hours as needed for moderate pain. 28 tablet 0   No current facility-administered medications on file prior to visit.     Review of System: Comprehensive ROS Pertinent positive and negative noted in HPI    Objective:  Blood Pressure 118/78   Pulse 72   Height 5\' 5"  (1.651 m)   Weight 97 lb 6.4 oz (44.2 kg)   Oxygen Saturation 99%   Body Mass Index 16.21 kg/m   Filed Weights   01/17/23 0942  Weight: 97 lb 6.4 oz (44.2 kg)    Physical Exam: General Appearance: Well nourished, in no apparent distress. Eyes: PERRLA, EOMs, conjunctiva no swelling or  erythema Sinuses: No Frontal/maxillary tenderness ENT/Mouth: Ext aud canals clear, TMs without erythema, bulging. No erythema, swelling, or exudate on post pharynx.  Tonsils not swollen or erythematous. Hearing normal.  Neck: Supple, thyroid normal.  Respiratory: Respiratory effort normal, BS equal bilaterally without rales, rhonchi, wheezing or stridor.  Cardio: RRR with no MRGs. Brisk peripheral pulses without edema.  Abdomen: Soft, + BS.  Non tender, no guarding, rebound, hernias, masses. Musculoskeletal: abnormal gait.  Skin: Warm, dry without rashes, lesions, ecchymosis.  Neuro: Cranial nerves intact. Normal muscle tone, no cerebellar symptoms. Sensation intact.  Psych: Awake and oriented X 3, normal affect, Insight and Judgment appropriate.    Assessment:  Samuel [name] was seen today for hospitalization follow-up.  Diagnoses and all orders for this visit:  Screening for colon cancer -     Fecal occult blood, imunochemical; Future  Smokes more than 2 packs a day with greater than 20 pack year history -     Ambulatory Referral Lung Cancer Screening [facility]  Encounter to establish care     This note has been created with Education officer, environmental. Any transcriptional errors are unintentional.   Grayce Sessions, NP 01/17/2023, 10:11 AM

## 2023-01-23 ENCOUNTER — Ambulatory Visit: Payer: Medicaid Other | Admitting: Rehabilitative and Restorative Service Providers"

## 2023-01-23 ENCOUNTER — Ambulatory Visit: Payer: Medicaid Other | Admitting: Physical Therapy

## 2023-01-23 ENCOUNTER — Telehealth: Payer: Self-pay | Admitting: Physical Therapy

## 2023-01-23 NOTE — Telephone Encounter (Signed)
Called patient and spoke on phone with patient; reminded of 3 cancellation/no-show policy. Patient has had 1 no show and 2 cancellations. Reminded that next missed visit would be a D/C with need for new referral to return to PT. Reminded of next visit. Patient verbalized understanding and was agreeable.  Maryruth Eve, PT, DPT

## 2023-01-30 ENCOUNTER — Ambulatory Visit: Payer: Medicaid Other | Admitting: Physical Therapy

## 2023-01-30 ENCOUNTER — Encounter: Payer: Self-pay | Admitting: Physical Therapy

## 2023-01-30 ENCOUNTER — Ambulatory Visit: Payer: Medicaid Other | Admitting: Rehabilitative and Restorative Service Providers"

## 2023-01-30 ENCOUNTER — Encounter: Payer: Self-pay | Admitting: Rehabilitative and Restorative Service Providers"

## 2023-01-30 VITALS — BP 144/85 | HR 84

## 2023-01-30 DIAGNOSIS — R278 Other lack of coordination: Secondary | ICD-10-CM

## 2023-01-30 DIAGNOSIS — R2681 Unsteadiness on feet: Secondary | ICD-10-CM

## 2023-01-30 DIAGNOSIS — R269 Unspecified abnormalities of gait and mobility: Secondary | ICD-10-CM | POA: Diagnosis not present

## 2023-01-30 DIAGNOSIS — R202 Paresthesia of skin: Secondary | ICD-10-CM

## 2023-01-30 DIAGNOSIS — M6281 Muscle weakness (generalized): Secondary | ICD-10-CM

## 2023-01-30 NOTE — Therapy (Signed)
OUTPATIENT PHYSICAL THERAPY NEURO TREATMENT   Patient Name: Samuel [name] MRN: [id number] DOB:1961/07/15, 62 y.o., male Today's Date: 01/30/2023   PCP: Ricky Stabs, NP REFERRING PROVIDER: Jacquelynn Cree, PA-C  END OF SESSION:  PT End of Session - 01/30/23 0857     Visit Number 2    Number of Visits 9    Date for PT Re-Evaluation 03/20/23    Authorization Type [employer]    Progress Note Due on Visit 10    PT Start Time [phone]   patient arrived late to session   PT Stop Time 0930    PT Time Calculation (min) 38 min    Equipment Utilized During Treatment Gait belt;Cervical collar    Activity Tolerance Patient tolerated treatment well    Behavior During Therapy WFL for tasks assessed/performed             Past Medical History:  Diagnosis Date   Alcohol abuse    Cervical myelopathy    Coma    was in hospital for 3+ months in the 80's--carbon monoxide poisoning.   Elevated LFTs    Gallbladder polyp    Pancreatitis    Renal cyst, left    Past Surgical History:  Procedure Laterality Date   ANTERIOR CERVICAL DECOMP/DISCECTOMY FUSION N/A 12/16/2022   Procedure: CERVICAL THREE FOUR ANTERIOR CERVICAL DISCENTOMY AND FUSION;  Surgeon: London Sheer, MD;  Location: [facility] OR;  Service: Orthopedics;  Laterality: N/A;   NO PAST SURGERIES     Patient Active Problem List   Diagnosis Date Noted   Acute blood loss anemia 01/02/2023   Malnutrition of moderate degree 12/23/2022   Neurogenic bladder 12/22/2022   Cervical myelopathy 12/16/2022   Pancreatic stones    Alcohol-induced chronic pancreatitis    Abnormal LFTs    Pancreatic duct dilated 11/18/2018   Transaminitis 09/11/2018   HYPOKALEMIA 07/28/2010   TOBACCO ABUSE 07/28/2010   COCAINE ABUSE 07/28/2010   NEUROPATHY 07/28/2010   Hyponatremia 07/12/2010   Alcohol abuse 07/12/2010   Acute pancreatitis 07/12/2010    ONSET DATE: 01/02/2023 (referral date)  REFERRING DIAG: G95.9 (ICD-10-CM) - Cervical  myelopathy  THERAPY DIAG:  Abnormality of gait and mobility  Unsteadiness on feet  Rationale for Evaluation and Treatment: Rehabilitation  SUBJECTIVE:                                                                                                                                                                                             SUBJECTIVE STATEMENT: Patient to see surgeon this week for possible clearance outside of collar. Denies falls/near falls. States he has been exercising since  last here.  OT reached out to surgeon who advised that patient will need cervical collar for at least 6 weeks given bone health.   Pt accompanied by: self  PERTINENT HISTORY: cervical myelopathy s/p ACDF C3/C4 on 12/16/22, chronic pancreatitis with diarrhea, GB polyps, abnormal LFTs, ETOH/Cocaine abuse in the  past (last (+) cocaine result 2012),  PAIN:  Are you having pain? No  PRECAUTIONS: Cervical, Fall, and Other: possible swallow with thickened liquids, (no lifting over 5lbs overhead)  WEIGHT BEARING RESTRICTIONS: No  FALLS: Has patient fallen in last 6 months? Yes. Number of falls 2 falls prior to surgery - catching foot on rug  PATIENT GOALS: "Be able to walk without a walker."  OBJECTIVE:   DIAGNOSTIC FINDINGS:   12/29/2022: IMPRESSION: Postoperative changes of anterior fusion C3-C4.  11/08/2022:  IMPRESSION: 1. Cervical spondylosis most pronounced at the C3-C4 level where there is severe canal stenosis with cord compression and severe bilateral foraminal stenosis. 2. Focal T2 hyperintense signal within the cord at the C3-C4 level where the cord is diminutive in caliber suggesting chronic myelomalacia. 3. Mild-to-moderate canal stenosis at C5-6 and mild canal stenosis at C4-5 and C6-7. 4. Moderate-severe left foraminal stenosis at C7-T1.   COGNITION: Overall cognitive status:  mild impairment with history but grossly WNL   TODAY'S TREATMENT:                                                                                                                                Vitals:   01/30/23 0859  BP: (!) 144/85  Pulse: 84   TherAct:  [facility] PT Assessment - 01/30/23 0001       Standardized Balance Assessment   Standardized Balance Assessment Berg Balance Test      Berg Balance Test   Sit to Stand Able to stand using hands after several tries    Standing Unsupported Able to stand safely 2 minutes    Sitting with Back Unsupported but Feet Supported on Floor or Stool Able to sit safely and securely 2 minutes    Stand to Sit Uses backs of legs against chair to control descent    Transfers Able to transfer with verbal cueing and /or supervision    Standing Unsupported with Eyes Closed Able to stand 10 seconds with supervision    Standing Unsupported with Feet Together Able to place feet together independently and stand for 1 minute with supervision    From Standing, Reach Forward with Outstretched Arm Can reach forward >5 cm safely (2")    From Standing Position, Pick up Object from Floor Able to pick up shoe, needs supervision    From Standing Position, Turn to Look Behind Over each Shoulder Needs assist to keep from losing balance and falling   held due to cervical precautions   Turn 360 Degrees Needs assistance while turning    Standing Unsupported, Alternately Place Feet on Step/Stool Needs assistance to keep from falling or unable  to try    Standing Unsupported, One Foot in Front Needs help to step but can hold 15 seconds    Standing on One Leg Unable to try or needs assist to prevent fall    Total Score 26             Education on proper rollator use with ambulation x 200 feet with work on pushing rollator against wall, locking breaks, and sitting down. Discussed rollator not to be used as wheelchair. Practiced sit to stands and transfers with proper use of breaks. Initially required mod-max cues but demonstrated appropriate understanding by end of  session. Will require reinforcement. Reinforced cervical precautions and adjusted collar as well as patient had collar extremely loose prior to initiation of session.   Initiated HEP as noted below and practiced with SBA during session x 10  PATIENT EDUCATION: Education details: Sharlene Motts results, intial HEP, walker safety Person educated: Patient Education method: Explanation, Demonstration, and Handouts Education comprehension: verbalized understanding and needs further education  HOME EXERCISE PROGRAM: Access Code: [email or web address] URL: [email or web address] Date: 01/30/2023 Prepared by: Maryruth Eve  Exercises - Sit to Stand  - 1 x daily - 7 x weekly - 3 sets - 10 reps  GOALS: Goals reviewed with patient? Yes  SHORT TERM GOALS: Target date: 02/06/2023  Patient will demonstrate 100% compliance with initial HEP to continue to progress between physical therapy sessions.     Baseline: To be provided Goal status: INITIAL  2. Patient will improve gait speed to 0.66 m/s with LRAD to progress towards PLOF and demonstrate progress towards a reduced risk for falls.  Baseline: 17.88" or 0.56 m/s with rollator + CGA Goal status: INITIAL  3.  Patient will improve TUG score to 16 seconds or less with LRAD to indicate clinically significant progress towards a decreased risk of falls and improved mobility.  Baseline: 19.79" with rollator + CGA-SBA Goal status: INITIAL  4.  Patient will improve their 5x Sit to Stand score to less than 15 seconds to demonstrate a decreased risk for falls and improved LE strength.   Baseline: 16.9" without UE use, WBOS (SBA) Goal status: INITIAL  5.  Patient will improve Berg Balance score by 5 points to indicate clinically significant progress towards a decreased risk of falls and improved static stability.   Baseline: 26/56 Goal status: INITIAL   LONG TERM GOALS: Target date: 03/06/2023  Patient will report demonstrate independence with final HEP in  order to maintain current gains and continue to progress after physical therapy discharge.   Baseline: To be provided Goal status: INITIAL  2.  Patient will improve gait speed to 0.70 m/s or less with LRAD to progress towards PLOF and demonstrate progress towards a reduced risk for falls.  Baseline: 17.88" or 0.56 m/s with rollator + CGA Goal status: INITIAL  3. Patient will improve TUG score to 15 seconds or less with LRAD to indicate clinically significant progress towards a decreased risk of falls and improved mobility.  Baseline: 19.79" with rollator + CGA-SBA Goal status: INITIAL  4.  Patient will improve Berg Balance score by 10 points to indicate clinically significant progress towards a decreased risk of falls and improved static stability.   Baseline: 26/56 Goal status: INITIAL   ASSESSMENT:  CLINICAL IMPRESSION: Session emphasized assessment of Berg balance test in addition to education on rollator safety and reinforcement of cervical precautions. Initiated one exercise on HEP but patient will benefit from additions next session. Patient demonstrated appropriate understanding of rollator  safety by end of session but will likely require reinforcement. Continue POC.   OBJECTIVE IMPAIRMENTS: Abnormal gait, decreased activity tolerance, decreased balance, decreased coordination, decreased knowledge of use of DME, decreased mobility, difficulty walking, decreased ROM, decreased strength, and decreased safety awareness.   ACTIVITY LIMITATIONS: carrying, bending, standing, squatting, and reach over head  PARTICIPATION LIMITATIONS: community activity, occupation, and yard work  PERSONAL FACTORS: Social background and 3+ comorbidities: see above  are also affecting patient's functional outcome.   REHAB POTENTIAL: Good  CLINICAL DECISION MAKING: Evolving/moderate complexity  EVALUATION COMPLEXITY: Moderate  PLAN:  PT FREQUENCY: 1x/week  PT DURATION: 8 weeks  PLANNED  INTERVENTIONS: Therapeutic exercises, Therapeutic activity, Neuromuscular re-education, Balance training, Gait training, Patient/Family education, Self Care, DME instructions, Manual therapy, and Re-evaluation  PLAN FOR NEXT SESSION: add to initial HEP, rollator safety / training, cervical precautions, dynamic / static balance tasks with functional reaching, stepping to targets  Carmelia Bake, PT, DPT 01/30/2023, 3:10 PM  Check all possible CPT codes: 16109 - PT Re-evaluation, 97110- Therapeutic Exercise, [phone]- Neuro Re-education, [phone] - Gait Training, [phone] - Manual Therapy, 97530 - Therapeutic Activities, and [phone] - Self Care    Check all conditions that are expected to impact treatment: Musculoskeletal disorders, Neurological condition, and Social determinants of health   If treatment provided at initial evaluation, no treatment charged due to lack of authorization.

## 2023-01-30 NOTE — Therapy (Signed)
OUTPATIENT OCCUPATIONAL THERAPY TREATMENT NOTE  Patient Name: Samuel [name] MRN: [id number] DOB:19-May-1961, 62 y.o., male Today's Date: 01/30/2023  PCP: Ricky Stabs, NP REFERRING PROVIDER: Jacquelynn Cree, PA-C   END OF SESSION:  OT End of Session - 01/30/23 0939     Visit Number 2    Number of Visits 7    Date for OT Re-Evaluation 02/24/23    Authorization Type [employer] - no auth required    OT Start Time 0940    OT Stop Time 1021    OT Time Calculation (min) 41 min    Activity Tolerance Patient tolerated treatment well;No increased pain;Patient limited by fatigue;Patient limited by lethargy    Behavior During Therapy [facility] for tasks assessed/performed              Past Medical History:  Diagnosis Date   Alcohol abuse    Cervical myelopathy    Coma    was in hospital for 3+ months in the 80's--carbon monoxide poisoning.   Elevated LFTs    Gallbladder polyp    Pancreatitis    Renal cyst, left    Past Surgical History:  Procedure Laterality Date   ANTERIOR CERVICAL DECOMP/DISCECTOMY FUSION N/A 12/16/2022   Procedure: CERVICAL THREE FOUR ANTERIOR CERVICAL DISCENTOMY AND FUSION;  Surgeon: London Sheer, MD;  Location: [facility] OR;  Service: Orthopedics;  Laterality: N/A;   NO PAST SURGERIES     Patient Active Problem List   Diagnosis Date Noted   Acute blood loss anemia 01/02/2023   Malnutrition of moderate degree 12/23/2022   Neurogenic bladder 12/22/2022   Cervical myelopathy 12/16/2022   Pancreatic stones    Alcohol-induced chronic pancreatitis    Abnormal LFTs    Pancreatic duct dilated 11/18/2018   Transaminitis 09/11/2018   HYPOKALEMIA 07/28/2010   TOBACCO ABUSE 07/28/2010   COCAINE ABUSE 07/28/2010   NEUROPATHY 07/28/2010   Hyponatremia 07/12/2010   Alcohol abuse 07/12/2010   Acute pancreatitis 07/12/2010    ONSET DATE: 12/16/22 DOS (C3-4 fusion)   REFERRING DIAG: G95.9 (ICD-10-CM) - Cervical myelopathy (HCC)   THERAPY DIAG:  Other lack of  coordination  Paresthesia of skin  Muscle weakness (generalized)  Rationale for Evaluation and Treatment: Rehabilitation  PERTINENT HISTORY: Now 5 weeks s/p cervical fusion C3-4.  Per in-patient rehab OT notes: "62 yo M admitted to [facility] on 3/8 for cervical myelopathy, s/p C3-4 ACDF. PMH: cocaine and ETOH abuse"  In the hospital he was weak b/l UEs L > R, and D/C at Min A for LB BADLs and Sup - Mod I for other BADLs.   Per OT eval: "He states before his neck fusion ~3 weeks ago, he couldn't feel his hands, feet, or have sensation to peritoneal areas. Now these are improving, as well as his functional endurance. He states he can stand unassisted for ~3 mins now, but usually uses rollator. He states he has to "plan ahead" to use the bathroom.  He uses access Zenaida Niece to arrive today. He got home from hospital last Tues."   PRECAUTIONS: Cervical (per MD, wear C-Collar and avoid neck ROM until MD f/u); WEIGHT BEARING RESTRICTIONS: Yes <5# now, or until cleared by MD    SUBJECTIVE:   SUBJECTIVE STATEMENT: He arrives after 3 weeks of not being seen and states he had diarrheas and is on meds for it. He states no pain now.  He admits to not understanding or knowing what surgery he had and what was the purpose.   PAIN:  Are you having pain? No but has numbness, tingling he rates "mild" today nad generally improving in hands, feet, peritoneal   FALLS: Has patient fallen in last 6 months? Yes. Number of falls 1-2 times (all before sx)   LIVING ENVIRONMENT: Lives with:  his roommate Stairs: No Has following equipment at home: Dan Humphreys - 2 wheeled, shower chair, and rollator, built up foam handles, therapy putty  PLOF: Requires assistive device for independence, Needs assistance with homemaking, and he states his roommate even helped him to the bathroom pre-sx. This had been "going on for a while"  PATIENT GOALS: To get better balance, functional endurance and use of his hands for basic home  tasks   OBJECTIVE: (All objective assessments below are from initial evaluation on: 01/09/23 unless otherwise specified.)   HAND DOMINANCE: Right   ADLs: Overall ADLs: overall he's functioning at distant supervision to Mod I level for basic ADLS, most difficulty due to c-collar, poor use of hands and poor balance/fnl endurance  Transfers/ambulation related to ADLs: supervision Eating: Mod I  Grooming: Mod I  UB Dressing: Mod I  LB Dressing: supervision at times for safety  Toileting: supervision at times for safety  Bathing: assist for back washing only  Tub Shower transfers: safety supervision only  Equipment: Transfer tub bench, Long handled sponge, and Feeding equipment  IADLs: Shopping: supervision  Light housekeeping: Supervision  Meal Prep: Supervision  Community mobility: Supervision  Medication management: Mod I  Financial management: Mod I   MOBILITY STATUS: Hx of falls, difficulty carrying objections with ambulation, and supervision to Mod I  ACTIVITY TOLERANCE: Activity tolerance: better since sx, but chronically impaired to <5 mins standing unsupported and <15 mins supported tasks   FUNCTIONAL OUTCOME MEASURES: Eval: Quck DASH 22% impairment today  (Higher % Score  =  More Impairment)     UPPER EXTREMITY ROM     Shoulder to Wrist AROM Right eval Left eval  Shoulder flexion 118 100  Shoulder abduction    Shoulder extension    Shoulder internal rotation    Shoulder external rotation    Elbow flexion Full Full  Elbow extension Full Full  Wrist flexion [facility] WFL  Wrist extension WFL WFL  (Blank rows = not tested)   Hand AROM Right eval Left eval  Full Fist Ability (or Gap to Distal Palmar Crease) Full Full  Thumb Opposition to Small Finger (or Gap) Full to SF Full to SF  (Blank rows = not tested)   UPPER EXTREMITY MMT:    Eval:  NT at eval due to recent and still healing injuries. Will be tested when appropriate, but grossly 3/5 MMT or 3-/5MMT in b/l  UEs.   MMT Right TBD Left TBD  Shoulder flexion    Shoulder abduction    Shoulder adduction    Shoulder extension    Shoulder internal rotation    Shoulder external rotation    Middle trapezius    Lower trapezius    Elbow flexion    Elbow extension    Forearm supination    Forearm pronation    Wrist flexion    Wrist extension    Wrist ulnar deviation    Wrist radial deviation    (Blank rows = not tested)  HAND FUNCTION: Eval: Observed weakness in affected hand.  Grip strength Right: 60 lbs, Left: 50 lbs  Tip Pinch: Rt:  7# Lt: 8# Key Rt: 8# Lt: 7#  COORDINATION: Eval: Observed coordination impairments with BOTH hands- noted slower motion  and decreased FMS. 9 Hole Peg Test Right: 1 min 13sec sec, Left: 21sec  SENSATION: Eval: Static 2-pt discrimination Rt: Median 10mm, ulnar 12mm; Lt: Median 9mm, Ulnar 7mm  OBSERVATIONS:   Eval: Seems like main issues are decreased fnl endurance, bil UE: FMS, ROM, strength, and decreased sensation.   TODAY'S TREATMENT:  01/30/23: For his self-care/safety, OT educated on the surgery that he had, the precautions, etc.  He had multiple questions about this but eventually stated understanding and thanked the therapist as this gives him a better idea of why he needs to avoid certain things at this time.  Next OT provides the following HEP printed out to help loosen stiffness gain strength in the arm and hand and work on paresthesia in the nerves.  He performs these back without any increased pain but feeling fatigue and increased tingling somewhat.  Lastly he was given in hand manipulation skills to work on his fine motor skills which he demonstrates back fairly well.  Exercises - Seated Scapular Retraction  - 4-6 x daily - 5-10 reps - Median Nerve Flossing - Tray  - 2 -3 x daily - 5-10 reps - Seated Shoulder Flexion Towel Slide at Table Top  - 4 x daily - 3-5 reps - 15 hold - Full Fist  - 2-3 x daily - 5 reps  In-Hand Manipulation  Skills Rotation:  Hold pen, try to "twirl" like a baton, keeping parallel (or flat) with surface of table. Try going BOTH directions 10x  Flip:  Hold pen in writing position,  flip in an arch to "erase" position, then back to "write" position. Do not lift hand off table.  10x  Translation:  Open hand palm up,  put an object (bigger is easier (fat marker), smaller is harder (penny)) in your palm and then use your fingers and thumb to move it to the tips of your fingers, pinched against your thumb.  10x  Shift:  Hold pen like a dart, start "shifting" it forward (like putting a key in a key hole) until you are holding it at the base, then shift it backwards until you are holding it at the tip again. 10x    PATIENT EDUCATION: Education details: See tx section above for details  Person educated: Patient Education method: Verbal Instruction Education comprehension: States and demonstrates understanding, Additional Education required   HOME EXERCISE PROGRAM: Access Code: [email or web address] URL: [email or web address] Date: 01/30/2023 Prepared by: Fannie Knee   GOALS: Goals reviewed with patient? Yes  SHORT TERM GOALS: Target date: 01/27/23  Pt will demo/state understanding of initial home exercise program (HEP) to improve function, pain, and independence.   Baseline: Needs a plan for rehabilitation  Goal status: INITIAL   LONG TERM GOALS: Target date: 02/24/23  Pt will improve functional ability by decreased impairment per Quick DASH assessment to 15% or better, for better quality of life. Baseline: 22% impaired  Goal status: INITIAL  2.  Pt will improve A/ROM in bil sh flex AROM to at least 130* each, to have prerequisite/functional motion for tasks like reach and grasp.  Baseline: <120* for both Goal status: INITIAL  3.  Pt will improve strength in bil UEs to at least 4/5 MMT to have increased functional ability to carry out selfcare and higher-level homecare tasks with no  difficulty Baseline: 3/5 MMT Goal status: INITIAL  4.  Pt will improve coordination skills in bil hands FMS, as seen by increasing score on 9HPT testing to at least <1 min, bilaterally  to have increased functional ability to carry out fine motor tasks (fasteners, etc.) and more complex, coordinated IADLs (meal prep, sports, etc.).  Baseline: >48min 10sec bil Goal status: INITIAL  5.  Pt will increase static 2-point discrimination ability in Rt hand to <57mm to have better occupational participation in daily roles. Baseline: 10mm Median, 12mm Ulnar Goal status: INITIAL  6.  Pt will improve grip strength in Lt hand to at least 60lbs for functional use at home and in IADLs. Baseline: 50 lbs Goal status: INITIAL  ASSESSMENT:  CLINICAL IMPRESSION: 01/30/23: His lack of awareness of his medical health and his surgery were detrimental to him and concerning.  He states having better awareness after today's session and he does have a home exercise program that is working to help him and not cause any further pain or problems.  Eval: Patient is a 62 y.o. male who was seen today for occupational therapy evaluation for difficulties with ADLs, functional endurance, balance, bilateral upper extremity range of motion and strength especially fine motor skills and sensation.  Some of these issues are more chronic, and he has some materials from inpatient rehab and has already been working on some of these things.  He will benefit from outpatient occupational therapy to increase the skills.    PLAN:  OT FREQUENCY: 1x/week  OT DURATION: 6 weeks  PLANNED INTERVENTIONS: self care/ADL training, therapeutic exercise, therapeutic activity, neuromuscular re-education, manual therapy, passive range of motion, compression bandaging, moist heat, cryotherapy, contrast bath, patient/family education, visual/perceptual remediation/compensation, energy conservation, coping strategies training, DME and/or AE  instructions, and Re-evaluation  RECOMMENDED OTHER SERVICES: is getting PT as well   CONSULTED AND AGREED WITH PLAN OF CARE: Patient  PLAN FOR NEXT SESSION:  Review HEP and add strengthening and arms if tolerated and if cleared by the physician.  He does not follow-up with this physician this week and may be able to start neck range of motion and lose the c-collar possibly.   Fannie Knee, OT 01/30/2023, 11:38 AM

## 2023-02-01 ENCOUNTER — Encounter: Payer: Medicaid Other | Admitting: Registered Nurse

## 2023-02-02 ENCOUNTER — Encounter: Payer: Medicaid Other | Attending: Registered Nurse | Admitting: Registered Nurse

## 2023-02-02 ENCOUNTER — Encounter: Payer: Self-pay | Admitting: Registered Nurse

## 2023-02-02 VITALS — BP 129/86 | HR 77 | Ht 65.0 in | Wt 95.8 lb

## 2023-02-02 DIAGNOSIS — G959 Disease of spinal cord, unspecified: Secondary | ICD-10-CM | POA: Diagnosis present

## 2023-02-02 NOTE — Progress Notes (Deleted)
Subjective:    Patient ID: Samuel [name], male    DOB: 07/14/1961, 62 y.o.   MRN: [id number]  HPI   Pain Inventory Average Pain {NUMBERS; 0-10:5044} Pain Right Now {NUMBERS; 0-10:5044} My pain is {PAIN DESCRIPTION:21022940}  In the last 24 hours, has pain interfered with the following? General activity {NUMBERS; 0-10:5044} Relation with others {NUMBERS; 0-10:5044} Enjoyment of life {NUMBERS; 0-10:5044} What TIME of day is your pain at its worst? {time of day:24191} Sleep (in general) {BHH GOOD/FAIR/POOR:22877}  Pain is worse with: {ACTIVITIES:21022942} Pain improves with: {PAIN IMPROVES WUJW:11914782} Relief from Meds: {NUMBERS; 0-10:5044}  Family History  Problem Relation Age of Onset   Breast cancer Mother    Pancreatitis Neg Hx    Liver disease Neg Hx    Colon cancer Neg Hx    Stomach cancer Neg Hx    Esophageal cancer Neg Hx    Social History   Socioeconomic History   Marital status: Divorced    Spouse name: Not on file   Number of children: 2   Years of education: Not on file   Highest education level: Not on file  Occupational History   Not on file  Tobacco Use   Smoking status: Every Day    Packs/day: 0.50    Years: 45.00    Additional pack years: 0.00    Total pack years: 22.50    Types: Cigarettes   Smokeless tobacco: Never  Vaping Use   Vaping Use: Some days  Substance and Sexual Activity   Alcohol use: Yes    Alcohol/week: 24.0 standard drinks of alcohol    Types: 24 Cans of beer per week    Comment: DAILY   Drug use: Not Currently   Sexual activity: Not on file  Other Topics Concern   Not on file  Social History Narrative   Not on file   Social Determinants of Health   Financial Resource Strain: Not on file  Food Insecurity: Not on file  Transportation Needs: Not on file  Physical Activity: Not on file  Stress: Not on file  Social Connections: Not on file   Past Surgical History:  Procedure Laterality Date   ANTERIOR CERVICAL  DECOMP/DISCECTOMY FUSION N/A 12/16/2022   Procedure: CERVICAL THREE FOUR ANTERIOR CERVICAL DISCENTOMY AND FUSION;  Surgeon: London Sheer, MD;  Location: [facility] OR;  Service: Orthopedics;  Laterality: N/A;   NO PAST SURGERIES     Past Surgical History:  Procedure Laterality Date   ANTERIOR CERVICAL DECOMP/DISCECTOMY FUSION N/A 12/16/2022   Procedure: CERVICAL THREE FOUR ANTERIOR CERVICAL DISCENTOMY AND FUSION;  Surgeon: London Sheer, MD;  Location: [facility] OR;  Service: Orthopedics;  Laterality: N/A;   NO PAST SURGERIES     Past Medical History:  Diagnosis Date   Alcohol abuse    Cervical myelopathy    Coma    was in hospital for 3+ months in the 80's--carbon monoxide poisoning.   Elevated LFTs    Gallbladder polyp    Pancreatitis    Renal cyst, left    There were no vitals taken for this visit.  Opioid Risk Score:   Fall Risk Score:  `1  Depression screen [facility] 2/9     01/17/2023    9:45 AM 03/03/2021   10:15 AM 01/19/2021   11:10 AM 10/05/2018    4:20 PM  Depression screen PHQ 2/9  Decreased Interest 0 3 2 0  Down, Depressed, Hopeless 0 3 1 0  PHQ - 2 Score 0 6  3 0  Altered sleeping 0 1 3 0  Tired, decreased energy Change in appetite 0 0 1 0  Feeling bad or failure about yourself  0 2 0 0  Trouble concentrating 0 0 1 0  Moving slowly or fidgety/restless 0 0 3 0  Suicidal thoughts 0 0 0 0  PHQ-9 Score Difficult doing work/chores  Very difficult Very difficult     Review of Systems     Objective:   Physical Exam        Assessment & Plan:

## 2023-02-02 NOTE — Progress Notes (Signed)
Subjective:    Patient ID: Samuel [name], [name]    DOB: [birth year], 62 y.o.   MRN: [id number]  HPI: Samuel [name] is a 63 y.o. [name] who is here for HFU appointment for  Cervical Myelopathy, with history of numbness and tingling in his bilateral hands   Dr. Christell Constant H&P Note 12/16/2022  Orthopedic Spine Surgery H&P Note   Assessment: Patient is a 62 y.o. [name] with cervical myelopathy     Plan: -Out of bed as tolerated, activity as tolerated, no brace -Covered the risks of surgery one more time with the patient and patient elected to proceed with planned surgery -Written consent verified -Hold anticoagulation in anticipation of surgery -Ancef on all to OR -NPO for procedure -Site marked -To OR when ready   The patient has signs and symptoms consistent with cervical myelopathy. The most common natural history of cervical myelopathy was explained to the patient. Given this course, operative management in the form of C3/4 anterior cervical discectomy and fusion was recommended to the patient. The risks, including but not limited to pseudarthrosis, dysphagia, hematoma, airway compromise, recurrent laryngeal nerve injury, esophageal perforation, durotomy, spinal cord injury, nerve root injury, persistent pain, adjacent segment disease, infection, bleeding, hardware failure, vascular injury, heart attack, death, stroke, fracture, and need for additional procedures were discussed with the patient. Discussed that his risk on infection and not healing his incision were higher given his low BMI. The benefit of surgery would be preventing progression of the myelopathy and not to reverse any myelopathic symptoms. The alternatives to surgical management would be continued monitoring, physical therapy, over-the-counter pain medications, ambulatory aids, and activity modification. I reiterated that these modalities would not change the natural history of the disease and that is why operative management has  been recommended. All the patient's questions were answered to their satisfaction. After this discussion, the patient expressed understanding and elected to proceed with surgical intervention.      ___________________________________________________________________________   Chief Complaint: decreased hand dexterity, difficulty ambulation, numbness in his hands   History: Patient is 62 y.o. [name] who has been previously seen in the office for cervical myelopathy. Given the natural history of myelopathy, operative management was recommended to the patient at our last visit. The patient presents today with no changes in his symptoms since the last office visit. See previous office note for further details.      DG Cervical Spine:  MPRESSION: Intraoperative fluoroscopy for C3-C4 ACDF.  He underwent: On 12/16/2022 by Dr Christell Constant  CERVICAL THREE FOUR ANTERIOR CERVICAL DISCENTOMY AND FUSION   He was admitted to inpatient rehabilitation on 03/14/20222 and discharged home on 01/03/2023. He had outpatient therapy at [facility]. He denies any pain. He rates his pain 0/ Also reports he has  a good appetite.     Pain Inventory Average Pain 0 Pain Right Now 0 My pain is  no pain  In the last 24 hours, has pain interfered with the following? General activity 0 Relation with others 0 Enjoyment of life 0 What TIME of day is your pain at its worst?  No pain Sleep (in general) Good  Pain is worse with:  no pain Pain improves with:  no pain Relief from Meds:  No pain  use a walker ability to climb steps?  yes do you drive?  no Do you have any goals in this area?  yes  retired I need assistance with the following:  bathing, household duties, and shopping Do you  have any goals in this area?  yes  bowel control problems  Any changes since last visit?  no  Any changes since last visit?  no    Family History  Problem Relation Age of Onset   Breast cancer Mother     Pancreatitis Neg Hx    Liver disease Neg Hx    Colon cancer Neg Hx    Stomach cancer Neg Hx    Esophageal cancer Neg Hx    Social History   Socioeconomic History   Marital status: Divorced    Spouse name: Not on file   Number of children: 2   Years of education: Not on file   Highest education level: Not on file  Occupational History   Not on file  Tobacco Use   Smoking status: Every Day    Packs/day: 0.50    Years: 45.00    Additional pack years: 0.00    Total pack years: 22.50    Types: Cigarettes   Smokeless tobacco: Never  Vaping Use   Vaping Use: Never used  Substance and Sexual Activity   Alcohol use: Yes    Alcohol/week: 24.0 standard drinks of alcohol    Types: 24 Cans of beer per week    Comment: DAILY   Drug use: Not Currently   Sexual activity: Not on file  Other Topics Concern   Not on file  Social History Narrative   Not on file   Social Determinants of Health   Financial Resource Strain: Not on file  Food Insecurity: Not on file  Transportation Needs: Not on file  Physical Activity: Not on file  Stress: Not on file  Social Connections: Not on file   Past Surgical History:  Procedure Laterality Date   ANTERIOR CERVICAL DECOMP/DISCECTOMY FUSION N/A 12/16/2022   Procedure: CERVICAL THREE FOUR ANTERIOR CERVICAL DISCENTOMY AND FUSION;  Surgeon: London Sheer, MD;  Location: [facility] OR;  Service: Orthopedics;  Laterality: N/A;   NO PAST SURGERIES     Past Medical History:  Diagnosis Date   Alcohol abuse    Cervical myelopathy    Coma    was in hospital for 3+ months in the 80's--carbon monoxide poisoning.   Elevated LFTs    Gallbladder polyp    Pancreatitis    Renal cyst, left    BP 129/86   Pulse 77   Ht 5\' 5"  (1.651 m)   Wt 95 lb 12.8 oz (43.5 kg)   SpO2 100%   BMI 15.94 kg/m   Opioid Risk Score:   Fall Risk Score:  `1  Depression screen [facility] 2/9     02/02/2023   10:24 AM 01/17/2023    9:45 AM 03/03/2021   10:15 AM 01/19/2021   11:10  AM 10/05/2018    4:20 PM  Depression screen PHQ 2/9  Decreased Interest 0 0 3 2 0  Down, Depressed, Hopeless 0 0 3 1 0  PHQ - 2 Score 0 0 6 3 0  Altered sleeping 1 0 1 3 0  Tired, decreased energy 0 1 2 3 1   Change in appetite 0 0 0 1 0  Feeling bad or failure about yourself  0 0 2 0 0  Trouble concentrating 0 0 0 1 0  Moving slowly or fidgety/restless 0 0 0 3 0  Suicidal thoughts 0 0 0 0 0  PHQ-9 Score 1 1 11 14 1   Difficult doing work/chores   Very difficult Very difficult     Review of  Systems  Gastrointestinal:  Positive for constipation.  All other systems reviewed and are negative.     Objective:   Physical Exam Vitals and nursing note reviewed.  Constitutional:      Appearance: Normal appearance.  Neck:     Comments: Wearing Cervical Collar Cardiovascular:     Rate and Rhythm: Normal rate and regular rhythm.     Pulses: Normal pulses.     Heart sounds: Normal heart sounds.  Pulmonary:     Effort: Pulmonary effort is normal.     Breath sounds: Normal breath sounds.  Musculoskeletal:     Cervical back: Normal range of motion and neck supple.     Comments: Normal Muscle Bulk and Muscle Testing Reveals:  Upper Extremities:Full  ROM and Muscle Strength 5/5  Lower Extremities: Full ROM and Muscle Strength 5/5 Arises from chair with ease Narrow Based  Gait     Skin:    General: Skin is warm and dry.  Neurological:     Mental Status: He is alert and oriented to person, place, and time.  Psychiatric:        Mood and Affect: Mood normal.        Behavior: Behavior normal.         Assessment & Plan:  Cervical Myelopathy: He underwent: on 12/16/2022: by Dr Christell Constant  CERVICAL THREE FOUR ANTERIOR CERVICAL DISCENTOMY AND FUSION   F/U with Dr Berline Chough in 4- 6 weeks

## 2023-02-06 ENCOUNTER — Encounter: Payer: Self-pay | Admitting: Orthopedic Surgery

## 2023-02-06 ENCOUNTER — Ambulatory Visit: Payer: Medicaid Other

## 2023-02-06 ENCOUNTER — Encounter: Payer: Self-pay | Admitting: Physical Therapy

## 2023-02-06 ENCOUNTER — Ambulatory Visit: Payer: Medicaid Other | Admitting: Physical Therapy

## 2023-02-06 ENCOUNTER — Ambulatory Visit (INDEPENDENT_AMBULATORY_CARE_PROVIDER_SITE_OTHER): Payer: Medicaid Other

## 2023-02-06 ENCOUNTER — Ambulatory Visit (INDEPENDENT_AMBULATORY_CARE_PROVIDER_SITE_OTHER): Payer: Medicaid Other | Admitting: Orthopedic Surgery

## 2023-02-06 VITALS — BP 118/79 | HR 70

## 2023-02-06 VITALS — BP 113/65 | HR 76 | Ht 65.0 in | Wt 95.0 lb

## 2023-02-06 DIAGNOSIS — R278 Other lack of coordination: Secondary | ICD-10-CM

## 2023-02-06 DIAGNOSIS — Z981 Arthrodesis status: Secondary | ICD-10-CM | POA: Diagnosis not present

## 2023-02-06 DIAGNOSIS — R269 Unspecified abnormalities of gait and mobility: Secondary | ICD-10-CM

## 2023-02-06 DIAGNOSIS — R2681 Unsteadiness on feet: Secondary | ICD-10-CM

## 2023-02-06 DIAGNOSIS — M6281 Muscle weakness (generalized): Secondary | ICD-10-CM

## 2023-02-06 DIAGNOSIS — R202 Paresthesia of skin: Secondary | ICD-10-CM

## 2023-02-06 NOTE — Therapy (Signed)
OUTPATIENT PHYSICAL THERAPY NEURO TREATMENT / GOALS ASSESSMENT   Patient Name: Samuel [name] MRN: [id number] DOB:Jan 05, 1961, 62 y.o., male Today's Date: 02/06/2023   PCP: Ricky Stabs, NP REFERRING PROVIDER: Jacquelynn Cree, PA-C  END OF SESSION:  PT End of Session - 02/06/23 1232     Visit Number 3    Number of Visits 9    Date for PT Re-Evaluation 03/20/23    Authorization Type [place] [employer]    Progress Note Due on Visit 10    PT Start Time 1230    PT Stop Time 1312    PT Time Calculation (min) 42 min    Equipment Utilized During Treatment Gait belt    Activity Tolerance Patient tolerated treatment well    Behavior During Therapy WFL for tasks assessed/performed             Past Medical History:  Diagnosis Date   Alcohol abuse    Cervical myelopathy (HCC)    Coma (HCC)    was in hospital for 3+ months in the 80's--carbon monoxide poisoning.   Elevated LFTs    Gallbladder polyp    Pancreatitis    Renal cyst, left    Past Surgical History:  Procedure Laterality Date   ANTERIOR CERVICAL DECOMP/DISCECTOMY FUSION N/A 12/16/2022   Procedure: CERVICAL THREE FOUR ANTERIOR CERVICAL DISCENTOMY AND FUSION;  Surgeon: London Sheer, MD;  Location: [facility] OR;  Service: Orthopedics;  Laterality: N/A;   NO PAST SURGERIES     Patient Active Problem List   Diagnosis Date Noted   Acute blood loss anemia 01/02/2023   Malnutrition of moderate degree 12/23/2022   Neurogenic bladder 12/22/2022   Cervical myelopathy (HCC) 12/16/2022   Pancreatic stones    Alcohol-induced chronic pancreatitis (HCC)    Abnormal LFTs    Pancreatic duct dilated 11/18/2018   Transaminitis 09/11/2018   HYPOKALEMIA 07/28/2010   TOBACCO ABUSE 07/28/2010   COCAINE ABUSE 07/28/2010   NEUROPATHY 07/28/2010   Hyponatremia 07/12/2010   Alcohol abuse 07/12/2010   Acute pancreatitis 07/12/2010    ONSET DATE: 01/02/2023 (referral date)  REFERRING DIAG: G95.9 (ICD-10-CM) - Cervical myelopathy  THERAPY  DIAG:  Other lack of coordination  Paresthesia of skin  Muscle weakness (generalized)  Abnormality of gait and mobility  Unsteadiness on feet  Rationale for Evaluation and Treatment: Rehabilitation  SUBJECTIVE:                                                                                                                                                                                             SUBJECTIVE STATEMENT: Patient arrives to session after visit with neurosurgeon.  Patient reports that he was cleared to have his cervical collar removed today and arrives to session without it.   Pt accompanied by: self  PERTINENT HISTORY: cervical myelopathy s/p ACDF C3/C4 on 12/16/22, chronic pancreatitis with diarrhea, GB polyps, abnormal LFTs, ETOH/Cocaine abuse in the  past (last (+) cocaine result 2012),  PAIN:  Are you having pain? No  PRECAUTIONS: Cervical, Fall, and Other: possible swallow with thickened liquids, (no lifting over 5lbs overhead)  - Cervical collar no longer required  WEIGHT BEARING RESTRICTIONS: No  FALLS: Has patient fallen in last 6 months? Yes. Number of falls 2 falls prior to surgery - catching foot on rug  PATIENT GOALS: "Be able to walk without a walker."  OBJECTIVE:   DIAGNOSTIC FINDINGS:   12/29/2022: IMPRESSION: Postoperative changes of anterior fusion C3-C4.  11/08/2022:  IMPRESSION: 1. Cervical spondylosis most pronounced at the C3-C4 level where there is severe canal stenosis with cord compression and severe bilateral foraminal stenosis. 2. Focal T2 hyperintense signal within the cord at the C3-C4 level where the cord is diminutive in caliber suggesting chronic myelomalacia. 3. Mild-to-moderate canal stenosis at C5-6 and mild canal stenosis at C4-5 and C6-7. 4. Moderate-severe left foraminal stenosis at C7-T1.   COGNITION: Overall cognitive status:  mild impairment with history but grossly WNL   TODAY'S TREATMENT:                                                                                                                                Vitals:   02/06/23 1236  BP: 118/79  Pulse: 70   NMR (functional balance):  TUG trials without AD fwd gait only (CGA) 23.2 sec 23.4 sec 30.17 sec (minA on one occasion)  TUG trials without AD fwd gait into backward gait (CGA) 33.10 sec 34.58 sec (minA on one occasion) 35.21 sec  TUG trials without AD with lateral gait (CGA) 38.99 sec 40.46 sec  43.17 sec  TUG trials without AD with fwd gait and cone weaving (x 5 cones) (CGA) 51.34 sec 50.86 sec  53.44 sec  Advised use of rollator at all times at home based on gait presentation during session without AD and Berg results; patient verbalized understanding  TherAct:  FUNCTIONAL TESTS:  5 times sit to stand: 15.09" without UE support (SBA) Timed up and go (TUG): 15" with rollator + CGA 10 meter walk test: 16" or 0.63 m/s with rollator + CGA   [facility] PT Assessment - 02/06/23 0001       Berg Balance Test   Sit to Stand Able to stand  independently using hands    Standing Unsupported Able to stand safely 2 minutes    Sitting with Back Unsupported but Feet Supported on Floor or Stool Able to sit safely and securely 2 minutes    Stand to Sit Controls descent by using hands    Transfers Able to transfer safely, definite need of hands    Standing Unsupported  with Eyes Closed Able to stand 10 seconds safely    Standing Unsupported with Feet Together Able to place feet together independently and stand for 1 minute with supervision    From Standing, Reach Forward with Outstretched Arm Can reach forward >12 cm safely (5")    From Standing Position, Pick up Object from Floor Able to pick up shoe, needs supervision    From Standing Position, Turn to Look Behind Over each Shoulder Needs assist to keep from losing balance and falling   scored 0 due to cervical precautions not advising at this time   Turn 360 Degrees Needs close  supervision or verbal cueing    Standing Unsupported, Alternately Place Feet on Step/Stool Able to complete >2 steps/needs minimal assist    Standing Unsupported, One Foot in Front Able to take small step independently and hold 30 seconds    Standing on One Leg Tries to lift leg/unable to hold 3 seconds but remains standing independently    Total Score 35           *Berg modified for cervical precautions  PATIENT EDUCATION: Education details: Education administrator, STG results Person educated: Patient Education method: Explanation, Demonstration, and Handouts Education comprehension: verbalized understanding and needs further education  HOME EXERCISE PROGRAM: Access Code: [email or web address] URL: [email or web address] Date: 01/30/2023 Prepared by: Maryruth Eve  Exercises - Sit to Stand  - 1 x daily - 7 x weekly - 3 sets - 10 reps  GOALS: Goals reviewed with patient? Yes  SHORT TERM GOALS: Target date: 02/06/2023  Patient will demonstrate 100% compliance with initial HEP to continue to progress between physical therapy sessions.     Baseline: Patient's reports exercise is going alright at home Goal status: MET  2. Patient will improve gait speed to 0.66 m/s with LRAD to progress towards PLOF and demonstrate progress towards a reduced risk for falls.  Baseline: 17.88" or 0.56 m/s with rollator + CGA; improved to 16.0" to 0.63 m/s with rollator Goal status: IN PROGRESS  3.  Patient will improve TUG score to 16 seconds or less with LRAD to indicate clinically significant progress towards a decreased risk of falls and improved mobility.  Baseline: 19.79" with rollator + CGA-SBA; improved to 15" with rollator + CGA Goal status: MET  4.  Patient will improve their 5x Sit to Stand score to less than 15 seconds to demonstrate a decreased risk for falls and improved LE strength.   Baseline: 16.9" without UE use, WBOS (SBA); improved 15.09" without UE support (SBA) Goal status: IN  PROGRESS  5.  Patient will improve Berg Balance score by 5 points to indicate clinically significant progress towards a decreased risk of falls and improved static stability.   Baseline: 26/56; improved to 35 Goal status: MET   LONG TERM GOALS: Target date: 03/06/2023  Patient will report demonstrate independence with final HEP in order to maintain current gains and continue to progress after physical therapy discharge.   Baseline: To be provided Goal status: INITIAL  2.  Patient will improve gait speed to 0.70 m/s or less with LRAD to progress towards PLOF and demonstrate progress towards a reduced risk for falls.  Baseline: 17.88" or 0.56 m/s with rollator + CGA; improved to 16.0" to 0.63 m/s with rollator Goal status: IN PROGRESS  3. Patient will improve TUG score to 15 seconds or less with LRAD to indicate clinically significant progress towards a decreased risk of falls and improved mobility.  Baseline: 19.79" with rollator + CGA-SBA;  improved to 15" with rollator + CGA Goal status: MET  4.  Patient will improve Berg Balance score by 10 points to indicate clinically significant progress towards a decreased risk of falls and improved static stability.   Baseline: 26/56; improved to 35/56 Goal status: IN PROGRESS   ASSESSMENT:  CLINICAL IMPRESSION: Session emphasized assessment of STGs in addition to trialing ambulation without AD as patient reports trialing independently at home. Patient progressing towards all goals and achieved 3/5 STGs and 1/4 LTGs. Patient still at an increased risk for falls as indciated by Sharlene Motts and based on trial without AD continued to recommend use off AD at all times presently given presentation during today's session. Continue POC.   OBJECTIVE IMPAIRMENTS: Abnormal gait, decreased activity tolerance, decreased balance, decreased coordination, decreased knowledge of use of DME, decreased mobility, difficulty walking, decreased ROM, decreased strength, and  decreased safety awareness.   ACTIVITY LIMITATIONS: carrying, bending, standing, squatting, and reach over head  PARTICIPATION LIMITATIONS: community activity, occupation, and yard work  PERSONAL FACTORS: Social background and 3+ comorbidities: see above  are also affecting patient's functional outcome.   REHAB POTENTIAL: Good  CLINICAL DECISION MAKING: Evolving/moderate complexity  EVALUATION COMPLEXITY: Moderate  PLAN:  PT FREQUENCY: 1x/week  PT DURATION: 8 weeks  PLANNED INTERVENTIONS: Therapeutic exercises, Therapeutic activity, Neuromuscular re-education, Balance training, Gait training, Patient/Family education, Self Care, DME instructions, Manual therapy, and Re-evaluation  PLAN FOR NEXT SESSION: add to initial HEP, rollator safety / training, cervical precautions, dynamic / static balance tasks with functional reaching, stepping to targets  Carmelia Bake, PT, DPT 02/06/2023, 1:32 PM  Check all possible CPT codes: 95638 - PT Re-evaluation, 97110- Therapeutic Exercise, [phone]- Neuro Re-education, [phone] - Gait Training, [phone] - Manual Therapy, 97530 - Therapeutic Activities, and [phone] - Self Care    Check all conditions that are expected to impact treatment: Musculoskeletal disorders, Neurological condition, and Social determinants of health   If treatment provided at initial evaluation, no treatment charged due to lack of authorization.

## 2023-02-06 NOTE — Patient Instructions (Signed)
Shoulder Shrug    Bring shoulders up toward ears. Hold 3lb weight each hand. Repeat __8__ times. __2_ sets per session. Do __3__ sessions per week.    Wrist Elbow Flexion: Resisted - Palm Up    With right arm straight, palm forward, holding __3__ pound weight, bend elbow. Return slowly. Repeat __8__ times per set. __2_ sets per session. Do _3___ sessions per week.  WRIST: Flexion (Weight)    Start with palm up, forearm resting on thigh. Holding weight, bend wrist to raise hand. Use _3__ lb weight. __8_ reps per set, _2__ sets per day, __3_ days per week   WRIST: Extension (Weight)    Start with palm down, forearm resting on thigh. Holding weight, bend wrist to raise hand. Use _3__ lb weight. __8_ reps per set, __2_ sets per day, __3_ days per week

## 2023-02-06 NOTE — Progress Notes (Signed)
Orthopedic Surgery Post-operative Progress Note   Assessment: Patient is a 62 y.o. male who is as noted improvement in his balance and hand function after C3/4 ACDF Date of surgery: 12/16/2022 (~6 weeks post-op)     Plan: -Out of bed as tolerated, discontinue aspen collar -No bending/lifting/twisting greater than 10 pounds -Okay to submerge wound -Pain control: OTC medications -Discussed smoking cessation today as he is still at risk for pseudoarthrosis -Follow up in office in approximately 6 weeks, x-rays at next visit: AP/lateral/flex/ex cervical   _________________________________________________________________________   Subjective: Patient is at home now.  He is continue to work with physical therapy on an outpatient basis.  He is not having any pain in his neck or pain radiating into either upper extremity.  He has noticed that his balance has improved.  He is now able to walk around the house without any assistive devices.  He is using a walker when out of the house.  He is now able to cook for himself and feels that his hand function has improved since surgery.  He still has decreased sensation in his fingers bilaterally.  No other numbness or paresthesias.  Denies bowel and bladder incontinence.  Objective:   General: no acute distress, appropriate affect Neurologic: alert, answering questions appropriately, following commands Respiratory: unlabored breathing on room air Skin: incision well healed with no surrounding erythema or induration.  There is no active or expressible drainage   MSK (spine):   -Strength exam                                                   Left                  Right Grip strength                4+/5                4+/5 Interosseus                  4+/5                4+/5 Wrist extension            5/5                  5/5 Wrist flexion                 5/5                  5/5 Elbow extension           5/5                  5/5 Elbow flexion                 5/5                  5/5 Deltoid                          5/5                  5/5   EHL  4/5                  4/5 TA                                 4+/5                4+/5 GSC                             4+/5                4+/5 Knee extension            5/5                  5/5 Knee flexion                 5/5                  5/5 Hip flexion                    5/5                  5/5   -Sensory exam                           Sensation intact to light touch in L3-S1 nerve distributions of bilateral lower extremities             Sensation intact to light touch in C5-T1 nerve distributions of bilateral upper extremities (decreased distal to the wrist bilaterally in all distributions)     Imaging: XRs from 02/06/2023 were reviewed and independently interpreted, showing allograft interbody cage in the C3/4 disc space near the anterior aspect.  It appears to be in the same position as the post-operative x-rays on 12/29/2022.  Anterior instrumentation in place at C3 and C4 with no lucency around the screws.  Screws have not backed out.  Plate in similar position on the AP view as films on 12/29/2022.    Patient name: Samuel [name] Patient MRN: [id number] Date: 02/06/23

## 2023-02-06 NOTE — Therapy (Signed)
OUTPATIENT OCCUPATIONAL THERAPY TREATMENT NOTE  Patient Name: Samuel [name] MRN: [id number] DOB:January 14, 1961, 62 y.o., male Today's Date: 02/06/2023  PCP: Ricky Stabs, NP REFERRING PROVIDER: Jacquelynn Cree, PA-C   END OF SESSION:  OT End of Session - 02/06/23 1312     Visit Number 3    Number of Visits 7    Date for OT Re-Evaluation 02/24/23    Authorization Type [employer] - no auth required    OT Start Time 1315    OT Stop Time 1356    OT Time Calculation (min) 41 min    Equipment Utilized During Treatment 3 lb dumbbell    Activity Tolerance Patient tolerated treatment well;No increased pain;Patient limited by fatigue;Patient limited by lethargy    Behavior During Therapy [facility] for tasks assessed/performed              Past Medical History:  Diagnosis Date   Alcohol abuse    Cervical myelopathy (HCC)    Coma (HCC)    was in hospital for 3+ months in the 80's--carbon monoxide poisoning.   Elevated LFTs    Gallbladder polyp    Pancreatitis    Renal cyst, left    Past Surgical History:  Procedure Laterality Date   ANTERIOR CERVICAL DECOMP/DISCECTOMY FUSION N/A 12/16/2022   Procedure: CERVICAL THREE FOUR ANTERIOR CERVICAL DISCENTOMY AND FUSION;  Surgeon: London Sheer, MD;  Location: [facility] OR;  Service: Orthopedics;  Laterality: N/A;   NO PAST SURGERIES     Patient Active Problem List   Diagnosis Date Noted   Acute blood loss anemia 01/02/2023   Malnutrition of moderate degree 12/23/2022   Neurogenic bladder 12/22/2022   Cervical myelopathy (HCC) 12/16/2022   Pancreatic stones    Alcohol-induced chronic pancreatitis (HCC)    Abnormal LFTs    Pancreatic duct dilated 11/18/2018   Transaminitis 09/11/2018   HYPOKALEMIA 07/28/2010   TOBACCO ABUSE 07/28/2010   COCAINE ABUSE 07/28/2010   NEUROPATHY 07/28/2010   Hyponatremia 07/12/2010   Alcohol abuse 07/12/2010   Acute pancreatitis 07/12/2010    ONSET DATE: 12/16/22 DOS (C3-4 fusion)   REFERRING DIAG: G95.9  (ICD-10-CM) - Cervical myelopathy (HCC)   THERAPY DIAG:  Unsteadiness on feet  Muscle weakness (generalized)  Other lack of coordination  Rationale for Evaluation and Treatment: Rehabilitation  PERTINENT HISTORY: Now 5 weeks s/p cervical fusion C3-4.  Per in-patient rehab OT notes: "62 yo M admitted to [facility] on 3/8 for cervical myelopathy, s/p C3-4 ACDF. PMH: cocaine and ETOH abuse"  In the hospital he was weak b/l UEs L > R, and D/C at Min A for LB BADLs and Sup - Mod I for other BADLs.   Per OT eval: "He states before his neck fusion ~3 weeks ago, he couldn't feel his hands, feet, or have sensation to peritoneal areas. Now these are improving, as well as his functional endurance. He states he can stand unassisted for ~3 mins now, but usually uses rollator. He states he has to "plan ahead" to use the bathroom.  He uses access Zenaida Niece to arrive today. He got home from hospital last Tues."   PRECAUTIONS: As of 02/06/23 per Dr. Kathi Der note: Plan: -Out of bed as tolerated, discontinue aspen collar -No bending/lifting/twisting greater than 10 pounds -Okay to submerge wound -Pain control: OTC medications -Discussed smoking cessation today as he is still at risk for pseudoarthrosis -Follow up in office in approximately 6 weeks, x-rays at next visit: AP/lateral/flex/ex cervical  WEIGHT BEARING RESTRICTIONS: Yes <10# now,  or until cleared by MD as of 02/06/23 per Dr. Kathi Der note.   SUBJECTIVE:   SUBJECTIVE STATEMENT: Pt went to see the orthopedic MD today and was provided with updated precautions/restrictions. Pt reports feeling good.   PAIN:  Are you having pain? No but has numbness, tingling he rates "mild" today in his hands.  FALLS: Has patient fallen in last 6 months? Yes. Number of falls 1-2 times (all before sx)   LIVING ENVIRONMENT: Lives with:  his roommate Stairs: No Has following equipment at home: Dan Humphreys - 2 wheeled, shower chair, and rollator, built up foam handles, therapy  putty  PLOF: Requires assistive device for independence, Needs assistance with homemaking, and he states his roommate even helped him to the bathroom pre-sx. This had been "going on for a while"  PATIENT GOALS: To get better balance, functional endurance and use of his hands for basic home tasks   OBJECTIVE: (All objective assessments below are from initial evaluation on: 01/09/23 unless otherwise specified.)   HAND DOMINANCE: Right   ADLs: Overall ADLs: overall he's functioning at distant supervision to Mod I level for basic ADLS, most difficulty due to c-collar, poor use of hands and poor balance/fnl endurance  Transfers/ambulation related to ADLs: supervision Eating: Mod I  Grooming: Mod I  UB Dressing: Mod I  LB Dressing: supervision at times for safety  Toileting: supervision at times for safety  Bathing: assist for back washing only  Tub Shower transfers: safety supervision only  Equipment: Transfer tub bench, Long handled sponge, and Feeding equipment  IADLs: Shopping: supervision  Light housekeeping: Supervision  Meal Prep: Supervision  Community mobility: Supervision  Medication management: Mod I  Financial management: Mod I   MOBILITY STATUS: Hx of falls, difficulty carrying objections with ambulation, and supervision to Mod I  ACTIVITY TOLERANCE: Activity tolerance: better since sx, but chronically impaired to <5 mins standing unsupported and <15 mins supported tasks   FUNCTIONAL OUTCOME MEASURES: Eval: Quck DASH 22% impairment today  (Higher % Score  =  More Impairment)     UPPER EXTREMITY ROM     Shoulder to Wrist AROM Right eval Left eval Right 02/06/23 Left 02/06/23  Shoulder flexion 118 100 145 135  Shoulder abduction      Shoulder extension      Shoulder internal rotation      Shoulder external rotation      Elbow flexion Full Full    Elbow extension Full Full    Wrist flexion [facility] WFL    Wrist extension WFL WFL    (Blank rows = not  tested)   Hand AROM Right eval Left eval  Full Fist Ability (or Gap to Distal Palmar Crease) Full Full  Thumb Opposition to Small Finger (or Gap) Full to SF Full to SF  (Blank rows = not tested)   UPPER EXTREMITY MMT:    Eval:  NT at eval due to recent and still healing injuries. Will be tested when appropriate, but grossly 3/5 MMT or 3-/5MMT in b/l UEs.   MMT Right TBD Left TBD  Shoulder flexion    Shoulder abduction    Shoulder adduction    Shoulder extension    Shoulder internal rotation    Shoulder external rotation    Middle trapezius    Lower trapezius    Elbow flexion    Elbow extension    Forearm supination    Forearm pronation    Wrist flexion    Wrist extension    Wrist ulnar  deviation    Wrist radial deviation    (Blank rows = not tested)  HAND FUNCTION: Eval: Observed weakness in affected hand.  Grip strength Right: 60 lbs, Left: 50 lbs  Tip Pinch: Rt:  7# Lt: 8# Key Rt: 8# Lt: 7#  COORDINATION: Eval: Observed coordination impairments with BOTH hands- noted slower motion and decreased FMS. 9 Hole Peg Test Right: 1 min 13sec sec, Left: 21sec  SENSATION: Eval: Static 2-pt discrimination Rt: Median 10mm, ulnar 12mm; Lt: Median 9mm, Ulnar 7mm  OBSERVATIONS:   Eval: Seems like main issues are decreased fnl endurance, bil UE: FMS, ROM, strength, and decreased sensation.  02/06/23: Pt ambulatory with rollator. Pt well-kept.   TODAY'S TREATMENT:  Self-care: Reviewed with pt's the ortho MD instructions for his precautions moving forward. Pt able to recall 1/3 precautions post education. Pt provided with acronym B-L-T to help recall. Pt recalled 3/3 precautions using acronym after 20 minutes. Pt describing picking up items from floor, bending forward to obtain items. Education provided for reacher to prevent bending, and where pt can purchase item. Pt able to demo some exercises from provided HEP from previous visit.  Therapeutic activity: At tabletop, pt  placing large pegs into peg board grasping with LUE and holding board with RUE. Pt completing 25 pegs then removed. Pt then repeated with right hand grasping pegs and left hand holding board. Pt requiring verbal cues for technique to improve independence in task.  Therapeutic exercise: Pt performing BUE HEP as indicated in pt. Instructions from today. Pt requiring verbal cues for correct form. Education provided for when to terminate task. Pt indicating weight is not too difficult, but just right challenge. Education also provided dumbbells to address grip strength as well based on pt's goals. Education provided pt can use small canned food items if unable to obtain dumbbells.    PATIENT EDUCATION: Education details: Current precautions as it relates to therapy, BUE HEP Person educated: Patient Education method: Engineer, structural, Demonstration, handout Education comprehension: States and demonstrates understanding, Additional Education required   HOME EXERCISE PROGRAM: Access Code: [email or web address] URL: [email or web address] Date: 01/30/2023 Prepared by: Fannie Knee  02/06/23: BUE HEP  GOALS: Goals reviewed with patient? Yes  SHORT TERM GOALS: Target date: 01/27/23  Pt will demo/state understanding of initial home exercise program (HEP) to improve function, pain, and independence.   Baseline: Needs a plan for rehabilitation  Goal status: INITIAL   LONG TERM GOALS: Target date: 02/24/23  Pt will improve functional ability by decreased impairment per Quick DASH assessment to 15% or better, for better quality of life. Baseline: 22% impaired  Goal status: INITIAL  2.  Pt will improve A/ROM in bil sh flex AROM to at least 130* each, to have prerequisite/functional motion for tasks like reach and grasp.  Baseline: <120* for both 02/06/23: Rt - 145, Lt - 135 Goal status: MET  3.  Pt will improve strength in bil UEs to at least 4/5 MMT to have increased functional ability to  carry out selfcare and higher-level homecare tasks with no difficulty Baseline: 3/5 MMT Goal status: INITIAL  4.  Pt will improve coordination skills in bil hands FMS, as seen by increasing score on 9HPT testing to at least <1 min, bilaterally to have increased functional ability to carry out fine motor tasks (fasteners, etc.) and more complex, coordinated IADLs (meal prep, sports, etc.).  Baseline: >4min 10sec bil Goal status: INITIAL  5.  Pt will increase static 2-point discrimination ability in Rt hand to <  10mm to have better occupational participation in daily roles. Baseline: 10mm Median, 12mm Ulnar Goal status: INITIAL  6.  Pt will improve grip strength in Lt hand to at least 60lbs for functional use at home and in IADLs. Baseline: 50 lbs Goal status: INITIAL  ASSESSMENT:  CLINICAL IMPRESSION: Pt presents for occupational therapy treatment visit today in good spirits and able to recall some of his updated precautions, and previous visit HEP. Pt is motivated and willing to work hard to improve. Pt continues to experience impairments in BUE strength and fine motor coordination impacting activities of daily living. Pt does show improvement today in bilateral shoulder AROM. Pt would continue to benefit from skilled OT services to address deficits, and to maximize independence in ADLs, IADLs, and leisure-related tasks.  PERFORMANCE DEFICITS: in functional skills including ADLs, IADLs, coordination, dexterity, sensation, ROM, strength, flexibility, Fine motor control, body mechanics, endurance, decreased knowledge of precautions, and UE functional use, cognitive skills including problem solving and safety awareness, and psychosocial skills including coping strategies, environmental adaptation, and habits.    IMPAIRMENTS: are limiting patient from ADLs, IADLs, and rest and sleep.    CO-MORBIDITIES: may have co-morbidities  that affects occupational performance. Patient will benefit from  skilled OT to address above impairments and improve overall function.   MODIFICATION OR ASSISTANCE TO COMPLETE EVALUATION: No modification of tasks or assist necessary to complete an evaluation.   OT OCCUPATIONAL PROFILE AND HISTORY: Problem focused assessment: Including review of records relating to presenting problem.   CLINICAL DECISION MAKING: LOW - limited treatment options, no task modification necessary   REHAB POTENTIAL: Excellent   EVALUATION COMPLEXITY: Low  PLAN:  OT FREQUENCY: 1x/week  OT DURATION: 6 weeks  PLANNED INTERVENTIONS: self care/ADL training, therapeutic exercise, therapeutic activity, neuromuscular re-education, manual therapy, passive range of motion, compression bandaging, moist heat, cryotherapy, contrast bath, patient/family education, visual/perceptual remediation/compensation, energy conservation, coping strategies training, DME and/or AE instructions, and Re-evaluation  RECOMMENDED OTHER SERVICES: is getting PT as well   CONSULTED AND AGREED WITH PLAN OF CARE: Patient  PLAN FOR NEXT SESSION:  Balance activities, fine motor coordination, grip strength, review BUE HEP   [employer], OT 02/06/2023, 1:55 PM

## 2023-02-13 ENCOUNTER — Ambulatory Visit: Payer: Medicaid Other | Attending: Family | Admitting: Physical Therapy

## 2023-02-13 ENCOUNTER — Encounter: Payer: Self-pay | Admitting: Physical Therapy

## 2023-02-13 ENCOUNTER — Ambulatory Visit: Payer: Medicaid Other | Admitting: Occupational Therapy

## 2023-02-13 VITALS — BP 131/72 | HR 64

## 2023-02-13 DIAGNOSIS — R269 Unspecified abnormalities of gait and mobility: Secondary | ICD-10-CM | POA: Diagnosis present

## 2023-02-13 DIAGNOSIS — M6281 Muscle weakness (generalized): Secondary | ICD-10-CM

## 2023-02-13 DIAGNOSIS — G959 Disease of spinal cord, unspecified: Secondary | ICD-10-CM | POA: Insufficient documentation

## 2023-02-13 DIAGNOSIS — R2681 Unsteadiness on feet: Secondary | ICD-10-CM | POA: Diagnosis present

## 2023-02-13 DIAGNOSIS — R278 Other lack of coordination: Secondary | ICD-10-CM | POA: Diagnosis present

## 2023-02-13 DIAGNOSIS — R202 Paresthesia of skin: Secondary | ICD-10-CM

## 2023-02-13 NOTE — Therapy (Signed)
OUTPATIENT PHYSICAL THERAPY NEURO TREATMENT   Patient Name: Samuel [name] MRN: [id number] DOB:Feb 14, 1961, 62 y.o., male Today's Date: 02/13/2023   PCP: Ricky Stabs, NP REFERRING PROVIDER: Jacquelynn Cree, PA-C  END OF SESSION:  PT End of Session - 02/13/23 1158     Visit Number 4    Number of Visits 9    Date for PT Re-Evaluation 03/20/23    Authorization Type [place] Medicaid    Progress Note Due on Visit 10    PT Start Time 1149    PT Stop Time 1236    PT Time Calculation (min) 47 min    Equipment Utilized During Treatment Gait belt    Activity Tolerance Patient tolerated treatment well    Behavior During Therapy WFL for tasks assessed/performed             Past Medical History:  Diagnosis Date   Alcohol abuse    Cervical myelopathy (HCC)    Coma (HCC)    was in hospital for 3+ months in the 80's--carbon monoxide poisoning.   Elevated LFTs    Gallbladder polyp    Pancreatitis    Renal cyst, left    Past Surgical History:  Procedure Laterality Date   ANTERIOR CERVICAL DECOMP/DISCECTOMY FUSION N/A 12/16/2022   Procedure: CERVICAL THREE FOUR ANTERIOR CERVICAL DISCENTOMY AND FUSION;  Surgeon: London Sheer, MD;  Location: [facility] OR;  Service: Orthopedics;  Laterality: N/A;   NO PAST SURGERIES     Patient Active Problem List   Diagnosis Date Noted   Acute blood loss anemia 01/02/2023   Malnutrition of moderate degree 12/23/2022   Neurogenic bladder 12/22/2022   Cervical myelopathy (HCC) 12/16/2022   Pancreatic stones    Alcohol-induced chronic pancreatitis (HCC)    Abnormal LFTs    Pancreatic duct dilated 11/18/2018   Transaminitis 09/11/2018   HYPOKALEMIA 07/28/2010   TOBACCO ABUSE 07/28/2010   COCAINE ABUSE 07/28/2010   NEUROPATHY 07/28/2010   Hyponatremia 07/12/2010   Alcohol abuse 07/12/2010   Acute pancreatitis 07/12/2010    ONSET DATE: 01/02/2023 (referral date)  REFERRING DIAG: G95.9 (ICD-10-CM) - Cervical myelopathy  THERAPY DIAG:  Other lack  of coordination  Paresthesia of skin  Muscle weakness (generalized)  Abnormality of gait and mobility  Unsteadiness on feet  Rationale for Evaluation and Treatment: Rehabilitation  SUBJECTIVE:                                                                                                                                                                                             SUBJECTIVE STATEMENT: Patient received in splint room from OT.  He states he  is dealing with some intermittent constipation that his doctor is aware of and recommended a medicine for that he is not interested in taking at this time.  He denies falls or near falls recently as he has been using his rollator pretty much full time.  He did have one instance where he walked to the refrigerator without an AD, but did not lose his balance.  He states he has no idea why he did this, he just didn't think about it.  Pt accompanied by: self-arrived via transportation per pt report  PERTINENT HISTORY: cervical myelopathy s/p ACDF C3/C4 on 12/16/22, chronic pancreatitis with diarrhea, GB polyps, abnormal LFTs, ETOH/Cocaine abuse in the  past (last (+) cocaine result 2012),  PAIN:  Are you having pain? No  PRECAUTIONS: Cervical, Fall, and Other: possible swallow with thickened liquids, (no lifting over 5lbs overhead)  - Cervical collar no longer required  WEIGHT BEARING RESTRICTIONS: No  FALLS: Has patient fallen in last 6 months? Yes. Number of falls 2 falls prior to surgery - catching foot on rug  PATIENT GOALS: "Be able to walk without a walker."  OBJECTIVE:   DIAGNOSTIC FINDINGS:   12/29/2022: IMPRESSION: Postoperative changes of anterior fusion C3-C4.  11/08/2022:  IMPRESSION: 1. Cervical spondylosis most pronounced at the C3-C4 level where there is severe canal stenosis with cord compression and severe bilateral foraminal stenosis. 2. Focal T2 hyperintense signal within the cord at the C3-C4 level where  the cord is diminutive in caliber suggesting chronic myelomalacia. 3. Mild-to-moderate canal stenosis at C5-6 and mild canal stenosis at C4-5 and C6-7. 4. Moderate-severe left foraminal stenosis at C7-T1.   COGNITION: Overall cognitive status:  mild impairment with history but grossly WNL   TODAY'S TREATMENT:                                                                                                                              -SciFit in multi-peaks mode for dynamic cardiovascular HIIT style warmup x8 minutes on level 4.0 using BUE/BLE.  Vitals:   02/13/23 1213  BP: 131/72  Pulse: 64   -Standing feet together eyes open x1 minute -Standing feet apart eyes closed 2x30 seconds w/ 15 seconds rest b/w sets -Lateral stepping at countertop 4x10', pt states his counter is not long enough at home so not added to HEP -SLS at countertop 2x30sec each LE w/ cues for form, edu on benefits of stability in SLS -Standing marches x20 w/ encouragement for increased amplitude of movement  -Pt ambulates to restroom SBA w/ therapist due to reported urgency following exercise performance.  PT stays with pt outside restroom until completed due to concern for fall risk.  Pt did not wish to have assistance in restroom.  PATIENT EDUCATION: Education details: Education administrator w/ approaching sitting (during approach to mat and SciFit-pt tends to leave rollator to the side).  Discussed using rollator full time for safety in home.  Additions to HEP and emphasis on  modification for safety-added to notes of HEP on specific exercises. Person educated: Patient Education method: Explanation, Demonstration, and Handouts Education comprehension: verbalized understanding and needs further education  HOME EXERCISE PROGRAM: Access Code: [email or web address] URL: [email or web address] Date: 01/30/2023 Prepared by: Maryruth Eve  Exercises - Sit to Stand  - 1 x daily - 7 x weekly - 3 sets - 10 reps - Corner  Balance Feet Together With Eyes Open  - 1 x daily - 5 x weekly - 1 sets - 2-3 reps - 30 seconds hold - Wide Stance with Eyes Closed  - 1 x daily - 5 x weekly - 1 sets - 2-3 reps - 30 seconds hold - Standing Single Leg Stance with Counter Support  - 1 x daily - 5 x weekly - 1 sets - 2-3 reps - 30 seconds hold - Standing March with Counter Support  - 1 x daily - 5 x weekly - 2 sets - 20 reps  GOALS: Goals reviewed with patient? Yes  SHORT TERM GOALS: Target date: 02/06/2023  Patient will demonstrate 100% compliance with initial HEP to continue to progress between physical therapy sessions.     Baseline: Patient's reports exercise is going alright at home Goal status: MET  2. Patient will improve gait speed to 0.66 m/s with LRAD to progress towards PLOF and demonstrate progress towards a reduced risk for falls.  Baseline: 17.88" or 0.56 m/s with rollator + CGA; improved to 16.0" to 0.63 m/s with rollator Goal status: IN PROGRESS  3.  Patient will improve TUG score to 16 seconds or less with LRAD to indicate clinically significant progress towards a decreased risk of falls and improved mobility.  Baseline: 19.79" with rollator + CGA-SBA; improved to 15" with rollator + CGA Goal status: MET  4.  Patient will improve their 5x Sit to Stand score to less than 15 seconds to demonstrate a decreased risk for falls and improved LE strength.   Baseline: 16.9" without UE use, WBOS (SBA); improved 15.09" without UE support (SBA) Goal status: IN PROGRESS  5.  Patient will improve Berg Balance score by 5 points to indicate clinically significant progress towards a decreased risk of falls and improved static stability.   Baseline: 26/56; improved to 35 Goal status: MET   LONG TERM GOALS: Target date: 03/06/2023  Patient will report demonstrate independence with final HEP in order to maintain current gains and continue to progress after physical therapy discharge.   Baseline: To be provided Goal  status: INITIAL  2.  Patient will improve gait speed to 0.70 m/s or less with LRAD to progress towards PLOF and demonstrate progress towards a reduced risk for falls.  Baseline: 17.88" or 0.56 m/s with rollator + CGA; improved to 16.0" to 0.63 m/s with rollator Goal status: IN PROGRESS  3. Patient will improve TUG score to 15 seconds or less with LRAD to indicate clinically significant progress towards a decreased risk of falls and improved mobility.  Baseline: 19.79" with rollator + CGA-SBA; improved to 15" with rollator + CGA Goal status: MET  4.  Patient will improve Berg Balance score by 10 points to indicate clinically significant progress towards a decreased risk of falls and improved static stability.   Baseline: 26/56; improved to 35/56 Goal status: IN PROGRESS   ASSESSMENT:  CLINICAL IMPRESSION: Patient is moderately fatigued with all activities in session today.  Focus of skilled PT session on making safe additions to HEP to address ongoing imbalance and functional strength.  Pt would  greatly benefit from having a caregiver present during sessions for improved safety and carryover, but it seems that is not possible for pt based on current living situation and reliance on transportation.  He remains an elevated fall risk due to deficits and continues to benefit from skilled PT to improve quality of upright mobility and safe use of LRAD (likely rollator).  OBJECTIVE IMPAIRMENTS: Abnormal gait, decreased activity tolerance, decreased balance, decreased coordination, decreased knowledge of use of DME, decreased mobility, difficulty walking, decreased ROM, decreased strength, and decreased safety awareness.   ACTIVITY LIMITATIONS: carrying, bending, standing, squatting, and reach over head  PARTICIPATION LIMITATIONS: community activity, occupation, and yard work  PERSONAL FACTORS: Social background and 3+ comorbidities: see above  are also affecting patient's functional outcome.    REHAB POTENTIAL: Good  CLINICAL DECISION MAKING: Evolving/moderate complexity  EVALUATION COMPLEXITY: Moderate  PLAN:  PT FREQUENCY: 1x/week  PT DURATION: 8 weeks  PLANNED INTERVENTIONS: Therapeutic exercises, Therapeutic activity, Neuromuscular re-education, Balance training, Gait training, Patient/Family education, Self Care, DME instructions, Manual therapy, and Re-evaluation  PLAN FOR NEXT SESSION: modify HEP prn, rollator safety / training, cervical precautions, dynamic / static balance tasks with functional reaching, stepping to targets  Sadie Haber, PT, DPT 02/13/2023, 12:37 PM  Check all possible CPT codes: 16109 - PT Re-evaluation, 97110- Therapeutic Exercise, [phone]- Neuro Re-education, [phone] - Gait Training, [phone] - Manual Therapy, 97530 - Therapeutic Activities, and [phone] - Self Care    Check all conditions that are expected to impact treatment: Musculoskeletal disorders, Neurological condition, and Social determinants of health   If treatment provided at initial evaluation, no treatment charged due to lack of authorization.

## 2023-02-13 NOTE — Patient Instructions (Signed)
Access Code: [email or web address] URL: [email or web address] Date: 02/13/2023 Prepared by: Camille Bal  Exercises - Sit to Stand  - 1 x daily - 7 x weekly - 3 sets - 10 reps - Corner Balance Feet Together With Eyes Open  - 1 x daily - 5 x weekly - 1 sets - 2-3 reps - 30 seconds hold - Wide Stance with Eyes Closed  - 1 x daily - 5 x weekly - 1 sets - 2-3 reps - 30 seconds hold - Standing Single Leg Stance with Counter Support  - 1 x daily - 5 x weekly - 1 sets - 2-3 reps - 30 seconds hold - Standing March with Counter Support  - 1 x daily - 5 x weekly - 2 sets - 20 reps

## 2023-02-13 NOTE — Therapy (Signed)
OUTPATIENT OCCUPATIONAL THERAPY TREATMENT NOTE  Patient Name: Samuel [name] MRN: [id number] DOB:04-24-1961, 62 y.o., male Today's Date: 02/13/2023  PCP: Ricky Stabs, NP REFERRING PROVIDER: Jacquelynn Cree, PA-C   END OF SESSION:  OT End of Session - 02/13/23 1108     Visit Number 4    Number of Visits 7    Date for OT Re-Evaluation 02/24/23    Authorization Type [employer] - no auth required    OT Start Time 1105    OT Stop Time 1145    OT Time Calculation (min) 40 min    Activity Tolerance Patient tolerated treatment well;No increased pain;Patient limited by fatigue;Patient limited by lethargy    Behavior During Therapy [facility] for tasks assessed/performed             Past Medical History:  Diagnosis Date   Alcohol abuse    Cervical myelopathy (HCC)    Coma (HCC)    was in hospital for 3+ months in the 80's--carbon monoxide poisoning.   Elevated LFTs    Gallbladder polyp    Pancreatitis    Renal cyst, left    Past Surgical History:  Procedure Laterality Date   ANTERIOR CERVICAL DECOMP/DISCECTOMY FUSION N/A 12/16/2022   Procedure: CERVICAL THREE FOUR ANTERIOR CERVICAL DISCENTOMY AND FUSION;  Surgeon: London Sheer, MD;  Location: [facility] OR;  Service: Orthopedics;  Laterality: N/A;   NO PAST SURGERIES     Patient Active Problem List   Diagnosis Date Noted   Acute blood loss anemia 01/02/2023   Malnutrition of moderate degree 12/23/2022   Neurogenic bladder 12/22/2022   Cervical myelopathy (HCC) 12/16/2022   Pancreatic stones    Alcohol-induced chronic pancreatitis (HCC)    Abnormal LFTs    Pancreatic duct dilated 11/18/2018   Transaminitis 09/11/2018   HYPOKALEMIA 07/28/2010   TOBACCO ABUSE 07/28/2010   COCAINE ABUSE 07/28/2010   NEUROPATHY 07/28/2010   Hyponatremia 07/12/2010   Alcohol abuse 07/12/2010   Acute pancreatitis 07/12/2010    ONSET DATE: 12/16/22 DOS (C3-4 fusion)   REFERRING DIAG: G95.9 (ICD-10-CM) - Cervical myelopathy (HCC)   THERAPY  DIAG:  Other lack of coordination  Paresthesia of skin  Muscle weakness (generalized)  Abnormality of gait and mobility  Rationale for Evaluation and Treatment: Rehabilitation  PERTINENT HISTORY: Now 5 weeks s/p cervical fusion C3-4.  Per in-patient rehab OT notes: "62 yo M admitted to [facility] on 3/8 for cervical myelopathy, s/p C3-4 ACDF. PMH: cocaine and ETOH abuse"  In the hospital he was weak b/l UEs L > R, and D/C at Min A for LB BADLs and Sup - Mod I for other BADLs.   Per OT eval: "He states before his neck fusion ~3 weeks ago, he couldn't feel his hands, feet, or have sensation to peritoneal areas. Now these are improving, as well as his functional endurance. He states he can stand unassisted for ~3 mins now, but usually uses rollator. He states he has to "plan ahead" to use the bathroom.  He uses access Zenaida Niece to arrive today. He got home from hospital last Tues."   PRECAUTIONS: As of 02/06/23 per Dr. Kathi Der note: Plan: -Out of bed as tolerated, discontinue aspen collar -No bending/lifting/twisting greater than 10 pounds -Okay to submerge wound -Pain control: OTC medications -Discussed smoking cessation today as he is still at risk for pseudoarthrosis -Follow up in office in approximately 6 weeks, x-rays at next visit: AP/lateral/flex/ex cervical  WEIGHT BEARING RESTRICTIONS: Yes <10# now, or until cleared by MD  as of 02/06/23 per Dr. Kathi Der note.  SUBJECTIVE:   SUBJECTIVE STATEMENT: He has been doing his shoulder and arm exercises. Pt states he made chicken, hamburger, cabbage, sweet potatoes, and dressing.   PAIN:  Are you having pain? No but has numbness, tingling he rates "mild" today in his hands.  FALLS: Has patient fallen in last 6 months? Yes. Number of falls 1-2 times (all before sx)   LIVING ENVIRONMENT: Lives with:  his roommate Stairs: No Has following equipment at home: Dan Humphreys - 2 wheeled, shower chair, and rollator, built up foam handles, therapy putty  PLOF:  Requires assistive device for independence, Needs assistance with homemaking, and he states his roommate even helped him to the bathroom pre-sx. This had been "going on for a while"  PATIENT GOALS: To get better balance, functional endurance and use of his hands for basic home tasks   OBJECTIVE: (All objective assessments below are from initial evaluation on: 01/09/23 unless otherwise specified.)   HAND DOMINANCE: Right   ADLs: Overall ADLs: overall he's functioning at distant supervision to Mod I level for basic ADLS, most difficulty due to c-collar, poor use of hands and poor balance/fnl endurance  Transfers/ambulation related to ADLs: supervision Eating: Mod I  Grooming: Mod I  UB Dressing: Mod I  LB Dressing: supervision at times for safety  Toileting: supervision at times for safety  Bathing: assist for back washing only  Tub Shower transfers: safety supervision only  Equipment: Transfer tub bench, Long handled sponge, and Feeding equipment  IADLs: Shopping: supervision  Light housekeeping: Supervision  Meal Prep: Supervision  Community mobility: Supervision  Medication management: Mod I  Financial management: Mod I   MOBILITY STATUS: Hx of falls, difficulty carrying objections with ambulation, and supervision to Mod I  ACTIVITY TOLERANCE: Activity tolerance: better since sx, but chronically impaired to <5 mins standing unsupported and <15 mins supported tasks   FUNCTIONAL OUTCOME MEASURES: Eval: Quck DASH 22% impairment today  (Higher % Score  =  More Impairment)     UPPER EXTREMITY ROM     Shoulder to Wrist AROM Right eval Left eval Right 02/06/23 Left 02/06/23  Shoulder flexion 118 100 145 135  Shoulder abduction      Shoulder extension      Shoulder internal rotation      Shoulder external rotation      Elbow flexion Full Full    Elbow extension Full Full    Wrist flexion [facility] WFL    Wrist extension WFL WFL    (Blank rows = not tested)   Hand AROM  Right eval Left eval  Full Fist Ability (or Gap to Distal Palmar Crease) Full Full  Thumb Opposition to Small Finger (or Gap) Full to SF Full to SF  (Blank rows = not tested)   UPPER EXTREMITY MMT:    Eval:  NT at eval due to recent and still healing injuries. Will be tested when appropriate, but grossly 3/5 MMT or 3-/5MMT in b/l UEs.   MMT Right TBD Left TBD  Shoulder flexion    Shoulder abduction    Shoulder adduction    Shoulder extension    Shoulder internal rotation    Shoulder external rotation    Middle trapezius    Lower trapezius    Elbow flexion    Elbow extension    Forearm supination    Forearm pronation    Wrist flexion    Wrist extension    Wrist ulnar deviation  Wrist radial deviation    (Blank rows = not tested)  HAND FUNCTION: Eval: Observed weakness in affected hand.  Grip strength Right: 60 lbs, Left: 50 lbs  Tip Pinch: Rt:  7# Lt: 8# Key Rt: 8# Lt: 7#  COORDINATION: Eval: Observed coordination impairments with BOTH hands- noted slower motion and decreased FMS. 9 Hole Peg Test Right: 1 min 13sec sec, Left: 21sec  SENSATION: Eval: Static 2-pt discrimination Rt: Median 10mm, ulnar 12mm; Lt: Median 9mm, Ulnar 7mm  OBSERVATIONS:   Eval: Seems like main issues are decreased fnl endurance, bil UE: FMS, ROM, strength, and decreased sensation.  02/06/23: Pt ambulatory with rollator. Pt well-kept.  02/13/2023: Pt requiring cues not to reach to floor due to precautions.   TODAY'S TREATMENT:  - Therapeutic exercises completed for duration as noted below including:  OT initiated B green theraputty exercises (search, grip, pinch, and key grip) as noted in patient instructions for coordination and strength OT initiated BUE coordination HOME EXERCISE PROGRAM as noted in patient instructions including pick up and placement of items, shuffling and turning cards, item stacking and unstacking. Unable to finish HEP due to time.   PATIENT  EDUCATION: Education details: Current precautions as it relates to therapy, BUE HEP Person educated: Patient Education method: Engineer, structural, Demonstration, handout Education comprehension: States and demonstrates understanding, Additional Education required   HOME EXERCISE PROGRAM: Access Code: [email or web address] URL: [email or web address] Date: 01/30/2023 Prepared by: Fannie Knee  02/06/23: BUE HEP 02/13/2023 - B green putty and coordination HEPs  GOALS: Goals reviewed with patient? Yes  SHORT TERM GOALS: Target date: 01/27/23  Pt will demo/state understanding of initial home exercise program (HEP) to improve function, pain, and independence.   Baseline: Needs a plan for rehabilitation  Goal status: INITIAL   LONG TERM GOALS: Target date: 02/24/23  Pt will improve functional ability by decreased impairment per Quick DASH assessment to 15% or better, for better quality of life. Baseline: 22% impaired  Goal status: INITIAL  2.  Pt will improve A/ROM in bil sh flex AROM to at least 130* each, to have prerequisite/functional motion for tasks like reach and grasp.  Baseline: <120* for both 02/06/23: Rt - 145, Lt - 135 Goal status: MET  3.  Pt will improve strength in bil UEs to at least 4/5 MMT to have increased functional ability to carry out selfcare and higher-level homecare tasks with no difficulty Baseline: 3/5 MMT Goal status: INITIAL  4.  Pt will improve coordination skills in bil hands FMS, as seen by increasing score on 9HPT testing to at least <1 min, bilaterally to have increased functional ability to carry out fine motor tasks (fasteners, etc.) and more complex, coordinated IADLs (meal prep, sports, etc.).  Baseline: >11min 10sec bil Goal status: INITIAL  5.  Pt will increase static 2-point discrimination ability in Rt hand to <29mm to have better occupational participation in daily roles. Baseline: 10mm Median, 12mm Ulnar Goal status: INITIAL  6.  Pt  will improve grip strength in Lt hand to at least 60lbs for functional use at home and in IADLs. Baseline: 50 lbs Goal status: INITIAL  ASSESSMENT:  CLINICAL IMPRESSION: Pt requiring cues to adhere to precautions and for proper HEP completion. He is willing to participate in therapy as needed in efforts to progress towards goals.   PERFORMANCE DEFICITS: in functional skills including ADLs, IADLs, coordination, dexterity, sensation, ROM, strength, flexibility, Fine motor control, body mechanics, endurance, decreased knowledge of precautions, and UE functional use, cognitive skills  including problem solving and safety awareness, and psychosocial skills including coping strategies, environmental adaptation, and habits.    IMPAIRMENTS: are limiting patient from ADLs, IADLs, and rest and sleep.    CO-MORBIDITIES: may have co-morbidities  that affects occupational performance. Patient will benefit from skilled OT to address above impairments and improve overall function.   MODIFICATION OR ASSISTANCE TO COMPLETE EVALUATION: No modification of tasks or assist necessary to complete an evaluation.   OT OCCUPATIONAL PROFILE AND HISTORY: Problem focused assessment: Including review of records relating to presenting problem.   CLINICAL DECISION MAKING: LOW - limited treatment options, no task modification necessary   REHAB POTENTIAL: Excellent   EVALUATION COMPLEXITY: Low  PLAN:  OT FREQUENCY: 1x/week  OT DURATION: 6 weeks  PLANNED INTERVENTIONS: self care/ADL training, therapeutic exercise, therapeutic activity, neuromuscular re-education, manual therapy, passive range of motion, compression bandaging, moist heat, cryotherapy, contrast bath, patient/family education, visual/perceptual remediation/compensation, energy conservation, coping strategies training, DME and/or AE instructions, and Re-evaluation  RECOMMENDED OTHER SERVICES: is getting PT as well   CONSULTED AND AGREED WITH PLAN OF  CARE: Patient  PLAN FOR NEXT SESSION:  Continue with coordination HEP starting with number 4. Balance activities, fine motor coordination, grip strength, review BUE coordination and green putty HEPs  Delana Meyer, OT 02/13/2023, 11:38 AM

## 2023-02-13 NOTE — Patient Instructions (Addendum)
   Putty Exercises All exercises to be completed TWICE daily Comments **Putty has a tendency to spread out to fit its environment. Please keep putty contained when not in use, out of the heat, out of reach of children and animals, and away from clothing/fabric as it may stick.** SEARCH Locate and pinch each individual item out of the putty. These can be COINS, BUTTONS, MARBLES, ETC. By holding putty in opposite hand, use affected hand to search and remove items. You can also do this with putty on table (as pictured). Repeat until all items have been removed from the putty.   USE:  3 items for 1 hand  OR 1-2 items for each han    GRIP Hold the putty in the palm of your hand. Now grip the putty bending your fingers into the putty as far as you can until they quit moving. REPEAT:  10 times HOLD: until fingers quit moving or about 5 seconds    PINCH Roll up some putty to create a small tubular section. Next, pinch the putty and repeat down the section.   i.e. thumb to index finger, thumb to middle finger, thumb to ring finger, and thumb to pinky finger.        REPEAT:  5 times each finger HOLD:  at least 3 seconds   KEY GRIP Hold the putty at the top of your hand. Squeeze the putty between your thumb and the side of your 2nd finger as shown.    -Think of holding a JEOPRADY buzzer in your hand  -Aim for the second knuckle of your second finger.  REPEAT:  10 times HOLD:  at least 3 seconds

## 2023-02-20 ENCOUNTER — Ambulatory Visit: Payer: Medicaid Other | Admitting: Occupational Therapy

## 2023-02-20 ENCOUNTER — Ambulatory Visit: Payer: Medicaid Other | Admitting: Physical Therapy

## 2023-02-20 VITALS — BP 112/67 | HR 82

## 2023-02-20 DIAGNOSIS — R278 Other lack of coordination: Secondary | ICD-10-CM | POA: Diagnosis not present

## 2023-02-20 DIAGNOSIS — G959 Disease of spinal cord, unspecified: Secondary | ICD-10-CM

## 2023-02-20 DIAGNOSIS — M6281 Muscle weakness (generalized): Secondary | ICD-10-CM

## 2023-02-20 DIAGNOSIS — R2681 Unsteadiness on feet: Secondary | ICD-10-CM

## 2023-02-20 DIAGNOSIS — R269 Unspecified abnormalities of gait and mobility: Secondary | ICD-10-CM

## 2023-02-20 NOTE — Therapy (Signed)
OUTPATIENT OCCUPATIONAL THERAPY TREATMENT NOTE  Patient Name: Samuel [name] MRN: [id number] DOB:[birth year], 62 y.o., male Today's Date: 02/20/2023  PCP: Ricky Stabs, NP REFERRING PROVIDER: Jacquelynn Cree, PA-C   END OF SESSION:  OT End of Session - 02/20/23 1105     Visit Number 5    Number of Visits 7    Date for OT Re-Evaluation 02/24/23    Authorization Type [employer] - no auth required    OT Start Time 1105    OT Stop Time 1145    OT Time Calculation (min) 40 min    Activity Tolerance Patient tolerated treatment well;No increased pain;Patient limited by fatigue;Patient limited by lethargy    Behavior During Therapy [facility] for tasks assessed/performed             Past Medical History:  Diagnosis Date   Alcohol abuse    Cervical myelopathy (HCC)    Coma (HCC)    was in hospital for 3+ months in the 80's--carbon monoxide poisoning.   Elevated LFTs    Gallbladder polyp    Pancreatitis    Renal cyst, left    Past Surgical History:  Procedure Laterality Date   ANTERIOR CERVICAL DECOMP/DISCECTOMY FUSION N/A 12/16/2022   Procedure: CERVICAL THREE FOUR ANTERIOR CERVICAL DISCENTOMY AND FUSION;  Surgeon: London Sheer, MD;  Location: [facility] OR;  Service: Orthopedics;  Laterality: N/A;   NO PAST SURGERIES     Patient Active Problem List   Diagnosis Date Noted   Acute blood loss anemia 01/02/2023   Malnutrition of moderate degree 12/23/2022   Neurogenic bladder 12/22/2022   Cervical myelopathy (HCC) 12/16/2022   Pancreatic stones    Alcohol-induced chronic pancreatitis (HCC)    Abnormal LFTs    Pancreatic duct dilated 11/18/2018   Transaminitis 09/11/2018   HYPOKALEMIA 07/28/2010   TOBACCO ABUSE 07/28/2010   COCAINE ABUSE 07/28/2010   NEUROPATHY 07/28/2010   Hyponatremia 07/12/2010   Alcohol abuse 07/12/2010   Acute pancreatitis 07/12/2010    ONSET DATE: 12/16/22 DOS (C3-4 fusion)   REFERRING DIAG: G95.9 (ICD-10-CM) - Cervical myelopathy (HCC)   THERAPY  DIAG:  Other lack of coordination  Muscle weakness (generalized)  Unsteadiness on feet  Rationale for Evaluation and Treatment: Rehabilitation  PERTINENT HISTORY: Now 5 weeks s/p cervical fusion C3-4.  Per in-patient rehab OT notes: "62 yo M admitted to [facility] on 3/8 for cervical myelopathy, s/p C3-4 ACDF. PMH: cocaine and ETOH abuse"  In the hospital he was weak b/l UEs L > R, and D/C at Min A for LB BADLs and Sup - Mod I for other BADLs.   Per OT eval: "He states before his neck fusion ~3 weeks ago, he couldn't feel his hands, feet, or have sensation to peritoneal areas. Now these are improving, as well as his functional endurance. He states he can stand unassisted for ~3 mins now, but usually uses rollator. He states he has to "plan ahead" to use the bathroom.  He uses access Zenaida Niece to arrive today. He got home from hospital last Tues."   PRECAUTIONS: As of 02/06/23 per Dr. Kathi Der note: Plan: -Out of bed as tolerated, discontinue aspen collar -No bending/lifting/twisting greater than 10 pounds -Okay to submerge wound -Pain control: OTC medications -Discussed smoking cessation today as he is still at risk for pseudoarthrosis -Follow up in office in approximately 6 weeks, x-rays at next visit: AP/lateral/flex/ex cervical  WEIGHT BEARING RESTRICTIONS: Yes <10# now, or until cleared by MD as of 02/06/23 per Dr. Kathi Der  note.  SUBJECTIVE: No pain and no recent falls  SUBJECTIVE STATEMENT: He has been doing his shoulder and arm exercises. Pt states he made chicken, hamburger, cabbage, sweet potatoes, and dressing.   PAIN:  Are you having pain? No but has numbness, tingling he rates "mild" today in his hands.  FALLS: Has patient fallen in last 6 months? Yes. Number of falls 1-2 times (all before sx)   LIVING ENVIRONMENT: Lives with:  his roommate Stairs: No Has following equipment at home: Dan Humphreys - 2 wheeled, shower chair, and rollator, built up foam handles, therapy putty  PLOF: Requires  assistive device for independence, Needs assistance with homemaking, and he states his roommate even helped him to the bathroom pre-sx. This had been "going on for a while"  PATIENT GOALS: To get better balance, functional endurance and use of his hands for basic home tasks   OBJECTIVE: (All objective assessments below are from initial evaluation on: 01/09/23 unless otherwise specified.)   HAND DOMINANCE: Right   ADLs: Overall ADLs: overall he's functioning at distant supervision to Mod I level for basic ADLS, most difficulty due to c-collar, poor use of hands and poor balance/fnl endurance  Transfers/ambulation related to ADLs: supervision Eating: Mod I  Grooming: Mod I  UB Dressing: Mod I  LB Dressing: supervision at times for safety  Toileting: supervision at times for safety  Bathing: assist for back washing only  Tub Shower transfers: safety supervision only  Equipment: Transfer tub bench, Long handled sponge, and Feeding equipment  IADLs: Shopping: supervision  Light housekeeping: Supervision  Meal Prep: Supervision  Community mobility: Supervision  Medication management: Mod I  Financial management: Mod I   MOBILITY STATUS: Hx of falls, difficulty carrying objections with ambulation, and supervision to Mod I  ACTIVITY TOLERANCE: Activity tolerance: better since sx, but chronically impaired to <5 mins standing unsupported and <15 mins supported tasks   FUNCTIONAL OUTCOME MEASURES: Eval: Quck DASH 22% impairment today  (Higher % Score  =  More Impairment)     UPPER EXTREMITY ROM     Shoulder to Wrist AROM Right eval Left eval Right 02/06/23 Left 02/06/23  Shoulder flexion 118 100 145 135  Shoulder abduction      Shoulder extension      Shoulder internal rotation      Shoulder external rotation      Elbow flexion Full Full    Elbow extension Full Full    Wrist flexion [facility] WFL    Wrist extension WFL WFL    (Blank rows = not tested)   Hand AROM Right eval  Left eval  Full Fist Ability (or Gap to Distal Palmar Crease) Full Full  Thumb Opposition to Small Finger (or Gap) Full to SF Full to SF  (Blank rows = not tested)   UPPER EXTREMITY MMT:    Eval:  NT at eval due to recent and still healing injuries. Will be tested when appropriate, but grossly 3/5 MMT or 3-/5MMT in b/l UEs.   MMT Right TBD Left TBD  Shoulder flexion    Shoulder abduction    Shoulder adduction    Shoulder extension    Shoulder internal rotation    Shoulder external rotation    Middle trapezius    Lower trapezius    Elbow flexion    Elbow extension    Forearm supination    Forearm pronation    Wrist flexion    Wrist extension    Wrist ulnar deviation    Wrist  radial deviation    (Blank rows = not tested)  HAND FUNCTION: Eval: Observed weakness in affected hand.  Grip strength Right: 60 lbs, Left: 50 lbs  Tip Pinch: Rt:  7# Lt: 8# Key Rt: 8# Lt: 7#  COORDINATION: Eval: Observed coordination impairments with BOTH hands- noted slower motion and decreased FMS. 9 Hole Peg Test Right: 1 min 13sec sec, Left: 21sec  SENSATION: Eval: Static 2-pt discrimination Rt: Median 10mm, ulnar 12mm; Lt: Median 9mm, Ulnar 7mm  OBSERVATIONS:   Eval: Seems like main issues are decreased fnl endurance, bil UE: FMS, ROM, strength, and decreased sensation.  02/06/23: Pt ambulatory with rollator. Pt well-kept.  02/13/2023: Pt requiring cues not to reach to floor due to precautions.   TODAY'S TREATMENT:  Reviewed entire coordination HEP and practiced remaining ex's # 4-7 extensively.  Reviewed putty HEP and therapist demo each  Pt copying peg design (medium sized pegs) for coordination Rt hand, then Lt hand - pt w/ min difficulty and drops and min cues to correct minimal mistakes. Pt with more difficulty Lt hand  PATIENT EDUCATION: See above  HOME EXERCISE PROGRAM: Access Code: [email or web address] URL: [email or web address] Date: 01/30/2023 Prepared by: Fannie Knee  02/06/23: BUE HEP 02/13/2023 - B green putty and coordination HEPs  GOALS: Goals reviewed with patient? Yes  SHORT TERM GOALS: Target date: 01/27/23  Pt will demo/state understanding of initial home exercise program (HEP) to improve function, pain, and independence.   Baseline: Needs a plan for rehabilitation  Goal status: MET   LONG TERM GOALS: Target date: 02/24/23  Pt will improve functional ability by decreased impairment per Quick DASH assessment to 15% or better, for better quality of life. Baseline: 22% impaired  Goal status: INITIAL  2.  Pt will improve A/ROM in bil sh flex AROM to at least 130* each, to have prerequisite/functional motion for tasks like reach and grasp.  Baseline: <120* for both 02/06/23: Rt - 145, Lt - 135 Goal status: MET  3.  Pt will improve strength in bil UEs to at least 4/5 MMT to have increased functional ability to carry out selfcare and higher-level homecare tasks with no difficulty Baseline: 3/5 MMT Goal status: INITIAL  4.  Pt will improve coordination skills in bil hands FMS, as seen by increasing score on 9HPT testing to at least <1 min, bilaterally to have increased functional ability to carry out fine motor tasks (fasteners, etc.) and more complex, coordinated IADLs (meal prep, sports, etc.).  Baseline: >46min 10sec bil Goal status: INITIAL  5.  Pt will increase static 2-point discrimination ability in Rt hand to <53mm to have better occupational participation in daily roles. Baseline: 10mm Median, 12mm Ulnar Goal status: INITIAL  6.  Pt will improve grip strength in Lt hand to at least 60lbs for functional use at home and in IADLs. Baseline: 50 lbs Goal status: INITIAL  ASSESSMENT:  CLINICAL IMPRESSION: Pt with increased difficulty using Lt hand more than Rt, however both impaired. Pt slowly progressing towards goals  PERFORMANCE DEFICITS: in functional skills including ADLs, IADLs, coordination, dexterity, sensation, ROM,  strength, flexibility, Fine motor control, body mechanics, endurance, decreased knowledge of precautions, and UE functional use, cognitive skills including problem solving and safety awareness, and psychosocial skills including coping strategies, environmental adaptation, and habits.    IMPAIRMENTS: are limiting patient from ADLs, IADLs, and rest and sleep.    CO-MORBIDITIES: may have co-morbidities  that affects occupational performance. Patient will benefit from skilled OT to address above impairments  and improve overall function.   MODIFICATION OR ASSISTANCE TO COMPLETE EVALUATION: No modification of tasks or assist necessary to complete an evaluation.   OT OCCUPATIONAL PROFILE AND HISTORY: Problem focused assessment: Including review of records relating to presenting problem.   CLINICAL DECISION MAKING: LOW - limited treatment options, no task modification necessary   REHAB POTENTIAL: Excellent   EVALUATION COMPLEXITY: Low  PLAN:  OT FREQUENCY: 1x/week  OT DURATION: 6 weeks  PLANNED INTERVENTIONS: self care/ADL training, therapeutic exercise, therapeutic activity, neuromuscular re-education, manual therapy, passive range of motion, compression bandaging, moist heat, cryotherapy, contrast bath, patient/family education, visual/perceptual remediation/compensation, energy conservation, coping strategies training, DME and/or AE instructions, and Re-evaluation  RECOMMENDED OTHER SERVICES: is getting PT as well   CONSULTED AND AGREED WITH PLAN OF CARE: Patient  PLAN FOR NEXT SESSION:  check progress towards goals - pt will need renewal vs. D/C next visit   Sheran Lawless, OT 02/20/2023, 11:06 AM

## 2023-02-20 NOTE — Therapy (Signed)
OUTPATIENT PHYSICAL THERAPY NEURO TREATMENT   Patient Name: Samuel [name] MRN: [id number] DOB:1960/12/16, 62 y.o., male Today's Date: 02/20/2023   PCP: Samuel Stabs, NP REFERRING PROVIDER: Jacquelynn Cree, PA-C  END OF SESSION:  PT End of Session - 02/20/23 1020     Visit Number 5    Number of Visits 9    Date for PT Re-Evaluation 03/20/23    Authorization Type [place] Medicaid    Progress Note Due on Visit 10    PT Start Time 1018    PT Stop Time 1059    PT Time Calculation (min) 41 min    Equipment Utilized During Treatment Gait belt    Activity Tolerance Patient tolerated treatment well    Behavior During Therapy WFL for tasks assessed/performed              Past Medical History:  Diagnosis Date   Alcohol abuse    Cervical myelopathy (HCC)    Coma (HCC)    was in hospital for 3+ months in the 80's--carbon monoxide poisoning.   Elevated LFTs    Gallbladder polyp    Pancreatitis    Renal cyst, left    Past Surgical History:  Procedure Laterality Date   ANTERIOR CERVICAL DECOMP/DISCECTOMY FUSION N/A 12/16/2022   Procedure: CERVICAL THREE FOUR ANTERIOR CERVICAL DISCENTOMY AND FUSION;  Surgeon: Samuel Sheer, MD;  Location: [facility] OR;  Service: Orthopedics;  Laterality: N/A;   NO PAST SURGERIES     Patient Active Problem List   Diagnosis Date Noted   Acute blood loss anemia 01/02/2023   Malnutrition of moderate degree 12/23/2022   Neurogenic bladder 12/22/2022   Cervical myelopathy (HCC) 12/16/2022   Pancreatic stones    Alcohol-induced chronic pancreatitis (HCC)    Abnormal LFTs    Pancreatic duct dilated 11/18/2018   Transaminitis 09/11/2018   HYPOKALEMIA 07/28/2010   TOBACCO ABUSE 07/28/2010   COCAINE ABUSE 07/28/2010   NEUROPATHY 07/28/2010   Hyponatremia 07/12/2010   Alcohol abuse 07/12/2010   Acute pancreatitis 07/12/2010    ONSET DATE: 01/02/2023 (referral date)  REFERRING DIAG: G95.9 (ICD-10-CM) - Cervical myelopathy  THERAPY DIAG:  Muscle  weakness (generalized)  Abnormality of gait and mobility  Unsteadiness on feet  Cervical myelopathy (HCC)  Rationale for Evaluation and Treatment: Rehabilitation  SUBJECTIVE:                                                                                                                                                                                             SUBJECTIVE STATEMENT: Pt goes by "Samuel [name]".  Pt reports no falls, no acute changes since last visit.  No pain today. Pt reports he has been doing his HEP, it takes him a little bit and makes him sore but he "gets it done".  Pt accompanied by: self-arrived via transportation per pt report  PERTINENT HISTORY: cervical myelopathy s/p ACDF C3/C4 on 12/16/22, chronic pancreatitis with diarrhea, GB polyps, abnormal LFTs, ETOH/Cocaine abuse in the  past (last (+) cocaine result 2012),  PAIN:  Are you having pain? No  PRECAUTIONS: Cervical, Fall, and Other: possible swallow with thickened liquids, (no lifting over 5lbs overhead)  - Cervical collar no longer required  WEIGHT BEARING RESTRICTIONS: No  FALLS: Has patient fallen in last 6 months? Yes. Number of falls 2 falls prior to surgery - catching foot on rug  PATIENT GOALS: "Be able to walk without a walker."  OBJECTIVE:   DIAGNOSTIC FINDINGS:   12/29/2022: IMPRESSION: Postoperative changes of anterior fusion C3-C4.  11/08/2022:  IMPRESSION: 1. Cervical spondylosis most pronounced at the C3-C4 level where there is severe canal stenosis with cord compression and severe bilateral foraminal stenosis. 2. Focal T2 hyperintense signal within the cord at the C3-C4 level where the cord is diminutive in caliber suggesting chronic myelomalacia. 3. Mild-to-moderate canal stenosis at C5-6 and mild canal stenosis at C4-5 and C6-7. 4. Moderate-severe left foraminal stenosis at C7-T1.   COGNITION: Overall cognitive status:  mild impairment with history but grossly WNL   TODAY'S  TREATMENT:                                                                                                                              Assessed in sitting at beginning of session: Vitals:   02/20/23 1024  BP: 112/67  Pulse: 82   GAIT: Gait pattern: decreased hip/knee flexion- Right, decreased ankle dorsiflexion- Right, shuffling, narrow BOS, and poor foot clearance- Right Distance walked: 115 ft Assistive device utilized: Walker - 4 wheeled Level of assistance: Modified independence Comments: at beginning of session to assess gait pattern  Gait pattern:  improved from initial assessment, see comments Distance walked: 460 ft Assistive device utilized: Environmental consultant - 4 wheeled Level of assistance: Modified independence Comments: following session practice of increasing step length and step height; initially exhibits exaggerated increased step length but with v/c able to exhibit more normal step length and improved RLE clearance with decrease in shuffling of gait pattern    TherAct: In // bars with BUE support to work on increasing step height and step length: Forwards yard stick steps overs alt L/R 6 x 10 ft Forwards foam beam step overs alt L/R 6 x 10 ft Lateral foam beam step overs 3 x 10 ft L/R each direction Forwards 4" hurdle step overs 6 x 10 ft  In // bars with 0-1 UE support to work on standing balance as well as increasing step height and step length with CGA/min A: Alt L/R forwards foam beam step overs with one UE support, 2 x 10 reps Lateral foam beam step overs 2  x 10 reps B with each LE, no UE support    PATIENT EDUCATION: Education details: continue HEP, purpose of tasks performed during therapy session Person educated: Patient Education method: Explanation and Demonstration Education comprehension: verbalized understanding and needs further education  HOME EXERCISE PROGRAM: Access Code: [email or web address] URL: [email or web address] Date: 01/30/2023 Prepared  by: Samuel [name]  Exercises - Sit to Stand  - 1 x daily - 7 x weekly - 3 sets - 10 reps - Corner Balance Feet Together With Eyes Open  - 1 x daily - 5 x weekly - 1 sets - 2-3 reps - 30 seconds hold - Wide Stance with Eyes Closed  - 1 x daily - 5 x weekly - 1 sets - 2-3 reps - 30 seconds hold - Standing Single Leg Stance with Counter Support  - 1 x daily - 5 x weekly - 1 sets - 2-3 reps - 30 seconds hold - Standing March with Counter Support  - 1 x daily - 5 x weekly - 2 sets - 20 reps  GOALS: Goals reviewed with patient? Yes  SHORT TERM GOALS: Target date: 02/06/2023  Patient will demonstrate 100% compliance with initial HEP to continue to progress between physical therapy sessions.     Baseline: Patient's reports exercise is going alright at home Goal status: MET  2. Patient will improve gait speed to 0.66 m/s with LRAD to progress towards PLOF and demonstrate progress towards a reduced risk for falls.  Baseline: 17.88" or 0.56 m/s with rollator + CGA; improved to 16.0" to 0.63 m/s with rollator Goal status: IN PROGRESS  3.  Patient will improve TUG score to 16 seconds or less with LRAD to indicate clinically significant progress towards a decreased risk of falls and improved mobility.  Baseline: 19.79" with rollator + CGA-SBA; improved to 15" with rollator + CGA Goal status: MET  4.  Patient will improve their 5x Sit to Stand score to less than 15 seconds to demonstrate a decreased risk for falls and improved LE strength.   Baseline: 16.9" without UE use, WBOS (SBA); improved 15.09" without UE support (SBA) Goal status: IN PROGRESS  5.  Patient will improve Berg Balance score by 5 points to indicate clinically significant progress towards a decreased risk of falls and improved static stability.   Baseline: 26/56; improved to 35 Goal status: MET   LONG TERM GOALS: Target date: 03/06/2023  Patient will report demonstrate independence with final HEP in order to maintain current  gains and continue to progress after physical therapy discharge.   Baseline: To be provided Goal status: INITIAL  2.  Patient will improve gait speed to 0.70 m/s or less with LRAD to progress towards PLOF and demonstrate progress towards a reduced risk for falls.  Baseline: 17.88" or 0.56 m/s with rollator + CGA; improved to 16.0" to 0.63 m/s with rollator Goal status: IN PROGRESS  3. Patient will improve TUG score to 15 seconds or less with LRAD to indicate clinically significant progress towards a decreased risk of falls and improved mobility.  Baseline: 19.79" with rollator + CGA-SBA; improved to 15" with rollator + CGA Goal status: MET  4.  Patient will improve Berg Balance score by 10 points to indicate clinically significant progress towards a decreased risk of falls and improved static stability.   Baseline: 26/56; improved to 35/56 Goal status: IN PROGRESS   ASSESSMENT:  CLINICAL IMPRESSION: Emphasis of skilled PT session on working on improving gait mechanics including increasing step length and step  height for improved RLE clearance during gait. With blocked practice of this during therapy session pt exhibits good carryover to gait with improved mechanics as noted above. Pt continues to benefit from skilled therapy services to work towards improving his overall balance, LE strength, and improve his safety and independence with functional mobility. Continue POC.   OBJECTIVE IMPAIRMENTS: Abnormal gait, decreased activity tolerance, decreased balance, decreased coordination, decreased knowledge of use of DME, decreased mobility, difficulty walking, decreased ROM, decreased strength, and decreased safety awareness.   ACTIVITY LIMITATIONS: carrying, bending, standing, squatting, and reach over head  PARTICIPATION LIMITATIONS: community activity, occupation, and yard work  PERSONAL FACTORS: Social background and 3+ comorbidities: see above  are also affecting patient's functional  outcome.   REHAB POTENTIAL: Good  CLINICAL DECISION MAKING: Evolving/moderate complexity  EVALUATION COMPLEXITY: Moderate  PLAN:  PT FREQUENCY: 1x/week  PT DURATION: 8 weeks  PLANNED INTERVENTIONS: Therapeutic exercises, Therapeutic activity, Neuromuscular re-education, Balance training, Gait training, Patient/Family education, Self Care, DME instructions, Manual therapy, and Re-evaluation  PLAN FOR NEXT SESSION: modify HEP prn, rollator safety / training, cervical precautions, dynamic / static balance tasks with functional reaching, stepping to targets  Peter Congo, PT, DPT, CSRS 02/20/2023, 11:00 AM  Check all possible CPT codes: 16109 - PT Re-evaluation, 97110- Therapeutic Exercise, [phone]- Neuro Re-education, [phone] - Gait Training, [phone] - Manual Therapy, 97530 - Therapeutic Activities, and 97535 - Self Care    Check all conditions that are expected to impact treatment: Musculoskeletal disorders, Neurological condition, and Social determinants of health   If treatment provided at initial evaluation, no treatment charged due to lack of authorization.

## 2023-02-27 ENCOUNTER — Ambulatory Visit: Payer: Medicaid Other | Admitting: Occupational Therapy

## 2023-02-27 ENCOUNTER — Encounter: Payer: Self-pay | Admitting: Physical Therapy

## 2023-02-27 ENCOUNTER — Ambulatory Visit: Payer: Medicaid Other | Admitting: Physical Therapy

## 2023-02-27 VITALS — BP 124/72 | HR 57

## 2023-02-27 DIAGNOSIS — M6281 Muscle weakness (generalized): Secondary | ICD-10-CM

## 2023-02-27 DIAGNOSIS — R2681 Unsteadiness on feet: Secondary | ICD-10-CM

## 2023-02-27 DIAGNOSIS — R202 Paresthesia of skin: Secondary | ICD-10-CM

## 2023-02-27 DIAGNOSIS — R278 Other lack of coordination: Secondary | ICD-10-CM | POA: Diagnosis not present

## 2023-02-27 DIAGNOSIS — G959 Disease of spinal cord, unspecified: Secondary | ICD-10-CM

## 2023-02-27 DIAGNOSIS — R269 Unspecified abnormalities of gait and mobility: Secondary | ICD-10-CM

## 2023-02-27 NOTE — Therapy (Signed)
OUTPATIENT OCCUPATIONAL THERAPY TREATMENT and DISCHARGE NOTE  Patient Name: Samuel [name] MRN: [id number] DOB:08-23-1961, 62 y.o., male Today's Date: 02/27/2023  PCP: Ricky Stabs, NP REFERRING PROVIDER: Jacquelynn Cree, PA-C   END OF SESSION:  OT End of Session - 02/27/23 1111     Visit Number 6    Number of Visits 7    Date for OT Re-Evaluation 02/24/23    Authorization Type [employer] - no auth required    OT Start Time 1145    OT Stop Time 1228    OT Time Calculation (min) 43 min    Equipment Utilized During Treatment Testing materials    Activity Tolerance Patient tolerated treatment well    Behavior During Therapy WFL for tasks assessed/performed             Past Medical History:  Diagnosis Date   Alcohol abuse    Cervical myelopathy (HCC)    Coma (HCC)    was in hospital for 3+ months in the 80's--carbon monoxide poisoning.   Elevated LFTs    Gallbladder polyp    Pancreatitis    Renal cyst, left    Past Surgical History:  Procedure Laterality Date   ANTERIOR CERVICAL DECOMP/DISCECTOMY FUSION N/A 12/16/2022   Procedure: CERVICAL THREE FOUR ANTERIOR CERVICAL DISCENTOMY AND FUSION;  Surgeon: London Sheer, MD;  Location: [facility] OR;  Service: Orthopedics;  Laterality: N/A;   NO PAST SURGERIES     Patient Active Problem List   Diagnosis Date Noted   Acute blood loss anemia 01/02/2023   Malnutrition of moderate degree 12/23/2022   Neurogenic bladder 12/22/2022   Cervical myelopathy (HCC) 12/16/2022   Pancreatic stones    Alcohol-induced chronic pancreatitis (HCC)    Abnormal LFTs    Pancreatic duct dilated 11/18/2018   Transaminitis 09/11/2018   HYPOKALEMIA 07/28/2010   TOBACCO ABUSE 07/28/2010   COCAINE ABUSE 07/28/2010   NEUROPATHY 07/28/2010   Hyponatremia 07/12/2010   Alcohol abuse 07/12/2010   Acute pancreatitis 07/12/2010    ONSET DATE: 12/16/22 DOS (C3-4 fusion)   REFERRING DIAG: G95.9 (ICD-10-CM) - Cervical myelopathy (HCC)   THERAPY  DIAG:  Cervical myelopathy (HCC)  Muscle weakness (generalized)  Other lack of coordination  Paresthesia of skin  Rationale for Evaluation and Treatment: Rehabilitation  PERTINENT HISTORY: Now 5 weeks s/p cervical fusion C3-4.  Per in-patient rehab OT notes: "62 yo M admitted to [facility] on 3/8 for cervical myelopathy, s/p C3-4 ACDF. PMH: cocaine and ETOH abuse"  In the hospital he was weak b/l UEs L > R, and D/C at Min A for LB BADLs and Sup - Mod I for other BADLs.   Per OT eval: "He states before his neck fusion ~3 weeks ago, he couldn't feel his hands, feet, or have sensation to peritoneal areas. Now these are improving, as well as his functional endurance. He states he can stand unassisted for ~3 mins now, but usually uses rollator. He states he has to "plan ahead" to use the bathroom.  He uses access Zenaida Niece to arrive today. He got home from hospital last Tues."   PRECAUTIONS: As of 02/06/23 per Dr. Kathi Der note: Plan: -Out of bed as tolerated, discontinue aspen collar -No bending/lifting/twisting greater than 10 pounds -Okay to submerge wound -Pain control: OTC medications -Discussed smoking cessation today as he is still at risk for pseudoarthrosis -Follow up in office in approximately 6 weeks, x-rays at next visit: AP/lateral/flex/ex cervical  WEIGHT BEARING RESTRICTIONS: Yes <10# now, or until cleared by  MD as of 02/06/23 per Dr. Kathi Der note.  SUBJECTIVE:    SUBJECTIVE STATEMENT: No pain and no recent falls, no changes to make therapist aware of. He has been doing his shoulder, arm and hand exercises and reports that he still has his handouts. He does reports some of his exercises are tough - especially working on the ink pen ie) turning it around etc. Pt states he was able to hold a potato and peel it recently which he was not able to do previously.  Upon discussion re: continued therapy or discharge, he reports being comfortable carrying out is own exercises at home and that the Dr.  Reported that he most likely would not get back to 100%, therefore chose to DC at this time  PAIN:  Are you having pain? No    FALLS: Has patient fallen in last 6 months? Yes. Number of falls 1-2 times (all before sx)   LIVING ENVIRONMENT: Lives with:  his roommate Stairs: No Has following equipment at home: Dan Humphreys - 2 wheeled, shower chair, and rollator, built up foam handles, therapy putty  PLOF: Requires assistive device for independence, Needs assistance with homemaking, and he states his roommate even helped him to the bathroom pre-sx. This had been "going on for a while"  PATIENT GOALS: To get better balance, functional endurance and use of his hands for basic home tasks   OBJECTIVE: (All objective assessments below are from initial evaluation on: 01/09/23 unless otherwise specified.)   HAND DOMINANCE: Right   ADLs: Overall ADLs: overall he's functioning at distant supervision to Mod I level for basic ADLS, most difficulty due to c-collar, poor use of hands and poor balance/fnl endurance  Transfers/ambulation related to ADLs: supervision Eating: Mod I  Grooming: Mod I  UB Dressing: Mod I  LB Dressing: supervision at times for safety  Toileting: supervision at times for safety  Bathing: assist for back washing only  Tub Shower transfers: safety supervision only  Equipment: Transfer tub bench, Long handled sponge, and Feeding equipment  IADLs: Shopping: supervision  Light housekeeping: Supervision  Meal Prep: Supervision  Community mobility: Supervision  Medication management: Mod I  Financial management: Mod I   MOBILITY STATUS: Hx of falls, difficulty carrying objections with ambulation, and supervision to Mod I  ACTIVITY TOLERANCE: Activity tolerance: better since sx, but chronically impaired to <5 mins standing unsupported and <15 mins supported tasks   FUNCTIONAL OUTCOME MEASURES: Eval: Quck DASH 22% impairment today  (Higher % Score  =  More Impairment)      UPPER EXTREMITY ROM     Shoulder to Wrist AROM Right eval Left eval Right 02/06/23 Left 02/06/23  Shoulder flexion 118 100 145 135  Shoulder abduction      Shoulder extension      Shoulder internal rotation      Shoulder external rotation      Elbow flexion Full Full    Elbow extension Full Full    Wrist flexion [facility] WFL    Wrist extension WFL WFL    (Blank rows = not tested)   Hand AROM Right eval Left eval  Full Fist Ability (or Gap to Distal Palmar Crease) Full Full  Thumb Opposition to Small Finger (or Gap) Full to SF Full to SF  (Blank rows = not tested)   UPPER EXTREMITY MMT:    Eval:  NT at eval due to recent and still healing injuries. Will be tested when appropriate, but grossly 3/5 MMT or 3-/5MMT in b/l UEs.  MMT Right TBD Left TBD  Shoulder flexion    Shoulder abduction    Shoulder adduction    Shoulder extension    Shoulder internal rotation    Shoulder external rotation    Middle trapezius    Lower trapezius    Elbow flexion    Elbow extension    Forearm supination    Forearm pronation    Wrist flexion    Wrist extension    Wrist ulnar deviation    Wrist radial deviation    (Blank rows = not tested)  HAND FUNCTION: Eval: Observed weakness in affected hand.  Grip strength Right: 60 lbs, Left: 50 lbs  Tip Pinch: Rt:  7# Lt: 8# Key Rt: 8# Lt: 7#  02/27/23 -- R 55.9 lbs  L 44.5 lbs  COORDINATION: Eval: Observed coordination impairments with BOTH hands- noted slower motion and decreased FMS. 9 Hole Peg Test Right: 1 min 13sec sec, Left: 21sec  02/27/23 Right 1:16 sec; Left 1:45 sec  SENSATION: Eval: Static 2-pt discrimination Rt: Median 10mm, ulnar 12mm; Lt: Median 9mm, Ulnar 7mm  02/27/23: Static 2-pt R 8-10 mm but not consistent and better vertical than horizontal on fingertip  OBSERVATIONS:   Eval: Seems like main issues are decreased fnl endurance, bil UE: FMS, ROM, strength, and decreased sensation.  02/06/23: Pt ambulatory  with rollator. Pt well-kept.  02/13/2023: Pt requiring cues not to reach to floor due to precautions.   TODAY'S TREATMENT:   Therapeutic Activities  Reviewed HEP r/t coordination and ROM HEPs with DAILY carryover recommended upon DC from OT.Marland Kitchen  Modified/added minor change to pen coordination activities ie) pen flip to pen translation from top to bottom.  Encouraged patient to go to a dollar store and look for some fine motor tasks to work on ie) Academic librarian and other fine motor games.  Also worked on writing/pen use with patient engaged in large print word search puzzle with patient able to draw lines through words and encouraged to work on circling words as able, as well as adding the date and working on signing his name regularly.   Reviewed tests - grip, FM (9 hole peg test) and sensory discrimination (updated in goals)    HOME EXERCISE PROGRAM: Access Code: [email or web address] URL: [email or web address] Date: 01/30/2023 Prepared by: Fannie Knee  02/06/23: BUE HEP 02/13/2023 - B green putty and coordination HEPs  GOALS: Goals reviewed with patient? Yes  SHORT TERM GOALS: Target date: 01/27/23  Pt will demo/state understanding of initial home exercise program (HEP) to improve function, pain, and independence.   Baseline: Needs a plan for rehabilitation  Goal status: MET   LONG TERM GOALS: Target date: 02/24/23  Pt will improve functional ability by decreased impairment per Quick DASH assessment to 15% or better, for better quality of life. Baseline: 22% impaired  Goal status: MET  2.  Pt will improve A/ROM in bil sh flex AROM to at least 130* each, to have prerequisite/functional motion for tasks like reach and grasp.  Baseline: <120* for both 02/06/23: Rt - 145, Lt - 135 Goal status: MET  3.  Pt will improve strength in bil UEs to at least 4/5 MMT to have increased functional ability to carry out selfcare and higher-level homecare tasks with no difficulty Baseline:  3/5 MMT Goal status: MET  4.  Pt will improve coordination skills in bil hands FMS, as seen by increasing score on 9HPT testing to at least <1 min, bilaterally to have increased functional ability to carry  out fine motor tasks (fasteners, etc.) and more complex, coordinated IADLs (meal prep, sports, etc.).  Baseline: >47min 10sec bil 02/27/23 - Right 1:16 sec; Left 1:45 sec Goal status: IN PROGRESS/NOT MET but subjective improvements confirm improvements  5.  Pt will increase static 2-point discrimination ability in Rt hand to <15mm to have better occupational participation in daily roles. Baseline: 10mm Median, 12mm Ulnar 02/27/23 - 8-10 mm Goal status: In PROGRESS/NOT MET with compensatory instruction provided  6.  Pt will improve grip strength in Lt hand to at least 60lbs for functional use at home and in IADLs. Baseline: 50 lbs 02/27/23 - 44.5 lbs Goal status: IN PROGRESS/NOT MET but subjective improvements confirm improvements with functional tasks  ASSESSMENT:  CLINICAL IMPRESSION:  Visits from Start of Care: 6  Current functional level related to goals / functional outcomes: Patient reports improvement in all aspects of ADLs and IADLS ie) able to help with meal prep including B UE tasks such as holding and peeling a potato etc  Patient met 4/7 established goals.   Remaining deficits: Patient continues to have B UE weakness, incoordination and sensory deficits but is satisfied with HEP and his ability to carryover program at home.   Education / Equipment: Theraputty and Scientist, product/process development.   Patient agrees to discharge. Patient goals were partially met. Patient is being discharged due to being pleased with the current functional level.Marland Kitchen     PERFORMANCE DEFICITS: in functional skills including ADLs, IADLs, coordination, dexterity, sensation, ROM, strength, flexibility, Fine motor control, body mechanics, endurance, decreased knowledge of precautions, and UE  functional use, cognitive skills including problem solving and safety awareness, and psychosocial skills including coping strategies, environmental adaptation, and habits.    IMPAIRMENTS: are limiting patient from ADLs, IADLs, and rest and sleep.    CO-MORBIDITIES: may have co-morbidities  that affects occupational performance. Patient will benefit from skilled OT to address above impairments and improve overall function.    PLAN:  OT FREQUENCY: 1x/week  OT DURATION: 6 weeks  PLANNED INTERVENTIONS: self care/ADL training, therapeutic exercise, therapeutic activity, neuromuscular re-education, manual therapy, passive range of motion, compression bandaging, moist heat, cryotherapy, contrast bath, patient/family education, visual/perceptual remediation/compensation, energy conservation, coping strategies training, DME and/or AE instructions, and Re-evaluation  RECOMMENDED OTHER SERVICES: is getting PT as well   CONSULTED AND AGREED WITH PLAN OF CARE: Patient  PLAN FOR FOLLOW_UP:  Recommend follow up in a 3-6 months to ensure good carryover of HEP, continued improvements in FM and grip strength and ensure no further loss of function or difficulty with ADLs.    Victorino Sparrow, OT 02/27/2023, 1:03 PM

## 2023-02-27 NOTE — Therapy (Signed)
OUTPATIENT PHYSICAL THERAPY NEURO TREATMENT   Patient Name: Samuel [name] MRN: [id number] DOB:Feb 18, 1961, 62 y.o., male Today's Date: 02/27/2023   PCP: Ricky Stabs, NP REFERRING PROVIDER: Jacquelynn Cree, PA-C  END OF SESSION:  PT End of Session - 02/27/23 1104     Visit Number 6    Number of Visits 9    Date for PT Re-Evaluation 03/20/23    Authorization Type [employer] Medicare    Progress Note Due on Visit 10    PT Start Time 1100    PT Stop Time 1145    PT Time Calculation (min) 45 min    Equipment Utilized During Treatment Gait belt    Activity Tolerance Patient tolerated treatment well    Behavior During Therapy WFL for tasks assessed/performed              Past Medical History:  Diagnosis Date   Alcohol abuse    Cervical myelopathy (HCC)    Coma (HCC)    was in hospital for 3+ months in the 80's--carbon monoxide poisoning.   Elevated LFTs    Gallbladder polyp    Pancreatitis    Renal cyst, left    Past Surgical History:  Procedure Laterality Date   ANTERIOR CERVICAL DECOMP/DISCECTOMY FUSION N/A 12/16/2022   Procedure: CERVICAL THREE FOUR ANTERIOR CERVICAL DISCENTOMY AND FUSION;  Surgeon: London Sheer, MD;  Location: [facility] OR;  Service: Orthopedics;  Laterality: N/A;   NO PAST SURGERIES     Patient Active Problem List   Diagnosis Date Noted   Acute blood loss anemia 01/02/2023   Malnutrition of moderate degree 12/23/2022   Neurogenic bladder 12/22/2022   Cervical myelopathy (HCC) 12/16/2022   Pancreatic stones    Alcohol-induced chronic pancreatitis (HCC)    Abnormal LFTs    Pancreatic duct dilated 11/18/2018   Transaminitis 09/11/2018   HYPOKALEMIA 07/28/2010   TOBACCO ABUSE 07/28/2010   COCAINE ABUSE 07/28/2010   NEUROPATHY 07/28/2010   Hyponatremia 07/12/2010   Alcohol abuse 07/12/2010   Acute pancreatitis 07/12/2010    ONSET DATE: 01/02/2023 (referral date)  REFERRING DIAG: G95.9 (ICD-10-CM) - Cervical myelopathy  THERAPY DIAG:  Muscle  weakness (generalized)  Abnormality of gait and mobility  Unsteadiness on feet  Rationale for Evaluation and Treatment: Rehabilitation  SUBJECTIVE:                                                                                                                                                                                             SUBJECTIVE STATEMENT: Pt goes by "Samuel [name]".  Patient states he is doing well. Patient states in general he feels like he  is getting better and better. Patient denies falls/near falls.   Pt accompanied by: self-arrived via transportation per pt report  PERTINENT HISTORY: cervical myelopathy s/p ACDF C3/C4 on 12/16/22, chronic pancreatitis with diarrhea, GB polyps, abnormal LFTs, ETOH/Cocaine abuse in the  past (last (+) cocaine result 2012),  PAIN:  Are you having pain? No  PRECAUTIONS: Cervical, Fall, and Other: possible swallow with thickened liquids, (no lifting over 5lbs overhead)  - Cervical collar no longer required  WEIGHT BEARING RESTRICTIONS: No  FALLS: Has patient fallen in last 6 months? Yes. Number of falls 2 falls prior to surgery - catching foot on rug  PATIENT GOALS: "Be able to walk without a walker."  OBJECTIVE:   DIAGNOSTIC FINDINGS:   12/29/2022: IMPRESSION: Postoperative changes of anterior fusion C3-C4.  11/08/2022:  IMPRESSION: 1. Cervical spondylosis most pronounced at the C3-C4 level where there is severe canal stenosis with cord compression and severe bilateral foraminal stenosis. 2. Focal T2 hyperintense signal within the cord at the C3-C4 level where the cord is diminutive in caliber suggesting chronic myelomalacia. 3. Mild-to-moderate canal stenosis at C5-6 and mild canal stenosis at C4-5 and C6-7. 4. Moderate-severe left foraminal stenosis at C7-T1.   COGNITION: Overall cognitive status:  mild impairment with history but grossly WNL   TODAY'S TREATMENT:                                                                                                                               Assessed in sitting at beginning of session: Vitals:   02/27/23 1108  BP: 124/72  Pulse: (!) 57    GAIT: Gait pattern: decreased hip/knee flexion- Right, decreased ankle dorsiflexion- Right, shuffling, narrow BOS, and poor foot clearance- Right, intermittent overshooting/undershooting movement Distance walked: 2 x 115' forward, 2 x 115' backward Assistive device utilized: None Level of assistance: CGA (minA two times with fatigue on backwards walking second lap) Comments: Fatigues more quickly with backwards walking, stride length reduces with fatigue, cued to keep head upright in neutral position  Outdoor Gait: Gait pattern: Improved hip/knee flexion, reduced overshooting/undershooting movement Distance walked: 800 ft Assistive device utilized: Environmental consultant - 4 wheeled Level of assistance: Modified independence Comments: Worked on navigating on sidewalks up and down low grade ramps, no major difficulty and patient demonstrated appropriate use of breaks/wall when taking seated break on rollator  NMR:  Between // bars without UE support:  - Step over 6" hurdles 8 x 10' (CGA)   - knocked over one hurdle   PATIENT EDUCATION: Education details: Continue HEP/ do not attempt ambulating at home without AD Person educated: Patient Education method: Explanation and Demonstration Education comprehension: verbalized understanding and needs further education  HOME EXERCISE PROGRAM: Access Code: [email or web address] URL: [email or web address] Date: 01/30/2023 Prepared by: Maryruth Eve  Exercises - Sit to Stand  - 1 x daily - 7 x weekly - 3 sets - 10 reps - Corner Balance Feet Together With Eyes Open  - 1  x daily - 5 x weekly - 1 sets - 2-3 reps - 30 seconds hold - Wide Stance with Eyes Closed  - 1 x daily - 5 x weekly - 1 sets - 2-3 reps - 30 seconds hold - Standing Single Leg Stance with Counter Support  - 1 x daily - 5 x  weekly - 1 sets - 2-3 reps - 30 seconds hold - Standing March with Counter Support  - 1 x daily - 5 x weekly - 2 sets - 20 reps  GOALS: Goals reviewed with patient? Yes  SHORT TERM GOALS: Target date: 02/06/2023  Patient will demonstrate 100% compliance with initial HEP to continue to progress between physical therapy sessions.     Baseline: Patient's reports exercise is going alright at home Goal status: MET  2. Patient will improve gait speed to 0.66 m/s with LRAD to progress towards PLOF and demonstrate progress towards a reduced risk for falls.  Baseline: 17.88" or 0.56 m/s with rollator + CGA; improved to 16.0" to 0.63 m/s with rollator Goal status: IN PROGRESS  3.  Patient will improve TUG score to 16 seconds or less with LRAD to indicate clinically significant progress towards a decreased risk of falls and improved mobility.  Baseline: 19.79" with rollator + CGA-SBA; improved to 15" with rollator + CGA Goal status: MET  4.  Patient will improve their 5x Sit to Stand score to less than 15 seconds to demonstrate a decreased risk for falls and improved LE strength.   Baseline: 16.9" without UE use, WBOS (SBA); improved 15.09" without UE support (SBA) Goal status: IN PROGRESS  5.  Patient will improve Berg Balance score by 5 points to indicate clinically significant progress towards a decreased risk of falls and improved static stability.   Baseline: 26/56; improved to 35 Goal status: MET   LONG TERM GOALS: Target date: 03/06/2023  Patient will report demonstrate independence with final HEP in order to maintain current gains and continue to progress after physical therapy discharge.   Baseline: To be provided Goal status: INITIAL  2.  Patient will improve gait speed to 0.70 m/s or less with LRAD to progress towards PLOF and demonstrate progress towards a reduced risk for falls.  Baseline: 17.88" or 0.56 m/s with rollator + CGA; improved to 16.0" to 0.63 m/s with rollator Goal  status: IN PROGRESS  3. Patient will improve TUG score to 15 seconds or less with LRAD to indicate clinically significant progress towards a decreased risk of falls and improved mobility.  Baseline: 19.79" with rollator + CGA-SBA; improved to 15" with rollator + CGA Goal status: MET  4.  Patient will improve Berg Balance score by 10 points to indicate clinically significant progress towards a decreased risk of falls and improved static stability.   Baseline: 26/56; improved to 35/56 Goal status: IN PROGRESS   ASSESSMENT:  CLINICAL IMPRESSION: Session focused on gait endurance and balance with ambulation without AD and CGA from therapist with strong education that use of AD is required at home with back half of session working on step overs and use of rollator outdoors. Patient modI both indoors/outdoors with rollator use. Patient fatigues quickly with backwards walking but is able to complete with primarily CGA. Continue POC with plan for D/C with updated HEP next session pending patient's progress towards goals.  OBJECTIVE IMPAIRMENTS: Abnormal gait, decreased activity tolerance, decreased balance, decreased coordination, decreased knowledge of use of DME, decreased mobility, difficulty walking, decreased ROM, decreased strength, and decreased safety  awareness.   ACTIVITY LIMITATIONS: carrying, bending, standing, squatting, and reach over head  PARTICIPATION LIMITATIONS: community activity, occupation, and yard work  PERSONAL FACTORS: Social background and 3+ comorbidities: see above  are also affecting patient's functional outcome.   REHAB POTENTIAL: Good  CLINICAL DECISION MAKING: Evolving/moderate complexity  EVALUATION COMPLEXITY: Moderate  PLAN:  PT FREQUENCY: 1x/week  PT DURATION: 8 weeks  PLANNED INTERVENTIONS: Therapeutic exercises, Therapeutic activity, Neuromuscular re-education, Balance training, Gait training, Patient/Family education, Self Care, DME instructions,  Manual therapy, and Re-evaluation  PLAN FOR NEXT SESSION: modify HEP prn, rollator safety / training, cervical precautions, dynamic / static balance tasks with functional reaching, stepping to targets, Plan for D/C next session pending progress  Carmelia Bake, PT, DPT 02/27/2023, 6:03 PM  Check all possible CPT codes: 16109 - PT Re-evaluation, 97110- Therapeutic Exercise, [phone]- Neuro Re-education, [phone] - Gait Training, [phone] - Manual Therapy, 97530 - Therapeutic Activities, and [phone] - Self Care    Check all conditions that are expected to impact treatment: Musculoskeletal disorders, Neurological condition, and Social determinants of health   If treatment provided at initial evaluation, no treatment charged due to lack of authorization.

## 2023-03-07 ENCOUNTER — Encounter: Payer: Self-pay | Admitting: Physical Therapy

## 2023-03-07 ENCOUNTER — Ambulatory Visit: Payer: Medicaid Other | Admitting: Physical Therapy

## 2023-03-07 VITALS — BP 133/73 | HR 69

## 2023-03-07 DIAGNOSIS — M6281 Muscle weakness (generalized): Secondary | ICD-10-CM

## 2023-03-07 DIAGNOSIS — R2681 Unsteadiness on feet: Secondary | ICD-10-CM

## 2023-03-07 DIAGNOSIS — R278 Other lack of coordination: Secondary | ICD-10-CM | POA: Diagnosis not present

## 2023-03-07 DIAGNOSIS — R269 Unspecified abnormalities of gait and mobility: Secondary | ICD-10-CM

## 2023-03-07 NOTE — Therapy (Signed)
OUTPATIENT PHYSICAL THERAPY NEURO TREATMENT / DISCHARGE   Patient Name: Samuel [name] MRN: [id number] DOB:Jun 12, 1961, 62 y.o., male Today's Date: 03/07/2023   PCP: Samuel Stabs, NP REFERRING PROVIDER: Jacquelynn Cree, PA-C  END OF SESSION:  PT End of Session - 03/07/23 1055     Visit Number 7    Number of Visits 9    Date for PT Re-Evaluation 03/20/23    Authorization Type [employer]    Progress Note Due on Visit 10    PT Start Time 1054    Equipment Utilized During Treatment Gait belt    Activity Tolerance Patient tolerated treatment well    Behavior During Therapy WFL for tasks assessed/performed              Past Medical History:  Diagnosis Date   Alcohol abuse    Cervical myelopathy (HCC)    Coma (HCC)    was in hospital for 3+ months in the 80's--carbon monoxide poisoning.   Elevated LFTs    Gallbladder polyp    Pancreatitis    Renal cyst, left    Past Surgical History:  Procedure Laterality Date   ANTERIOR CERVICAL DECOMP/DISCECTOMY FUSION N/A 12/16/2022   Procedure: CERVICAL THREE FOUR ANTERIOR CERVICAL DISCENTOMY AND FUSION;  Surgeon: Samuel Sheer, MD;  Location: [facility] OR;  Service: Orthopedics;  Laterality: N/A;   NO PAST SURGERIES     Patient Active Problem List   Diagnosis Date Noted   Acute blood loss anemia 01/02/2023   Malnutrition of moderate degree 12/23/2022   Neurogenic bladder 12/22/2022   Cervical myelopathy (HCC) 12/16/2022   Pancreatic stones    Alcohol-induced chronic pancreatitis (HCC)    Abnormal LFTs    Pancreatic duct dilated 11/18/2018   Transaminitis 09/11/2018   HYPOKALEMIA 07/28/2010   TOBACCO ABUSE 07/28/2010   COCAINE ABUSE 07/28/2010   NEUROPATHY 07/28/2010   Hyponatremia 07/12/2010   Alcohol abuse 07/12/2010   Acute pancreatitis 07/12/2010    ONSET DATE: 01/02/2023 (referral date)  REFERRING DIAG: G95.9 (ICD-10-CM) - Cervical myelopathy  THERAPY DIAG:  Abnormality of gait and mobility  Unsteadiness on  feet  Muscle weakness (generalized)  Rationale for Evaluation and Treatment: Rehabilitation  SUBJECTIVE:                                                                                                                                                                                             SUBJECTIVE STATEMENT: Pt goes by "Samuel [name]".  Patient states he is doing well. Patient denies falls/near falls. Patient reports that exercises are going okay at home. Patient agreeable to discharge.  Pt accompanied by: self-arrived  via transportation per pt report  PERTINENT HISTORY: cervical myelopathy s/p ACDF C3/C4 on 12/16/22, chronic pancreatitis with diarrhea, GB polyps, abnormal LFTs, ETOH/Cocaine abuse in the  past (last (+) cocaine result 2012),  PAIN:  Are you having pain? No  PRECAUTIONS: Cervical, Fall, and Other: possible swallow with thickened liquids, (no lifting over 5lbs overhead)  - Cervical collar no longer required  WEIGHT BEARING RESTRICTIONS: No  FALLS: Has patient fallen in last 6 months? Yes. Number of falls 2 falls prior to surgery - catching foot on rug  PATIENT GOALS: "Be able to walk without a walker."  OBJECTIVE:   DIAGNOSTIC FINDINGS:   12/29/2022: IMPRESSION: Postoperative changes of anterior fusion C3-C4.  11/08/2022:  IMPRESSION: 1. Cervical spondylosis most pronounced at the C3-C4 level where there is severe canal stenosis with cord compression and severe bilateral foraminal stenosis. 2. Focal T2 hyperintense signal within the cord at the C3-C4 level where the cord is diminutive in caliber suggesting chronic myelomalacia. 3. Mild-to-moderate canal stenosis at C5-6 and mild canal stenosis at C4-5 and C6-7. 4. Moderate-severe left foraminal stenosis at C7-T1.   COGNITION: Overall cognitive status:  mild impairment with history but grossly WNL   TODAY'S TREATMENT:                                                                                                                               Assessed in sitting at beginning of session: Vitals:   03/07/23 1056  BP: 133/73  Pulse: 69    [facility] PT Assessment - 03/07/23 0001       Standardized Balance Assessment   Standardized Balance Assessment Timed Up and Go Test;10 meter walk test;Five Times Sit to Stand    Five times sit to stand comments  12.64   sec without UE use (SBA)   10 Meter Walk 0.64   m/s with rollator (SBA)     Berg Balance Test   Sit to Stand Able to stand without using hands and stabilize independently    Standing Unsupported Able to stand safely 2 minutes    Sitting with Back Unsupported but Feet Supported on Floor or Stool Able to sit safely and securely 2 minutes    Stand to Sit Sits safely with minimal use of hands    Transfers Able to transfer safely, definite need of hands    Standing Unsupported with Eyes Closed Able to stand 10 seconds safely    Standing Unsupported with Feet Together Able to place feet together independently and stand 1 minute safely    From Standing, Reach Forward with Outstretched Arm Can reach forward >12 cm safely (5")    From Standing Position, Pick up Object from Floor Able to pick up shoe safely and easily    From Standing Position, Turn to Look Behind Over each Shoulder Needs assist to keep from losing balance and falling    Turn 360 Degrees Able to turn 360 degrees safely but slowly  Standing Unsupported, Alternately Place Feet on Step/Stool Able to complete 4 steps without aid or supervision    Standing Unsupported, One Foot in Front Able to take small step independently and hold 30 seconds    Standing on One Leg Tries to lift leg/unable to hold 3 seconds but remains standing independently    Total Score 41      Timed Up and Go Test   Normal TUG (seconds) 15   sec with rollator (SBA)            PATIENT EDUCATION: Education details: Continue HEP/ do not attempt ambulating at home without AD Person educated: Patient Education  method: Explanation and Demonstration Education comprehension: verbalized understanding and needs further education  HOME EXERCISE PROGRAM: Access Code: [email or web address] URL: [email or web address] Date: 01/30/2023 Prepared by: Samuel [name]  Exercises - Sit to Stand  - 1 x daily - 7 x weekly - 3 sets - 10 reps - Corner Balance Feet Together With Eyes Open  - 1 x daily - 5 x weekly - 1 sets - 2-3 reps - 30 seconds hold - Wide Stance with Eyes Closed  - 1 x daily - 5 x weekly - 1 sets - 2-3 reps - 30 seconds hold - Standing Single Leg Stance with Counter Support  - 1 x daily - 5 x weekly - 1 sets - 2-3 reps - 30 seconds hold - Standing March with Counter Support  - 1 x daily - 5 x weekly - 2 sets - 20 reps  Reprinted on 03/07/2023 to have updated list for D/C  GOALS: Goals reviewed with patient? Yes  SHORT TERM GOALS: Target date: 02/06/2023  Patient will demonstrate 100% compliance with initial HEP to continue to progress between physical therapy sessions.     Baseline: Patient's reports exercise is going alright at home Goal status: MET  2. Patient will improve gait speed to 0.66 m/s with LRAD to progress towards PLOF and demonstrate progress towards a reduced risk for falls.  Baseline: 17.88" or 0.56 m/s with rollator + CGA; improved to 16.0" to 0.63 m/s with rollator; improved to 0.64 m/s Goal status: NOT MET  3.  Patient will improve TUG score to 16 seconds or less with LRAD to indicate clinically significant progress towards a decreased risk of falls and improved mobility.  Baseline: 19.79" with rollator + CGA-SBA; improved to 15" with rollator + CGA Goal status: MET  4.  Patient will improve their 5x Sit to Stand score to less than 15 seconds to demonstrate a decreased risk for falls and improved LE strength.   Baseline: 16.9" without UE use, WBOS (SBA); improved 15.09" without UE support (SBA); improved to 12.64" without UE support Goal status: MET  5.  Patient will  improve Berg Balance score by 5 points to indicate clinically significant progress towards a decreased risk of falls and improved static stability.   Baseline: 26/56; improved to 35 Goal status: MET   LONG TERM GOALS: Target date: 03/06/2023  Patient will report demonstrate independence with final HEP in order to maintain current gains and continue to progress after physical therapy discharge.   Baseline: Reports confidence in final HEP Goal status: MET  2.  Patient will improve gait speed to 0.70 m/s or less with LRAD to progress towards PLOF and demonstrate progress towards a reduced risk for falls.  Baseline: 17.88" or 0.56 m/s with rollator + CGA; improved to 16.0" to 0.63 m/s with rollator; improved to 0.64 m/s with rollator Goal status:  NOT MET  3. Patient will improve TUG score to 15 seconds or less with LRAD to indicate clinically significant progress towards a decreased risk of falls and improved mobility.  Baseline: 19.79" with rollator + CGA-SBA; improved to 15" with rollator + CGA Goal status: MET  4.  Patient will improve Berg Balance score by 10 points to indicate clinically significant progress towards a decreased risk of falls and improved static stability.   Baseline: 26/56; improved to 35/56; improved to 41/56 Goal status: MET   ASSESSMENT:  CLINICAL IMPRESSION: Patient is discharging from skilled physical therapy services due to achieving maximal rehab potential at this time. Patient achieved 6/8 goals with limiting factor being gait speed. However, though gait speed continues to put patient at increased risk for falls, patient safer ambulating at lower gait speed given unpredictability of LE movements. Patient demonstrated reduced risk for falls as indicated by TUG improvement and 5xSTS. Continues to require use of rollator for safety and educated for continued use at home. Recommend return to PT if patient notices a decline in function. Patient D/C with updated  HEP.  OBJECTIVE IMPAIRMENTS: Abnormal gait, decreased activity tolerance, decreased balance, decreased coordination, decreased knowledge of use of DME, decreased mobility, difficulty walking, decreased ROM, decreased strength, and decreased safety awareness.   ACTIVITY LIMITATIONS: carrying, bending, standing, squatting, and reach over head  PARTICIPATION LIMITATIONS: community activity, occupation, and yard work  PERSONAL FACTORS: Social background and 3+ comorbidities: see above  are also affecting patient's functional outcome.   REHAB POTENTIAL: Good  CLINICAL DECISION MAKING: Evolving/moderate complexity  EVALUATION COMPLEXITY: Moderate  PLAN:  PT FREQUENCY: 1x/week  PT DURATION: 8 weeks  PLANNED INTERVENTIONS: Therapeutic exercises, Therapeutic activity, Neuromuscular re-education, Balance training, Gait training, Patient/Family education, Self Care, DME instructions, Manual therapy, and Re-evaluation  PLAN FOR NEXT SESSION: modify HEP prn, rollator safety / training, cervical precautions, dynamic / static balance tasks with functional reaching, stepping to targets, Plan for D/C next session pending progress  Carmelia Bake, PT, DPT 03/07/2023, 11:48 AM  Check all possible CPT codes: 81191 - PT Re-evaluation, 97110- Therapeutic Exercise, [phone]- Neuro Re-education, [phone] - Gait Training, [phone] - Manual Therapy, [phone] - Therapeutic Activities, and [phone] - Self Care    Check all conditions that are expected to impact treatment: Musculoskeletal disorders, Neurological condition, and Social determinants of health   If treatment provided at initial evaluation, no treatment charged due to lack of authorization.      PHYSICAL THERAPY DISCHARGE SUMMARY  Visits from Start of Care: 7  Current functional level related to goals / functional outcomes: See above   Remaining deficits: Falls risk, requires use of rollator   Education / Equipment: Updated HEP, continue use with  rollator, return if notice decline in function   Patient agrees to discharge. Patient goals were partially met. Patient is being discharged due to maximized rehab potential.

## 2023-03-20 ENCOUNTER — Telehealth: Payer: Self-pay | Admitting: Orthopedic Surgery

## 2023-03-20 NOTE — Telephone Encounter (Signed)
Orthopedic Telephone Note  Patient is a 62 year old homeless male that I performed a C3/4 ACDF on for myelopathy.  He has not returned to the office for his planned 73-month follow-up.  I called the mobile phone number listed in his chart.  There was no answer and it was a male on the answering machine.  I called the home telephone number that was listed in the chart.  A male picked up the phone but said he was not Hilton Cork and did not know him.  The work number listed was also called.  A different male picked up at that telephone number.  He also said he was not Hilton Cork and did not know him.  Finally, the patient's Sister Angelique Blonder was called.  I left a voicemail on her answering machine trying to get in touch with him so that I could see him back in the office.  She did not return my call.  The patient does not have an email listed in his chart which is consistent with what he told me in the office when I signed him up for surgery.  London Sheer, MD Orthopedic Surgeon

## 2023-03-22 ENCOUNTER — Ambulatory Visit: Payer: No Typology Code available for payment source | Admitting: Orthopedic Surgery

## 2023-03-24 ENCOUNTER — Ambulatory Visit: Payer: No Typology Code available for payment source | Admitting: Orthopedic Surgery

## 2023-04-19 ENCOUNTER — Encounter: Payer: Medicaid Other | Attending: Registered Nurse | Admitting: Physical Medicine and Rehabilitation

## 2023-04-19 DIAGNOSIS — G959 Disease of spinal cord, unspecified: Secondary | ICD-10-CM | POA: Insufficient documentation

## 2023-04-24 ENCOUNTER — Other Ambulatory Visit: Payer: Self-pay

## 2023-04-24 ENCOUNTER — Ambulatory Visit (INDEPENDENT_AMBULATORY_CARE_PROVIDER_SITE_OTHER): Payer: Medicaid Other | Admitting: Orthopedic Surgery

## 2023-04-24 DIAGNOSIS — Z981 Arthrodesis status: Secondary | ICD-10-CM

## 2023-04-24 NOTE — Progress Notes (Signed)
Orthopedic Surgery Progress Note   Assessment: Patient is a 62 y.o. male who is as noted improvement in his balance and hand function after C3/4 ACDF Date of surgery: 12/16/2022 (~4 months post-op)     Plan: -Operative plans complete -No spine specific precautions -Okay to submerge wound -Pain control: OTC medications -Follow up in office in approximately 3 months, x-rays at next visit: AP/lateral/flex/ex cervical   _________________________________________________________________________   Subjective: Patient has been doing well since his last seen.  He is ambulating with a walker.  He is at home.  He is not having any pain radiating into his upper extremities.  He is not having any neck pain.  He is feels his strength has improved.  He is able to feed himself.  He is walking better.  Denies paresthesias and numbness.   Objective:   General: no acute distress, appropriate affect, ambulating with walker Neurologic: alert, answering questions appropriately, following commands Respiratory: unlabored breathing on room air Skin: Incision is well-healed   MSK (spine):   -Strength exam                                                   Left                  Right Grip strength                4+/5                4+/5 Interosseus                  4+/5                4+/5 Wrist extension            5/5                  5/5 Wrist flexion                 5/5                  5/5 Elbow extension           5/5                  5/5 Elbow flexion                5/5                  5/5 Deltoid                          5/5                  5/5   EHL                              4/5                  4/5 TA                                 4+/5                4+/5 GSC  4+/5                4+/5 Knee extension            5/5                  5/5 Knee flexion                 5/5                  5/5 Hip flexion                    5/5                  5/5   -Sensory  exam                           Sensation intact to light touch in L3-S1 nerve distributions of bilateral lower extremities             Sensation intact to light touch in C5-T1 nerve distributions of bilateral upper extremities (decreased distal to the wrist bilaterally in all distributions)      Imaging: XRs from 04/24/2023 were reviewed and independently interpreted, showing allograft interbody cage in the C3/4 disc space near the anterior aspect.  It appears to be in the same position as the post-operative x-rays on 12/29/2022.  Anterior instrumentation in place at C3 and C4 with no lucency around the screws.  Screws have not backed out.  Lucency around the cranial aspect of the allograft.     Patient name: Samuel [name] Patient MRN: [id number] Date: 04/24/23

## 2023-05-09 ENCOUNTER — Encounter: Payer: Self-pay | Admitting: *Deleted

## 2023-06-21 ENCOUNTER — Telehealth: Payer: Self-pay | Admitting: Orthopedic Surgery

## 2023-07-19 ENCOUNTER — Ambulatory Visit (INDEPENDENT_AMBULATORY_CARE_PROVIDER_SITE_OTHER): Payer: Medicaid Other | Admitting: Primary Care

## 2023-07-19 ENCOUNTER — Encounter (INDEPENDENT_AMBULATORY_CARE_PROVIDER_SITE_OTHER): Payer: Self-pay | Admitting: Primary Care

## 2023-07-19 VITALS — BP 123/76 | HR 57 | Resp 16 | Ht 65.0 in | Wt 98.6 lb

## 2023-07-19 DIAGNOSIS — Z5320 Procedure and treatment not carried out because of patient's decision for unspecified reasons: Secondary | ICD-10-CM | POA: Diagnosis not present

## 2023-07-19 DIAGNOSIS — R200 Anesthesia of skin: Secondary | ICD-10-CM | POA: Diagnosis not present

## 2023-07-19 DIAGNOSIS — Z2821 Immunization not carried out because of patient refusal: Secondary | ICD-10-CM

## 2023-07-19 DIAGNOSIS — F17210 Nicotine dependence, cigarettes, uncomplicated: Secondary | ICD-10-CM | POA: Diagnosis not present

## 2023-07-19 DIAGNOSIS — R202 Paresthesia of skin: Secondary | ICD-10-CM

## 2023-07-19 NOTE — Progress Notes (Signed)
Established Patient Office Visit  Subjective   Patient ID: Samuel [name]    DOB: Jul 06, 1961  Age: 62 y.o. MRN: [id number]  HPI  Mr.Samuel [name] is a 62 year old male who presents for follow up. Also admits to drinking beers(??) daily how many depends on the day. Informed writer this office is to far and would like to go to the clinic across the street from his house. Initially working up for malnutrition . Refused colon cancer screening , influenza and lung cancer screening he does not want to know when he is going to die. He uses a walker for unsteady gait and had cervical myelopathy  Dr .Christell Constant did C3/4 ACDF . He c/o numbness in fingers and hands. Patient has No headache, No chest pain, No abdominal pain - No Nausea, No new weakness tingling or numbness, No Cough - shortness of breath   Active Ambulatory Problems    Diagnosis Date Noted   Hyponatremia 07/12/2010   HYPOKALEMIA 07/28/2010   Alcohol abuse 07/12/2010   TOBACCO ABUSE 07/28/2010   COCAINE ABUSE 07/28/2010   NEUROPATHY 07/28/2010   Acute pancreatitis 07/12/2010   Transaminitis 09/11/2018   Pancreatic duct dilated 11/18/2018   Alcohol-induced chronic pancreatitis (HCC)    Abnormal LFTs    Pancreatic stones    Cervical myelopathy (HCC) 12/16/2022   Neurogenic bladder 12/22/2022   Malnutrition of moderate degree 12/23/2022   Acute blood loss anemia 01/02/2023   Resolved Ambulatory Problems    Diagnosis Date Noted   Pancreatitis 05/01/2015   Past Medical History:  Diagnosis Date   Coma (HCC)    Elevated LFTs    Gallbladder polyp    Renal cyst, left      ROS  Comprehensive ROS Pertinent positive and negative noted in HPI     Objective:   BP 123/76   Pulse (!) 57   Resp 16   Ht 5\' 5"  (1.651 m)   Wt 98 lb 9.6 oz (44.7 kg)   SpO2 100%   BMI 16.41 kg/m    Physical Exam Vitals reviewed.  Constitutional:      Comments: underweight  HENT:     Head: Normocephalic.     Right Ear: External ear normal.      Left Ear: External ear normal.     Nose: Nose normal.     Mouth/Throat:     Comments: Poor dentition  Eyes:     Extraocular Movements: Extraocular movements intact.  Cardiovascular:     Rate and Rhythm: Normal rate and regular rhythm.  Pulmonary:     Effort: Pulmonary effort is normal.     Breath sounds: Normal breath sounds.  Abdominal:     General: Bowel sounds are normal.     Palpations: Abdomen is soft.  Musculoskeletal:     Cervical back: Rigidity present.  Skin:    General: Skin is dry.  Neurological:     Mental Status: He is oriented to person, place, and time.  Psychiatric:        Mood and Affect: Mood normal.        Behavior: Behavior normal.      No results found for any visits on 08/18/22. Defer labs to new primary   The ASCVD Risk score (Arnett DK, et al., 2019) failed to calculate for the following reasons:   The systolic blood pressure is missing   Cannot find a previous HDL lab   Cannot find a previous total cholesterol lab    Assessment &  Plan:   Diagnoses and all orders for this visit:  Influenza vaccination declined  Herpes zoster vaccination declined  Lung cancer screening declined by patient 2/2 Smokes more than 2 packs a day with greater than 20 pack year history - I have recommended complete cessation of tobacco use. I have discussed various options available for assistance with tobacco cessation including over the counter methods (Nicotine gum, patch and lozenges). We also discussed prescription options (Chantix, Nicotine Inhaler / Nasal Spray). The patient is not interested in pursuing any prescription tobacco cessation options at this time. - Patient declines at this time.    Numbness and tingling in both hands 2/2 Numbness and tingling of both feet  Followed by ortho previous noted improvement after C3/4 ACDF   Patient transferring care to closer to home Grayce Sessions, NP

## 2023-07-26 ENCOUNTER — Ambulatory Visit: Payer: Medicaid Other | Admitting: Orthopedic Surgery

## 2023-07-26 ENCOUNTER — Other Ambulatory Visit (INDEPENDENT_AMBULATORY_CARE_PROVIDER_SITE_OTHER): Payer: Self-pay

## 2023-07-26 DIAGNOSIS — Z981 Arthrodesis status: Secondary | ICD-10-CM | POA: Diagnosis not present

## 2023-07-26 NOTE — Progress Notes (Signed)
Orthopedic Surgery Progress Note   Assessment: Patient is a 62 y.o. male who had some initial improvement in function after surgery but has not noticed any interval improvement in his function since he was last seen Date of surgery: 12/16/2022 (~7 months post-op)     Plan: -Operative plans complete -No spine specific precautions -Okay to submerge wound -Pain control: OTC medications -Went over the natural history of myelopathy and explained that surgery prevents progression but does not necessarily reverse any symptoms.  He has noticed some functional improvement but not any further since he was last seen.  I explained if he has worsening of function then he should return sooner than our planned follow-up to be reevaluated for possible adjacent segment disease causing myelopathy -Follow up in office in approximately 6 months, x-rays at next visit: AP/lateral/flex/ex cervical   _________________________________________________________________________   Subjective: Patient has not noticed any improvement in function since he was last seen in the office.  He still feels he is better hand function and balance on his lower extremities since surgery, but he still struggles with those issues.  He is using a walker to get around to help with his balance.  He still has decreased sensation in his hands and feet with difficulty in fine motor skills with his hands.  He is able to feed himself and can ambulate independently with a walker.  He has not noticed any redness or drainage around his incision.  He has no pain radiating to either upper extremity.  He is not having any neck pain.    Objective:   General: no acute distress, appropriate affect, ambulating with walker, cigarette smell noted Neurologic: alert, answering questions appropriately, following commands Respiratory: unlabored breathing on room air Skin: Incision is well-healed   MSK (spine):   -Strength exam                                                    Left                  Right Grip strength                4+/5                4+/5 Interosseus                  4+/5                4+/5 Wrist extension            5/5                  5/5 Wrist flexion                 5/5                  5/5 Elbow extension           5/5                  5/5 Elbow flexion                5/5                  5/5 Deltoid  5/5                  5/5   EHL                              4/5                  4/5 TA                                 4+/5                4+/5 GSC                             4+/5                4+/5 Knee extension            5/5                  5/5 Knee flexion                 5/5                  5/5 Hip flexion                    5/5                  5/5   -Sensory exam                           Sensation intact to light touch in L3-S1 nerve distributions of bilateral lower extremities (decreased distal to the mid shaft tibia in all distributions)             Sensation intact to light touch in C5-T1 nerve distributions of bilateral upper extremities (decreased distal to the wrist bilaterally in all distributions)      Imaging: XRs from 07/26/2023 were reviewed and independently interpreted, showing allograft interbody cage in the C3/4 disc space near the anterior aspect.  It appears to be in the same position as the post-operative x-rays on 12/29/2022.  There is still a lucent line at the cranial aspect of the allograft near the C3 inferior endplate.  There is about 1 mm of interspinous motion between flexion and extension views.  No lucency is seen around the screws and anterior instrumentation appears in place at C3 and C4.  No fracture or dislocation seen.    Patient name: Samuel [name] Patient MRN: [id number] Date: 07/26/23

## 2023-08-04 ENCOUNTER — Telehealth: Payer: Self-pay | Admitting: Orthopedic Surgery

## 2023-08-04 NOTE — Telephone Encounter (Signed)
Received call from Shatara, case manager w/ [employer]. She is checking status of a fax she sent over on 10/16 for personal care services, please callback [phone]

## 2023-08-04 NOTE — Telephone Encounter (Signed)
Refaxed form and note

## 2023-08-08 ENCOUNTER — Telehealth: Payer: Self-pay | Admitting: Orthopedic Surgery

## 2023-08-08 NOTE — Telephone Encounter (Signed)
Checking on the status of a form that was faxed over 07/26/23 from [place] with [place] [employer]. She stated 08/07/23 was the expiration date for the form to be completed. Best call back number [phone]

## 2023-08-09 NOTE — Telephone Encounter (Signed)
I called and lmom for Shandra to call me back with a fax number to her. I advised that I have faxed the forms 2 times with confirmation that they were rec'd.

## 2023-09-14 ENCOUNTER — Telehealth (INDEPENDENT_AMBULATORY_CARE_PROVIDER_SITE_OTHER): Payer: Self-pay | Admitting: Primary Care

## 2023-09-14 NOTE — Telephone Encounter (Signed)
Pt called requesting Viagra, says he has recently moved to [facility] and needs a phone call appt if he needs to be seen. Says he cannot come in to the office. Also says he does not know the name/address of the pharmacy that he wants this sent. Pt says he is going to call back.

## 2023-09-20 ENCOUNTER — Telehealth (INDEPENDENT_AMBULATORY_CARE_PROVIDER_SITE_OTHER): Payer: Medicaid Other | Admitting: Primary Care

## 2024-01-24 ENCOUNTER — Ambulatory Visit: Payer: Medicaid Other | Admitting: Orthopedic Surgery

## 2024-02-26 ENCOUNTER — Other Ambulatory Visit (INDEPENDENT_AMBULATORY_CARE_PROVIDER_SITE_OTHER): Payer: Self-pay

## 2024-02-26 ENCOUNTER — Ambulatory Visit: Admitting: Orthopedic Surgery

## 2024-02-26 DIAGNOSIS — Z981 Arthrodesis status: Secondary | ICD-10-CM

## 2024-02-26 NOTE — Progress Notes (Signed)
 Orthopedic Surgery Progress Note   Assessment: Patient is a 63 y.o. male who had some initial neurologic improvement after surgery but has not noticed any further improvement. There has been no decline or decrease in his function since he was last seen Date of surgery: 12/16/2022 (~14 months post-op)     Plan: -No spine precautions at this time -Pain control: OTC medications -Follow up in office on an as needed basis   _________________________________________________________________________   Subjective: Patient has been doing well since he was last seen in the office.  He has not noticed any worsening of his balance or hand function.  He is using regular utensils to feed himself.  He is walking with a walker.  He has not noticed any further neurologic improvement since he was last seen in the office.  He is not having any neck pain or pain rating into either upper extremity.      Objective:   General: no acute distress, appropriate affect, ambulating with walker Neurologic: alert, answering questions appropriately, following commands Respiratory: unlabored breathing on room air Skin: Incision is well-healed   MSK (spine):   -Strength exam                                                   Left                  Right Grip strength                4+/5                4+/5 Interosseus                  4+/5                4+/5 Wrist extension            5/5                  5/5 Wrist flexion                 5/5                  5/5 Elbow extension           5/5                  5/5 Elbow flexion                5/5                  5/5 Deltoid                          5/5                  5/5   EHL                              4/5                  4/5 TA                                 4+/5  4+/5 GSC                             4+/5                4+/5 Knee extension            5/5                  5/5 Knee flexion                 5/5                  5/5 Hip flexion                     5/5                  5/5   -Sensory exam                           Sensation intact to light touch in L3-S1 nerve distributions of bilateral lower extremities (decreased distal to the mid shaft tibia in all distributions)             Sensation intact to light touch in C5-T1 nerve distributions of bilateral upper extremities (decreased distal to the wrist bilaterally in all distributions)      Imaging: XRs of the cervical spine from 02/26/2024 were independently reviewed and interpreted, showing 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  There is anterior instrumentation from C3-C4.  No lucency seen around the screws.  None of the screws backed out.  No fracture or dislocation seen.  No translation seen on the flexion/extension views.     Patient name: Samuel [name] Patient MRN: [id number] Date: 02/26/24
